# Patient Record
Sex: Male | Born: 1945 | Race: White | Hispanic: No | Marital: Married | Smoking: Former smoker
Health system: Southern US, Community
[De-identification: ages and names within clinical notes are randomized; demographics above are authoritative.]

## PROBLEM LIST (undated history)

## (undated) DIAGNOSIS — Z87898 Personal history of other specified conditions: Secondary | ICD-10-CM

## (undated) DIAGNOSIS — F419 Anxiety disorder, unspecified: Secondary | ICD-10-CM

## (undated) DIAGNOSIS — Z9889 Other specified postprocedural states: Secondary | ICD-10-CM

## (undated) DIAGNOSIS — K219 Gastro-esophageal reflux disease without esophagitis: Secondary | ICD-10-CM

## (undated) DIAGNOSIS — I480 Paroxysmal atrial fibrillation: Secondary | ICD-10-CM

## (undated) DIAGNOSIS — I10 Essential (primary) hypertension: Secondary | ICD-10-CM

## (undated) DIAGNOSIS — R296 Repeated falls: Secondary | ICD-10-CM

## (undated) DIAGNOSIS — E119 Type 2 diabetes mellitus without complications: Secondary | ICD-10-CM

## (undated) DIAGNOSIS — E786 Lipoprotein deficiency: Secondary | ICD-10-CM

## (undated) DIAGNOSIS — N529 Male erectile dysfunction, unspecified: Secondary | ICD-10-CM

## (undated) DIAGNOSIS — T7840XA Allergy, unspecified, initial encounter: Secondary | ICD-10-CM

## (undated) DIAGNOSIS — R519 Headache, unspecified: Secondary | ICD-10-CM

## (undated) DIAGNOSIS — Z9221 Personal history of antineoplastic chemotherapy: Secondary | ICD-10-CM

## (undated) DIAGNOSIS — R718 Other abnormality of red blood cells: Secondary | ICD-10-CM

## (undated) DIAGNOSIS — E559 Vitamin D deficiency, unspecified: Secondary | ICD-10-CM

## (undated) DIAGNOSIS — E2740 Unspecified adrenocortical insufficiency: Secondary | ICD-10-CM

## (undated) DIAGNOSIS — Z9989 Dependence on other enabling machines and devices: Secondary | ICD-10-CM

## (undated) DIAGNOSIS — E271 Primary adrenocortical insufficiency: Secondary | ICD-10-CM

## (undated) DIAGNOSIS — G4730 Sleep apnea, unspecified: Secondary | ICD-10-CM

## (undated) DIAGNOSIS — H25019 Cortical age-related cataract, unspecified eye: Secondary | ICD-10-CM

## (undated) DIAGNOSIS — E781 Pure hyperglyceridemia: Secondary | ICD-10-CM

## (undated) DIAGNOSIS — H9190 Unspecified hearing loss, unspecified ear: Secondary | ICD-10-CM

## (undated) DIAGNOSIS — C649 Malignant neoplasm of unspecified kidney, except renal pelvis: Secondary | ICD-10-CM

## (undated) DIAGNOSIS — I499 Cardiac arrhythmia, unspecified: Secondary | ICD-10-CM

## (undated) DIAGNOSIS — E291 Testicular hypofunction: Secondary | ICD-10-CM

## (undated) DIAGNOSIS — Z8639 Personal history of other endocrine, nutritional and metabolic disease: Secondary | ICD-10-CM

## (undated) HISTORY — DX: Cardiac arrhythmia, unspecified: I49.9

## (undated) HISTORY — DX: Anxiety disorder, unspecified: F41.9

## (undated) HISTORY — DX: Personal history of other endocrine, nutritional and metabolic disease: Z86.39

## (undated) HISTORY — PX: THYROIDECTOMY, PARTIAL: SHX18

## (undated) HISTORY — DX: Sleep apnea, unspecified: G47.30

## (undated) HISTORY — DX: Gastro-esophageal reflux disease without esophagitis: K21.9

## (undated) HISTORY — DX: Lipoprotein deficiency: E78.6

## (undated) HISTORY — DX: Repeated falls: R29.6

## (undated) HISTORY — PX: TONSILLECTOMY: SUR1361

## (undated) HISTORY — DX: Type 2 diabetes mellitus without complications: E11.9

## (undated) HISTORY — DX: Malignant neoplasm of unspecified kidney, except renal pelvis: C64.9

## (undated) HISTORY — DX: Allergy, unspecified, initial encounter: T78.40XA

## (undated) HISTORY — DX: Essential (primary) hypertension: I10

## (undated) HISTORY — DX: Dependence on other enabling machines and devices: Z99.89

## (undated) HISTORY — PX: ADRENALECTOMY: SHX876

## (undated) HISTORY — DX: Personal history of antineoplastic chemotherapy: Z92.21

## (undated) HISTORY — DX: Vitamin D deficiency, unspecified: E55.9

## (undated) HISTORY — PX: COLONOSCOPY W/ BIOPSIES: SHX1374

## (undated) HISTORY — DX: Primary adrenocortical insufficiency: E27.1

## (undated) HISTORY — DX: Other abnormality of red blood cells: R71.8

## (undated) HISTORY — DX: Paroxysmal atrial fibrillation: I48.0

## (undated) HISTORY — DX: Other specified postprocedural states: Z98.890

## (undated) HISTORY — PX: CYSTECTOMY: SUR359

## (undated) HISTORY — DX: Pure hyperglyceridemia: E78.1

## (undated) HISTORY — DX: Male erectile dysfunction, unspecified: N52.9

## (undated) HISTORY — DX: Unspecified adrenocortical insufficiency: E27.40

## (undated) HISTORY — DX: Testicular hypofunction: E29.1

## (undated) HISTORY — DX: Unspecified hearing loss, unspecified ear: H91.90

## (undated) HISTORY — DX: Cortical age-related cataract, unspecified eye: H25.019

## (undated) HISTORY — DX: Personal history of other specified conditions: Z87.898

## (undated) HISTORY — PX: POPLITEAL SYNOVIAL CYST EXCISION: SUR555

## (undated) HISTORY — PX: NEPHRECTOMY: SHX65

## (undated) HISTORY — PX: THYROIDECTOMY: SHX17

---

## 1987-10-02 HISTORY — PX: SPINE SURGERY: SHX786

## 2010-11-10 DIAGNOSIS — C7490 Malignant neoplasm of unspecified part of unspecified adrenal gland: Secondary | ICD-10-CM

## 2010-11-10 HISTORY — DX: Malignant neoplasm of unspecified part of unspecified adrenal gland: C74.90

## 2010-11-30 DIAGNOSIS — E039 Hypothyroidism, unspecified: Secondary | ICD-10-CM | POA: Insufficient documentation

## 2011-11-13 DIAGNOSIS — N183 Chronic kidney disease, stage 3 unspecified: Secondary | ICD-10-CM

## 2011-11-13 HISTORY — DX: Chronic kidney disease, stage 3 unspecified: N18.30

## 2013-09-04 DIAGNOSIS — E782 Mixed hyperlipidemia: Secondary | ICD-10-CM

## 2013-09-04 DIAGNOSIS — M109 Gout, unspecified: Secondary | ICD-10-CM

## 2013-09-04 HISTORY — DX: Gout, unspecified: M10.9

## 2013-09-04 HISTORY — DX: Mixed hyperlipidemia: E78.2

## 2013-10-15 DIAGNOSIS — R054 Cough syncope: Secondary | ICD-10-CM

## 2013-10-15 DIAGNOSIS — J069 Acute upper respiratory infection, unspecified: Secondary | ICD-10-CM | POA: Insufficient documentation

## 2013-10-15 DIAGNOSIS — Z8709 Personal history of other diseases of the respiratory system: Secondary | ICD-10-CM

## 2013-10-15 DIAGNOSIS — R55 Syncope and collapse: Secondary | ICD-10-CM

## 2013-10-15 HISTORY — DX: Syncope and collapse: R55

## 2013-10-15 HISTORY — DX: Personal history of other diseases of the respiratory system: Z87.09

## 2013-10-15 HISTORY — DX: Cough syncope: R05.4

## 2014-03-29 DIAGNOSIS — R531 Weakness: Secondary | ICD-10-CM

## 2014-03-29 DIAGNOSIS — R059 Cough, unspecified: Secondary | ICD-10-CM

## 2014-03-29 DIAGNOSIS — R509 Fever, unspecified: Secondary | ICD-10-CM

## 2014-03-29 HISTORY — DX: Cough, unspecified: R05.9

## 2014-03-29 HISTORY — DX: Weakness: R53.1

## 2014-03-29 HISTORY — DX: Fever, unspecified: R50.9

## 2014-04-14 DIAGNOSIS — E039 Hypothyroidism, unspecified: Secondary | ICD-10-CM

## 2014-04-14 HISTORY — DX: Hypothyroidism, unspecified: E03.9

## 2014-04-19 DIAGNOSIS — E272 Addisonian crisis: Secondary | ICD-10-CM | POA: Insufficient documentation

## 2014-05-17 DIAGNOSIS — Z8639 Personal history of other endocrine, nutritional and metabolic disease: Secondary | ICD-10-CM

## 2014-05-17 HISTORY — DX: Personal history of other endocrine, nutritional and metabolic disease: Z86.39

## 2014-06-16 DIAGNOSIS — E785 Hyperlipidemia, unspecified: Secondary | ICD-10-CM

## 2014-06-16 HISTORY — DX: Hyperlipidemia, unspecified: E78.5

## 2014-10-01 HISTORY — PX: CATARACT EXTRACTION: SUR2

## 2014-10-20 DIAGNOSIS — I4891 Unspecified atrial fibrillation: Secondary | ICD-10-CM | POA: Insufficient documentation

## 2014-10-20 DIAGNOSIS — I499 Cardiac arrhythmia, unspecified: Secondary | ICD-10-CM

## 2014-10-20 HISTORY — DX: Cardiac arrhythmia, unspecified: I49.9

## 2015-06-16 DIAGNOSIS — C649 Malignant neoplasm of unspecified kidney, except renal pelvis: Secondary | ICD-10-CM

## 2015-06-16 HISTORY — DX: Malignant neoplasm of unspecified kidney, except renal pelvis: C64.9

## 2015-10-04 DIAGNOSIS — C7802 Secondary malignant neoplasm of left lung: Secondary | ICD-10-CM | POA: Insufficient documentation

## 2015-10-04 DIAGNOSIS — Z859 Personal history of malignant neoplasm, unspecified: Secondary | ICD-10-CM

## 2015-10-04 HISTORY — DX: Personal history of malignant neoplasm, unspecified: Z85.9

## 2016-07-24 DIAGNOSIS — G4733 Obstructive sleep apnea (adult) (pediatric): Secondary | ICD-10-CM | POA: Insufficient documentation

## 2016-07-24 DIAGNOSIS — G2581 Restless legs syndrome: Secondary | ICD-10-CM

## 2016-07-24 HISTORY — DX: Obstructive sleep apnea (adult) (pediatric): G47.33

## 2016-07-24 HISTORY — DX: Restless legs syndrome: G25.81

## 2016-11-05 HISTORY — PX: CATARACT EXTRACTION EXTRACAPSULAR: SHX1305

## 2016-11-23 DIAGNOSIS — M7989 Other specified soft tissue disorders: Secondary | ICD-10-CM

## 2016-11-23 HISTORY — DX: Other specified soft tissue disorders: M79.89

## 2017-03-07 DIAGNOSIS — M1A00X0 Idiopathic chronic gout, unspecified site, without tophus (tophi): Secondary | ICD-10-CM | POA: Insufficient documentation

## 2017-03-07 DIAGNOSIS — R600 Localized edema: Secondary | ICD-10-CM

## 2017-03-07 DIAGNOSIS — Z8679 Personal history of other diseases of the circulatory system: Secondary | ICD-10-CM

## 2017-03-07 HISTORY — DX: Localized edema: R60.0

## 2017-03-07 HISTORY — DX: Idiopathic chronic gout, unspecified site, without tophus (tophi): M1A.00X0

## 2017-03-07 HISTORY — DX: Personal history of other diseases of the circulatory system: Z86.79

## 2017-05-06 HISTORY — PX: COLONOSCOPY: SHX174

## 2017-10-28 DIAGNOSIS — D369 Benign neoplasm, unspecified site: Secondary | ICD-10-CM

## 2017-10-28 HISTORY — DX: Benign neoplasm, unspecified site: D36.9

## 2018-01-06 DIAGNOSIS — Z872 Personal history of diseases of the skin and subcutaneous tissue: Secondary | ICD-10-CM | POA: Insufficient documentation

## 2018-01-06 HISTORY — DX: Personal history of diseases of the skin and subcutaneous tissue: Z87.2

## 2018-08-01 DIAGNOSIS — K8689 Other specified diseases of pancreas: Secondary | ICD-10-CM | POA: Insufficient documentation

## 2018-08-01 HISTORY — DX: Other specified diseases of pancreas: K86.89

## 2019-08-11 ENCOUNTER — Other Ambulatory Visit
Admission: RE | Admit: 2019-08-11 | Discharge: 2019-08-11 | Disposition: A | Discharge status: Home / Self Care | Payer: Medicare Other | Source: Ambulatory Visit | Attending: Neurology | Admitting: Neurology

## 2019-08-11 ENCOUNTER — Other Ambulatory Visit: Payer: Self-pay

## 2019-08-11 DIAGNOSIS — Z20828 Contact with and (suspected) exposure to other viral communicable diseases: Secondary | ICD-10-CM | POA: Insufficient documentation

## 2019-08-11 DIAGNOSIS — Z01812 Encounter for preprocedural laboratory examination: Secondary | ICD-10-CM | POA: Insufficient documentation

## 2019-08-11 LAB — SARS CORONAVIRUS 2 (TAT 6-24 HRS): SARS Coronavirus 2: NEGATIVE

## 2019-08-21 ENCOUNTER — Other Ambulatory Visit: Payer: Self-pay

## 2019-08-21 DIAGNOSIS — Z20822 Contact with and (suspected) exposure to covid-19: Secondary | ICD-10-CM

## 2019-08-23 LAB — NOVEL CORONAVIRUS, NAA: SARS-CoV-2, NAA: NOT DETECTED

## 2019-09-07 ENCOUNTER — Other Ambulatory Visit
Admission: RE | Admit: 2019-09-07 | Discharge: 2019-09-07 | Disposition: A | Discharge status: Home / Self Care | Payer: Medicare Other | Source: Ambulatory Visit | Attending: Neurology | Admitting: Neurology

## 2019-09-07 ENCOUNTER — Other Ambulatory Visit: Payer: Self-pay

## 2019-09-07 DIAGNOSIS — Z01812 Encounter for preprocedural laboratory examination: Secondary | ICD-10-CM | POA: Diagnosis present

## 2019-09-07 DIAGNOSIS — Z20828 Contact with and (suspected) exposure to other viral communicable diseases: Secondary | ICD-10-CM | POA: Insufficient documentation

## 2019-09-08 LAB — SARS CORONAVIRUS 2 (TAT 6-24 HRS): SARS Coronavirus 2: NEGATIVE

## 2019-09-10 ENCOUNTER — Ambulatory Visit: Attending: Neurology

## 2019-09-10 DIAGNOSIS — G4733 Obstructive sleep apnea (adult) (pediatric): Secondary | ICD-10-CM | POA: Insufficient documentation

## 2019-09-28 ENCOUNTER — Other Ambulatory Visit: Payer: Self-pay

## 2019-11-20 LAB — PSA: PSA: 1.26

## 2020-01-12 ENCOUNTER — Other Ambulatory Visit: Payer: Self-pay

## 2020-01-12 ENCOUNTER — Encounter: Payer: Self-pay | Admitting: Nurse Practitioner

## 2020-01-12 ENCOUNTER — Ambulatory Visit: Payer: Medicare Other | Admitting: Nurse Practitioner

## 2020-01-12 VITALS — BP 118/82 | HR 80 | Temp 97.6°F | Resp 16 | Ht 68.0 in | Wt 203.0 lb

## 2020-01-12 DIAGNOSIS — F419 Anxiety disorder, unspecified: Secondary | ICD-10-CM

## 2020-01-12 DIAGNOSIS — M109 Gout, unspecified: Secondary | ICD-10-CM

## 2020-01-12 DIAGNOSIS — H3530 Unspecified macular degeneration: Secondary | ICD-10-CM | POA: Diagnosis not present

## 2020-01-12 DIAGNOSIS — E1149 Type 2 diabetes mellitus with other diabetic neurological complication: Secondary | ICD-10-CM | POA: Diagnosis not present

## 2020-01-12 DIAGNOSIS — Z9989 Dependence on other enabling machines and devices: Secondary | ICD-10-CM

## 2020-01-12 DIAGNOSIS — K5904 Chronic idiopathic constipation: Secondary | ICD-10-CM

## 2020-01-12 DIAGNOSIS — K219 Gastro-esophageal reflux disease without esophagitis: Secondary | ICD-10-CM

## 2020-01-12 DIAGNOSIS — E2740 Unspecified adrenocortical insufficiency: Secondary | ICD-10-CM

## 2020-01-12 DIAGNOSIS — E559 Vitamin D deficiency, unspecified: Secondary | ICD-10-CM

## 2020-01-12 DIAGNOSIS — C642 Malignant neoplasm of left kidney, except renal pelvis: Secondary | ICD-10-CM

## 2020-01-12 DIAGNOSIS — G4733 Obstructive sleep apnea (adult) (pediatric): Secondary | ICD-10-CM

## 2020-01-12 DIAGNOSIS — E890 Postprocedural hypothyroidism: Secondary | ICD-10-CM

## 2020-01-12 DIAGNOSIS — E538 Deficiency of other specified B group vitamins: Secondary | ICD-10-CM

## 2020-01-12 NOTE — Progress Notes (Signed)
Careteam: Patient Care Team: [name], NP as PCP - General (Geriatric Medicine) [name], MD as Physician Assistant (Endocrinology) [name], MD as Referring Physician (Neurology)  Advanced Directive information Does Patient Have a Medical Advance Directive?: Yes, Type of Advance Directive: Out of facility DNR (pink MOST or yellow form);Living will;Healthcare Power of Attorney, Does patient want to make changes to medical advance directive?: No - Patient declined  Allergies  Allergen Reactions  . Other Rash    Blisters with bandaids Blisters with bandaids   . Soap Itching    Any Deodorant Soap.  . Tegaderm Ag Mesh [Silver]   . Tape Rash    Blisters, rash. Paper tape OK. Blisters, rash. Paper tape OK.     Chief Complaint  Patient presents with  . Establish Care    New Patient      HPI: Patient is a 74 y.o. male seen in today at the [facility] to establish care. He is working closely with hospice but would like to establish care at [facility] for convenience for any primary care needs.   Pt with hx of renal cancer found in 2011, also was in adrenal glands and 2012 then noted to have in lung (opted not to have surgery) and more recently found in pancrease (opted not to have surgery) - he had scans done but is not having anymore surgery Did chemo in 2011 and 2012 but did not tolerate it. When he decided to get on hospice he decided not to see oncologist so after the move to [facility] he no longer following with oncologist   Reports he now has a pain in his left arm and suspects this is due to mets to the bone. It is a  dull ache in arm (started after vaccine) takes tylenol when it bothers him- reports pain is all the time. Reports pain from shoulder down arm to elbow. Can press on the bone and it hurts. Taking tylenol 1000 mg TID.   Has been a part of hospice since prior to move in June 2020    VIt d Def- on Vit d 5000 units daily  Gout-  one flare this year, uses colchicine PRN only. 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 Also takes cortef alternate dosing.   Edema- taking lasix 20 mg PRN, uses rarely.  Neuropathy- using 900 mg qhs  OSA- uses CPAP followed by neurologist.   Hypothyroid, post surgery, on synthroid 75 mcg   Muscle cramps- taking magnesium which has been beneficial   DM- on metformin 500 mg daily. Blood sugars are controlled.  No hypoglycemia.   GERD- omeprazole- stopped this week.   Macular degeneration- advance in left eye. Uses preservision.   Constipation- controlled on psyllium   Hyperlipidemia- does not wish to monitor or treat at this time.    Review of Systems:  Review of Systems  Constitutional: Negative for chills, fever and malaise/fatigue.  HENT: Positive for hearing loss.   Eyes: Positive for blurred vision (macular degeneration).  Respiratory: Negative for cough, sputum production and shortness of breath.   Cardiovascular: Negative for chest pain, claudication and leg swelling.  Gastrointestinal: Negative for constipation, diarrhea and heartburn.  Genitourinary: Positive for urgency. Negative for dysuria and frequency.  Musculoskeletal: Positive for myalgias. Negative  for falls.  Skin: Negative.   Neurological: Positive for tingling, sensory change and weakness (uses cane). Negative for dizziness and headaches.  Psychiatric/Behavioral: Negative for depression. The patient is not nervous/anxious.     Past Medical History:  Diagnosis Date  . Abnormal heart rhythm 10/20/2014  . Addison's disease ([place])   . Adrenal insufficiency ([place])   . Allergy   . Ambulates with cane   . Anxiety   . Arrhythmia   . Cataract cortical, senile   . 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  . Chronic kidney  disease, stage 3 11/13/2011  . Cough 03/29/2014  . Edema leg 03/07/2017  . Elevated cholesterol with high triglycerides 09/04/2013  . Elevated red blood cell count   . Erectile dysfunction   . Falls frequently   . Fever 03/29/2014  . Generalized weakness 03/29/2014  . GERD (gastroesophageal reflux disease)   . Gout 09/04/2013  . H/O adrenal insufficiency 05/17/2014  . H/O atrial flutter 03/07/2017  . H/O atrial flutter   . H/O eye surgery   . H/O sebaceous cyst 01/06/2018  . H/O thyroid disease   . H/O upper respiratory infection 10/15/2013  . Hearing loss   . History of back surgery   . History of blepharoplasty   . History of cancer 10/04/2015  . History of chemotherapy   . History of esophagogastroduodenoscopy (EGD)   . History of prediabetes   . Hyperlipidemia 06/16/2014  . Hypertension   . Hypertriglyceridemia   . Hypothyroidism (acquired) 04/14/2014  . Left leg swelling 11/23/2016  . Low HDL (under 40)   . 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[place]) 11/10/2010  . Malignant neoplasm of unspecified kidney, except renal pelvis ([place])   . OSA (obstructive sleep apnea) 07/24/2016  . OSA (obstructive sleep apnea)   . PAF (paroxysmal atrial fibrillation) ([place])   . PAF (paroxysmal atrial fibrillation) ([place])   . Pancreatic mass 08/01/2018  . Renal carcinoma ([place]) 06/16/2015  . RLS (restless legs syndrome) 07/24/2016  . Sleep apnea   . Testicular hypofunction   . Tubular adenoma 10/28/2017  . Tussive syncopes 10/15/2013  . Type 2 diabetes mellitus ([place])   . Vitamin D deficiency disease    Past Surgical History:  Procedure Laterality Date  . ADRENALECTOMY    . CATARACT EXTRACTION  10/2014  . CATARACT EXTRACTION EXTRACAPSULAR  11/05/2016   with Insertion Intraocular Prostheis.   . COLONOSCOPY  05/06/2017   with removal lesions by snare.  . COLONOSCOPY W/ BIOPSIES     05/06/2017  . NEPHRECTOMY    . POPLITEAL SYNOVIAL CYST EXCISION    . [place]  .  THYROIDECTOMY    . THYROIDECTOMY, PARTIAL    . TONSILLECTOMY     Social History:   reports that he quit smoking about 43 years ago. His smoking use included cigarettes. He has a 10.00 pack-year smoking history. He does not have any smokeless tobacco history on file. He reports previous alcohol use. He reports that he does not use drugs.  Family History  Problem Relation Age of Onset  . Ovarian cancer Mother   . Cancer Mother   . Cancer Father   . Heart disease Father   . Coronary artery disease Father   . Hypertension Father   . Macular degeneration Father   . Heart disease Brother   . Diabetes Brother   . Coronary artery disease Brother   . Diabetes type II Brother   . Coronary artery disease Paternal Grandfather  Medications: Patient's Medications  New Prescriptions   No medications on file  Previous Medications   ACCU-CHEK SOFTCLIX LANCETS LANCETS    1 each by Other route daily. Use as instructed   BENZONATATE (TESSALON) 100 MG CAPSULE    Take 100 mg by mouth 3 (three) times daily as needed for cough.   BLOOD GLUCOSE METER KIT AND SUPPLIES    1 each by Other route as directed. Dispense based on patient and insurance preference. Use up to four times daily as directed. (FOR ICD-10 E10.9, E11.9).   CHOLECALCIFEROL 125 MCG (5000 UT) CAPSULE    Take 5,000 Units by mouth daily.   COLCHICINE 0.6 MG TABLET    Take 0.6 mg by mouth as needed. First sign of Gout Flare. Take 2 tablets ( 1.9m) by mouth at first sign of gout flare followed by 1 tablet ( 0.6759m after 1 hour. ( max 1.59m63mithin 1 hours)   CYANOCOBALAMIN ([employer] VITAMIN B12) 1000 MCG TABLET    Take 1,000 mcg by mouth every other day.   FLUDROCORTISONE ACETATE (FLORINEF PO)    Take 0.1 mg by mouth every other day. On alternate days take 1/2 tablet.   FUROSEMIDE (LASIX) 20 MG TABLET    Take 20 mg by mouth as needed. For Edema.   GABAPENTIN (NEURONTIN) 600 MG TABLET    Take 900 mg by mouth daily. Takes 1 and 1/2 tablet by mouth  nightly.   GLUCOSE BLOOD (ACCU-CHEK AVIVA PLUS) TEST STRIP    1 each by Other route daily. Use as instructed   HYDROCORTISONE (CORTEF) 10 MG TABLET    Take 10 mg by mouth daily. Takes 1 and 1/2 tablet every morning, 1/2 tablet every afternoon, and 1/2 tablet every evening. Take double dose as directed for stress, may take up to 85 tablets monthly.   LEVOTHYROXINE (SYNTHROID) 75 MCG TABLET    Take 75 mcg by mouth daily.   LORAZEPAM (ATIVAN) 0.5 MG TABLET    Take 0.5 mg by mouth every 8 (eight) hours.   MAGNESIUM 100 MG TABS    Take 100 mg by mouth at bedtime.   METFORMIN (GLUMETZA) 500 MG (MOD) 24 HR TABLET    Take 500 mg by mouth daily.   MULTIPLE VITAMINS-MINERALS (PRESERVISION AREDS 2 PO)    Take 1 capsule by mouth in the morning and at bedtime.   OMEPRAZOLE (PRILOSEC) 20 MG CAPSULE    Take 20 mg by mouth in the morning and at bedtime.   PSYLLIUM HUSK PO    Take 3.4 g by mouth every evening.  Modified Medications   No medications on file  Discontinued Medications   ALBUTEROL (VENTOLIN HFA) 108 (90 BASE) MCG/ACT INHALER    Inhale 2 puffs into the lungs every 6 (six) hours as needed for wheezing or shortness of breath.    Physical Exam:  Vitals:   01/12/20 0902  BP: 118/82  Pulse: 80  Resp: 16  Temp: 97.6 F (36.4 C)  SpO2: 96%  Weight: 203 lb (92.1 kg)  Height: '5\' 8"'  (1.727 m)   Body mass index is 30.87 kg/m. Wt Readings from Last 3 Encounters:  01/12/20 203 lb (92.1 kg)    Physical Exam Constitutional:      General: He is not in acute distress.    Appearance: He is well-developed. He is not diaphoretic.  HENT:     Head: Normocephalic and atraumatic.     Mouth/Throat:     Pharynx: No oropharyngeal exudate.  Eyes:  Conjunctiva/sclera: Conjunctivae normal.     Pupils: Pupils are equal, round, and reactive to light.  Cardiovascular:     Rate and Rhythm: Normal rate and regular rhythm.     Heart sounds: Normal heart sounds.  Pulmonary:     Effort: Pulmonary effort is  normal.     Breath sounds: Normal breath sounds.  Abdominal:     General: Bowel sounds are normal.     Palpations: Abdomen is soft.  Musculoskeletal:        General: No tenderness.     Cervical back: Normal range of motion and neck supple.  Skin:    General: Skin is warm and dry.  Neurological:     Mental Status: He is alert and oriented to person, place, and time.     Labs reviewed: Basic Metabolic Panel: No results for input(s): NA, K, CL, CO2, GLUCOSE, BUN, CREATININE, CALCIUM, MG, PHOS, TSH in the last 8760 hours. Liver Function Tests: No results for input(s): AST, ALT, ALKPHOS, BILITOT, PROT, ALBUMIN in the last 8760 hours. No results for input(s): LIPASE, AMYLASE in the last 8760 hours. No results for input(s): AMMONIA in the last 8760 hours. CBC: No results for input(s): WBC, NEUTROABS, HGB, HCT, MCV, PLT in the last 8760 hours. Lipid Panel: No results for input(s): CHOL, HDL, LDLCALC, TRIG, CHOLHDL, LDLDIRECT in the last 8760 hours. TSH: No results for input(s): TSH in the last 8760 hours. A1C: No results found for: HGBA1C   Assessment/Plan 1. Adrenal insufficiency (HCC) Due to bilateral adrenalectomy from cancer, following with endocrinologist for supplement  2. 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 No longer seeing an oncologist and followed by hospice at this time.   3. Type 2 diabetes mellitus with neurological complications ([place]) -followed by endocrinology, A1c well controled on on metformin. Continues on gabapenin due to neuropathy.  4. Macular degeneration of both eyes, unspecified type -continues with preservision. Advanced in left eye  5. Anxiety Had been prescribed lorazepam but only used for a short time. Now anxiety controlled and not needing any medication.   6. Postoperative hypothyroidism -TSH in normal range, followed by endocrinology, continues on levothyroxine.  7.  Gout, unspecified cause, unspecified chronicity, unspecified site Stable, diet controlled. Has PRN colchicine if needed.   8. OSA on CPAP Stable, Going to neurology for CPAP supplies.   9. Vitamin D deficiency Continues on vit d supplement.  10. Vitamin B12 deficiency Continues on b12 supplement.  11. Gastroesophageal reflux disease without esophagitis Has stopped omeprazole and without worsening of symptoms. Will monitor.   12. Chronic idiopathic constipation Controlled with OTC.   Next appt: 3 months, PRN Carlos American. [place], [facility] [phone]

## 2020-01-14 DIAGNOSIS — Z9181 History of falling: Secondary | ICD-10-CM | POA: Insufficient documentation

## 2020-01-14 DIAGNOSIS — G479 Sleep disorder, unspecified: Secondary | ICD-10-CM | POA: Insufficient documentation

## 2020-01-14 DIAGNOSIS — R262 Difficulty in walking, not elsewhere classified: Secondary | ICD-10-CM | POA: Insufficient documentation

## 2020-01-19 ENCOUNTER — Telehealth: Payer: Self-pay

## 2020-01-19 NOTE — Telephone Encounter (Signed)
Tony Huffman said thank you for being Tony Huffman' attendee and she will be sending you all of his notes.

## 2020-01-19 NOTE — Telephone Encounter (Signed)
Yes - thank you

## 2020-01-19 NOTE — Telephone Encounter (Signed)
Mr. Malvin Johns is in the [facility] and they Jan)  would like to know if you would  agree to be Mr. Borden attending NP .Marland Kitchen... Jan [phone]

## 2020-02-19 ENCOUNTER — Telehealth: Payer: Self-pay

## 2020-02-19 NOTE — Telephone Encounter (Signed)
Tony Huffman said she would have their physicians manage his pain meds.

## 2020-02-19 NOTE — Telephone Encounter (Signed)
I know hospice sometimes does pain management? Is this something they can manage on him? If not I will call in tramadol and have him follow up with me in clinic

## 2020-02-19 NOTE — Telephone Encounter (Signed)
Jan with [facility] [phone]) called about this patient. She stated that patient is having increased pain in his left arm that he feels is bony metastasis. She said he has maxed out on his daily Tylenol use and wants to know if you can prescribe tramadol or something else for pain.

## 2020-02-19 NOTE — Telephone Encounter (Signed)
Great - thanks

## 2020-04-26 ENCOUNTER — Encounter: Payer: Self-pay | Admitting: Nurse Practitioner

## 2020-04-26 ENCOUNTER — Ambulatory Visit: Payer: Medicare Other | Admitting: Nurse Practitioner

## 2020-04-26 ENCOUNTER — Other Ambulatory Visit: Payer: Self-pay

## 2020-04-26 VITALS — BP 100/70 | HR 76 | Temp 97.7°F | Ht 68.0 in | Wt 200.0 lb

## 2020-04-26 DIAGNOSIS — E2740 Unspecified adrenocortical insufficiency: Secondary | ICD-10-CM | POA: Diagnosis not present

## 2020-04-26 DIAGNOSIS — M109 Gout, unspecified: Secondary | ICD-10-CM

## 2020-04-26 DIAGNOSIS — Z9989 Dependence on other enabling machines and devices: Secondary | ICD-10-CM

## 2020-04-26 DIAGNOSIS — E1149 Type 2 diabetes mellitus with other diabetic neurological complication: Secondary | ICD-10-CM

## 2020-04-26 DIAGNOSIS — F419 Anxiety disorder, unspecified: Secondary | ICD-10-CM | POA: Diagnosis not present

## 2020-04-26 DIAGNOSIS — G4733 Obstructive sleep apnea (adult) (pediatric): Secondary | ICD-10-CM

## 2020-04-26 DIAGNOSIS — E890 Postprocedural hypothyroidism: Secondary | ICD-10-CM

## 2020-04-26 DIAGNOSIS — C642 Malignant neoplasm of left kidney, except renal pelvis: Secondary | ICD-10-CM

## 2020-04-26 NOTE — Progress Notes (Signed)
[facility]: Patient Care Team: [name], NP as PCP - General (Geriatric Medicine) [name], MD as Physician Assistant (Endocrinology) [name], MD as Referring Physician (Neurology)  Advanced Directive information    Allergies  Allergen Reactions  . Other Rash    Blisters with bandaids Blisters with bandaids   . Soap Itching    Any Deodorant Soap.  . Tegaderm Ag Mesh [Silver]   . Tape Rash    Blisters, rash. Paper tape OK. Blisters, rash. Paper tape OK.     Chief Complaint  Patient presents with  . Follow-up    3 month follow up. Patient states right arm went numb about a week ago.Patient did not go to ER or urgent care. He states it seemed to get better immediately.  Marland Kitchen Best Practice Recommendations    Hep C screening  . Quality Metric Gaps    Colonscopy, Urine Microalbumin     HPI: Patient is a 74 y.o. male seen in today at the [facility] [employer].  Followed by hospice, seeing nurse every Friday.  Was started on oxy IR 5 mg every 6 hours as needed for pain. Pain mostly in his left arm. Always there. Pain 2/10 now. Dull ache. Taking stool soften and metamucil which are working well.   Reports right arm went numb and he is able to move it again.  He also had tingling on right side of face but smile was equal when he looked in the mirror and could swallow okay. No slurred speech.  He did not want to go to the hospital because did not want a bunch of test done.   No recent gout flares.  Endocrine following adrenal insuffiencey, thyroid, and diabetes- pt reports recent labs and A1c at goal. He was denies test stripes due to being on hospice. Seeing endocrinologist every 4 months. Seeing neurologist for  cpap soon  OSA using CPAP.   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  Review of Systems:  Review of Systems  Constitutional: Negative for chills, fever, malaise/fatigue and weight loss.  HENT: Negative for  tinnitus.   Respiratory: Negative for cough, sputum production and shortness of breath.   Cardiovascular: Negative for chest pain, palpitations and leg swelling.  Gastrointestinal: Negative for abdominal pain, constipation, diarrhea and heartburn.  Genitourinary: Negative for dysuria, frequency and urgency.  Musculoskeletal: Positive for falls (uses rollator at night to avoid falls) and myalgias. Negative for back pain and joint pain.  Skin: Negative.   Neurological: Negative for dizziness, sensory change and headaches.  Psychiatric/Behavioral: Negative for depression and memory loss. The patient does not have insomnia.     Past Medical History:  Diagnosis Date  . Abnormal heart rhythm 10/20/2014  . Addison's disease ([place])   . Adrenal insufficiency ([place])   . Allergy   . Ambulates with cane   . Anxiety   . Arrhythmia   . Cataract cortical, senile   . 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  . Chronic kidney disease, stage 3 11/13/2011  . Cough 03/29/2014  . Edema leg 03/07/2017  . Elevated cholesterol with high triglycerides 09/04/2013  . Elevated red blood cell count   . Erectile dysfunction   . Falls frequently   . Fever 03/29/2014  . Generalized weakness 03/29/2014  . GERD (gastroesophageal reflux disease)   . Gout 09/04/2013  . H/O adrenal insufficiency 05/17/2014  . H/O atrial flutter 03/07/2017  . H/O atrial flutter   .  H/O eye surgery   . H/O sebaceous cyst 01/06/2018  . H/O thyroid disease   . H/O upper respiratory infection 10/15/2013  . Hearing loss   . History of back surgery   . History of blepharoplasty   . History of cancer 10/04/2015  . History of chemotherapy   . History of esophagogastroduodenoscopy (EGD)   . History of prediabetes   . Hyperlipidemia 06/16/2014  . Hypertension   . Hypertriglyceridemia   . Hypothyroidism (acquired) 04/14/2014  . Left leg swelling 11/23/2016  . Low HDL (under 40)   . 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[place]) 11/10/2010    . Malignant neoplasm of unspecified kidney, except renal pelvis ([place])   . OSA (obstructive sleep apnea) 07/24/2016  . OSA (obstructive sleep apnea)   . PAF (paroxysmal atrial fibrillation) ([place])   . PAF (paroxysmal atrial fibrillation) ([place])   . Pancreatic mass 08/01/2018  . Renal carcinoma ([place]) 06/16/2015  . RLS (restless legs syndrome) 07/24/2016  . Sleep apnea   . Testicular hypofunction   . Tubular adenoma 10/28/2017  . Tussive syncopes 10/15/2013  . Type 2 diabetes mellitus ([place])   . Vitamin D deficiency disease    Past Surgical History:  Procedure Laterality Date  . ADRENALECTOMY    . CATARACT EXTRACTION  10/2014  . CATARACT EXTRACTION EXTRACAPSULAR  11/05/2016   with Insertion Intraocular Prostheis.   . COLONOSCOPY  05/06/2017   with removal lesions by snare.  . COLONOSCOPY W/ BIOPSIES     05/06/2017  . NEPHRECTOMY    . POPLITEAL SYNOVIAL CYST EXCISION    . [place]  . THYROIDECTOMY    . THYROIDECTOMY, PARTIAL    . TONSILLECTOMY     Social History:   reports that he quit smoking about 43 years ago. His smoking use included cigarettes. He has a 10.00 pack-year smoking history. He has never used smokeless tobacco. He reports previous alcohol use. He reports that he does not use drugs.  Family History  Problem Relation Age of Onset  . Ovarian cancer Mother   . Cancer Mother   . Cancer Father   . Heart disease Father   . Coronary artery disease Father   . Hypertension Father   . Macular degeneration Father   . Heart disease Brother   . Diabetes Brother   . Coronary artery disease Brother   . Diabetes type II Brother   . Coronary artery disease Paternal Grandfather     Medications: Patient's Medications  New Prescriptions   No medications on file  Previous Medications   ACCU-CHEK SOFTCLIX LANCETS LANCETS    1 each by Other route daily. Use as instructed   BLOOD GLUCOSE METER KIT AND SUPPLIES    1 each by Other route as directed. Dispense based on  patient and insurance preference. Use up to four times daily as directed. (FOR ICD-10 E10.9, E11.9).   CHOLECALCIFEROL 125 MCG (5000 UT) CAPSULE    Take 5,000 Units by mouth daily.   COLCHICINE 0.6 MG TABLET    Take 0.6 mg by mouth as needed. First sign of Gout Flare. Take 2 tablets ( 1.34m) by mouth at first sign of gout flare followed by 1 tablet ( 0.650m after 1 hour. ( max 1.85m47mithin 1 hours)   CYANOCOBALAMIN ([employer]S VITAMIN B12) 1000 MCG TABLET    Take 1,000 mcg by mouth every other day.   FLUDROCORTISONE ACETATE (FLORINEF PO)    Take 0.1 mg by mouth every other day. On alternate  days take 1/2 tablet.   FUROSEMIDE (LASIX) 20 MG TABLET    Take 20 mg by mouth as needed. For Edema.   GABAPENTIN (NEURONTIN) 600 MG TABLET    Take 900 mg by mouth daily. Takes 1 and 1/2 tablet by mouth nightly.   GLUCOSE BLOOD (ACCU-CHEK AVIVA PLUS) TEST STRIP    1 each by Other route daily. Use as instructed   HYDROCORTISONE (CORTEF) 10 MG TABLET    Take 10 mg by mouth daily. Takes 1 and 1/2 tablet every morning, 1/2 tablet every afternoon, and 1/2 tablet every evening. Take double dose as directed for stress, may take up to 85 tablets monthly.   LEVOTHYROXINE (SYNTHROID) 75 MCG TABLET    Take 75 mcg by mouth daily.   MAGNESIUM 100 MG TABS    Take 100 mg by mouth at bedtime.   METFORMIN (GLUMETZA) 500 MG (MOD) 24 HR TABLET    Take 500 mg by mouth daily.   MULTIPLE VITAMINS-MINERALS (PRESERVISION AREDS 2 PO)    Take 1 capsule by mouth in the morning and at bedtime.   OXYCODONE (OXY IR/ROXICODONE) 5 MG IMMEDIATE RELEASE TABLET    Take 5 mg by mouth every 6 (six) hours as needed for severe pain. PRN pain   PSYLLIUM HUSK PO    Take 3.4 g by mouth every evening.  Modified Medications   No medications on file  Discontinued Medications   No medications on file    Physical Exam:  Vitals:   04/26/20 0955  BP: 100/70  Pulse: 76  Temp: 97.7 F (36.5 C)  SpO2: 96%  Weight: 200 lb (90.7 kg)  Height: 5' 8" (1.727 m)     Body mass index is 30.87 kg/m. Wt Readings from Last 3 Encounters:  01/12/20 203 lb (92.1 kg)    Physical Exam Constitutional:      General: He is not in acute distress.    Appearance: He is well-developed. He is not diaphoretic.  HENT:     Head: Normocephalic and atraumatic.     Mouth/Throat:     Pharynx: No oropharyngeal exudate.  Eyes:     Conjunctiva/sclera: Conjunctivae normal.     Pupils: Pupils are equal, round, and reactive to light.  Cardiovascular:     Rate and Rhythm: Normal rate and regular rhythm.     Heart sounds: Normal heart sounds.  Pulmonary:     Effort: Pulmonary effort is normal.     Breath sounds: Normal breath sounds.  Abdominal:     General: Bowel sounds are normal.     Palpations: Abdomen is soft.  Musculoskeletal:        General: No tenderness.     Cervical back: Normal range of motion and neck supple.  Skin:    General: Skin is warm and dry.  Neurological:     Mental Status: He is alert and oriented to person, place, and time.  Psychiatric:        Mood and Affect: Mood normal.        Behavior: Behavior normal.     Labs reviewed: Basic Metabolic Panel: No results for input(s): NA, K, CL, CO2, GLUCOSE, BUN, CREATININE, CALCIUM, MG, PHOS, TSH in the last 8760 hours. Liver Function Tests: No results for input(s): AST, ALT, ALKPHOS, BILITOT, PROT, ALBUMIN in the last 8760 hours. No results for input(s): LIPASE, AMYLASE in the last 8760 hours. No results for input(s): AMMONIA in the last 8760 hours. CBC: No results for input(s): WBC, NEUTROABS, HGB, HCT,  MCV, PLT in the last 8760 hours. Lipid Panel: No results for input(s): CHOL, HDL, LDLCALC, TRIG, CHOLHDL, LDLDIRECT in the last 8760 hours. TSH: No results for input(s): TSH in the last 8760 hours. A1C: No results found for: HGBA1C   Assessment/Plan 1. Adrenal insufficiency ([place]) Followed closely with endocrinology, continues on hydrocortisone and fludrocortisone.  2. Renal cell  carcinoma of left kidney metastatic to other site [facility]) With mets, lungs clear today without worsening cough, congestion or shortness of breath. Suspects he has mets to the bone due to pain in left arm. This has been stable since January. Oxy IR prescribed by hospice and doing well with pain control. Continues to be monitored weekly by hospice.   3. Type 2 diabetes mellitus with neurological complications (HCC) Stable continues on metformin.  4. Anxiety No increase in anxiety or depression at this time.  5. OSA on CPAP Stable, continues to follow up with neurology on cpap  6. Postoperative hypothyroidism -cont on synthroid, TSH being monitored by endocrinology.  7. Gout, unspecified cause, unspecified chronicity, unspecified site No recent flare.   Next appt: 1 year Carlos American. [place], [facility] [phone]

## 2020-05-31 ENCOUNTER — Ambulatory Visit: Payer: Medicare Other | Admitting: Nurse Practitioner

## 2020-05-31 ENCOUNTER — Encounter: Payer: Self-pay | Admitting: Nurse Practitioner

## 2020-05-31 ENCOUNTER — Ambulatory Visit: Payer: Medicare Other | Admitting: Family

## 2020-05-31 ENCOUNTER — Other Ambulatory Visit: Payer: Self-pay

## 2020-05-31 VITALS — BP 110/90 | HR 84 | Temp 97.7°F | Ht 68.0 in

## 2020-05-31 DIAGNOSIS — M79662 Pain in left lower leg: Secondary | ICD-10-CM | POA: Diagnosis not present

## 2020-05-31 DIAGNOSIS — L03116 Cellulitis of left lower limb: Secondary | ICD-10-CM | POA: Diagnosis not present

## 2020-05-31 DIAGNOSIS — M7989 Other specified soft tissue disorders: Secondary | ICD-10-CM

## 2020-05-31 MED ORDER — SACCHAROMYCES BOULARDII 250 MG PO CAPS: 250.0000 mg | ORAL_CAPSULE | Freq: Two times a day (BID) | ORAL | 0 refills | Status: DC

## 2020-05-31 MED ORDER — DOXYCYCLINE HYCLATE 100 MG PO TABS: 100.0000 mg | ORAL_TABLET | Freq: Two times a day (BID) | ORAL | 0 refills | Status: DC

## 2020-05-31 NOTE — Patient Instructions (Addendum)
To start doxycycline 100 mg by mouth twice daily for 7 days To use probiotic (florastor) twice daily  To use walker vs cane  Ultrasound order to evaluate for possible DVT

## 2020-05-31 NOTE — Progress Notes (Signed)
Careteam: Patient Care Team: [name], NP as PCP - General (Geriatric Medicine) [name], MD as Physician Assistant (Endocrinology) [name], MD as Referring Physician (Neurology)  Advanced Directive information    Allergies  Allergen Reactions  . Other Rash    Blisters with bandaids Blisters with bandaids   . Soap Itching    Any Deodorant Soap.  . Tegaderm Ag Mesh [Silver]   . Tape Rash    Blisters, rash. Paper tape OK. Blisters, rash. Paper tape OK.     Chief Complaint  Patient presents with  . Acute Visit    Patient having pain in left foot achilles, bottom of foot and toes. Redness and swelling in foot. Started Saturday morning in calf of leg. Warm feeling in back of ankle. Patient has taking Tylenol and Oxycodone for pain. They both seemed to help. Took Oxycodone this morning because of severity of pain.Walking pain level an 8 when area is touched pain level is ten.Pain level sitting and relaxing is about a three. Walking exacerbates and touching exacerbates the pain the most.     HPI: Patient is a 74 y.o. male seen in today at the [facility] for pain and swelling of left ankle. 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  4 days ago pt reports eports pain which started started in calf and moved down to ankle. Yesterday was very tender in calf and now tender to ankle. For 4 days leg, ankle and foot swollen. Hard to walk due to pain. Painful also on the top of his foot with redness and swelling. Every day getting worse. Was taking tylenol now taking oxycodone for pain. Does not take pain away but makes it more bearable. Having a hard time straighten up foot to walk.  Hx of gout but "does not feel like a gout flare" Having chills but no fever (also been taking tylenol) No shortness of breath or chest pains.   Reports last week got his COVID  booster, went into adrenal crisis so increased his hydrocortisone and this resolved. Had fever with covid booster.     Review of Systems:  Review of Systems  Constitutional: Positive for chills and malaise/fatigue. Negative for fever and weight loss.  Respiratory: Negative for cough, sputum production and shortness of breath.   Cardiovascular: Positive for leg swelling. Negative for chest pain and palpitations.  Gastrointestinal: Negative for abdominal pain, constipation, diarrhea and heartburn.  Genitourinary: Negative for dysuria, frequency and urgency.  Musculoskeletal: Positive for joint pain and myalgias. Negative for back pain and falls.  Skin:       Redness noted to left lower extremity   Neurological: Negative for dizziness and headaches.    Past Medical History:  Diagnosis Date  . Abnormal heart rhythm 10/20/2014  . Addison's disease ([place])   . Adrenal insufficiency ([place])   . Allergy   . Ambulates with cane   . Anxiety   . Arrhythmia   . Cataract cortical, senile   . 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  . Chronic kidney disease, stage 3 11/13/2011  . Cough 03/29/2014  . Edema leg 03/07/2017  . Elevated cholesterol with high triglycerides 09/04/2013  . Elevated red blood cell count   . Erectile dysfunction   . Falls frequently   . Fever 03/29/2014  . Generalized weakness 03/29/2014  . GERD (gastroesophageal reflux disease)   . Gout 09/04/2013  .  H/O adrenal insufficiency 05/17/2014  . H/O atrial flutter 03/07/2017  . H/O atrial flutter   . H/O eye surgery   . H/O sebaceous cyst 01/06/2018  . H/O thyroid disease   . H/O upper respiratory infection 10/15/2013  . Hearing loss   . History of back surgery   . History of blepharoplasty   . History of cancer 10/04/2015  . History of chemotherapy   . History of esophagogastroduodenoscopy (EGD)   . History of prediabetes   . Hyperlipidemia 06/16/2014  . Hypertension   . Hypertriglyceridemia   . Hypothyroidism  (acquired) 04/14/2014  . Left leg swelling 11/23/2016  . Low HDL (under 40)   . 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[place]) 11/10/2010  . Malignant neoplasm of unspecified kidney, except renal pelvis ([place])   . OSA (obstructive sleep apnea) 07/24/2016  . OSA (obstructive sleep apnea)   . PAF (paroxysmal atrial fibrillation) ([place])   . PAF (paroxysmal atrial fibrillation) ([place])   . Pancreatic mass 08/01/2018  . Renal carcinoma ([place]) 06/16/2015  . RLS (restless legs syndrome) 07/24/2016  . Sleep apnea   . Testicular hypofunction   . Tubular adenoma 10/28/2017  . Tussive syncopes 10/15/2013  . Type 2 diabetes mellitus ([place])   . Vitamin D deficiency disease    Past Surgical History:  Procedure Laterality Date  . ADRENALECTOMY    . CATARACT EXTRACTION  10/2014  . CATARACT EXTRACTION EXTRACAPSULAR  11/05/2016   with Insertion Intraocular Prostheis.   . COLONOSCOPY  05/06/2017   with removal lesions by snare.  . COLONOSCOPY W/ BIOPSIES     05/06/2017  . NEPHRECTOMY    . POPLITEAL SYNOVIAL CYST EXCISION    . [place]  . THYROIDECTOMY    . THYROIDECTOMY, PARTIAL    . TONSILLECTOMY     Social History:   reports that he quit smoking about 43 years ago. His smoking use included cigarettes. He has a 10.00 pack-year smoking history. He has never used smokeless tobacco. He reports previous alcohol use. He reports that he does not use drugs.  Family History  Problem Relation Age of Onset  . Ovarian cancer Mother   . Cancer Mother   . Cancer Father   . Heart disease Father   . Coronary artery disease Father   . Hypertension Father   . Macular degeneration Father   . Heart disease Brother   . Diabetes Brother   . Coronary artery disease Brother   . Diabetes type II Brother   . Coronary artery disease Paternal Grandfather     Medications: Patient's Medications  New Prescriptions   No medications on file  Previous Medications   ACCU-CHEK SOFTCLIX LANCETS LANCETS    1 each  by Other route daily. Use as instructed   BLOOD GLUCOSE METER KIT AND SUPPLIES    1 each by Other route as directed. Dispense based on patient and insurance preference. Use up to four times daily as directed. (FOR ICD-10 E10.9, E11.9).   CHOLECALCIFEROL 125 MCG (5000 UT) CAPSULE    Take 5,000 Units by mouth daily.   COLCHICINE 0.6 MG TABLET    Take 0.6 mg by mouth as needed. First sign of Gout Flare. Take 2 tablets ( 1.$RemoveBef'2mg'vRGgCZeMWV$ ) by mouth at first sign of gout flare followed by 1 tablet ( 0.$RemoveBe'6mg'vILpSiDFA$ ) after 1 hour. ( max 1.$Remov'8mg'cDWzBk$  within 1 hours)   CYANOCOBALAMIN ([employer] VITAMIN B12) 1000 MCG TABLET    Take 1,000 mcg by mouth every other day.  FLUDROCORTISONE ACETATE (FLORINEF PO)    Take 0.1 mg by mouth every other day. On alternate days take 1/2 tablet.   FUROSEMIDE (LASIX) 20 MG TABLET    Take 20 mg by mouth as needed. For Edema.   GABAPENTIN (NEURONTIN) 600 MG TABLET    Take 900 mg by mouth daily. Takes 1 and 1/2 tablet by mouth nightly.   GLUCOSE BLOOD (ACCU-CHEK AVIVA PLUS) TEST STRIP    1 each by Other route daily. Use as instructed   HYDROCORTISONE (CORTEF) 10 MG TABLET    Take 10 mg by mouth daily. Takes 1 and 1/2 tablet every morning, 1/2 tablet every afternoon, and 1/2 tablet every evening. Take double dose as directed for stress, may take up to 85 tablets monthly.   LEVOTHYROXINE (SYNTHROID) 75 MCG TABLET    Take 75 mcg by mouth daily.   MAGNESIUM 100 MG TABS    Take 100 mg by mouth at bedtime.   METFORMIN (GLUMETZA) 500 MG (MOD) 24 HR TABLET    Take 500 mg by mouth daily.   MULTIPLE VITAMINS-MINERALS (PRESERVISION AREDS 2 PO)    Take 1 capsule by mouth in the morning and at bedtime.   OXYCODONE (OXY IR/ROXICODONE) 5 MG IMMEDIATE RELEASE TABLET    Take 5 mg by mouth every 6 (six) hours as needed for severe pain. PRN pain   PSYLLIUM HUSK PO    Take 3.4 g by mouth every evening.  Modified Medications   No medications on file  Discontinued Medications   FLUDROCORTISONE (FLORINEF) 0.1 MG TABLET         Physical Exam:  Vitals:   05/31/20 1529  BP: 110/90  Pulse: 84  Temp: 97.7 F (36.5 C)  TempSrc: Temporal  SpO2: 96%  Height: $Remove'5\' 8"'mNirLRn$  (1.727 m)   Body mass index is 30.41 kg/m. Wt Readings from Last 3 Encounters:  04/26/20 200 lb (90.7 kg)  01/12/20 203 lb (92.1 kg)    Physical Exam Constitutional:      General: He is not in acute distress.    Appearance: He is well-developed. He is not diaphoretic.  HENT:     Head: Normocephalic and atraumatic.     Mouth/Throat:     Pharynx: No oropharyngeal exudate.  Eyes:     Conjunctiva/sclera: Conjunctivae normal.     Pupils: Pupils are equal, round, and reactive to light.  Cardiovascular:     Rate and Rhythm: Normal rate and regular rhythm.     Heart sounds: Normal heart sounds.  Pulmonary:     Effort: Pulmonary effort is normal.     Breath sounds: Normal breath sounds.  Abdominal:     General: Bowel sounds are normal.     Palpations: Abdomen is soft.  Musculoskeletal:        General: Tenderness present.     Cervical back: Normal range of motion and neck supple.     Right lower leg: No edema.     Left lower leg: Edema present.     Left ankle: Swelling present. Tenderness present.     Left Achilles Tendon: Tenderness present.       Feet:     Comments: Redness, swelling, warmth and pain noted to left posterior ankle. Tendon appears tight and increased pain with movement Also swelling in left calf with redness.   Redness and increased tenderness to dorsal aspect of left foot.  Skin:    General: Skin is warm and dry.  Neurological:     Mental Status: He  is alert and oriented to person, place, and time.     Gait: Gait abnormal (unsteady).     Labs reviewed: Basic Metabolic Panel: No results for input(s): NA, K, CL, CO2, GLUCOSE, BUN, CREATININE, CALCIUM, MG, PHOS, TSH in the last 8760 hours. Liver Function Tests: No results for input(s): AST, ALT, ALKPHOS, BILITOT, PROT, ALBUMIN in the last 8760 hours. No results  for input(s): LIPASE, AMYLASE in the last 8760 hours. No results for input(s): AMMONIA in the last 8760 hours. CBC: No results for input(s): WBC, NEUTROABS, HGB, HCT, MCV, PLT in the last 8760 hours. Lipid Panel: No results for input(s): CHOL, HDL, LDLCALC, TRIG, CHOLHDL, LDLDIRECT in the last 8760 hours. TSH: No results for input(s): TSH in the last 8760 hours. A1C: No results found for: HGBA1C   Assessment/Plan 1. Pain and swelling of lower leg, left Due to his cancer he is at right risk for DVT with the pain in swelling in calf will send for ultrasound to rule out  - doxycycline (VIBRA-TABS) 100 MG tablet; Take 1 tablet (100 mg total) by mouth 2 (two) times daily.  Dispense: 14 tablet; Refill: 0 - VAS [place] LOWER EXTREMITY VENOUS (DVT)- STAT Unsteady gait due to pain and swelling, encouraged to use walker vs cane He also expressed he could get wheelchair from hospice if needed.   2. Cellulitis of left lower extremity Highly suspicious for cellulitis, unable to get labs today to follow up CBC -will treat with doxycycline 100 mg BID for 7 days, take with food and encouraged to call office if symptoms worsen or fail to improve after starting antibiotic, also to follow up with hospice nurse and TL nursing if needed  - doxycycline (VIBRA-TABS) 100 MG tablet; Take 1 tablet (100 mg total) by mouth 2 (two) times daily.  Dispense: 14 tablet; Refill: 0 - saccharomyces boulardii (FLORASTOR) 250 MG capsule; Take 1 capsule (250 mg total) by mouth 2 (two) times daily.  Dispense: 20 capsule; Refill: 0    Strict return precautions discussed  Roseana Rhine K. [place], [facility] [phone]

## 2020-06-01 ENCOUNTER — Encounter (INDEPENDENT_AMBULATORY_CARE_PROVIDER_SITE_OTHER): Payer: Medicare Other

## 2020-06-01 ENCOUNTER — Other Ambulatory Visit: Payer: Self-pay

## 2020-06-01 ENCOUNTER — Ambulatory Visit (INDEPENDENT_AMBULATORY_CARE_PROVIDER_SITE_OTHER): Payer: Medicare Other

## 2020-06-01 DIAGNOSIS — M7989 Other specified soft tissue disorders: Secondary | ICD-10-CM

## 2020-06-01 DIAGNOSIS — M79662 Pain in left lower leg: Secondary | ICD-10-CM | POA: Diagnosis not present

## 2020-06-02 ENCOUNTER — Encounter: Payer: Self-pay | Admitting: Nurse Practitioner

## 2020-06-02 NOTE — Telephone Encounter (Signed)
Message routed to covering provider [name], NP to review and reply directly back to the patient

## 2020-06-30 ENCOUNTER — Other Ambulatory Visit: Payer: Self-pay | Admitting: *Deleted

## 2020-06-30 ENCOUNTER — Telehealth: Payer: Self-pay | Admitting: Nurse Practitioner

## 2020-06-30 MED ORDER — ACCU-CHEK AVIVA PLUS VI STRP: ORAL_STRIP | 3 refills | Status: DC

## 2020-06-30 MED ORDER — ACCU-CHEK SOFTCLIX LANCETS MISC: 3 refills | Status: DC

## 2020-06-30 NOTE — Telephone Encounter (Signed)
Refills sent to pharmacy and nurse notified and agreed.

## 2020-06-30 NOTE — Telephone Encounter (Signed)
Hospice nurse case manager Tony Huffman ) called about Tony Huffman 07-18-1946 is out of his blood strips for his glucose monitor and he needs a specific kind for his machine and she was wondering if you could put in the order for his strips so he can have the exact kind he has. He will pick them up at his pharmacy and she said call her or him back to let them know please  [phone].

## 2020-06-30 NOTE — Telephone Encounter (Signed)
Hospice Nurse Case Manage, Wyvonnia Dusky called requesting refills for patient.  Faxed to Pharmacy and Nurse notified and agreed.

## 2020-09-08 ENCOUNTER — Encounter: Payer: Self-pay | Admitting: Nurse Practitioner

## 2020-09-08 ENCOUNTER — Ambulatory Visit: Payer: Medicare Other | Admitting: Nurse Practitioner

## 2020-09-08 ENCOUNTER — Other Ambulatory Visit: Payer: Self-pay

## 2020-09-08 VITALS — BP 110/80 | HR 82 | Temp 97.5°F | Ht 68.0 in | Wt 200.0 lb

## 2020-09-08 DIAGNOSIS — G4700 Insomnia, unspecified: Secondary | ICD-10-CM | POA: Diagnosis not present

## 2020-09-08 DIAGNOSIS — M79672 Pain in left foot: Secondary | ICD-10-CM | POA: Diagnosis not present

## 2020-09-08 NOTE — Progress Notes (Signed)
Careteam: Patient Care Team: [name], NP as PCP - General (Geriatric Medicine) [name], MD as Physician Assistant (Endocrinology) [name], MD as Referring Physician (Neurology)  Advanced Directive information    Allergies  Allergen Reactions  . Other Rash    Blisters with bandaids Blisters with bandaids   . Soap Itching    Any Deodorant Soap.  . Tegaderm Ag Mesh [Silver]   . Tape Rash    Blisters, rash. Paper tape OK. Blisters, rash. Paper tape OK.     Chief Complaint  Patient presents with  . Acute Visit    Patient complains of cellulitis on left foot. Patient states feels like it did the last time he had it. Patient states it started hurting about 3 to 4 days ago with pain behind toes now in his heel. Patient states heel is red on the back. No hotness to area. Heel area is tender. Pain has not tried anything for the pain. Patient does not seem to notice any swelling.     HPI: Patient is a 74 y.o. male seen in today at the [facility] for pain to left heal, concern over cellulitis due to having in the past.  Since chemo he will "skip" and has an altered gait. Will drag left food behind him. Reports no redness or heat but tenderness to the back of his heel that has been more notable over the last few days. No swelling noted.   Started on nortriptyline which has been helping him sleep. Prescribed by Dr Melrose Nakayama who is managing his CPAP.   Review of Systems:  Review of Systems  Constitutional: Negative for chills and fever.  Musculoskeletal: Negative for joint pain.       Tenderness to back of left heel. No heat, redness or swelling noted  Skin: Negative for itching and rash.    Past Medical History:  Diagnosis Date  . Abnormal heart rhythm 10/20/2014  . Addison's disease ([place])   . Adrenal insufficiency ([place])   . Allergy   . Ambulates with cane   . Anxiety   . Arrhythmia   . Cataract cortical, senile   . Chronic gouty arthritis  03/07/2017  . Chronic kidney disease, stage 3 ([place]) 11/13/2011  . Cough 03/29/2014  . Edema leg 03/07/2017  . Elevated cholesterol with high triglycerides 09/04/2013  . Elevated red blood cell count   . Erectile dysfunction   . Falls frequently   . Fever 03/29/2014  . Generalized weakness 03/29/2014  . GERD (gastroesophageal reflux disease)   . Gout 09/04/2013  . H/O adrenal insufficiency 05/17/2014  . H/O atrial flutter 03/07/2017  . H/O atrial flutter   . H/O eye surgery   . H/O sebaceous cyst 01/06/2018  . H/O thyroid disease   . H/O upper respiratory infection 10/15/2013  . Hearing loss   . History of back surgery   . History of blepharoplasty   . History of cancer 10/04/2015  . History of chemotherapy   . History of esophagogastroduodenoscopy (EGD)   . History of prediabetes   . Hyperlipidemia 06/16/2014  . Hypertension   . Hypertriglyceridemia   . Hypothyroidism (acquired) 04/14/2014  . Left leg swelling 11/23/2016  . Low HDL (under 40)   . 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[place]) 11/10/2010  . Malignant neoplasm of unspecified kidney, except renal pelvis ([place])   . OSA (obstructive sleep apnea) 07/24/2016  . OSA (obstructive sleep apnea)   . PAF (paroxysmal atrial fibrillation) (  [place])   . PAF (paroxysmal atrial fibrillation) ([place])   . Pancreatic mass 08/01/2018  . Renal carcinoma ([place]) 06/16/2015  . RLS (restless legs syndrome) 07/24/2016  . Sleep apnea   . Testicular hypofunction   . Tubular adenoma 10/28/2017  . Tussive syncopes 10/15/2013  . Type 2 diabetes mellitus ([place])   . Vitamin D deficiency disease    Past Surgical History:  Procedure Laterality Date  . ADRENALECTOMY    . CATARACT EXTRACTION  10/2014  . CATARACT EXTRACTION EXTRACAPSULAR  11/05/2016   with Insertion Intraocular Prostheis.   . COLONOSCOPY  05/06/2017   with removal lesions by snare.  . COLONOSCOPY W/ BIOPSIES     05/06/2017  . NEPHRECTOMY    . POPLITEAL SYNOVIAL CYST EXCISION     . [place]  . THYROIDECTOMY    . THYROIDECTOMY, PARTIAL    . TONSILLECTOMY     Social History:   reports that he quit smoking about 43 years ago. His smoking use included cigarettes. He has a 10.00 pack-year smoking history. He has never used smokeless tobacco. He reports previous alcohol use. He reports that he does not use drugs.  Family History  Problem Relation Age of Onset  . Ovarian cancer Mother   . Cancer Mother   . Cancer Father   . Heart disease Father   . Coronary artery disease Father   . Hypertension Father   . Macular degeneration Father   . Heart disease Brother   . Diabetes Brother   . Coronary artery disease Brother   . Diabetes type II Brother   . Coronary artery disease Paternal Grandfather     Medications: Patient's Medications  New Prescriptions   No medications on file  Previous Medications   ACCU-CHEK SOFTCLIX LANCETS LANCETS    Use to check blood sugar once daily. Dx: E11.49   BLOOD GLUCOSE METER KIT AND SUPPLIES    1 each by Other route as directed. Dispense based on patient and insurance preference. Use up to four times daily as directed. (FOR ICD-10 E10.9, E11.9).   CHOLECALCIFEROL 125 MCG (5000 UT) CAPSULE    Take 5,000 Units by mouth daily.   COLCHICINE 0.6 MG TABLET    Take 0.6 mg by mouth as needed. First sign of Gout Flare. Take 2 tablets ( 1.55m) by mouth at first sign of gout flare followed by 1 tablet ( 0.6230m after 1 hour. ( max 1.30m54mithin 1 hours)   CYANOCOBALAMIN 1000 MCG TABLET    Take 1,000 mcg by mouth every other day.   FLUDROCORTISONE ACETATE (FLORINEF PO)    Take 0.1 mg by mouth every other day. On alternate days take 1/2 tablet.   FUROSEMIDE (LASIX) 20 MG TABLET    Take 20 mg by mouth as needed. For Edema.   GABAPENTIN (NEURONTIN) 600 MG TABLET    Take 900 mg by mouth daily. Takes 1 and 1/2 tablet by mouth nightly.   GLUCOSE BLOOD (ACCU-CHEK AVIVA PLUS) TEST STRIP    Use to test blood sugar once daily. Dx: E11.49    HYDROCORTISONE (CORTEF) 10 MG TABLET    Take 10 mg by mouth daily. Takes 1 and 1/2 tablet every morning, 1/2 tablet every afternoon, and 1/2 tablet every evening. Take double dose as directed for stress, may take up to 85 tablets monthly.   LEVOTHYROXINE (SYNTHROID) 75 MCG TABLET    Take 75 mcg by mouth daily.   METFORMIN (GLUMETZA) 500 MG (MOD) 24 HR TABLET  Take 500 mg by mouth daily.   MULTIPLE VITAMINS-MINERALS (PRESERVISION AREDS 2 PO)    Take 1 capsule by mouth in the morning and at bedtime.   NORTRIPTYLINE (PAMELOR) 10 MG CAPSULE    Start Nortriptyline (Pamelor) 10 mg nightly for one week, then increase to 20 mg nightly   PSYLLIUM HUSK PO    Take 3.4 g by mouth every evening.  Modified Medications   No medications on file  Discontinued Medications   DOXYCYCLINE (VIBRA-TABS) 100 MG TABLET    Take 1 tablet (100 mg total) by mouth 2 (two) times daily.   MAGNESIUM 100 MG TABS    Take 100 mg by mouth at bedtime.   OXYCODONE (OXY IR/ROXICODONE) 5 MG IMMEDIATE RELEASE TABLET    Take 5 mg by mouth every 6 (six) hours as needed for severe pain. PRN pain   SACCHAROMYCES BOULARDII (FLORASTOR) 250 MG CAPSULE    Take 1 capsule (250 mg total) by mouth 2 (two) times daily.    Physical Exam:  Vitals:   09/08/20 1310  BP: 110/80  Pulse: 82  Temp: (!) 97.5 F (36.4 C)  TempSrc: Temporal  SpO2: 100%  Weight: 200 lb (90.7 kg)  Height: _0  (1.727 m)   Body mass index is 30.41 kg/m. Wt Readings from Last 3 Encounters:  09/08/20 200 lb (90.7 kg)  04/26/20 200 lb (90.7 kg)  01/12/20 203 lb (92.1 kg)    Physical Exam Constitutional:      Appearance: Normal appearance.  Musculoskeletal:     Right lower leg: No edema.     Left lower leg: No edema.     Left foot: Foot drop present.       Feet:  Feet:     Left foot:     Skin integrity: Skin integrity normal.     Comments: Tenderness to Achilles tendon  Skin:    General: Skin is warm and dry.     Findings: No erythema.  Neurological:      Mental Status: He is alert.     Gait: Gait abnormal (uses a cane, drags left foot).  Psychiatric:        Mood and Affect: Mood normal.        Behavior: Behavior normal.     Labs reviewed: Basic Metabolic Panel: No results for input(s): NA, K, CL, CO2, GLUCOSE, BUN, CREATININE, CALCIUM, MG, PHOS, TSH in the last 8760 hours. Liver Function Tests: No results for input(s): AST, ALT, ALKPHOS, BILITOT, PROT, ALBUMIN in the last 8760 hours. No results for input(s): LIPASE, AMYLASE in the last 8760 hours. No results for input(s): AMMONIA in the last 8760 hours. CBC: No results for input(s): WBC, NEUTROABS, HGB, HCT, MCV, PLT in the last 8760 hours. Lipid Panel: No results for input(s): CHOL, HDL, LDLCALC, TRIG, CHOLHDL, LDLDIRECT in the last 8760 hours. TSH: No results for input(s): TSH in the last 8760 hours. A1C: No results found for: HGBA1C   Assessment/Plan 1. Pain of left heel Tenderness noted around Achilles tendon, possibly with some tendinitis due to abnormal gait. He is under hospice at this time. Discussed if he does not qualify at the beginning of the year podiatry referral. Also PT may benefit gait.  Ice area 2-3 times daily  Can use muscle rub after or between using ice Can use tylenol as needed for pain Avoid foot wear that rubs area Consider podiatry for insert   2. Insomnia Doing well on nortriptyline   To notify if redness or  warmth occurs or pain worsens   Janeann Paisley K. [place], [facility] [phone]

## 2020-09-08 NOTE — Patient Instructions (Addendum)
Ice foot 2-3 times daily  Can use muscle rub after or between using ice Can use tylenol as needed for pain Avoid foot wear that rubs area Consider podiatry for insert   To notify if redness or warmth occurs or pain worsens

## 2020-11-21 DIAGNOSIS — G479 Sleep disorder, unspecified: Secondary | ICD-10-CM | POA: Insufficient documentation

## 2020-11-21 DIAGNOSIS — R200 Anesthesia of skin: Secondary | ICD-10-CM | POA: Insufficient documentation

## 2020-11-21 DIAGNOSIS — R202 Paresthesia of skin: Secondary | ICD-10-CM | POA: Insufficient documentation

## 2020-11-21 LAB — BASIC METABOLIC PANEL
BUN: 16 (ref 4–21)
CO2: 30 — AB (ref 13–22)
Chloride: 102 (ref 99–108)
Creatinine: 1.1 (ref 0.6–1.3)
Glucose: 138
Potassium: 4.5 (ref 3.4–5.3)
Sodium: 140 (ref 137–147)

## 2020-11-21 LAB — COMPREHENSIVE METABOLIC PANEL: Calcium: 9.1 (ref 8.7–10.7)

## 2020-11-21 LAB — TSH: TSH: 3.4 (ref 0.41–5.90)

## 2020-11-21 LAB — HEMOGLOBIN A1C: Hemoglobin A1C: 6.5

## 2020-12-15 ENCOUNTER — Ambulatory Visit: Payer: Medicare Other | Admitting: Nurse Practitioner

## 2020-12-15 ENCOUNTER — Encounter: Payer: Self-pay | Admitting: Nurse Practitioner

## 2020-12-15 ENCOUNTER — Other Ambulatory Visit: Payer: Self-pay

## 2020-12-15 VITALS — BP 120/80 | HR 88 | Temp 98.6°F | Ht 68.0 in

## 2020-12-15 DIAGNOSIS — R2681 Unsteadiness on feet: Secondary | ICD-10-CM

## 2020-12-15 DIAGNOSIS — Z9229 Personal history of other drug therapy: Secondary | ICD-10-CM | POA: Diagnosis not present

## 2020-12-15 DIAGNOSIS — S99921A Unspecified injury of right foot, initial encounter: Secondary | ICD-10-CM | POA: Diagnosis not present

## 2020-12-15 NOTE — Progress Notes (Signed)
Careteam: Patient Care Team: [name], NP as PCP - General (Geriatric Medicine) [name], MD as Physician Assistant (Endocrinology) [name], MD as Referring Physician (Neurology)  Advanced Directive information    Allergies  Allergen Reactions  . Other Rash    Blisters with bandaids Blisters with bandaids   . Soap Itching    Any Deodorant Soap.  . Tegaderm Ag Mesh [Silver]   . Tape Rash    Blisters, rash. Paper tape OK. Blisters, rash. Paper tape OK.     Chief Complaint  Patient presents with  . Acute Visit    Patient would like to discuss getting booster shot. Patient will need an additional dose of hydrocotisone when he takes the booster.      HPI: Patient is a 75 y.o. male seen in today at the [facility] for to discuss the [place].  As things are opening up he is worried about getting exposed to [place]. Wife is doing traveling.  He had fever and chills and adrenal crisis after the booster. He took an extra dose of hydrocortisone for a few days which helped symptoms.   Saw neurologist due to difficulty walking and Has been having falls. He is under hospice, has been recommended to go to PT- rehab and hospice has come to agreement for 4 session  He had a fall and toe got stuck under the door. Now toenail is blue.     Review of Systems:  Review of Systems  Constitutional: Negative for chills and fever.  Respiratory: Negative for shortness of breath.   Cardiovascular: Negative for chest pain.  Musculoskeletal: Positive for falls and myalgias.  Skin: Negative for itching and rash.  Neurological: Positive for weakness.       Unsteady   Past Medical History:  Diagnosis Date  . Abnormal heart rhythm 10/20/2014  . Addison's disease ([place])   . Adrenal insufficiency ([place])   . Allergy   . Ambulates with cane   . Anxiety   . Arrhythmia   . Cataract cortical, senile   . 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  . Chronic kidney  disease, stage 3 ([place]) 11/13/2011  . Cough 03/29/2014  . Edema leg 03/07/2017  . Elevated cholesterol with high triglycerides 09/04/2013  . Elevated red blood cell count   . Erectile dysfunction   . Falls frequently   . Fever 03/29/2014  . Generalized weakness 03/29/2014  . GERD (gastroesophageal reflux disease)   . Gout 09/04/2013  . H/O adrenal insufficiency 05/17/2014  . H/O atrial flutter 03/07/2017  . H/O atrial flutter   . H/O eye surgery   . H/O sebaceous cyst 01/06/2018  . H/O thyroid disease   . H/O upper respiratory infection 10/15/2013  . Hearing loss   . History of back surgery   . History of blepharoplasty   . History of cancer 10/04/2015  . History of chemotherapy   . History of esophagogastroduodenoscopy (EGD)   . History of prediabetes   . Hyperlipidemia 06/16/2014  . Hypertension   . Hypertriglyceridemia   . Hypothyroidism (acquired) 04/14/2014  . Left leg swelling 11/23/2016  . Low HDL (under 40)   . 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[place]) 11/10/2010  . Malignant neoplasm of unspecified kidney, except renal pelvis ([place])   . OSA (obstructive sleep apnea) 07/24/2016  . OSA (obstructive sleep apnea)   . PAF (paroxysmal atrial fibrillation) ([place])   . PAF (paroxysmal atrial fibrillation) ([place])   . Pancreatic mass  08/01/2018  . Renal carcinoma ([place]) 06/16/2015  . RLS (restless legs syndrome) 07/24/2016  . Sleep apnea   . Testicular hypofunction   . Tubular adenoma 10/28/2017  . Tussive syncopes 10/15/2013  . Type 2 diabetes mellitus ([place])   . Vitamin D deficiency disease    Past Surgical History:  Procedure Laterality Date  . ADRENALECTOMY    . CATARACT EXTRACTION  10/2014  . CATARACT EXTRACTION EXTRACAPSULAR  11/05/2016   with Insertion Intraocular Prostheis.   . COLONOSCOPY  05/06/2017   with removal lesions by snare.  . COLONOSCOPY W/ BIOPSIES     05/06/2017  . NEPHRECTOMY    . POPLITEAL SYNOVIAL CYST EXCISION    . [place]  .  THYROIDECTOMY    . THYROIDECTOMY, PARTIAL    . TONSILLECTOMY     Social History:   reports that he quit smoking about 44 years ago. His smoking use included cigarettes. He has a 10.00 pack-year smoking history. He has never used smokeless tobacco. He reports previous alcohol use. He reports that he does not use drugs.  Family History  Problem Relation Age of Onset  . Ovarian cancer Mother   . Cancer Mother   . Cancer Father   . Heart disease Father   . Coronary artery disease Father   . Hypertension Father   . Macular degeneration Father   . Heart disease Brother   . Diabetes Brother   . Coronary artery disease Brother   . Diabetes type II Brother   . Coronary artery disease Paternal Grandfather     Medications: Patient's Medications  New Prescriptions   No medications on file  Previous Medications   ACCU-CHEK SOFTCLIX LANCETS LANCETS    Use to check blood sugar once daily. Dx: E11.49   BLOOD GLUCOSE METER KIT AND SUPPLIES    1 each by Other route as directed. Dispense based on patient and insurance preference. Use up to four times daily as directed. (FOR ICD-10 E10.9, E11.9).   CHOLECALCIFEROL 125 MCG (5000 UT) CAPSULE    Take 5,000 Units by mouth daily.   COLCHICINE 0.6 MG TABLET    Take 0.6 mg by mouth as needed. First sign of Gout Flare. Take 2 tablets ( 1.$RemoveBef'2mg'DhgBqDcKtg$ ) by mouth at first sign of gout flare followed by 1 tablet ( 0.$RemoveBe'6mg'fLyAZZQAP$ ) after 1 hour. ( max 1.$Remov'8mg'ODDrEV$  within 1 hours)   CYANOCOBALAMIN 1000 MCG TABLET    Take 1,000 mcg by mouth every other day.   FLUDROCORTISONE ACETATE (FLORINEF PO)    Take 0.1 mg by mouth every other day. On alternate days take 1/2 tablet.   FUROSEMIDE (LASIX) 20 MG TABLET    Take 20 mg by mouth as needed. For Edema.   GABAPENTIN (NEURONTIN) 600 MG TABLET    Take 900 mg by mouth daily. Takes 1 and 1/2 tablet by mouth nightly.   GLUCOSE BLOOD (ACCU-CHEK AVIVA PLUS) TEST STRIP    Use to test blood sugar once daily. Dx: E11.49   HYDROCORTISONE (CORTEF) 10 MG  TABLET    Take 10 mg by mouth daily. Takes 1 and 1/2 tablet every morning, 1/2 tablet every afternoon, and 1/2 tablet every evening. Take double dose as directed for stress, may take up to 85 tablets monthly.   LEVOTHYROXINE (SYNTHROID) 75 MCG TABLET    Take 75 mcg by mouth daily.   METFORMIN (GLUMETZA) 500 MG (MOD) 24 HR TABLET    Take 500 mg by mouth daily.   MULTIPLE VITAMINS-MINERALS (PRESERVISION AREDS  2 PO)    Take 1 capsule by mouth in the morning and at bedtime.   NORTRIPTYLINE (PAMELOR) 10 MG CAPSULE    Start Nortriptyline (Pamelor) 10 mg nightly for one week, then increase to 20 mg nightly   PSYLLIUM HUSK PO    Take 3.4 g by mouth every evening.  Modified Medications   No medications on file  Discontinued Medications   No medications on file    Physical Exam:  Vitals:   12/15/20 0926  BP: 120/80  Pulse: 88  Temp: 98.6 F (37 C)  SpO2: 97%  Height: $Remove'5\' 8"'VhNYCpe$  (1.727 m)   Body mass index is 30.41 kg/m. Wt Readings from Last 3 Encounters:  09/08/20 200 lb (90.7 kg)  04/26/20 200 lb (90.7 kg)  01/12/20 203 lb (92.1 kg)    Physical Exam Constitutional:      General: He is not in acute distress.    Appearance: He is well-developed. He is not diaphoretic.  HENT:     Head: Normocephalic and atraumatic.     Mouth/Throat:     Pharynx: No oropharyngeal exudate.  Eyes:     Conjunctiva/sclera: Conjunctivae normal.     Pupils: Pupils are equal, round, and reactive to light.  Musculoskeletal:        General: No tenderness.     Cervical back: Normal range of motion and neck supple.  Feet:     Comments: Right great toenail black at nailbed base Skin:    General: Skin is warm and dry.  Neurological:     Mental Status: He is alert and oriented to person, place, and time.     Labs reviewed: Basic Metabolic Panel: No results for input(s): NA, K, CL, CO2, GLUCOSE, BUN, CREATININE, CALCIUM, MG, PHOS, TSH in the last 8760 hours. Liver Function Tests: No results for input(s):  AST, ALT, ALKPHOS, BILITOT, PROT, ALBUMIN in the last 8760 hours. No results for input(s): LIPASE, AMYLASE in the last 8760 hours. No results for input(s): AMMONIA in the last 8760 hours. CBC: No results for input(s): WBC, NEUTROABS, HGB, HCT, MCV, PLT in the last 8760 hours. Lipid Panel: No results for input(s): CHOL, HDL, LDLCALC, TRIG, CHOLHDL, LDLDIRECT in the last 8760 hours. TSH: No results for input(s): TSH in the last 8760 hours. A1C: No results found for: HGBA1C   Assessment/Plan 1. COVID-19 vaccine series completed -at this time he has completed the COVID-19 vaccine series however some of his neighbors have gotten their 4th vaccine. There is no current guidelines for 4th vaccine booster at this time but educated to keep up to date on issue.   2. Injury of toenail of right foot, initial encounter Likely will lose toenail due to injury to nail bed during fall. Encouraged just to monitor at this time and keep nail trimmed.   3. Gait instability Encouraged to use walker and complete PT and continue to work on strength training exercises.    Carlos American. [place], [facility] [phone]

## 2020-12-23 ENCOUNTER — Telehealth: Payer: Self-pay

## 2020-12-23 DIAGNOSIS — C642 Malignant neoplasm of left kidney, except renal pelvis: Secondary | ICD-10-CM

## 2020-12-23 NOTE — Telephone Encounter (Signed)
Will consult with Dani Gobble need for Palliative Care

## 2020-12-23 NOTE — Telephone Encounter (Signed)
Tony Huffman with [facility] called and left message on clinical intake phone about referral to palliative care. Please advise  Message routed to [name], NP

## 2020-12-26 NOTE — Telephone Encounter (Signed)
Spoke with Juanda Crumble at [facility] he stated that he will just be managing symptoms while patient is doing PT and OT. He is just requesting referral for that for palliative care

## 2020-12-26 NOTE — Telephone Encounter (Signed)
Do they want to move him from hospice to palliative care? Okay for this referral if needed

## 2020-12-28 ENCOUNTER — Telehealth: Payer: Self-pay | Admitting: Adult Health Nurse Practitioner

## 2020-12-28 NOTE — Telephone Encounter (Signed)
Called patient to offer to schedule a Palliative Consult, no answer - left message with reason for call along with my name and call back number requesting a return call.

## 2020-12-28 NOTE — Telephone Encounter (Signed)
Returned call to patient and discussed Palliative referral/services with him and he was in agreement with scheduling visit.  I have scheduled an In-home Consult for 01/02/21 @ 2 PM.

## 2021-01-02 ENCOUNTER — Other Ambulatory Visit: Payer: Self-pay

## 2021-01-02 ENCOUNTER — Other Ambulatory Visit: Payer: Medicare Other | Admitting: Adult Health Nurse Practitioner

## 2021-01-02 DIAGNOSIS — R296 Repeated falls: Secondary | ICD-10-CM

## 2021-01-02 DIAGNOSIS — G2581 Restless legs syndrome: Secondary | ICD-10-CM

## 2021-01-02 DIAGNOSIS — C649 Malignant neoplasm of unspecified kidney, except renal pelvis: Secondary | ICD-10-CM

## 2021-01-02 DIAGNOSIS — Z515 Encounter for palliative care: Secondary | ICD-10-CM

## 2021-01-02 DIAGNOSIS — G629 Polyneuropathy, unspecified: Secondary | ICD-10-CM

## 2021-01-02 NOTE — Progress Notes (Signed)
[facility] Consult Note Telephone: [phone]  Fax: [phone]  PATIENT NAME: Tony Huffman DOB: Feb 08, 1946 MRN: [id number]  PRIMARY CARE PROVIDER:   [name], NP  REFERRING PROVIDER:  [name], NP [place],  [place] [address]  RESPONSIBLE PARTY:   Self [phone] Cameo Shewell, wife [phone]  Chief complaint: initial palliative visit for complex medical decision making  Wife present during visit today    RECOMMENDATIONS and PLAN:  1.  Advanced care planning.  Patient is DNR.  Patient has DNR and MOST in the home.  MOST indicates DNR, comfort measures, antibiotics for infection, no IV fluids, no feeding tube.  Scanned and uploaded DNR and MOST to [employer]. Patient does not want to make any changes to these forms today.  2.  Metastatic renal cancer.  Has mass on pancreas and nodules to lungs. Patient does not want to pursue any treatment.    3.  Frequent falls.  Works with PT.  Continue PT as ordered  4.  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  He gets good relief with gabapentin, nortriptyline, and capsaicin cream PRN.  Continue these as ordered.    Palliative will continue to monitor symptom management/decline and make recommendations as needed.  Follow up visit in 3 months.  Encouraged to call with any questions or concerns.    I spent 75 minutes providing this consultation, including time spent with patient/family, provider coordination, chart review including most recent labs and imaging, documentation. More than 50% of the time in this consultation was spent coordinating communication.   HISTORY OF PRESENT ILLNESS:  Tony Huffman is a 75 y.o. year old male with multiple medical problems including Addison's disease, acquired adrenal insufficiency, hypothyroidism s/p partial thyroidectomy, anxiety, chronic gouty arthritis, DMT2, HLD, THN, OSA, a-fib, RLS, vitamin D deficiency, h/o of renal carcinoma  with nephrectomy and removal of adrenal glands, frequent falls. Palliative Care was asked to help address goals of care. Patient lives at home with his wife.  He was under hospice services for 2 years and recently discharged due to wanting to pursue PT.     Patient states having needle like pain in feet related to neuropathy.  He also has RLS.  He gets good relief with gabapentin 900 mg QHS and nortriptyline 20 mg QHS.  He also uses capsaicin cream as needed with good relief.  His appetite is good and weight stable around 200 pounds.  Patient does state that he has been having increased SOB with activity and is unable to walk as much or help with cooking with his wife like he used to over the past 2 months.  He has frequent falls/near falls with last one with injury 2 months ago. He is independent of ADLs. He uses cane to ambulate and uses rollator at night when he gets up.  He is working with PT and feels like he is improving.  States that occasionally he has problems falling asleep but once asleep he stays asleep and gets up once in the night to urinate and has no problems falling back asleep after this.  Patient uses CPAP at night for OSA.   Rest of 10 ROS asked and negative.  CODE STATUS: DNR  PPS: 50% HOSPICE ELIGIBILITY/DIAGNOSIS: TBD  PHYSICAL EXAM:  BP 118/62  HR 73 O2 98% on RA General: patient in NAD Eyes: sclera anicteric and noninjected with no discharge noted ENMT; moist mucous membranes Cardiovascular: regular rate and rhythm  Pulmonary: diminished air flow in LLL though no adventitious breath sounds heard; normal respiratory effort Abdomen: soft, nontender, + bowel sounds Extremities: trace edema to ankles, no joint deformities Skin: no rashes on exposed skin Neurological: Weakness but otherwise nonfocal   Family History  Problem Relation Age of Onset  . Ovarian cancer Mother   . Cancer Mother   . Cancer Father   . Heart disease Father   . Coronary artery disease Father    . Hypertension Father   . Macular degeneration Father   . Heart disease Brother   . Diabetes Brother   . Coronary artery disease Brother   . Diabetes type II Brother   . Coronary artery disease Paternal Grandfather       PAST MEDICAL HISTORY:  Past Medical History:  Diagnosis Date  . Abnormal heart rhythm 10/20/2014  . Addison's disease ([place])   . Adrenal insufficiency ([place])   . Allergy   . Ambulates with cane   . Anxiety   . Arrhythmia   . Cataract cortical, senile   . 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  . Chronic kidney disease, stage 3 ([place]) 11/13/2011  . Cough 03/29/2014  . Edema leg 03/07/2017  . Elevated cholesterol with high triglycerides 09/04/2013  . Elevated red blood cell count   . Erectile dysfunction   . Falls frequently   . Fever 03/29/2014  . Generalized weakness 03/29/2014  . GERD (gastroesophageal reflux disease)   . Gout 09/04/2013  . H/O adrenal insufficiency 05/17/2014  . H/O atrial flutter 03/07/2017  . H/O atrial flutter   . H/O eye surgery   . H/O sebaceous cyst 01/06/2018  . H/O thyroid disease   . H/O upper respiratory infection 10/15/2013  . Hearing loss   . History of back surgery   . History of blepharoplasty   . History of cancer 10/04/2015  . History of chemotherapy   . History of esophagogastroduodenoscopy (EGD)   . History of prediabetes   . Hyperlipidemia 06/16/2014  . Hypertension   . Hypertriglyceridemia   . Hypothyroidism (acquired) 04/14/2014  . Left leg swelling 11/23/2016  . Low HDL (under 40)   . 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[place]) 11/10/2010  . Malignant neoplasm of unspecified kidney, except renal pelvis ([place])   . OSA (obstructive sleep apnea) 07/24/2016  . OSA (obstructive sleep apnea)   . PAF (paroxysmal atrial fibrillation) ([place])   . PAF (paroxysmal atrial fibrillation) ([place])   . Pancreatic mass 08/01/2018  . Renal carcinoma ([place]) 06/16/2015  . RLS (restless legs syndrome) 07/24/2016  . Sleep  apnea   . Testicular hypofunction   . Tubular adenoma 10/28/2017  . Tussive syncopes 10/15/2013  . Type 2 diabetes mellitus ([place])   . Vitamin D deficiency disease     SOCIAL HX:  Social History   Tobacco Use  . Smoking status: Former Smoker    Packs/day: 1.00    Years: 10.00    Pack years: 10.00    Types: Cigarettes    Quit date: 1978    Years since quitting: 44.2  . Smokeless tobacco: Never Used  Substance Use Topics  . Alcohol use: Not Currently    ALLERGIES:  Allergies  Allergen Reactions  . Other Rash    Blisters with bandaids Blisters with bandaids   . Soap Itching    Any Deodorant Soap.  . Tegaderm Ag Mesh [Silver]   . Tape Rash    Blisters, rash. Paper tape OK. Blisters, rash. Paper tape OK.  PERTINENT MEDICATIONS:  Outpatient Encounter Medications as of 01/02/2021  Medication Sig  . Accu-Chek Softclix Lancets lancets Use to check blood sugar once daily. Dx: E11.49  . blood glucose meter kit and supplies 1 each by Other route as directed. Dispense based on patient and insurance preference. Use up to four times daily as directed. (FOR ICD-10 E10.9, E11.9).  Marland Kitchen Cholecalciferol 125 MCG (5000 UT) capsule Take 5,000 Units by mouth daily.  . colchicine 0.6 MG tablet Take 0.6 mg by mouth as needed. First sign of Gout Flare. Take 2 tablets ( 1.67m) by mouth at first sign of gout flare followed by 1 tablet ( 0.657m after 1 hour. ( max 1.16m7mithin 1 hours)  . cyanocobalamin 1000 MCG tablet Take 1,000 mcg by mouth every other day.  . Fludrocortisone Acetate (FLORINEF PO) Take 0.1 mg by mouth every other day. On alternate days take 1/2 tablet.  . furosemide (LASIX) 20 MG tablet Take 20 mg by mouth as needed. For Edema.  . gabapentin (NEURONTIN) 600 MG tablet Take 900 mg by mouth daily. Takes 1 and 1/2 tablet by mouth nightly.  . gMarland Kitchenucose blood (ACCU-CHEK AVIVA PLUS) test strip Use to test blood sugar once daily. Dx: E11.49  . hydrocortisone (CORTEF) 10 MG tablet Take 10  mg by mouth daily. Takes 1 and 1/2 tablet every morning, 1/2 tablet every afternoon, and 1/2 tablet every evening. Take double dose as directed for stress, may take up to 85 tablets monthly.  . levothyroxine (SYNTHROID) 75 MCG tablet Take 75 mcg by mouth daily.  . metFORMIN (GLUMETZA) 500 MG (MOD) 24 hr tablet Take 500 mg by mouth daily.  . Multiple Vitamins-Minerals (PRESERVISION AREDS 2 PO) Take 1 capsule by mouth in the morning and at bedtime.  . nortriptyline (PAMELOR) 10 MG capsule Start Nortriptyline (Pamelor) 10 mg nightly for one week, then increase to 20 mg nightly  . PSYLLIUM HUSK PO Take 3.4 g by mouth every evening.   No facility-administered encounter medications on file as of 01/02/2021.     Aziz Slape K DJenetta DownerP

## 2021-01-20 ENCOUNTER — Other Ambulatory Visit: Payer: Self-pay

## 2021-01-20 ENCOUNTER — Ambulatory Visit
Admission: RE | Admit: 2021-01-20 | Discharge: 2021-01-20 | Disposition: A | Discharge status: Home / Self Care | Payer: Medicare Other | Source: Ambulatory Visit | Attending: Family | Admitting: Family

## 2021-01-20 ENCOUNTER — Encounter: Payer: Self-pay | Admitting: Family

## 2021-01-20 ENCOUNTER — Ambulatory Visit (INDEPENDENT_AMBULATORY_CARE_PROVIDER_SITE_OTHER): Payer: Medicare Other | Admitting: Family

## 2021-01-20 VITALS — BP 110/70 | HR 88 | Temp 97.8°F | Resp 18 | Ht 68.0 in | Wt 207.2 lb

## 2021-01-20 DIAGNOSIS — R2681 Unsteadiness on feet: Secondary | ICD-10-CM

## 2021-01-20 DIAGNOSIS — R42 Dizziness and giddiness: Secondary | ICD-10-CM

## 2021-01-20 DIAGNOSIS — R29898 Other symptoms and signs involving the musculoskeletal system: Secondary | ICD-10-CM

## 2021-01-20 NOTE — Patient Instructions (Signed)
- CT scan of the head order imaging place at [facility] will call you for appointment.  - Please go to the ED if symptoms worsen  - Labs done today will call you with results   Dizziness Dizziness is a common problem. It makes you feel unsteady or light-headed. You may feel like you are about to pass out (faint). Dizziness can lead to getting hurt if you stumble or fall. Dizziness can be caused by many things, including:  Medicines.  Not having enough water in your body (dehydration).  Illness. Follow these instructions at home: Eating and drinking  Drink enough fluid to keep your pee (urine) clear or pale yellow. This helps to keep you from getting dehydrated. Try to drink more clear fluids, such as water.  Do not drink alcohol.  Limit how much caffeine you drink or eat, if your doctor tells you to do that.  Limit how much salt (sodium) you drink or eat, if your doctor tells you to do that.   Activity  Avoid making quick movements. ? When you stand up from sitting in a chair, steady yourself until you feel okay. ? In the morning, first sit up on the side of the bed. When you feel okay, stand slowly while you hold onto something. Do this until you know that your balance is fine.  If you need to stand in one place for a long time, move your legs often. Tighten and relax the muscles in your legs while you are standing.  Do not drive or use heavy machinery if you feel dizzy.  Avoid bending down if you feel dizzy. Place items in your home so you can reach them easily without leaning over.   Lifestyle  Do not use any products that contain nicotine or tobacco, such as cigarettes and e-cigarettes. If you need help quitting, ask your doctor.  Try to lower your stress level. You can do this by using methods such as yoga or meditation. Talk with your doctor if you need help. General instructions  Watch your dizziness for any changes.  Take over-the-counter and  prescription medicines only as told by your doctor. Talk with your doctor if you think that you are dizzy because of a medicine that you are taking.  Tell a friend or a family member that you are feeling dizzy. If he or she notices any changes in your behavior, have this person call your doctor.  Keep all follow-up visits as told by your doctor. This is important. Contact a doctor if:  Your dizziness does not go away.  Your dizziness or light-headedness gets worse.  You feel sick to your stomach (nauseous).  You have trouble hearing.  You have new symptoms.  You are unsteady on your feet.  You feel like the room is spinning. Get help right away if:  You throw up (vomit) or have watery poop (diarrhea), and you cannot eat or drink anything.  You have trouble: ? Talking. ? Walking. ? Swallowing. ? Using your arms, hands, or legs.  You feel generally weak.  You are not thinking clearly, or you have trouble forming sentences. A friend or family member may notice this.  You have: ? Chest pain. ? Pain in your belly (abdomen). ? Shortness of breath. ? Sweating.  Your vision changes.  You are bleeding.  You have a very bad headache.  You have neck pain or a stiff neck.  You have a fever. These symptoms may be an emergency.  Do not wait to see if the symptoms will go away. Get medical help right away. Call your local emergency services (911 in the [place] Do not drive yourself to the hospital. Summary  Dizziness makes you feel unsteady or light-headed. You may feel like you are about to pass out (faint).  Drink enough fluid to keep your pee (urine) clear or pale yellow. Do not drink alcohol.  Avoid making quick movements if you feel dizzy.  Watch your dizziness for any changes. This information is not intended to replace advice given to you by your health care provider. Make sure you discuss any questions you have with your health care provider. Document Revised:  06/08/2020 Document Reviewed: 10/04/2016 Elsevier Patient Education  [place].

## 2021-01-20 NOTE — Progress Notes (Addendum)
Provider: Willliam Pettet FNP-C  [name], NP  Patient Care Team: [name], NP as PCP - General (Geriatric Medicine) [name], MD as Physician Assistant (Endocrinology) [name], MD as Referring Physician (Neurology)  Extended Emergency Contact Information Primary Emergency Contact: Keelon, Z[place] Mobile Phone: [phone] Relation: Spouse  Code Status:  DNR Goals of care: Advanced Directive information Advanced Directives 01/20/2021  Does Patient Have a Medical Advance Directive? Yes  Type of Advance Directive Out of facility DNR (pink MOST or yellow form)  Does patient want to make changes to medical advance directive? No - Patient declined  Copy of [place] in Chart? -     Chief Complaint  Patient presents with  . Acute Visit    Complains of dizziness since 3am.     HPI:  Pt is a 75 y.o. male seen today for an acute visit for evaluation of dizziness since 3 am today.He describes as room spinning.when he looks at things or floor seems to be moving to the side.Dizzines was constant but now seems to be intermittent.He went back to sleep in the morning but when he woke up he still had symptoms.  Dizziness does not occur when he changes position.Dizziness relieved by lying down.when he had symptoms in the morning he also had some numbness on right side of the head though wife not sure whether he might have slept on his CPAP machine strap.also had weakness of his right leg was unable to move leg while in the bathroom which was usual for him.  He has not started on any new medication  .Denies any hearing loss,tinnititis,Visual changes,chest discomfort,palpaitation,nausea,vomiting or speech difficulties.He denies any Numbness/tingling or weakness of extremities now.Has required uses of wheelchair. His CBG runs in the 120's.was 159 this morning.  He drinks large cup of herbal tea,one glass of water mixed with metamucil for a total of  48 Oz of fluid per day.  Wife states was evaluated by EMS and vital signs were normal.He was advised to follow up with PCP.     Past Medical History:  Diagnosis Date  . Abnormal heart rhythm 10/20/2014  . Addison's disease ([place])   . Adrenal insufficiency ([place])   . Allergy   . Ambulates with cane   . Anxiety   . Arrhythmia   . Cataract cortical, senile   . 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  . Chronic kidney disease, stage 3 ([place]) 11/13/2011  . Cough 03/29/2014  . Edema leg 03/07/2017  . Elevated cholesterol with high triglycerides 09/04/2013  . Elevated red blood cell count   . Erectile dysfunction   . Falls frequently   . Fever 03/29/2014  . Generalized weakness 03/29/2014  . GERD (gastroesophageal reflux disease)   . Gout 09/04/2013  . H/O adrenal insufficiency 05/17/2014  . H/O atrial flutter 03/07/2017  . H/O atrial flutter   . H/O eye surgery   . H/O sebaceous cyst 01/06/2018  . H/O thyroid disease   . H/O upper respiratory infection 10/15/2013  . Hearing loss   . History of back surgery   . History of blepharoplasty   . History of cancer 10/04/2015  . History of chemotherapy   . History of esophagogastroduodenoscopy (EGD)   . History of prediabetes   . Hyperlipidemia 06/16/2014  . Hypertension   . Hypertriglyceridemia   . Hypothyroidism (acquired) 04/14/2014  . Left leg swelling 11/23/2016  . Low HDL (under 40)   . 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[place]) 11/10/2010  .  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 OSA (obstructive sleep apnea) 07/24/2016  . OSA (obstructive sleep apnea)   . PAF (paroxysmal atrial fibrillation) ([place])   . PAF (paroxysmal atrial fibrillation) ([place])   . Pancreatic mass 08/01/2018  . Renal carcinoma ([place]) 06/16/2015  . RLS (restless legs syndrome) 07/24/2016  . Sleep apnea   . Testicular hypofunction   . Tubular adenoma 10/28/2017  . Tussive syncopes 10/15/2013  . Type 2 diabetes mellitus ([place])    . Vitamin D deficiency disease    Past Surgical History:  Procedure Laterality Date  . ADRENALECTOMY    . CATARACT EXTRACTION  10/2014  . CATARACT EXTRACTION EXTRACAPSULAR  11/05/2016   with Insertion Intraocular Prostheis.   . COLONOSCOPY  05/06/2017   with removal lesions by snare.  . COLONOSCOPY W/ BIOPSIES     05/06/2017  . NEPHRECTOMY    . POPLITEAL SYNOVIAL CYST EXCISION    . [place]  . THYROIDECTOMY    . THYROIDECTOMY, PARTIAL    . TONSILLECTOMY      Allergies  Allergen Reactions  . Other Rash    Blisters with bandaids Blisters with bandaids   . Soap Itching    Any Deodorant Soap.  . Tegaderm Ag Mesh [Silver]   . Tape Rash    Blisters, rash. Paper tape OK. Blisters, rash. Paper tape OK.     Outpatient Encounter Medications as of 01/20/2021  Medication Sig  . Accu-Chek Softclix Lancets lancets Use to check blood sugar once daily. Dx: E11.49  . blood glucose meter kit and supplies 1 each by Other route as directed. Dispense based on patient and insurance preference. Use up to four times daily as directed. (FOR ICD-10 E10.9, E11.9).  Marland Kitchen Cholecalciferol 125 MCG (5000 UT) capsule Take 5,000 Units by mouth daily.  . colchicine 0.6 MG tablet Take 0.6 mg by mouth as needed. First sign of Gout Flare. Take 2 tablets ( 1.97m) by mouth at first sign of gout flare followed by 1 tablet ( 0.697m after 1 hour. ( max 1.80m42mithin 1 hours)  . cyanocobalamin 1000 MCG tablet Take 1,000 mcg by mouth every other day.  . Fludrocortisone Acetate (FLORINEF PO) Take 0.1 mg by mouth every other day. On alternate days take 1/2 tablet.  . furosemide (LASIX) 20 MG tablet Take 20 mg by mouth as needed. For Edema.  . gabapentin (NEURONTIN) 600 MG tablet Take 900 mg by mouth daily. Takes 1 and 1/2 tablet by mouth nightly.  . gMarland Kitchenucose blood (ACCU-CHEK AVIVA PLUS) test strip Use to test blood sugar once daily. Dx: E11.49  . hydrocortisone (CORTEF) 10 MG tablet Take 10 mg by mouth daily.  Takes 1 and 1/2 tablet every morning, 1/2 tablet every afternoon, and 1/2 tablet every evening. Take double dose as directed for stress, may take up to 85 tablets monthly.  . levothyroxine (SYNTHROID) 75 MCG tablet Take 75 mcg by mouth daily.  . metFORMIN (GLUMETZA) 500 MG (MOD) 24 hr tablet Take 500 mg by mouth daily.  . Multiple Vitamins-Minerals (PRESERVISION AREDS 2 PO) Take 1 capsule by mouth in the morning and at bedtime.  . nortriptyline (PAMELOR) 10 MG capsule Start Nortriptyline (Pamelor) 10 mg nightly for one week, then increase to 20 mg nightly  . PSYLLIUM HUSK PO Take 3.4 g by mouth every evening.   No facility-administered encounter medications on file as of 01/20/2021.    Review of Systems  Constitutional: Negative for appetite change,  chills, fatigue, fever and unexpected weight change.  HENT: Negative for congestion, dental problem, ear discharge, ear pain, facial swelling, hearing loss, nosebleeds, postnasal drip, rhinorrhea, sinus pressure, sinus pain, sneezing, sore throat, tinnitus and trouble swallowing.   Eyes: Negative for pain, discharge, redness, itching and visual disturbance.  Respiratory: Negative for cough, chest tightness, shortness of breath and wheezing.   Cardiovascular: Negative for chest pain, palpitations and leg swelling.  Gastrointestinal: Negative for abdominal distention, abdominal pain, blood in stool, constipation, diarrhea, nausea and vomiting.  Endocrine: Negative for cold intolerance, heat intolerance, polydipsia, polyphagia and polyuria.  Genitourinary: Negative for difficulty urinating, dysuria, flank pain, frequency and urgency.  Musculoskeletal: Positive for gait problem. Negative for arthralgias, back pain, joint swelling, myalgias, neck pain and neck stiffness.  Skin: Negative for color change, pallor, rash and wound.  Neurological: Positive for dizziness. Negative for syncope, speech difficulty, light-headedness and headaches.       Numbness  and weakness of right leg per HPI   Hematological: Does not bruise/bleed easily.  Psychiatric/Behavioral: Negative for agitation, behavioral problems, confusion, hallucinations, self-injury, sleep disturbance and suicidal ideas. The patient is not nervous/anxious.     Immunization History  Administered Date(s) Administered  . Hepatitis B, adult 06/18/2016, 07/19/2016, 06/18/2017  . Influenza, High Dose Seasonal PF 06/18/2017, 06/18/2018  . Influenza,inj,Quad PF,6+ Mos 06/09/2014, 06/15/2015, 06/18/2016  . Influenza-Unspecified 06/09/2014, 06/15/2015, 06/18/2016, 06/18/2017, 06/16/2019  . Moderna Sars-Covid-2 Vaccination 10/13/2019, 11/10/2019, 05/17/2020  . Pneumococcal Conjugate-13 05/13/2014  . Pneumococcal Polysaccharide-23 05/04/2011  . Tdap 06/03/2013  . Zoster 11/30/2007   Pertinent  Health Maintenance Due  Topic Date Due  . COLONOSCOPY (Pts 45-108yr Insurance coverage will need to be confirmed)  Never done  . INFLUENZA VACCINE  05/01/2021  . PNA vac Low Risk Adult  Completed  . URINE MICROALBUMIN  Discontinued   Fall Risk  01/20/2021 01/12/2020  Falls in the past year? 1 1  Number falls in past yr: 1 1  Injury with Fall? 0 0   Functional Status Survey:    Vitals:   01/20/21 1135  BP: 110/70  Pulse: 88  Resp: 18  Temp: 97.8 F (36.6 C)  SpO2: 98%  Weight: 207 lb 3.2 oz (94 kg)  Height: '5\' 8"'  (1.727 m)   Body mass index is 31.5 kg/m. Physical Exam Vitals reviewed.  Constitutional:      General: He is not in acute distress.    Appearance: Normal appearance. He is obese. He is not ill-appearing or diaphoretic.  HENT:     Head: Normocephalic.     Right Ear: Tympanic membrane, ear canal and external ear normal. There is no impacted cerumen.     Left Ear: Tympanic membrane, ear canal and external ear normal. There is no impacted cerumen.     Nose: Nose normal. No congestion or rhinorrhea.     Mouth/Throat:     Mouth: Mucous membranes are moist.     Pharynx:  Oropharynx is clear. No oropharyngeal exudate or posterior oropharyngeal erythema.  Eyes:     General: No scleral icterus.       Right eye: No discharge.        Left eye: No discharge.     Extraocular Movements: Extraocular movements intact.     Conjunctiva/sclera: Conjunctivae normal.     Pupils: Pupils are equal, round, and reactive to light.  Neck:     Vascular: No carotid bruit.  Cardiovascular:     Rate and Rhythm: Normal rate and regular rhythm.  Pulses: Normal pulses.     Heart sounds: Normal heart sounds. No murmur heard. No friction rub. No gallop.   Pulmonary:     Effort: Pulmonary effort is normal. No respiratory distress.     Breath sounds: Normal breath sounds. No wheezing, rhonchi or rales.  Chest:     Chest wall: No tenderness.  Abdominal:     General: Bowel sounds are normal. There is no distension.     Palpations: Abdomen is soft. There is no mass.     Tenderness: There is no abdominal tenderness. There is no right CVA tenderness, left CVA tenderness, guarding or rebound.  Musculoskeletal:        General: No swelling or tenderness.     Cervical back: Normal range of motion. No rigidity or tenderness.     Right lower leg: No edema.     Left lower leg: No edema.     Comments: Unsteady gait   Lymphadenopathy:     Cervical: No cervical adenopathy.  Skin:    General: Skin is warm and dry.     Coloration: Skin is not pale.     Findings: No bruising, erythema, lesion or rash.  Neurological:     Mental Status: He is alert and oriented to person, place, and time.     Cranial Nerves: No cranial nerve deficit.     Sensory: No sensory deficit.     Motor: No weakness.     Coordination: Coordination normal.     Gait: Gait abnormal.  Psychiatric:        Mood and Affect: Mood normal.        Speech: Speech normal.        Behavior: Behavior normal.        Thought Content: Thought content normal.        Judgment: Judgment normal.     Labs reviewed: No results for  input(s): NA, K, CL, CO2, GLUCOSE, BUN, CREATININE, CALCIUM, MG, PHOS in the last 8760 hours. No results for input(s): AST, ALT, ALKPHOS, BILITOT, PROT, ALBUMIN in the last 8760 hours. No results for input(s): WBC, NEUTROABS, HGB, HCT, MCV, PLT in the last 8760 hours. No results found for: TSH No results found for: HGBA1C No results found for: CHOL, HDL, LDLCALC, LDLDIRECT, TRIG, CHOLHDL  Significant Diagnostic Results in last 30 days:  No results found.  Assessment/Plan  1. Dizziness Worsening symptoms room spinning and floor seems to be moving towards on side.Neuro exam intact.Given reports of right side head numbness and right leg weakness when he had symptoms this morning at 3 Am will obtain CT scan of the head to rule out acute abnormalities.  Advised CT scan of the head order imaging place at [facility] will call you for appointment. - additional Education  information provided on AVS  - advised to go to ED if symptoms worse - CBC with Differential/Platelet - BMP with eGFR([facility]) - CT HEAD WO CONTRAST; Future  2. Right leg weakness Symptoms have improved since 3 AM  CT scan as above  - CT HEAD WO CONTRAST; Future  3. Gait instability Worsening stability.Holds onto the wife when walking.Has walker at home.  - CT HEAD WO CONTRAST; Future  Family/ staff Communication: Reviewed plan of care with patient and wife verbalized understanding  Labs/tests ordered: - CT HEAD WO CONTRAST; Future  Next Appointment: As needed if symptoms worsen or fail to improve    Sandrea Hughs, NP

## 2021-01-21 LAB — BASIC METABOLIC PANEL WITH GFR
BUN: 20 mg/dL (ref 7–25)
CO2: 26 mmol/L (ref 20–32)
Calcium: 9.3 mg/dL (ref 8.6–10.3)
Chloride: 101 mmol/L (ref 98–110)
Creat: 1.07 mg/dL (ref 0.70–1.18)
GFR, Est African American: 78 mL/min/{1.73_m2} (ref >=60)
GFR, Est Non African American: 68 mL/min/{1.73_m2} (ref >=60)
Glucose, Bld: 154 mg/dL — ABNORMAL HIGH (ref 65–139)
Potassium: 4.7 mmol/L (ref 3.5–5.3)
Sodium: 137 mmol/L (ref 135–146)

## 2021-01-21 LAB — CBC WITH DIFFERENTIAL/PLATELET
Absolute Monocytes: 593 cells/uL (ref 200–950)
Basophils Absolute: 53 cells/uL (ref 0–200)
Basophils Relative: 0.7 %
Eosinophils Absolute: 173 cells/uL (ref 15–500)
Eosinophils Relative: 2.3 %
HCT: 44.2 % (ref 38.5–50.0)
Hemoglobin: 14.6 g/dL (ref 13.2–17.1)
Lymphs Abs: 1523 cells/uL (ref 850–3900)
MCH: 28.7 pg (ref 27.0–33.0)
MCHC: 33 g/dL (ref 32.0–36.0)
MCV: 87 fL (ref 80.0–100.0)
MPV: 9.2 fL (ref 7.5–12.5)
Monocytes Relative: 7.9 %
Neutro Abs: 5160 cells/uL (ref 1500–7800)
Neutrophils Relative %: 68.8 %
Platelets: 304 10*3/uL (ref 140–400)
RBC: 5.08 10*6/uL (ref 4.20–5.80)
RDW: 14 % (ref 11.0–15.0)
Total Lymphocyte: 20.3 %
WBC: 7.5 10*3/uL (ref 3.8–10.8)

## 2021-01-23 ENCOUNTER — Other Ambulatory Visit: Payer: Self-pay | Admitting: Family

## 2021-01-23 DIAGNOSIS — R29898 Other symptoms and signs involving the musculoskeletal system: Secondary | ICD-10-CM

## 2021-01-23 DIAGNOSIS — R2681 Unsteadiness on feet: Secondary | ICD-10-CM

## 2021-01-23 DIAGNOSIS — R42 Dizziness and giddiness: Secondary | ICD-10-CM

## 2021-01-27 ENCOUNTER — Telehealth: Payer: Self-pay | Admitting: Nurse Practitioner

## 2021-01-27 NOTE — Telephone Encounter (Signed)
I called patient and left a vm to call us and schedule his awv.

## 2021-02-09 ENCOUNTER — Encounter: Payer: Self-pay | Admitting: Nurse Practitioner

## 2021-02-09 ENCOUNTER — Ambulatory Visit (INDEPENDENT_AMBULATORY_CARE_PROVIDER_SITE_OTHER): Payer: Medicare Other | Admitting: Nurse Practitioner

## 2021-02-09 ENCOUNTER — Other Ambulatory Visit: Payer: Self-pay

## 2021-02-09 ENCOUNTER — Telehealth: Payer: Self-pay

## 2021-02-09 DIAGNOSIS — Z0000 Encounter for general adult medical examination without abnormal findings: Secondary | ICD-10-CM

## 2021-02-09 NOTE — Patient Instructions (Signed)
Mr. Tony Huffman , Thank you for taking time to come for your Medicare Wellness Visit. I appreciate your ongoing commitment to your health goals. Please review the following plan we discussed and let me know if I can assist you in the future.   Screening recommendations/referrals: Colonoscopy aged out Recommended yearly ophthalmology/optometry visit for glaucoma screening and checkup Recommended yearly dental visit for hygiene and checkup  Vaccinations: Influenza vaccine up to date Pneumococcal vaccine up to date Tdap vaccine up to date Shingles vaccine RECOMMENDED to     Advanced directives: on file.   Conditions/risks identified: fall, advanced age.  Next appointment: 1 year   Preventive Care 65 Years and Older, Male Preventive care refers to lifestyle choices and visits with your health care provider that can promote health and wellness. What does preventive care include?  A yearly physical exam. This is also called an annual well check.  Dental exams once or twice a year.  Routine eye exams. Ask your health care provider how often you should have your eyes checked.  Personal lifestyle choices, including:  Daily care of your teeth and gums.  Regular physical activity.  Eating a healthy diet.  Avoiding tobacco and drug use.  Limiting alcohol use.  Practicing safe sex.  Taking low doses of aspirin every day.  Taking vitamin and mineral supplements as recommended by your health care provider. What happens during an annual well check? The services and screenings done by your health care provider during your annual well check will depend on your age, overall health, lifestyle risk factors, and family history of disease. Counseling  Your health care provider may ask you questions about your:  Alcohol use.  Tobacco use.  Drug use.  Emotional well-being.  Home and relationship well-being.  Sexual activity.  Eating habits.  History of falls.  Memory and ability  to understand (cognition).  Work and work Statistician. Screening  You may have the following tests or measurements:  Height, weight, and BMI.  Blood pressure.  Lipid and cholesterol levels. These may be checked every 5 years, or more frequently if you are over 86 years old.  Skin check.  Lung cancer screening. You may have this screening every year starting at age 87 if you have a 30-pack-year history of smoking and currently smoke or have quit within the past 15 years.  Fecal occult blood test (FOBT) of the stool. You may have this test every year starting at age 70.  Flexible sigmoidoscopy or colonoscopy. You may have a sigmoidoscopy every 5 years or a colonoscopy every 10 years starting at age 38.  Prostate cancer screening. Recommendations will vary depending on your family history and other risks.  Hepatitis C blood test.  Hepatitis B blood test.  Sexually transmitted disease (STD) testing.  Diabetes screening. This is done by checking your blood sugar (glucose) after you have not eaten for a while (fasting). You may have this done every 1-3 years.  Abdominal aortic aneurysm (AAA) screening. You may need this if you are a current or former smoker.  Osteoporosis. You may be screened starting at age 60 if you are at high risk. Talk with your health care provider about your test results, treatment options, and if necessary, the need for more tests. Vaccines  Your health care provider may recommend certain vaccines, such as:  Influenza vaccine. This is recommended every year.  Tetanus, diphtheria, and acellular pertussis (Tdap, Td) vaccine. You may need a Td booster every 10 years.  Zoster vaccine.  You may need this after age 12.  Pneumococcal 13-valent conjugate (PCV13) vaccine. One dose is recommended after age 44.  Pneumococcal polysaccharide (PPSV23) vaccine. One dose is recommended after age 79. Talk to your health care provider about which screenings and vaccines  you need and how often you need them. This information is not intended to replace advice given to you by your health care provider. Make sure you discuss any questions you have with your health care provider. Document Released: 10/14/2015 Document Revised: 06/06/2016 Document Reviewed: 07/19/2015 Elsevier Interactive Patient Education  2017 [place] Prevention in the Home Falls can cause injuries. They can happen to people of all ages. There are many things you can do to make your home safe and to help prevent falls. What can I do on the outside of my home?  Regularly fix the edges of walkways and driveways and fix any cracks.  Remove anything that might make you trip as you walk through a door, such as a raised step or threshold.  Trim any bushes or trees on the path to your home.  Use bright outdoor lighting.  Clear any walking paths of anything that might make someone trip, such as rocks or tools.  Regularly check to see if handrails are loose or broken. Make sure that both sides of any steps have handrails.  Any raised decks and porches should have guardrails on the edges.  Have any leaves, snow, or ice cleared regularly.  Use sand or salt on walking paths during winter.  Clean up any spills in your garage right away. This includes oil or grease spills. What can I do in the bathroom?  Use night lights.  Install grab bars by the toilet and in the tub and shower. Do not use towel bars as grab bars.  Use non-skid mats or decals in the tub or shower.  If you need to sit down in the shower, use a plastic, non-slip stool.  Keep the floor dry. Clean up any water that spills on the floor as soon as it happens.  Remove soap buildup in the tub or shower regularly.  Attach bath mats securely with double-sided non-slip rug tape.  Do not have throw rugs and other things on the floor that can make you trip. What can I do in the bedroom?  Use night lights.  Make sure  that you have a light by your bed that is easy to reach.  Do not use any sheets or blankets that are too big for your bed. They should not hang down onto the floor.  Have a firm chair that has side arms. You can use this for support while you get dressed.  Do not have throw rugs and other things on the floor that can make you trip. What can I do in the kitchen?  Clean up any spills right away.  Avoid walking on wet floors.  Keep items that you use a lot in easy-to-reach places.  If you need to reach something above you, use a strong step stool that has a grab bar.  Keep electrical cords out of the way.  Do not use floor polish or wax that makes floors slippery. If you must use wax, use non-skid floor wax.  Do not have throw rugs and other things on the floor that can make you trip. What can I do with my stairs?  Do not leave any items on the stairs.  Make sure that there are handrails on  both sides of the stairs and use them. Fix handrails that are broken or loose. Make sure that handrails are as long as the stairways.  Check any carpeting to make sure that it is firmly attached to the stairs. Fix any carpet that is loose or worn.  Avoid having throw rugs at the top or bottom of the stairs. If you do have throw rugs, attach them to the floor with carpet tape.  Make sure that you have a light switch at the top of the stairs and the bottom of the stairs. If you do not have them, ask someone to add them for you. What else can I do to help prevent falls?  Wear shoes that:  Do not have high heels.  Have rubber bottoms.  Are comfortable and fit you well.  Are closed at the toe. Do not wear sandals.  If you use a stepladder:  Make sure that it is fully opened. Do not climb a closed stepladder.  Make sure that both sides of the stepladder are locked into place.  Ask someone to hold it for you, if possible.  Clearly mark and make sure that you can see:  Any grab bars or  handrails.  First and last steps.  Where the edge of each step is.  Use tools that help you move around (mobility aids) if they are needed. These include:  Canes.  Walkers.  Scooters.  Crutches.  Turn on the lights when you go into a dark area. Replace any light bulbs as soon as they burn out.  Set up your furniture so you have a clear path. Avoid moving your furniture around.  If any of your floors are uneven, fix them.  If there are any pets around you, be aware of where they are.  Review your medicines with your doctor. Some medicines can make you feel dizzy. This can increase your chance of falling. Ask your doctor what other things that you can do to help prevent falls. This information is not intended to replace advice given to you by your health care provider. Make sure you discuss any questions you have with your health care provider. Document Released: 07/14/2009 Document Revised: 02/23/2016 Document Reviewed: 10/22/2014 Elsevier Interactive Patient Education  2017 [employer].

## 2021-02-09 NOTE — Progress Notes (Signed)
Subjective:   Tony Huffman is a 75 y.o. male who presents for Medicare Annual/Subsequent preventive examination.  Review of Systems     Cardiac Risk Factors include: sedentary lifestyle;family history of premature cardiovascular disease;diabetes mellitus;advanced age (>73mn, >>69women);male gender;hypertension     Objective:    There were no vitals filed for this visit. There is no height or weight on file to calculate BMI.  Advanced Directives 02/09/2021 01/20/2021 01/12/2020  Does Patient Have a Medical Advance Directive? Yes Yes Yes  Type of Advance Directive Out of facility DNR (pink MOST or yellow form) Out of facility DNR (pink MOST or yellow form) Out of facility DNR (pink MOST or yellow form);Living will;Healthcare Power of Attorney  Does patient want to make changes to medical advance directive? No - Patient declined No - Patient declined No - Patient declined  [place] of [place]? - - No - copy requested  Pre-existing out of facility DNR order (yellow form or pink MOST form) Pink MOST form placed in chart (order not valid for inpatient use);Yellow form placed in chart (order not valid for inpatient use) - -    Current Medications (verified) Outpatient Encounter Medications as of 02/09/2021  Medication Sig  . Accu-Chek Softclix Lancets lancets Use to check blood sugar once daily. Dx: E11.49  . blood glucose meter kit and supplies 1 each by Other route as directed. Dispense based on patient and insurance preference. Use up to four times daily as directed. (FOR ICD-10 E10.9, E11.9).  .Marland KitchenCholecalciferol 125 MCG (5000 UT) capsule Take 5,000 Units by mouth daily.  . colchicine 0.6 MG tablet Take 0.6 mg by mouth as needed. First sign of Gout Flare. Take 2 tablets ( 1.2333m by mouth at first sign of gout flare followed by 1 tablet ( 0.33m24mafter 1 hour. ( max 1.8mg91mthin 1 hours)  . cyanocobalamin 1000 MCG tablet Take 1,000 mcg by mouth every other day.  .  Fludrocortisone Acetate (FLORINEF PO) Take 0.1 mg by mouth every other day. On alternate days take 1/2 tablet.  . furosemide (LASIX) 20 MG tablet Take 20 mg by mouth as needed. For Edema.  . gabapentin (NEURONTIN) 600 MG tablet Take 900 mg by mouth daily. Takes 1 and 1/2 tablet by mouth nightly.  . glMarland Kitchencose blood (ACCU-CHEK AVIVA PLUS) test strip Use to test blood sugar once daily. Dx: E11.49  . hydrocortisone (CORTEF) 10 MG tablet Take 10 mg by mouth daily. Takes 1 and 1/2 tablet every morning, 1/2 tablet every afternoon, and 1/2 tablet every evening. Take double dose as directed for stress, may take up to 85 tablets monthly.  . levothyroxine (SYNTHROID) 75 MCG tablet Take 75 mcg by mouth daily.  . metFORMIN (GLUMETZA) 500 MG (MOD) 24 hr tablet Take 500 mg by mouth daily.  . Multiple Vitamins-Minerals (PRESERVISION AREDS 2 PO) Take 1 capsule by mouth in the morning and at bedtime.  . nortriptyline (PAMELOR) 10 MG capsule 4 nightly  . PSYLLIUM HUSK PO Take 3.4 g by mouth every evening.   No facility-administered encounter medications on file as of 02/09/2021.    Allergies (verified) Other, Soap, Tegaderm ag mesh [silver], and Tape   History: Past Medical History:  Diagnosis Date  . Abnormal heart rhythm 10/20/2014  . Addison's disease (HCC)[place]. Adrenal insufficiency (HCC)[place]. Allergy   . Ambulates with cane   . Anxiety   . Arrhythmia   . Cataract cortical, senile   .  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  . Chronic kidney disease, stage 3 ([place]) 11/13/2011  . Cough 03/29/2014  . Edema leg 03/07/2017  . Elevated cholesterol with high triglycerides 09/04/2013  . Elevated red blood cell count   . Erectile dysfunction   . Falls frequently   . Fever 03/29/2014  . Generalized weakness 03/29/2014  . GERD (gastroesophageal reflux disease)   . Gout 09/04/2013  . H/O adrenal insufficiency 05/17/2014  . H/O atrial flutter 03/07/2017  . H/O atrial flutter   . H/O eye surgery   . H/O  sebaceous cyst 01/06/2018  . H/O thyroid disease   . H/O upper respiratory infection 10/15/2013  . Hearing loss   . History of back surgery   . History of blepharoplasty   . History of cancer 10/04/2015  . History of chemotherapy   . History of esophagogastroduodenoscopy (EGD)   . History of prediabetes   . Hyperlipidemia 06/16/2014  . Hypertension   . Hypertriglyceridemia   . Hypothyroidism (acquired) 04/14/2014  . Left leg swelling 11/23/2016  . Low HDL (under 40)   . 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[place]) 11/10/2010  . Malignant neoplasm of unspecified kidney, except renal pelvis ([place])   . OSA (obstructive sleep apnea) 07/24/2016  . OSA (obstructive sleep apnea)   . PAF (paroxysmal atrial fibrillation) ([place])   . PAF (paroxysmal atrial fibrillation) ([place])   . Pancreatic mass 08/01/2018  . Renal carcinoma ([place]) 06/16/2015  . RLS (restless legs syndrome) 07/24/2016  . Sleep apnea   . Testicular hypofunction   . Tubular adenoma 10/28/2017  . Tussive syncopes 10/15/2013  . Type 2 diabetes mellitus ([place])   . Vitamin D deficiency disease    Past Surgical History:  Procedure Laterality Date  . ADRENALECTOMY    . CATARACT EXTRACTION  10/2014  . CATARACT EXTRACTION EXTRACAPSULAR  11/05/2016   with Insertion Intraocular Prostheis.   . COLONOSCOPY  05/06/2017   with removal lesions by snare.  . COLONOSCOPY W/ BIOPSIES     05/06/2017  . NEPHRECTOMY    . POPLITEAL SYNOVIAL CYST EXCISION    . [place]  . THYROIDECTOMY    . THYROIDECTOMY, PARTIAL    . TONSILLECTOMY     Family History  Problem Relation Age of Onset  . Ovarian cancer Mother   . Cancer Mother   . Cancer Father   . Heart disease Father   . Coronary artery disease Father   . Hypertension Father   . Macular degeneration Father   . Heart disease Brother   . Diabetes Brother   . Coronary artery disease Brother   . Diabetes type II Brother   . Coronary artery disease Paternal Grandfather     Social History   Socioeconomic History  . Marital status: Unknown    Spouse name: Not on file  . Number of children: Not on file  . Years of education: Not on file  . Highest education level: Not on file  Occupational History  . Not on file  Tobacco Use  . Smoking status: Former Smoker    Packs/day: 1.00    Years: 10.00    Pack years: 10.00    Types: Cigarettes    Quit date: 1978    Years since quitting: 44.3  . Smokeless tobacco: Never Used  Vaping Use  . Vaping Use: Never used  Substance and Sexual Activity  . Alcohol use: Not Currently  . Drug use: Never  . Sexual activity: Not on file  Other  Topics Concern  . Not on file  Social History Narrative   Tobacco use, amount per day now: None.   Past tobacco use, amount per day: 1 pack a day.   How many years did you use tobacco: 10    Alcohol use (drinks per week): None   Diet: Vegetarian.    Do you drink/eat things with caffeine: No   Marital status: Married                                 What year were you married? 2002   Do you live in a house, apartment, assisted living, condo, trailer, etc.? House   Is it one or more stories? One   How many persons live in your home? 2   Do you have pets in your home?( please list)    Highest Level of education completed: Bachelors in Music.   Current or past profession: Scientist, research (physical sciences).   Do you exercise? Yes                                     Type and how often? Balance    Do you have a living will? Yes   Do you have a DNR form?  Yes                                 If not, do you want to discuss one?   Do you have signed POA/HPOA forms? Yes                       If so, please bring to you appointment    Do you have any difficulty bathing or dressing yourself? No   Do you have difficulty preparing food or eating? No   Do you have difficulty managing your medications? No    Do you have any difficulty managing your finances? No   Do you have any difficulty affording your  medications? No      Social Determinants of Radio broadcast assistant Strain: Not on file  Food Insecurity: Not on file  Transportation Needs: Not on file  Physical Activity: Not on file  Stress: Not on file  Social Connections: Not on file    Tobacco Counseling Counseling given: Not Answered   Clinical Intake:  Pre-visit preparation completed: Yes  Pain : No/denies pain     BMI - recorded: 31 Nutritional Risks: None Diabetes: Yes  How often do you need to have someone help you when you read instructions, pamphlets, or other written materials from your doctor or pharmacy?: 1 - Never  Diabetic?yes         Activities of Daily Living In your present state of health, do you have any difficulty performing the following activities: 02/09/2021 02/09/2021  Hearing? Tempie Donning  Vision? Y Y  Difficulty concentrating or making decisions? N -  Walking or climbing stairs? Y -  Dressing or bathing? N -  Doing errands, shopping? N -  Preparing Food and eating ? N -  Using the Toilet? N -  In the past six months, have you accidently leaked urine? Y -  Do you have problems with loss of bowel control? N -  Managing your Medications? N -  Managing your Finances? N -  Housekeeping or managing your Housekeeping? N -  Some recent data might be hidden    Patient Care Team: [name], NP as PCP - General (Geriatric Medicine) [name], MD as Physician Assistant (Endocrinology) [name], MD as Referring Physician (Neurology)  Indicate any recent Medical Services you may have received from other than [facility] providers in the past year (date may be approximate).     Assessment:   This is a routine wellness examination for [place].  Hearing/Vision screen  Hearing Screening   _0  _1  _2  _3  _4  _5  _6  _7  _8   Right ear:           Left ear:           Comments: Patient wears hearing aids.  Vision Screening Comments: Patient wears glasses.  Patient has macular degeneration in left eye. Patient's last eye exam was 2 weeks ago. [facility]  Dietary issues and exercise activities discussed: Current Exercise Habits: Home exercise routine, Type of exercise: walking, Time (Minutes): 20, Frequency (Times/Week): 1, Weekly Exercise (Minutes/Week): 20  Goals Addressed            This Visit's Progress   . Patient Stated       Better control of neuropathy and decrease falls      Depression Screen PHQ 2/9 Scores 02/09/2021  PHQ - 2 Score 0    Fall Risk Fall Risk  02/09/2021 01/20/2021 01/12/2020  Falls in the past year? _9 Number falls in past yr: _10 Injury with Fall? 0 0 0    FALL RISK PREVENTION PERTAINING TO THE HOME:  Any stairs in or around the home? No  If so, are there any without handrails? No  Home free of loose throw rugs in walkways, pet beds, electrical cords, etc? Yes  Adequate lighting in your home to reduce risk of falls? Yes   ASSISTIVE DEVICES UTILIZED TO PREVENT FALLS:  Life alert? Yes  Use of a cane, walker or w/c? Yes  Grab bars in the bathroom? Yes  Shower chair or bench in shower? Yes  Elevated toilet seat or a handicapped toilet? Yes   TIMED UP AND GO:  Was the test performed? No .    Cognitive Function:     6CIT Screen 02/09/2021  What Year? 0 points  What month? 0 points  What time? 0 points  Count back from 20 0 points  Months in reverse 2 points  Repeat phrase 0 points  Total Score 2    Immunizations Immunization History  Administered Date(s) Administered  . Hepatitis B, adult 06/18/2016, 07/19/2016, 06/18/2017  . Influenza, High Dose Seasonal PF 06/18/2017, 06/18/2018  . Influenza,inj,Quad PF,6+ Mos 06/09/2014, 06/15/2015, 06/18/2016  . Influenza-Unspecified 06/09/2014, 06/15/2015, 06/18/2016, 06/18/2017, 06/16/2019  . Moderna Sars-Covid-2 Vaccination 10/13/2019, 11/10/2019, 05/17/2020  . Pneumococcal Conjugate-13 05/13/2014  . Pneumococcal  Polysaccharide-23 05/04/2011  . Tdap 06/03/2013  . Zoster 11/30/2007    TDAP status: Up to date  Flu Vaccine status: Up to date  Pneumococcal vaccine status: Up to date  Covid-19 vaccine status: Completed vaccines  Qualifies for Shingles Vaccine? Yes   Zostavax completed Yes   Shingrix Completed?: No.    Education has been provided regarding the importance of this vaccine. Patient has been advised to call insurance company to determine out of pocket expense if they have not yet received this vaccine. Advised may also receive vaccine at local pharmacy or Health Dept. Verbalized acceptance and understanding.  Screening Tests Health Maintenance  Topic Date Due  . COLONOSCOPY (Pts 45-88yr Insurance coverage will need to be confirmed)  Never done  . COVID-19 Vaccine (4 - Booster for Moderna series) 08/17/2020  . Hepatitis C Screening  04/26/2021 (Originally 08/17/1964)  . INFLUENZA VACCINE  05/01/2021  . TETANUS/TDAP  06/04/2023  . PNA vac Low Risk Adult  Completed  . HPV VACCINES  Aged Out  . URINE MICROALBUMIN  Discontinued    Health Maintenance  Health Maintenance Due  Topic Date Due  . COLONOSCOPY (Pts 45-456yrInsurance coverage will need to be confirmed)  Never done  . COVID-19 Vaccine (4 - Booster for Moderna series) 08/17/2020    Colorectal cancer screening: No longer required.   Lung Cancer Screening: (Low Dose CT Chest recommended if Age 76-80ears, 30 pack-year currently smoking OR have quit w/in 15years.) does not qualify.   Lung Cancer Screening Referral: na  Additional Screening:  Hepatitis C Screening: does qualify; Complete with next blood work  Vision Screening: Recommended annual ophthalmology exams for early detection of glaucoma and other disorders of the eye. Is the patient up to date with their annual eye exam?  Yes  Who is the provider or what is the name of the office in which the patient attends annual eye exams? [facility] If pt is not  established with a provider, would they like to be referred to a provider to establish care? No .   Dental Screening: Recommended annual dental exams for proper oral hygiene  Community Resource Referral / Chronic Care Management: CRR required this visit?  No   CCM required this visit?  No      Plan:     I have personally reviewed and noted the following in the patient's chart:   . Medical and social history . Use of alcohol, tobacco or illicit drugs  . Current medications and supplements including opioid prescriptions. Patient is not currently taking opioid prescriptions. . Functional ability and status . Nutritional status . Physical activity . Advanced directives . List of other physicians . Hospitalizations, surgeries, and ER visits in previous 12 months . Vitals . Screenings to include cognitive, depression, and falls . Referrals and appointments  In addition, I have reviewed and discussed with patient certain preventive protocols, quality metrics, and best practice recommendations. A written personalized care plan for preventive services as well as general preventive health recommendations were provided to patient.     JeLauree ChandlerNP   02/09/2021    Virtual Visit via Telephone Note  I connected with@ on 02/09/21 at  2:15 PM EDT by telephone and verified that I am speaking with the correct person using two identifiers.  Location: Patient: home Provider: [facility]   I discussed the limitations, risks, security and privacy concerns of performing an evaluation and management service by telephone and the availability of in person appointments. I also discussed with the patient that there may be a patient responsible charge related to this service. The patient expressed understanding and agreed to proceed.   I discussed the assessment and treatment plan with the patient. The patient was provided an opportunity to ask questions and all were answered. The patient  agreed with the plan and demonstrated an understanding of the instructions.   The patient was advised to call back or seek an in-person evaluation if the symptoms worsen or if the condition fails to improve as anticipated.  I provided 15 minutes of non-face-to-face time during this encounter.  JeJanett Billow  Beaulah Corin, AGNP Avs printed and mailed

## 2021-02-09 NOTE — Progress Notes (Signed)
This service is provided via telemedicine  No vital signs collected/recorded due to the encounter was a telemedicine visit.   Location of patient (ex: home, work):  Home  Patient consents to a telephone visit:  Yes, see encounter dated 02/09/2021  Location of the provider (ex: office, home):  [place]  Name of any referring provider: N/A  Names of all persons participating in the telemedicine service and their role in the encounter:  Sherrie Mustache, Nurse Practitioner, [name], CMA, and patient.   Time spent on call:  10 minutes with medical assistant

## 2021-02-09 NOTE — Telephone Encounter (Signed)
[name], pickrel are scheduled for a virtual visit with your provider today.    Just as we do with appointments in the office, we must obtain your consent to participate.  Your consent will be active for this visit and any virtual visit you may have with one of our providers in the next 365 days.    If you have a MyChart account, I can also send a copy of this consent to you electronically.  All virtual visits are billed to your insurance company just like a traditional visit in the office.  As this is a virtual visit, video technology does not allow for your provider to perform a traditional examination.  This may limit your provider's ability to fully assess your condition.  If your provider identifies any concerns that need to be evaluated in person or the need to arrange testing such as labs, EKG, etc, we will make arrangements to do so.    Although advances in technology are sophisticated, we cannot ensure that it will always work on either your end or our end.  If the connection with a video visit is poor, we may have to switch to a telephone visit.  With either a video or telephone visit, we are not always able to ensure that we have a secure connection.   I need to obtain your verbal consent now.   Are you willing to proceed with your visit today?   Tony Huffman has provided verbal consent on 02/09/2021 for a virtual visit (video or telephone).   [name], CMA 02/09/2021  1:53 PM

## 2021-03-08 ENCOUNTER — Other Ambulatory Visit: Payer: Self-pay | Admitting: Neurology

## 2021-03-08 DIAGNOSIS — R2689 Other abnormalities of gait and mobility: Secondary | ICD-10-CM

## 2021-03-12 ENCOUNTER — Ambulatory Visit: Payer: Medicare Other

## 2021-03-14 ENCOUNTER — Other Ambulatory Visit: Payer: Self-pay

## 2021-03-14 ENCOUNTER — Ambulatory Visit: Payer: Medicare Other | Admitting: Nurse Practitioner

## 2021-03-14 ENCOUNTER — Encounter: Payer: Self-pay | Admitting: Nurse Practitioner

## 2021-03-14 VITALS — BP 118/80 | HR 83 | Temp 98.3°F | Ht 68.0 in

## 2021-03-14 DIAGNOSIS — G608 Other hereditary and idiopathic neuropathies: Secondary | ICD-10-CM

## 2021-03-14 DIAGNOSIS — M79672 Pain in left foot: Secondary | ICD-10-CM | POA: Diagnosis not present

## 2021-03-14 NOTE — Patient Instructions (Signed)
Tylenol 500 mg 1-2 tablets every 8 hours as needed for pain Ice ~30 mins 3 times daily

## 2021-03-14 NOTE — Progress Notes (Signed)
Careteam: Patient Care Team: [name], NP as PCP - General (Geriatric Medicine) [name], MD as Physician Assistant (Endocrinology) [name], MD as Referring Physician (Neurology)  Advanced Directive information    Allergies  Allergen Reactions   Other Rash    Blisters with bandaids Blisters with bandaids    Soap Itching    Any Deodorant Soap.   Tegaderm Ag Mesh [Silver]    Tape Rash    Blisters, rash. Paper tape OK. Blisters, rash. Paper tape OK.     Chief Complaint  Patient presents with   Acute Visit    Patient having pain in achiiles.Paient has used ice and tylenol. Left side.       HPI: Patient is a 75 y.o. male seen in today at the [facility] for achiles pain. In the past he has used tylenol and ice which has improved pain.  He is dragging left leg and followed by neurology. He has been diagnosised with advanced sensory polyneuropathy. His leg does not function well. He was in respite last week due to wife being out of town and they encouraged him to use walker and take bigger steps. He feels like this may have aggravated heel.  No recent falls.  No fevers.    Review of Systems:  Review of Systems  Constitutional:  Negative for fever.  Musculoskeletal:  Positive for falls and myalgias.  Skin:  Negative for itching and rash.  Neurological:  Positive for weakness.   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   Chronic kidney disease, stage 3 ([place]) 11/13/2011   Cough 03/29/2014   Edema leg 03/07/2017   Elevated cholesterol with high triglycerides 09/04/2013   Elevated red blood cell count    Erectile dysfunction    Falls frequently    Fever 03/29/2014   Generalized weakness 03/29/2014   GERD  (gastroesophageal reflux disease)    Gout 09/04/2013   H/O adrenal insufficiency 05/17/2014   H/O atrial flutter 03/07/2017   H/O atrial flutter    H/O eye surgery    H/O sebaceous cyst 01/06/2018   H/O thyroid disease    H/O upper respiratory infection 10/15/2013   Hearing loss    History of back surgery    History of blepharoplasty    History of cancer 10/04/2015   History of chemotherapy    History of esophagogastroduodenoscopy (EGD)    History of prediabetes    Hyperlipidemia 06/16/2014   Hypertension    Hypertriglyceridemia    Hypothyroidism (acquired) 04/14/2014   Left leg swelling 11/23/2016   Low HDL (under 40)    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[place]) 11/10/2010   Malignant neoplasm of unspecified kidney, except renal pelvis (HCC)    OSA (obstructive sleep apnea) 07/24/2016   OSA (obstructive sleep apnea)    PAF (paroxysmal atrial fibrillation) (HCC)    PAF (paroxysmal atrial fibrillation) ([place])    Pancreatic mass 08/01/2018   Renal carcinoma ([place]) 06/16/2015   RLS (restless legs syndrome) 07/24/2016   Sleep apnea    Testicular hypofunction    Tubular adenoma 10/28/2017   Tussive syncopes 10/15/2013   Type 2 diabetes mellitus ([place])    Vitamin D deficiency disease  Past Surgical History:  Procedure Laterality Date   ADRENALECTOMY     CATARACT EXTRACTION  10/2014   CATARACT EXTRACTION EXTRACAPSULAR  11/05/2016   with Insertion Intraocular Prostheis.    COLONOSCOPY  05/06/2017   with removal lesions by snare.   COLONOSCOPY W/ BIOPSIES     05/06/2017   NEPHRECTOMY     POPLITEAL SYNOVIAL CYST EXCISION     SPINE SURGERY  1989   THYROIDECTOMY     THYROIDECTOMY, PARTIAL     TONSILLECTOMY     Social History:   reports that he quit smoking about 44 years ago. His smoking use included cigarettes. He has a 10.00 pack-year smoking history. He has never used smokeless tobacco. He reports previous alcohol use. He reports that he does not use drugs.  Family  History  Problem Relation Age of Onset   Ovarian cancer Mother    Cancer Mother    Cancer Father    Heart disease Father    Coronary artery disease Father    Hypertension Father    Macular degeneration Father    Heart disease Brother    Diabetes Brother    Coronary artery disease Brother    Diabetes type II Brother    Coronary artery disease Paternal Grandfather     Medications: Patient's Medications  New Prescriptions   No medications on file  Previous Medications   ACCU-CHEK SOFTCLIX LANCETS LANCETS    Use to check blood sugar once daily. Dx: E11.49   BLOOD GLUCOSE METER KIT AND SUPPLIES    1 each by Other route as directed. Dispense based on patient and insurance preference. Use up to four times daily as directed. (FOR ICD-10 E10.9, E11.9).   CHOLECALCIFEROL 125 MCG (5000 UT) CAPSULE    Take 5,000 Units by mouth daily.   COLCHICINE 0.6 MG TABLET    Take 0.6 mg by mouth as needed. First sign of Gout Flare. Take 2 tablets ( 1.$RemoveBef'2mg'cDzbmNFzeI$ ) by mouth at first sign of gout flare followed by 1 tablet ( 0.$RemoveBe'6mg'gOKBfdwnd$ ) after 1 hour. ( max 1.$Remov'8mg'sJFUBe$  within 1 hours)   CYANOCOBALAMIN 1000 MCG TABLET    Take 1,000 mcg by mouth every other day.   FLUDROCORTISONE ACETATE (FLORINEF PO)    Take 0.1 mg by mouth every other day. On alternate days take 1/2 tablet.   FUROSEMIDE (LASIX) 20 MG TABLET    Take 20 mg by mouth as needed. For Edema.   GABAPENTIN (NEURONTIN) 600 MG TABLET    Take 900 mg by mouth daily. Takes 1 and 1/2 tablet by mouth nightly.   GLUCOSE BLOOD (ACCU-CHEK AVIVA PLUS) TEST STRIP    Use to test blood sugar once daily. Dx: E11.49   HYDROCORTISONE (CORTEF) 10 MG TABLET    Take 10 mg by mouth daily. Takes 1 and 1/2 tablet every morning, 1/2 tablet every afternoon, and 1/2 tablet every evening. Take double dose as directed for stress, may take up to 85 tablets monthly.   LEVOTHYROXINE (SYNTHROID) 75 MCG TABLET    Take 75 mcg by mouth daily.   METFORMIN (GLUMETZA) 500 MG (MOD) 24 HR TABLET    Take 500 mg  by mouth daily.   MULTIPLE VITAMINS-MINERALS (PRESERVISION AREDS 2 PO)    Take 1 capsule by mouth in the morning and at bedtime.   NORTRIPTYLINE (PAMELOR) 10 MG CAPSULE    4 nightly   PSYLLIUM HUSK PO    Take 3.4 g by mouth every evening.  Modified Medications   No medications on  file  Discontinued Medications   No medications on file    Physical Exam:  Vitals:   03/14/21 1410  BP: 118/80  Height: $Remove'5\' 8"'yGDkbGG$  (1.727 m)   Body mass index is 31.5 kg/m. Wt Readings from Last 3 Encounters:  01/20/21 207 lb 3.2 oz (94 kg)  09/08/20 200 lb (90.7 kg)  04/26/20 200 lb (90.7 kg)    Physical Exam Constitutional:      General: He is not in acute distress.    Appearance: He is well-developed. He is not diaphoretic.  HENT:     Head: Normocephalic and atraumatic.     Mouth/Throat:     Pharynx: No oropharyngeal exudate.  Eyes:     Conjunctiva/sclera: Conjunctivae normal.     Pupils: Pupils are equal, round, and reactive to light.  Cardiovascular:     Heart sounds: Normal heart sounds.  Pulmonary:     Effort: Pulmonary effort is normal.     Breath sounds: Normal breath sounds.  Abdominal:     General: Bowel sounds are normal.     Palpations: Abdomen is soft.  Musculoskeletal:        General: No tenderness.       Feet:  Feet:     Left foot:     Skin integrity: Skin integrity normal.     Comments: Point tenderness noted to Achilles insertion point, no tenderness over achilles tendon. No tenderness or pain with ROM of foot. Mild swelling noted. No drainage or redness.   Skin:    General: Skin is warm and dry.  Neurological:     Mental Status: He is alert and oriented to person, place, and time.     Gait: Gait abnormal.    Labs reviewed: Basic Metabolic Panel: Recent Labs    01/20/21 1226  NA 137  K 4.7  CL 101  CO2 26  GLUCOSE 154*  BUN 20  CREATININE 1.07  CALCIUM 9.3   Liver Function Tests: No results for input(s): AST, ALT, ALKPHOS, BILITOT, PROT, ALBUMIN in the  last 8760 hours. No results for input(s): LIPASE, AMYLASE in the last 8760 hours. No results for input(s): AMMONIA in the last 8760 hours. CBC: Recent Labs    01/20/21 1226  WBC 7.5  NEUTROABS 5,160  HGB 14.6  HCT 44.2  MCV 87.0  PLT 304   Lipid Panel: No results for input(s): CHOL, HDL, LDLCALC, TRIG, CHOLHDL, LDLDIRECT in the last 8760 hours. TSH: No results for input(s): TSH in the last 8760 hours. A1C: No results found for: HGBA1C   Assessment/Plan 1. Sensory polyneuropathy -could be effecting gait which is exacerbating pain. Will have podiatrist evaluate and treat if needed - Ambulatory referral to Podiatry  2. Pain of left heel possible Achilles tendinopathy- recurring. Educated to use tylenol PRN and ice. Rest beneficial. He is using cane vs walker at this time. PT has signed off. May benefit from orthotic    - Ambulatory referral to [place] [place] [phone]

## 2021-03-16 ENCOUNTER — Encounter: Payer: Self-pay | Admitting: Podiatry

## 2021-03-16 ENCOUNTER — Other Ambulatory Visit: Payer: Self-pay

## 2021-03-16 ENCOUNTER — Ambulatory Visit (INDEPENDENT_AMBULATORY_CARE_PROVIDER_SITE_OTHER): Payer: Medicare Other | Admitting: Podiatry

## 2021-03-16 ENCOUNTER — Ambulatory Visit (INDEPENDENT_AMBULATORY_CARE_PROVIDER_SITE_OTHER): Payer: Medicare Other

## 2021-03-16 DIAGNOSIS — E786 Lipoprotein deficiency: Secondary | ICD-10-CM | POA: Insufficient documentation

## 2021-03-16 DIAGNOSIS — M7662 Achilles tendinitis, left leg: Secondary | ICD-10-CM

## 2021-03-16 DIAGNOSIS — E271 Primary adrenocortical insufficiency: Secondary | ICD-10-CM | POA: Insufficient documentation

## 2021-03-18 ENCOUNTER — Ambulatory Visit
Admission: RE | Admit: 2021-03-18 | Discharge: 2021-03-18 | Disposition: A | Discharge status: Home / Self Care | Payer: Medicare Other | Source: Ambulatory Visit | Attending: Neurology | Admitting: Neurology

## 2021-03-18 ENCOUNTER — Other Ambulatory Visit: Payer: Self-pay

## 2021-03-18 DIAGNOSIS — R2689 Other abnormalities of gait and mobility: Secondary | ICD-10-CM | POA: Insufficient documentation

## 2021-03-21 ENCOUNTER — Encounter: Payer: Self-pay | Admitting: Podiatry

## 2021-03-21 NOTE — Progress Notes (Signed)
Subjective:  Patient ID: Tony Huffman, male    DOB: 1946-05-12,  MRN: [id number]  Chief Complaint  Patient presents with   Foot Pain    Patient presents today for left heel/achilles pain and swelling x 4 days    75 y.o. male presents with the above complaint.  Patient presents with new complaint of left posterior heel pain that has been going on for past 4 days.  There is pain and swelling associated with it.  Patient states is painful to touch.  He would like to discuss treatment options.  He has tried Tylenol and icing.  Pain scale 7 out of 10 and is very tender to touch.  Hurts that keeps him up at night.  He would like to discuss treatment options he denies any other acute complaints.   Review of Systems: Negative except as noted in the HPI. Denies N/V/F/Ch.  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   Chronic kidney disease, stage 3 ([place]) 11/13/2011   Cough 03/29/2014   Edema leg 03/07/2017   Elevated cholesterol with high triglycerides 09/04/2013   Elevated red blood cell count    Erectile dysfunction    Falls frequently    Fever 03/29/2014   Generalized weakness 03/29/2014   GERD (gastroesophageal reflux disease)    Gout 09/04/2013   H/O adrenal insufficiency 05/17/2014   H/O atrial flutter 03/07/2017   H/O atrial flutter    H/O eye surgery    H/O sebaceous cyst 01/06/2018   H/O thyroid disease    H/O upper respiratory infection 10/15/2013   Hearing loss    History of back surgery    History of blepharoplasty    History of cancer 10/04/2015   History of chemotherapy    History of esophagogastroduodenoscopy (EGD)    History of prediabetes    Hyperlipidemia 06/16/2014   Hypertension    Hypertriglyceridemia    Hypothyroidism (acquired) 04/14/2014    Left leg swelling 11/23/2016   Low HDL (under 40)    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[place]) 11/10/2010   Malignant neoplasm of unspecified kidney, except renal pelvis (HCC)    OSA (obstructive sleep apnea) 07/24/2016   OSA (obstructive sleep apnea)    PAF (paroxysmal atrial fibrillation) (HCC)    PAF (paroxysmal atrial fibrillation) ([place])    Pancreatic mass 08/01/2018   Renal carcinoma ([place]) 06/16/2015   RLS (restless legs syndrome) 07/24/2016   Sleep apnea    Testicular hypofunction    Tubular adenoma 10/28/2017   Tussive syncopes 10/15/2013   Type 2 diabetes mellitus ([place])    Vitamin D deficiency disease     Current Outpatient Medications:    Accu-Chek Softclix Lancets lancets, Use to check blood sugar once daily. Dx: E11.49, Disp: 100 each, Rfl: 3   blood glucose meter kit and supplies, 1 each by Other route as directed. Dispense based on patient and insurance preference. Use up to four times daily as directed. (FOR ICD-10 E10.9, E11.9)., Disp: , Rfl:    Cholecalciferol 125 MCG (5000 UT) capsule, Take 5,000 Units by mouth daily., Disp: , Rfl:    colchicine 0.6 MG tablet, Take 0.6 mg by mouth as needed. First sign of Gout Flare. Take 2 tablets ( 1.$RemoveBef'2mg'vNsdhXzlxZ$ ) by mouth  at first sign of gout flare followed by 1 tablet ( 0.$RemoveBe'6mg'kWrOIcasj$ ) after 1 hour. ( max 1.$Remov'8mg'lokIqc$  within 1 hours), Disp: , Rfl:    cyanocobalamin 1000 MCG tablet, Take 1,000 mcg by mouth every other day., Disp: , Rfl:    Fludrocortisone Acetate (FLORINEF PO), Take 0.1 mg by mouth every other day. On alternate days take 1/2 tablet., Disp: , Rfl:    furosemide (LASIX) 20 MG tablet, Take 20 mg by mouth as needed. For Edema., Disp: , Rfl:    gabapentin (NEURONTIN) 600 MG tablet, Take 900 mg by mouth daily. Takes 1 and 1/2 tablet by mouth nightly., Disp: , Rfl:    glucose blood (ACCU-CHEK AVIVA PLUS) test strip, Use to test blood sugar once daily. Dx: E11.49, Disp: 100 each, Rfl: 3   hydrocortisone (CORTEF) 10 MG tablet, Take 10 mg by mouth  daily. Takes 1 and 1/2 tablet every morning, 1/2 tablet every afternoon, and 1/2 tablet every evening. Take double dose as directed for stress, may take up to 85 tablets monthly., Disp: , Rfl:    levothyroxine (SYNTHROID) 75 MCG tablet, Take 75 mcg by mouth daily., Disp: , Rfl:    metFORMIN (GLUMETZA) 500 MG (MOD) 24 hr tablet, Take 500 mg by mouth daily., Disp: , Rfl:    Multiple Vitamins-Minerals (PRESERVISION AREDS 2 PO), Take 1 capsule by mouth in the morning and at bedtime., Disp: , Rfl:    nortriptyline (PAMELOR) 10 MG capsule, 4 nightly, Disp: , Rfl:    PSYLLIUM HUSK PO, Take 3.4 g by mouth every evening., Disp: , Rfl:   Social History   Tobacco Use  Smoking Status Former   Packs/day: 1.00   Years: 10.00   Pack years: 10.00   Types: Cigarettes   Quit date: 1978   Years since quitting: 44.4  Smokeless Tobacco Never    Allergies  Allergen Reactions   Other Rash    Blisters with bandaids Blisters with bandaids    Soap Itching    Any Deodorant Soap.   Tegaderm Ag Mesh [Silver]    Tape Rash    Blisters, rash. Paper tape OK. Blisters, rash. Paper tape OK.    Objective:  There were no vitals filed for this visit. There is no height or weight on file to calculate BMI. Constitutional Well developed. Well nourished.  Vascular Dorsalis pedis pulses palpable bilaterally. Posterior tibial pulses palpable bilaterally. Capillary refill normal to all digits.  No cyanosis or clubbing noted. Pedal hair growth normal.  Neurologic Normal speech. Oriented to person, place, and time. Epicritic sensation to light touch grossly present bilaterally.  Dermatologic Nails well groomed and normal in appearance. No open wounds. No skin lesions.  Orthopedic: Pain on palpation left posterior Achilles tendon insertion.  Normal no pain along the course of the Achilles tendon pain with dorsiflexion of the ankle joint no pain with plantarflexion of the ankle joint.  No pain at the posterior  tibial tendon, peroneal tendon, ATFL ligament.  No deep ankle intra-articular pain noted.  Positive Silfverskiold test with gastrocnemius equinus   Radiographs: 3 views of skeletally mature adult left foot: Posterior heel spurring noted plantar spurring noted no other bony abnormalities identified. Assessment:   1. Achilles tendinitis of left lower extremity    Plan:  Patient was evaluated and treated and all questions answered.  Left Achilles tendinitis with posterior spurring -I explained to the patient the etiology of Achilles tendinitis and various treatment options were discussed.  Given the amount of pain that  he is having I believe patient will benefit from steroid injection to help decrease acute inflammatory component associated pain.  If there is no resolve patient will need an MRI and possible surgical intervention. -We will also place himself in the cam boot.  He already has cam boot at home and often asked him to place himself in it.  He states understanding.  No follow-ups on file.

## 2021-03-22 ENCOUNTER — Telehealth: Payer: Self-pay | Admitting: Adult Health Nurse Practitioner

## 2021-03-22 NOTE — Telephone Encounter (Signed)
Called to remind of visit on 03/27/21 @ 2p.  Left VM with reason for call and call back info Dell Hurtubise K. Olena Heckle NP

## 2021-03-27 ENCOUNTER — Other Ambulatory Visit: Payer: Self-pay

## 2021-03-27 ENCOUNTER — Encounter: Payer: Self-pay | Admitting: Adult Health Nurse Practitioner

## 2021-03-27 ENCOUNTER — Other Ambulatory Visit: Payer: Medicare Other | Admitting: Adult Health Nurse Practitioner

## 2021-03-27 VITALS — BP 142/78 | HR 86

## 2021-03-27 DIAGNOSIS — R42 Dizziness and giddiness: Secondary | ICD-10-CM

## 2021-03-27 DIAGNOSIS — C649 Malignant neoplasm of unspecified kidney, except renal pelvis: Secondary | ICD-10-CM

## 2021-03-27 DIAGNOSIS — R296 Repeated falls: Secondary | ICD-10-CM

## 2021-03-27 DIAGNOSIS — Z515 Encounter for palliative care: Secondary | ICD-10-CM

## 2021-03-27 NOTE — Progress Notes (Signed)
Therapist, nutritional Palliative Care Consult Note Telephone: [phone]  Fax: [phone]    Date of encounter: 03/27/21 PATIENT NAME: Tony Huffman [address] [place] [address]   [phone] (home)  DOB: 1945/11/13 MRN: [id number] PRIMARY CARE PROVIDER:    Sharon Seller, NP,  [address] [place]. [place] [address] [phone]  REFERRING PROVIDER:   Sharon Seller, NP [address] [place],  [place] [address] [phone]  RESPONSIBLE PARTY:    Contact Information     Name Relation Home Work Mobile   [name], Lariccia Spouse   [phone]        I met face to face with patient  in home. Palliative Care was asked to follow this patient by consultation request of  Sharon Seller, NP to address advance care planning and complex medical decision making. This is a follow up visit.                                   ASSESSMENT AND PLAN / RECOMMENDATIONS:   Advance Care Planning/Goals of Care: Goals include to maximize quality of life and symptom management.  CODE STATUS:  DNR  Symptom Management/Plan:  Metastatic renal cancer: Continued wishes for no further treatment  Dizziness: Has been seen by neurology for this and MRI did not show any acute findings.  Possible TIA.  Frequent falls: Continues follow-up with neurology.  Is working with PT.  Continue PT as ordered   Follow up Palliative Care Visit: Palliative care will continue to follow for complex medical decision making, advance care planning, and clarification of goals. Return 8 weeks or prn.  Encouraged to call with any questions or concerns  I spent 45 minutes providing this consultation. More than 50% of the time in this consultation was spent in counseling and care coordination.  PPS: 50%  HOSPICE ELIGIBILITY/DIAGNOSIS: TBD  Chief Complaint: follow up palliative visit  HISTORY OF PRESENT ILLNESS:  Tony Huffman is a 75 y.o. year old male  with Addison's disease,  acquired adrenal insufficiency, hypothyroidism s/p partial thyroidectomy, anxiety, chronic gouty arthritis, DMT2, HLD, THN, OSA, a-fib, RLS, vitamin D deficiency, h/o of renal carcinoma with nephrectomy and removal of adrenal glands, frequent falls .  Patient was having persistent dizziness earlier this month on 03/19/2021 MRI of brain showed no acute abnormality but did show atrophy and white matter disease.  He states that he was told that this could have been a possible TIA.  Continues to get relief with gabapentin and nortriptyline for his peripheral neuropathy.  He continues to use cane due to his balance issues.  His wife has been away and [place] for her job and he has home health aides that have been coming in and helping him with his care.  He is able to prepare his meals and adequately take care of his medications.  He is able to perform ADLs independently but does really require some standby assist but mostly just someone there in case he does fall.  Rest of 10 point ROS asked and negative except what is stated in HPI.  History obtained from review of EMR and interview with Tony Huffman.   PHYSICAL EXAM:   General: patient in NAD Eyes: sclera anicteric and noninjected with no discharge noted ENMT; moist mucous membranes Cardiovascular: regular rate and rhythm Pulmonary: diminished air flow in LLL though no adventitious breath sounds heard; normal respiratory effort Abdomen:  soft, nontender, + bowel sounds Extremities: trace edema to ankles, no joint deformities Skin: no rashes on exposed skin Neurological: A&O x3   Thank you for the opportunity to participate in the care of Tony Huffman.  The palliative care team will continue to follow. Please call our office at [phone] if we can be of additional assistance.   Percy Winterrowd Jenetta Downer, NP , DNP  This chart was dictated using voice recognition software.  Despite best efforts to proofread,  errors can occur which can change the documentation  meaning.   COVID-19 PATIENT SCREENING TOOL Asked and negative response unless otherwise noted:   Have you had symptoms of covid, tested positive or been in contact with someone with symptoms/positive test in the past 5-10 days?

## 2021-04-07 LAB — TSH: TSH: 2.51 (ref 0.41–5.90)

## 2021-04-07 LAB — HEMOGLOBIN A1C: Hemoglobin A1C: 6.5

## 2021-04-07 LAB — VITAMIN B12: Vitamin B-12: 897

## 2021-04-13 ENCOUNTER — Ambulatory Visit (INDEPENDENT_AMBULATORY_CARE_PROVIDER_SITE_OTHER): Payer: Medicare Other | Admitting: Podiatry

## 2021-04-13 ENCOUNTER — Encounter: Payer: Self-pay | Admitting: Podiatry

## 2021-04-13 ENCOUNTER — Other Ambulatory Visit: Payer: Self-pay

## 2021-04-13 DIAGNOSIS — M7662 Achilles tendinitis, left leg: Secondary | ICD-10-CM | POA: Diagnosis not present

## 2021-04-13 DIAGNOSIS — L539 Erythematous condition, unspecified: Secondary | ICD-10-CM

## 2021-04-13 MED ORDER — DOXYCYCLINE HYCLATE 100 MG PO TABS: 100.0000 mg | ORAL_TABLET | Freq: Two times a day (BID) | ORAL | 0 refills | Status: AC

## 2021-04-13 NOTE — Progress Notes (Signed)
Subjective:  Patient ID: Tony Huffman, male    DOB: Nov 20, 1945,  MRN: [id number]  Chief Complaint  Patient presents with   Foot Pain    PT stated that he is doing well but is concerned about a red place on his right foot     75 y.o. male presents with the above complaint.  Patient presents with a follow-up on left Achilles tendinitis.  Patient states he is doing a lot better he no longer has soreness.  It has completely resolved.  He denies any other acute complaints cam boot immobilization help.  He has secondary complaint of right lateral dorsal redness.  Patient is a came out of nowhere he denies any trauma.  He thinks it may be a little bit of erythema.  He would like to know if he can get antibiotics.  He has had it in the past and had antibiotics to resolve it.   Review of Systems: Negative except as noted in the HPI. Denies N/V/F/Ch.  Past Medical History:  Diagnosis Date   Abnormal heart rhythm 10/20/2014   Addison's disease ([place])    Adrenal insufficiency (HCC)    Allergy    Ambulates with cane    Anxiety    Arrhythmia    Cataract cortical, senile    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   Chronic kidney disease, stage 3 ([place]) 11/13/2011   Cough 03/29/2014   Edema leg 03/07/2017   Elevated cholesterol with high triglycerides 09/04/2013   Elevated red blood cell count    Erectile dysfunction    Falls frequently    Fever 03/29/2014   Generalized weakness 03/29/2014   GERD (gastroesophageal reflux disease)    Gout 09/04/2013   H/O adrenal insufficiency 05/17/2014   H/O atrial flutter 03/07/2017   H/O atrial flutter    H/O eye surgery    H/O sebaceous cyst 01/06/2018   H/O thyroid disease    H/O upper respiratory infection 10/15/2013   Hearing loss    History of back surgery    History of blepharoplasty    History of cancer 10/04/2015   History of chemotherapy    History of esophagogastroduodenoscopy (EGD)    History of prediabetes    Hyperlipidemia 06/16/2014    Hypertension    Hypertriglyceridemia    Hypothyroidism (acquired) 04/14/2014   Left leg swelling 11/23/2016   Low HDL (under 40)    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[place]) 11/10/2010   Malignant neoplasm of unspecified kidney, except renal pelvis (HCC)    OSA (obstructive sleep apnea) 07/24/2016   OSA (obstructive sleep apnea)    PAF (paroxysmal atrial fibrillation) (HCC)    PAF (paroxysmal atrial fibrillation) ([place])    Pancreatic mass 08/01/2018   Renal carcinoma ([place]) 06/16/2015   RLS (restless legs syndrome) 07/24/2016   Sleep apnea    Testicular hypofunction    Tubular adenoma 10/28/2017   Tussive syncopes 10/15/2013   Type 2 diabetes mellitus (HCC)    Vitamin D deficiency disease     Current Outpatient Medications:    doxycycline (VIBRA-TABS) 100 MG tablet, Take 1 tablet (100 mg total) by mouth 2 (two) times daily for 10 days., Disp: 20 tablet, Rfl: 0   Accu-Chek Softclix Lancets lancets, Use to check blood sugar once daily. Dx: E11.49, Disp: 100 each, Rfl: 3   blood glucose meter kit and supplies, 1 each by Other route as directed. Dispense based on patient and insurance preference. Use up to four times daily as directed. (FOR ICD-10 E10.9, E11.9)., Disp: ,  Rfl:    Cholecalciferol 125 MCG (5000 UT) capsule, Take 5,000 Units by mouth daily., Disp: , Rfl:    colchicine 0.6 MG tablet, Take 0.6 mg by mouth as needed. First sign of Gout Flare. Take 2 tablets ( 1.$RemoveBef'2mg'nSkJjotegA$ ) by mouth at first sign of gout flare followed by 1 tablet ( 0.$RemoveBe'6mg'TzaLXdptr$ ) after 1 hour. ( max 1.$Remov'8mg'CjTGpP$  within 1 hours), Disp: , Rfl:    cyanocobalamin 1000 MCG tablet, Take 1,000 mcg by mouth every other day., Disp: , Rfl:    Fludrocortisone Acetate (FLORINEF PO), Take 0.1 mg by mouth every other day. On alternate days take 1/2 tablet., Disp: , Rfl:    furosemide (LASIX) 20 MG tablet, Take 20 mg by mouth as needed. For Edema., Disp: , Rfl:    gabapentin (NEURONTIN) 600 MG tablet, Take 900 mg by mouth daily. Takes 1 and 1/2 tablet  by mouth nightly., Disp: , Rfl:    glucose blood (ACCU-CHEK AVIVA PLUS) test strip, Use to test blood sugar once daily. Dx: E11.49, Disp: 100 each, Rfl: 3   hydrocortisone (CORTEF) 10 MG tablet, Take 10 mg by mouth daily. Takes 1 and 1/2 tablet every morning, 1/2 tablet every afternoon, and 1/2 tablet every evening. Take double dose as directed for stress, may take up to 85 tablets monthly., Disp: , Rfl:    levothyroxine (SYNTHROID) 75 MCG tablet, Take 75 mcg by mouth daily., Disp: , Rfl:    metFORMIN (GLUMETZA) 500 MG (MOD) 24 hr tablet, Take 500 mg by mouth daily., Disp: , Rfl:    Multiple Vitamins-Minerals (PRESERVISION AREDS 2 PO), Take 1 capsule by mouth in the morning and at bedtime., Disp: , Rfl:    nortriptyline (PAMELOR) 10 MG capsule, 4 nightly, Disp: , Rfl:    PSYLLIUM HUSK PO, Take 3.4 g by mouth every evening., Disp: , Rfl:   Social History   Tobacco Use  Smoking Status Former   Packs/day: 1.00   Years: 10.00   Pack years: 10.00   Types: Cigarettes   Quit date: 1978   Years since quitting: 44.5  Smokeless Tobacco Never    Allergies  Allergen Reactions   Other Rash    Blisters with bandaids Blisters with bandaids    Soap Itching    Any Deodorant Soap.   Tegaderm Ag Mesh [Silver]    Tape Rash    Blisters, rash. Paper tape OK. Blisters, rash. Paper tape OK.    Objective:  There were no vitals filed for this visit. There is no height or weight on file to calculate BMI. Constitutional Well developed. Well nourished.  Vascular Dorsalis pedis pulses palpable bilaterally. Posterior tibial pulses palpable bilaterally. Capillary refill normal to all digits.  No cyanosis or clubbing noted. Pedal hair growth normal.  Neurologic Normal speech. Oriented to person, place, and time. Epicritic sensation to light touch grossly present bilaterally.  Dermatologic Nails well groomed and normal in appearance. No open wounds. No skin lesions.  Orthopedic: Pain on palpation  left posterior Achilles tendon insertion.  Normal no pain along the course of the Achilles tendon pain with dorsiflexion of the ankle joint no pain with plantarflexion of the ankle joint.  No pain at the posterior tibial tendon, peroneal tendon, ATFL ligament.  No deep ankle intra-articular pain noted.  Positive Silfverskiold test with gastrocnemius equinus   Radiographs: 3 views of skeletally mature adult left foot: Posterior heel spurring noted plantar spurring noted no other bony abnormalities identified. Assessment:   1. Achilles tendinitis of left lower  extremity   2. Erythema     Plan:  Patient was evaluated and treated and all questions answered.  Left Achilles tendinitis with posterior spurring -Clinically resolved with cam boot immobilization.  If any foot and ankle issues arise future I will asked her to come back and see me.  He states understanding.  Right lateral foot erythema without history of trauma -I explained patient etiology of erythema and various treatment options were possibly discussed.  Patient does not recall any soft tissue contusion.  However given that he has a history of similar situation where antibiotics have resolved I believe patient will benefit from doxycycline course for 10 days. -If there is no resolve meant will discuss other options including gout flare.  Patient states understanding  No follow-ups on file.

## 2021-05-16 ENCOUNTER — Ambulatory Visit: Payer: Medicare Other | Admitting: Nurse Practitioner

## 2021-05-16 ENCOUNTER — Other Ambulatory Visit: Payer: Self-pay

## 2021-05-16 VITALS — BP 120/77 | HR 86 | Temp 98.4°F | Ht 68.0 in | Wt 205.0 lb

## 2021-05-16 DIAGNOSIS — C642 Malignant neoplasm of left kidney, except renal pelvis: Secondary | ICD-10-CM

## 2021-05-16 DIAGNOSIS — R29898 Other symptoms and signs involving the musculoskeletal system: Secondary | ICD-10-CM

## 2021-05-16 DIAGNOSIS — R2681 Unsteadiness on feet: Secondary | ICD-10-CM | POA: Diagnosis not present

## 2021-05-16 DIAGNOSIS — G608 Other hereditary and idiopathic neuropathies: Secondary | ICD-10-CM | POA: Diagnosis not present

## 2021-05-16 DIAGNOSIS — M109 Gout, unspecified: Secondary | ICD-10-CM

## 2021-05-16 MED ORDER — COLCHICINE 0.6 MG PO TABS: 0.6000 mg | ORAL_TABLET | ORAL | 1 refills | Status: DC | PRN

## 2021-05-16 NOTE — Progress Notes (Signed)
Careteam: Patient Care Team: [name], NP as PCP - General (Geriatric Medicine) [name], MD as Physician Assistant (Endocrinology) [name], MD as Referring Physician (Neurology)  PLACE OF SERVICE:  [facility]  Advanced Directive information    Allergies  Allergen Reactions   Other Rash    Blisters with bandaids Blisters with bandaids    Soap Itching    Any Deodorant Soap.   Tegaderm Ag Mesh [Silver]    Tape Rash    Blisters, rash. Paper tape OK. Blisters, rash. Paper tape OK.     Chief Complaint  Patient presents with   Acute Visit    Patient would like to discuss getting a transport chair. Patient having more trouble getting around. Patient fell this morning. Patient fell and hit left knee.     HPI: Patient is a 75 y.o. male for follow up at [facility].  He is getting ready to take a trip to [place] in September.  Wants something to push him.  Wants a light weight wheelchair.  Found one online that she wants. Very compact.  He already is in PT at this time. Plans to have him help them with transfers from wheelchair.   Had a fall this morning. No pain or injury.   Review of Systems:  Review of Systems  Constitutional:  Negative for chills, fever and weight loss.  Respiratory:  Negative for cough, sputum production and shortness of breath.   Cardiovascular:  Negative for chest pain, palpitations and leg swelling.  Gastrointestinal:  Negative for abdominal pain, constipation, diarrhea and heartburn.  Genitourinary:  Negative for dysuria, frequency and urgency.  Musculoskeletal:  Positive for falls. Negative for back pain, joint pain and myalgias.  Skin: Negative.   Neurological:  Positive for tingling, focal weakness and weakness. Negative for dizziness and headaches.   Past Medical History:  Diagnosis Date   Abnormal heart rhythm 10/20/2014   Addison's disease ([place])    Adrenal insufficiency (HCC)    Allergy    Ambulates with  cane    Anxiety    Arrhythmia    Cataract cortical, senile    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   Chronic kidney disease, stage 3 ([place]) 11/13/2011   Cough 03/29/2014   Edema leg 03/07/2017   Elevated cholesterol with high triglycerides 09/04/2013   Elevated red blood cell count    Erectile dysfunction    Falls frequently    Fever 03/29/2014   Generalized weakness 03/29/2014   GERD (gastroesophageal reflux disease)    Gout 09/04/2013   H/O adrenal insufficiency 05/17/2014   H/O atrial flutter 03/07/2017   H/O atrial flutter    H/O eye surgery    H/O sebaceous cyst 01/06/2018   H/O thyroid disease    H/O upper respiratory infection 10/15/2013   Hearing loss    History of back surgery    History of blepharoplasty    History of cancer 10/04/2015   History of chemotherapy    History of esophagogastroduodenoscopy (EGD)    History of prediabetes    Hyperlipidemia 06/16/2014   Hypertension    Hypertriglyceridemia    Hypothyroidism (acquired) 04/14/2014   Left leg swelling 11/23/2016   Low HDL (under 40)    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[place]) 11/10/2010   Malignant neoplasm of unspecified kidney, except renal pelvis (HCC)    OSA (obstructive sleep apnea) 07/24/2016   OSA (obstructive sleep apnea)    PAF (paroxysmal atrial fibrillation) ([place])  PAF (paroxysmal atrial fibrillation) ([place])    Pancreatic mass 08/01/2018   Renal carcinoma ([place]) 06/16/2015   RLS (restless legs syndrome) 07/24/2016   Sleep apnea    Testicular hypofunction    Tubular adenoma 10/28/2017   Tussive syncopes 10/15/2013   Type 2 diabetes mellitus ([place])    Vitamin D deficiency disease    Past Surgical History:  Procedure Laterality Date   ADRENALECTOMY     CATARACT EXTRACTION  10/2014   CATARACT EXTRACTION EXTRACAPSULAR  11/05/2016   with Insertion Intraocular Prostheis.    COLONOSCOPY  05/06/2017   with removal lesions by snare.   COLONOSCOPY W/ BIOPSIES     05/06/2017   NEPHRECTOMY      POPLITEAL SYNOVIAL CYST EXCISION     SPINE SURGERY  1989   THYROIDECTOMY     THYROIDECTOMY, PARTIAL     TONSILLECTOMY     Social History:   reports that he quit smoking about 44 years ago. His smoking use included cigarettes. He has a 10.00 pack-year smoking history. He has never used smokeless tobacco. He reports that he does not currently use alcohol. He reports that he does not use drugs.  Family History  Problem Relation Age of Onset   Ovarian cancer Mother    Cancer Mother    Cancer Father    Heart disease Father    Coronary artery disease Father    Hypertension Father    Macular degeneration Father    Heart disease Brother    Diabetes Brother    Coronary artery disease Brother    Diabetes type II Brother    Coronary artery disease Paternal Grandfather     Medications: Patient's Medications  New Prescriptions   No medications on file  Previous Medications   ACCU-CHEK SOFTCLIX LANCETS LANCETS    Use to check blood sugar once daily. Dx: E11.49   BLOOD GLUCOSE METER KIT AND SUPPLIES    1 each by Other route as directed. Dispense based on patient and insurance preference. Use up to four times daily as directed. (FOR ICD-10 E10.9, E11.9).   CHOLECALCIFEROL 125 MCG (5000 UT) CAPSULE    Take 5,000 Units by mouth daily.   COLCHICINE 0.6 MG TABLET    Take 0.6 mg by mouth as needed. First sign of Gout Flare. Take 2 tablets ( 1.85m) by mouth at first sign of gout flare followed by 1 tablet ( 0.651m after 1 hour. ( max 1.25m7mithin 1 hours)   CYANOCOBALAMIN 1000 MCG TABLET    Take 1,000 mcg by mouth every other day.   FLUDROCORTISONE ACETATE (FLORINEF PO)    Take 0.1 mg by mouth every other day. On alternate days take 1/2 tablet.   FUROSEMIDE (LASIX) 20 MG TABLET    Take 20 mg by mouth as needed. For Edema.   GABAPENTIN (NEURONTIN) 600 MG TABLET    Take 900 mg by mouth daily. Takes 1 and 1/2 tablet by mouth nightly.   GLUCOSE BLOOD (ACCU-CHEK AVIVA PLUS) TEST STRIP    Use to test blood  sugar once daily. Dx: E11.49   HYDROCORTISONE (CORTEF) 10 MG TABLET    Take 10 mg by mouth daily. Takes 1 and 1/2 tablet every morning, 1/2 tablet every afternoon, and 1/2 tablet every evening. Take double dose as directed for stress, may take up to 85 tablets monthly.   LEVOTHYROXINE (SYNTHROID) 75 MCG TABLET    Take 75 mcg by mouth daily.   METFORMIN (GLUMETZA) 500 MG (MOD) 24 HR TABLET  Take 500 mg by mouth daily.   MULTIPLE VITAMINS-MINERALS (PRESERVISION AREDS 2 PO)    Take 1 capsule by mouth in the morning and at bedtime.   NORTRIPTYLINE (PAMELOR) 10 MG CAPSULE    4 nightly   PSYLLIUM HUSK PO    Take 3.4 g by mouth every evening.  Modified Medications   No medications on file  Discontinued Medications   No medications on file    Physical Exam:  Vitals:   05/16/21 1006  BP: 120/77  Pulse: 86  Temp: 98.4 F (36.9 C)  TempSrc: Oral  SpO2: 95%  Weight: 205 lb (93 kg)  Height: _0  (1.727 m)   Body mass index is 31.17 kg/m. Wt Readings from Last 3 Encounters:  05/16/21 205 lb (93 kg)  01/20/21 207 lb 3.2 oz (94 kg)  09/08/20 200 lb (90.7 kg)    Physical Exam Constitutional:      General: He is not in acute distress.    Appearance: He is well-developed. He is not diaphoretic.  HENT:     Head: Normocephalic and atraumatic.     Right Ear: External ear normal.     Left Ear: External ear normal.     Mouth/Throat:     Pharynx: No oropharyngeal exudate.  Eyes:     Conjunctiva/sclera: Conjunctivae normal.     Pupils: Pupils are equal, round, and reactive to light.  Cardiovascular:     Rate and Rhythm: Normal rate and regular rhythm.     Heart sounds: Normal heart sounds.  Pulmonary:     Effort: Pulmonary effort is normal.     Breath sounds: Normal breath sounds.  Abdominal:     General: Bowel sounds are normal.     Palpations: Abdomen is soft.  Musculoskeletal:        General: No tenderness.     Cervical back: Normal range of motion and neck supple.     Right  lower leg: No edema.     Left lower leg: No edema.  Skin:    General: Skin is warm and dry.  Neurological:     Mental Status: He is alert and oriented to person, place, and time.     Gait: Gait abnormal.   Labs reviewed: Basic Metabolic Panel: Recent Labs    01/20/21 1226  NA 137  K 4.7  CL 101  CO2 26  GLUCOSE 154*  BUN 20  CREATININE 1.07  CALCIUM 9.3   Liver Function Tests: No results for input(s): AST, ALT, ALKPHOS, BILITOT, PROT, ALBUMIN in the last 8760 hours. No results for input(s): LIPASE, AMYLASE in the last 8760 hours. No results for input(s): AMMONIA in the last 8760 hours. CBC: Recent Labs    01/20/21 1226  WBC 7.5  NEUTROABS 5,160  HGB 14.6  HCT 44.2  MCV 87.0  PLT 304   Lipid Panel: No results for input(s): CHOL, HDL, LDLCALC, TRIG, CHOLHDL, LDLDIRECT in the last 8760 hours. TSH: No results for input(s): TSH in the last 8760 hours. 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  - DME Wheelchair manual  2. Right leg weakness -with multiple falls. Would benefit from wheelchair. Continues with PT - DME Wheelchair manual  3. Gait instability -ongoing with falls. Continues with PT - DME Wheelchair manual  4. 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  - DME Wheelchair manual  5. Gout, unspecified cause, unspecified chronicity,  unspecified site -continue dietary modifications. - colchicine 0.6 MG tablet; Take 1 tablet (0.6 mg total) by mouth as needed. First sign of Gout Flare. Take 2 tablets ( 1.56m) by mouth at first sign of gout flare followed by 1 tablet ( 0.642m after 1 hour. ( max 1.68m80mithin 1 hours)  Dispense: 180 tablet; Refill: 1   Dniya Neuhaus K. EubPaincourtvilleGNP[facility] [phone]

## 2021-05-22 ENCOUNTER — Other Ambulatory Visit: Payer: Self-pay

## 2021-05-22 ENCOUNTER — Other Ambulatory Visit: Payer: Medicare Other | Admitting: Student

## 2021-05-22 DIAGNOSIS — Z515 Encounter for palliative care: Secondary | ICD-10-CM

## 2021-05-22 DIAGNOSIS — C649 Malignant neoplasm of unspecified kidney, except renal pelvis: Secondary | ICD-10-CM

## 2021-05-22 DIAGNOSIS — R2689 Other abnormalities of gait and mobility: Secondary | ICD-10-CM

## 2021-05-22 NOTE — Progress Notes (Signed)
[facility] Palliative [facility] Consult Note Telephone: [phone]  Fax: [phone]    Date of encounter: 05/22/21 1:09 PM PATIENT NAME: Tony Huffman [address] [id number]   [phone] (home)  DOB: 1945-10-02 MRN: [id number] PRIMARY CARE PROVIDER:    [name], NP,  [place] [address] [phone]  REFERRING PROVIDER:   [name], NP [place],   [address] [phone]  RESPONSIBLE PARTY:    Contact Information     Name Relation Home Work [place], [place]   [phone]        I met face to face with patient and family in the home. Palliative Care was asked to follow this patient by consultation request of  [name], NP to address advance care planning and complex medical decision making. This is a follow up visit.                                   ASSESSMENT AND PLAN / RECOMMENDATIONS:   Advance Care Planning/Goals of Care: Goals include to maximize quality of life and symptom management.  CODE STATUS: DNR  Symptom Management/Plan:  Metastatic renal carcinoma-wishes to continue on comfort path; patient previously on hospice but discharged due to stability. Education on palliative vs. Hospice.  Impaired gait, frequent falls-patient currently working with PT. Continue PT as directed. Use cane or rollator walker for ambulation. Encourage urinal at night to help prevent patient from getting up too fast, resulting in falls.   Follow up Palliative Care Visit: Palliative care will continue to follow for complex medical decision making, advance care planning, and clarification of goals. Return in 8 weeks or prn.  I spent 40 minutes providing this consultation. More than 50% of the time in this consultation was spent in counseling and care coordination.   PPS: 50%  HOSPICE ELIGIBILITY/DIAGNOSIS: TBD  Chief Complaint: Palliative Medicine  follow up visit.   HISTORY OF PRESENT ILLNESS:  Tony Huffman is a 75 y.o. year old male  with metastatic renal cell carcinoma, Addison's disease, polyneuropathy, T2DM, hyperlipidemia, hypertension, atrial fibrillation, vitamin D deficiency, OSA, RLS, chronic gouty arthritis.    Patient reports doing well. He is currently receiving PT through [facility] home health. He reports falling last night, stating he had gotten up too fast to go to bathroom. Denies dizziness at the time; dizziness episodes have improved. Reports occasional "minor pains." takes acetaminophen with relief. Reports good appetite. Sleeping well. Weight has been stable. He has ordered a transport w/c as he is anticipating a trip to [place] in September. Treated for cellulitis to RLE a month ago with doxycycline. Had been on hospice for 2 years; discharged due to stability. A 10-point review of systems is negative, except for the pertinent positives and negatives detailed in the HPI.    History obtained from review of EMR, discussion with primary team, and interview with family, facility staff/caregiver and/or Mr. Pfluger.  I reviewed available labs, medications, imaging, studies and related documents from the EMR.  Records reviewed and summarized above.    Physical Exam: Weight: 200 pounds Pulse 80, resp 16, 120/78, sats 97% on room air Constitutional: NAD General: frail appearing  EYES: anicteric sclera, lids intact, no discharge  ENMT: intact hearing, oral mucous membranes moist CV: S1S2, RRR, no LE edema Pulmonary: LCTA, no increased work of breathing, no cough  Abdomen: normo-active BS + 4 quadrants, soft and non tender GU: deferred MSK: moves all extremities, ambulatory with cane, abnormal gait Skin: warm and dry, no rashes or wounds on visible skin Neuro: generalized weakness, A & O x 3 Psych: non-anxious affect, pleasant Hem/lymph/immuno: no widespread bruising   Thank you for the opportunity to participate in the  care of Mr. Tanimoto.  The palliative care team will continue to follow. Please call our office at [phone] if we can be of additional assistance.   Ezekiel Slocumb, NP   COVID-19 PATIENT SCREENING TOOL Asked and negative response unless otherwise noted:   Have you had symptoms of covid, tested positive or been in contact with someone with symptoms/positive test in the past 5-10 days? No

## 2021-07-18 ENCOUNTER — Other Ambulatory Visit: Payer: Self-pay

## 2021-07-18 ENCOUNTER — Other Ambulatory Visit: Payer: Medicare Other | Admitting: Student

## 2021-07-18 DIAGNOSIS — C649 Malignant neoplasm of unspecified kidney, except renal pelvis: Secondary | ICD-10-CM

## 2021-07-18 DIAGNOSIS — R262 Difficulty in walking, not elsewhere classified: Secondary | ICD-10-CM

## 2021-07-18 DIAGNOSIS — Z515 Encounter for palliative care: Secondary | ICD-10-CM

## 2021-07-18 NOTE — Progress Notes (Signed)
[facility] [facility] Palliative Care Consult Note Telephone: [phone]  Fax: [phone]    Date of encounter: 07/18/21 9:38 AM PATIENT NAME: Tony Huffman [address] [id number]   [id number] (home)  DOB: 01-17-1946 MRN: [id number] PRIMARY CARE PROVIDER:    [name], NP,  [place] [address] [phone]  REFERRING PROVIDER:   [name], NP [place],   [id number] [phone]  RESPONSIBLE PARTY:    Contact Information     Name Relation Home Work [place], [place]   [phone]        I met face to face with patient and family in the home. Palliative Care was asked to follow this patient by consultation request of  [name], NP to address advance care planning and complex medical decision making. This is a follow up visit.                                   ASSESSMENT AND PLAN / RECOMMENDATIONS:   Advance Care Planning/Goals of Care: Goals include to maximize quality of life and symptom management.  directive document .  CODE STATUS: DNR  Symptom Management/Plan:  Movement disorder, abnormal gait, imbalance-patient seen by Neurology last week. He has new referrals for outpatient PT and referral to [facility] clinic. Recommend continue using rollator walker; encourage w/c when feeling weaker. Patient with recurrent falls. He feels if he transitions to w/c he will be losing independence. Discussed maintaining safety. Recommend using [facility] transportation to and from appointments.  Metastatic renal carcinoma-wishes to continue on comfort path; patient previously on hospice but discharged due to stability. Education on palliative vs. Hospice. Will refer back to hospice should patient decline.    Follow up Palliative Care Visit: Palliative care will continue to follow for complex medical decision making, advance care planning, and clarification of goals.  Return in 8 weeks or prn.  I spent 40 minutes providing this consultation. More than 50% of the time in this consultation was spent in counseling and care coordination.    PPS: 50%  HOSPICE ELIGIBILITY/DIAGNOSIS: TBD  Chief Complaint: Palliative Medicine follow visit.   HISTORY OF PRESENT ILLNESS:  Tony Huffman is a 75 y.o. year old male  with metastatic renal cell carcinoma, Addison's disease, polyneuropathy, T2DM, hyperlipidemia, hypertension, atrial fibrillation, vitamin D deficiency, OSA, RLS, chronic gouty arthritis.    Reports falling more, increased weakness to left side, dragging foot. Recent stay at [facility] while wife was out of town. Patient reports falling in past week while at [facility], states he lost his balance turning. Wife recently had a stroke. Patient denies pain, shortness of breath, constipation or nausea. Endorses good appetite, sleeping well. No recent ED visits or hospitalizations. Patient to follow up with outpatient PT at [facility] and referral to [facility] clinic secondary to movement disorder,abnormal gait, imbalance. Patient still drives some only on campus. He does have a daughter that lives in [place]. Patient states she has two children, works and is currently in school. A 10-point review of systems is negative, except for the pertinent positives and negatives detailed in the HPI.  History obtained from review of EMR, discussion with primary team, and interview with family, facility staff/caregiver and/or Mr. Navarrete.  I reviewed available labs, medications, imaging, studies and related documents from the EMR.  Records reviewed and summarized above.  Physical Exam:  Pulse 76, resp 16, b/p 122/76, sats 94% on room air Constitutional: NAD General: frail appearing,  EYES: anicteric sclera, lids intact, no discharge  ENMT: intact hearing, oral mucous membranes moist, dentition intact CV: S1S2, RRR, no LE edema Pulmonary: LCTA, no increased work of breathing, no  cough Abdomen: normo-active BS + 4 quadrants, soft and non tender GU: deferred MSK: moves all extremities, ambulatory with walker, dragging left foot Skin: warm and dry, no rashes or wounds on visible skin Neuro: generalized weakness, A & O x 3 Psych: non-anxious affect, pleasant Hem/lymph/immuno: no widespread bruising   Thank you for the opportunity to participate in the care of Tony Huffman.  The palliative care team will continue to follow. Please call our office at [phone] if we can be of additional assistance.   Ezekiel Slocumb, NP   COVID-19 PATIENT SCREENING TOOL Asked and negative response unless otherwise noted:   Have you had symptoms of covid, tested positive or been in contact with someone with symptoms/positive test in the past 5-10 days?  No

## 2021-07-21 ENCOUNTER — Other Ambulatory Visit: Payer: Self-pay

## 2021-07-21 ENCOUNTER — Ambulatory Visit: Payer: Medicare Other | Attending: Neurology

## 2021-07-21 VITALS — BP 117/78 | HR 83 | Resp 17 | Ht 68.0 in | Wt 201.0 lb

## 2021-07-21 DIAGNOSIS — R2681 Unsteadiness on feet: Secondary | ICD-10-CM | POA: Diagnosis present

## 2021-07-21 DIAGNOSIS — R278 Other lack of coordination: Secondary | ICD-10-CM | POA: Diagnosis present

## 2021-07-21 DIAGNOSIS — R262 Difficulty in walking, not elsewhere classified: Secondary | ICD-10-CM | POA: Diagnosis present

## 2021-07-21 DIAGNOSIS — M6281 Muscle weakness (generalized): Secondary | ICD-10-CM | POA: Insufficient documentation

## 2021-07-21 DIAGNOSIS — R269 Unspecified abnormalities of gait and mobility: Secondary | ICD-10-CM | POA: Insufficient documentation

## 2021-07-21 DIAGNOSIS — R296 Repeated falls: Secondary | ICD-10-CM | POA: Insufficient documentation

## 2021-07-21 NOTE — Therapy (Signed)
[place] MAIN [facility] SERVICES [address] [place], [place], [address] Phone: [phone]   Fax:  [phone]  Physical Therapy Evaluation  Patient Details  Name: Tony Huffman MRN: [id number] Date of Birth: 1946/03/21 Referring Provider (PT): Dr. Joselyn Arrow   Encounter Date: 07/21/2021   PT End of Session - 07/21/21 1210     Visit Number 1    Number of Visits 25    Date for PT Re-Evaluation 10/13/21    Authorization Type Medicare Part B    Authorization Time Period 07/21/2021- 10/13/2021    Progress Note Due on Visit 10    PT Start Time 1015    PT Stop Time 1120    PT Time Calculation (min) 65 min    Equipment Utilized During Treatment Gait belt;Other (comment)   Left AFO   Activity Tolerance Patient tolerated treatment well    Behavior During Therapy [facility] for tasks assessed/performed             Past Medical History:  Diagnosis Date   Abnormal heart rhythm 10/20/2014   Addison's disease ([place])    Adrenal insufficiency ([place])    Allergy    Ambulates with cane    Anxiety    Arrhythmia    Cataract cortical, senile    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   Chronic kidney disease, stage 3 ([place]) 11/13/2011   Cough 03/29/2014   Edema leg 03/07/2017   Elevated cholesterol with high triglycerides 09/04/2013   Elevated red blood cell count    Erectile dysfunction    Falls frequently    Fever 03/29/2014   Generalized weakness 03/29/2014   GERD (gastroesophageal reflux disease)    Gout 09/04/2013   H/O adrenal insufficiency 05/17/2014   H/O atrial flutter 03/07/2017   H/O atrial flutter    H/O eye surgery    H/O sebaceous cyst 01/06/2018   H/O thyroid disease    H/O upper respiratory infection 10/15/2013   Hearing loss    History of back surgery    History of blepharoplasty    History of cancer 10/04/2015   History of chemotherapy    History of esophagogastroduodenoscopy (EGD)    History of prediabetes    Hyperlipidemia  06/16/2014   Hypertension    Hypertriglyceridemia    Hypothyroidism (acquired) 04/14/2014   Left leg swelling 11/23/2016   Low HDL (under 40)    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[place]) 11/10/2010   Malignant neoplasm of unspecified kidney, except renal pelvis (HCC)    OSA (obstructive sleep apnea) 07/24/2016   OSA (obstructive sleep apnea)    PAF (paroxysmal atrial fibrillation) (HCC)    PAF (paroxysmal atrial fibrillation) ([place])    Pancreatic mass 08/01/2018   Renal carcinoma ([place]) 06/16/2015   RLS (restless legs syndrome) 07/24/2016   Sleep apnea    Testicular hypofunction    Tubular adenoma 10/28/2017   Tussive syncopes 10/15/2013   Type 2 diabetes mellitus ([place])    Vitamin D deficiency disease     Past Surgical History:  Procedure Laterality Date   ADRENALECTOMY     CATARACT EXTRACTION  10/2014   CATARACT EXTRACTION EXTRACAPSULAR  11/05/2016   with Insertion Intraocular Prostheis.    COLONOSCOPY  05/06/2017   with removal lesions by snare.   COLONOSCOPY W/ BIOPSIES     05/06/2017   NEPHRECTOMY     POPLITEAL SYNOVIAL CYST EXCISION     SPINE SURGERY  1989   THYROIDECTOMY     THYROIDECTOMY, PARTIAL  TONSILLECTOMY      Vitals:   07/21/21 1029  BP: 117/78  Pulse: 83  Resp: 17  SpO2: 97%  Weight: 201 lb (91.2 kg)  Height: 5\' 8"  (1.727 m)      Subjective Assessment - 07/21/21 1156     Subjective Patient reports he is here to work on walking and balance to improve his mobility at home and community and decrease risk of falling.    Patient is accompained by: Family member   [name]   Pertinent History Patient is a 75 year old male with referral from Neurology- Dr. Joselyn Arrow for unspecified abnormality of gait. He presents today with his wife and reports > 6 month history of progressive issues with poor balance and multiple falls. Patient has renal Cancer and currently has Pallative care- Was stabilized out of Hospice. Patient presents with the following past  medical history: A-Fib and flutter, Lung Cancer, Obstructive sleep apnea, Upper respiratory infection, Addison's disease, Hypothyroidism, Renal Cancer, DM, gout, arthritis, Chronic kidney disease and Restless leg syndrome. Patient lives with wife in independent living at [facility]. Prior level of function was independent with transfers and mobility using walker yet some assist with dressing.    Limitations Lifting;Standing;Walking;House hold activities    How long can you sit comfortably? No issues    How long can you stand comfortably? <5 min    How long can you walk comfortably? <5 min    Diagnostic tests EXAM:  MRI HEAD WITHOUT CONTRAST     TECHNIQUE:  Multiplanar, multiecho pulse sequences of the brain and surrounding  structures were obtained without intravenous contrast.     COMPARISON:  CT head without contrast 01/20/2021     FINDINGS:  Brain: Moderate generalized atrophy is present. 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 Ventricles are enlarged  bilaterally. There is mild ventricular asymmetry, the right is  larger.     No acute infarct, hemorrhage, or mass lesion is present. No other  significant extra-axial collection is present.     The internal auditory canals are within normal limits. The brainstem  and cerebellum are within normal limits.     Vascular: Flow is present in the major intracranial arteries.     Skull and upper cervical spine: The craniocervical junction is  normal. Upper cervical spine is within normal limits. Marrow signal  is unremarkable.     Sinuses/Orbits: The paranasal sinuses and mastoid air cells are  clear. Bilateral lens replacements are noted. Globes and orbits are  otherwise unremarkable.     IMPRESSION:  1. No acute intracranial abnormality.  2. Moderate generalized atrophy and white matter disease likely  reflects the sequela of chronic microvascular ischemia.  3. Arachnoid cyst over the left convexity.    Patient Stated Goals I would like to walk  better and not fall    Currently in Pain? Yes    Pain Score 2     Pain Location Knee    Pain Orientation Right;Anterior    Pain Descriptors / Indicators Aching    Pain Type Chronic pain    Pain Onset More than a month ago    Pain Frequency Intermittent    Aggravating Factors  transfer; prolonged standing or walking    Pain Relieving Factors rest    Effect of Pain on Daily Activities Difficulty walking/standing ADL's    Multiple Pain Sites Yes    Pain Score 2    Pain Location Foot  Pain Orientation Left    Pain Descriptors / Indicators Aching    Pain Type Chronic pain    Pain Onset More than a month ago    Pain Frequency Intermittent    Aggravating Factors  standing up - worse in am    Pain Relieving Factors rest, mobility throughout the day    Effect of Pain on Daily Activities Patient reports still able to perform ADL's but limited by pain                [facility] PT Assessment - 07/21/21 1035       Assessment   Medical Diagnosis Unspecified abnormality of gait    Referring Provider (PT) Dr. Joselyn Arrow    Onset Date/Surgical Date 10/01/20   Not specific- patient reports has been progressively worse for several months.   Hand Dominance Right    Next MD Visit 01/21/2022    Prior Therapy Outpatient PT - many years ago      Precautions   Precautions Fall    Required Braces or Orthoses Other Brace/Splint   Left front loading AFO     Restrictions   Weight Bearing Restrictions No    Other Position/Activity Restrictions no      Balance Screen   Has the patient fallen in the past 6 months Yes    How many times? 10    Has the patient had a decrease in activity level because of a fear of falling?  Yes    Is the patient reluctant to leave their home because of a fear of falling?  Yes      [place] Private residence    Living Arrangements Spouse/significant other    Available Help at Discharge Family      Prior Function   Level of Independence  Independent with basic ADLs;Independent with household mobility with device;Needs assistance with ADLs      Cognition   Overall Cognitive Status Within Functional Limits for tasks assessed    Attention Focused    Focused Attention Appears intact    Memory Appears intact    Awareness Appears intact    Problem Solving Appears intact    Executive Function Reasoning;Sequencing;Organizing;Decision Making;Initiating;Self Monitoring;Self Correcting    Reasoning Appears intact    Sequencing Appears intact    Organizing Appears intact    Decision Making Appears intact    Initiating Appears intact    Self Monitoring Appears intact    Self Correcting Appears intact      Observation/Other Assessments   Skin Integrity No skin integrity issues      Sensation   Light Touch Impaired by gross assessment   Decreased light touch with B lower leg     Balance   Balance Assessed Yes      Standardized Balance Assessment   Standardized Balance Assessment Berg Balance Test      Berg Balance Test   Sit to Stand Able to stand without using hands and stabilize independently    Standing Unsupported Able to stand 2 minutes with supervision    Sitting with Back Unsupported but Feet Supported on Floor or Stool Able to sit safely and securely 2 minutes    Stand to Sit Controls descent by using hands    Transfers Able to transfer safely, definite need of hands    Standing Unsupported with Eyes Closed Able to stand 10 seconds with supervision    Standing Unsupported with Feet Together Able to place feet together independently  and stand 1 minute safely    From Standing, Reach Forward with Outstretched Arm Can reach forward >12 cm safely (5")    From Standing Position, Pick up Object from [place] to pick up shoe, needs supervision    From Standing Position, Turn to Look Behind Over each Shoulder Turn sideways only but maintains balance    Turn 360 Degrees Able to turn 360 degrees safely but slowly     Standing Unsupported, Alternately Place Feet on Step/Stool Able to complete 4 steps without aid or supervision    Standing Unsupported, One Foot in [place] to take small step independently and hold 30 seconds    Standing on One Leg Tries to lift leg/unable to hold 3 seconds but remains standing independently    Total Score 39                 OBJECTIVE  MUSCULOSKELETAL: Tremor: Absent Bulk: Normal Tone: Normal, no clonus  Posture Forward head and rounded shoulders.  Gait Left foot drop/drag with ambulation today using 4WW. Patient ambulates with step to gait sequencing. Limited to 150 feet in clinic today.   Strength R/L 5/4 Hip flexion 5/4 Hip external rotation 5/4 Hip internal rotation 5/4 Hip extension  5/4 Hip abduction 5/4 Hip adduction 5/4 Knee extension 5/4 Knee flexion 5/4 Ankle Plantarflexion 5/4 Ankle Dorsiflexion   NEUROLOGICAL:  Mental Status Patient is oriented to person, place and time.  Recent memory is intact.  Remote memory is intact.  Attention span and concentration are intact.  Expressive speech is intact.  Patient's fund of knowledge is within normal limits for educational level.  Cranial Nerves Visual acuity and visual fields are intact except mild decreased peripheral on left Facial sensation is intact bilaterally  Hearing is impaired as tested by gross conversation     Coordination/Cerebellar Finger to Nose: WNL   FUNCTIONAL OUTCOME MEASURES   Results Comments  BERG 39/56 Fall risk, in need of intervention          TUG  23 seconds 4WW- fall risk  5TSTS  21 seconds Without UE      10 Meter Gait Speed Self-selected: 23.37s =  0.16m/s Below normative values for full community ambulation             ASSESSMENT Clinical Impression: Pt is a pleasant 75 year-old male referred for abnormality of gait. PT examination reveals deficits . Pt presents with deficits in Left LE strength and functional foot drop, Impaired  balance and  gait as seen by functional outcome measures. Pt will benefit from skilled PT services to address deficits in mobility, strength, and balance to  decrease risk for future falls and improve quality of life.                         Objective measurements completed on examination: See above findings.                PT Education - 07/21/21 1209     Education Details PT Plan of care    Person(s) Educated Patient    Methods Explanation;Verbal cues    Comprehension Verbalized understanding;Need further instruction              PT Short Term Goals - 07/22/21 0949       PT SHORT TERM GOAL #1   Title Pt will be independent with HEP in order to improve strength and balance in order to decrease fall risk and  improve function at home and work.    Baseline 07/21/2021- Paitent presents with no formal HEP in place    Time 6    Period Weeks    Status New    Target Date 09/01/21               PT Long Term Goals - 07/22/21 0950       PT LONG TERM GOAL #1   Title Pt will improve FOTO to target score of 53  to display perceived improvements in ability to complete ADL's.    Baseline 07/21/2021= 45    Time 12    Period Weeks    Status New    Target Date 10/13/21      PT LONG TERM GOAL #2   Title Pt will improve BERG by at least 5 points in order to demonstrate clinically significant improvement in balance.    Baseline 07/21/2021= 39/56    Time 12    Period Weeks    Status New    Target Date 10/13/21      PT LONG TERM GOAL #3   Title Pt will decrease 5TSTS by at least 5 seconds in order to demonstrate clinically significant improvement in LE strength.    Baseline 07/21/2021= 21 sec without UE Support    Time 12    Period Weeks    Status New    Target Date 10/13/21      PT LONG TERM GOAL #4   Title Pt will decrease TUG to below  18 seconds/decrease in order to demonstrate decreased fall risk.    Baseline 07/21/2021= 23 sec with 4WW     Time 12    Period Weeks    Status New    Target Date 10/13/21      PT LONG TERM GOAL #5   Title Pt will increase 10MWT by at least 0.23 m/s in order to demonstrate clinically significant improvement in community ambulation.    Baseline 07/21/2021= 0.47m/s    Time 12    Period Weeks    Status New    Target Date 10/13/21                    Plan - 07/21/21 1211     Clinical Impression Statement Pt is a pleasant 75 year-old male referred for abnormality of gait. PT examination reveals deficits . Pt presents with deficits in Left LE strength and functional foot drop, Impaired balance and  gait as seen by functional outcome measures. Pt will benefit from skilled PT services to address deficits in mobility, strength, and balance to  decrease risk for future falls and improve quality of life.    Personal Factors and Comorbidities Comorbidity 3+    Comorbidities A- Fib, Renal cancer, Addison's disease, gout, arthritis, DM    Examination-Activity Limitations Caring for Others;Carry;Dressing;Lift;Reach Overhead;Squat;Stairs;Transfers;Stand    Examination-Participation Restrictions Investment banker, operational;Yard Work;Shop    Stability/Clinical Decision Making Evolving/Moderate complexity    Clinical Decision Making Moderate    Rehab Potential Good    PT Frequency 2x / week    PT Duration 12 weeks    PT Treatment/Interventions ADLs/Self Care Home Management;Canalith Repostioning;Cryotherapy;Electrical Stimulation;Moist Heat;Traction;Ultrasound;DME Instruction;Gait training;Stair training;Functional mobility training;Therapeutic activities;Therapeutic exercise;Balance training;Neuromuscular re-education;Patient/family education;Orthotic Fit/Training;Wheelchair mobility training;Manual techniques;Passive range of motion;Dry needling;Energy conservation;Taping;Vestibular    PT Next Visit Plan Assess gait with use of AFO; initiate gait/transfer/balance training    PT Home Exercise  Plan To be initiated next 1-2 visits    Recommended Other Services  None at this time    Consulted and Agree with Plan of Care Patient;Family member/caregiver    Family Member Consulted WIfe- [name]             Patient will benefit from skilled therapeutic intervention in order to improve the following deficits and impairments:  Abnormal gait, Decreased activity tolerance, Decreased balance, Decreased coordination, Decreased endurance, Decreased knowledge of use of DME, Decreased mobility, Decreased strength, Difficulty walking, Hypomobility, Impaired sensation, Impaired vision/preception, Postural dysfunction, Pain  Visit Diagnosis: Abnormality of gait and mobility  Difficulty in walking, not elsewhere classified  Muscle weakness (generalized)  Unsteadiness on feet  Other lack of coordination  Repeated falls     Problem List Patient Active Problem List   Diagnosis Date Noted   Addisons disease ([place]) 03/16/2021   Low HDL (under 40) 03/16/2021   Difficulty sleeping 11/21/2020   Numbness and tingling 11/21/2020   Difficulty walking 01/14/2020   History of recent fall 01/14/2020   Sleep disorder 01/14/2020   Pancreatic mass 08/01/2018   H/O sebaceous cyst 01/06/2018   Tubular adenoma 10/28/2017   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   Edema leg 03/07/2017   Hx of atrial flutter 03/07/2017   OSA (obstructive sleep apnea) 07/24/2016   RLS (restless legs syndrome) 07/24/2016   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   Atrial fibrillation and flutter ([place]) 10/20/2014   Hyperlipidemia 06/16/2014   Adrenal crisis ([place]) 04/19/2014   Cough 03/29/2014   Weakness 03/29/2014   Cough syncope 10/15/2013   URI (upper respiratory infection) 10/15/2013   Dyslipidemia 09/04/2013   Gout 09/04/2013   Chronic kidney disease, stage III (moderate) ([place]) 11/13/2011   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   Hypothyroidism (acquired) 11/30/2010   Malignant neoplasm of  adrenal gland ([place]) 11/10/2010    Lewis Moccasin, PT 07/22/2021, 10:02 AM  [place] MAIN [facility] SERVICES [address] [place], [place], [address] Phone: [phone]   Fax:  [phone]  Name: Kanen Mottola MRN: [id number] Date of Birth: 06/03/1946

## 2021-07-24 ENCOUNTER — Ambulatory Visit: Payer: Medicare Other | Admitting: Physical Therapy

## 2021-07-24 ENCOUNTER — Other Ambulatory Visit: Payer: Self-pay

## 2021-07-24 DIAGNOSIS — R262 Difficulty in walking, not elsewhere classified: Secondary | ICD-10-CM

## 2021-07-24 DIAGNOSIS — R2681 Unsteadiness on feet: Secondary | ICD-10-CM

## 2021-07-24 DIAGNOSIS — R269 Unspecified abnormalities of gait and mobility: Secondary | ICD-10-CM

## 2021-07-24 DIAGNOSIS — R296 Repeated falls: Secondary | ICD-10-CM

## 2021-07-24 DIAGNOSIS — R278 Other lack of coordination: Secondary | ICD-10-CM

## 2021-07-24 DIAGNOSIS — M6281 Muscle weakness (generalized): Secondary | ICD-10-CM

## 2021-07-24 NOTE — Therapy (Signed)
[place] MAIN [facility] SERVICES [address] [place], [place], [address] Phone: [phone]   Fax:  [phone]  Physical Therapy Treatment  Patient Details  Name: Tony Huffman MRN: [id number] Date of Birth: [birth year] Referring Provider (PT): Dr. Joselyn Arrow   Encounter Date: 07/24/2021   PT End of Session - 07/24/21 0817     Visit Number 2    Number of Visits 25    Date for PT Re-Evaluation 10/13/21    Authorization Type Medicare Part B    Authorization Time Period 07/21/2021- 10/13/2021    Progress Note Due on Visit 10    PT Start Time 0807    PT Stop Time 0845    PT Time Calculation (min) 38 min    Equipment Utilized During Treatment Gait belt;Other (comment)   Left AFO   Activity Tolerance Patient tolerated treatment well    Behavior During Therapy [facility] for tasks assessed/performed             Past Medical History:  Diagnosis Date   Abnormal heart rhythm 10/20/2014   Addison's disease ([place])    Adrenal insufficiency ([place])    Allergy    Ambulates with cane    Anxiety    Arrhythmia    Cataract cortical, senile    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   Chronic kidney disease, stage 3 ([place]) 11/13/2011   Cough 03/29/2014   Edema leg 03/07/2017   Elevated cholesterol with high triglycerides 09/04/2013   Elevated red blood cell count    Erectile dysfunction    Falls frequently    Fever 03/29/2014   Generalized weakness 03/29/2014   GERD (gastroesophageal reflux disease)    Gout 09/04/2013   H/O adrenal insufficiency 05/17/2014   H/O atrial flutter 03/07/2017   H/O atrial flutter    H/O eye surgery    H/O sebaceous cyst 01/06/2018   H/O thyroid disease    H/O upper respiratory infection 10/15/2013   Hearing loss    History of back surgery    History of blepharoplasty    History of cancer 10/04/2015   History of chemotherapy    History of esophagogastroduodenoscopy (EGD)    History of prediabetes    Hyperlipidemia  06/16/2014   Hypertension    Hypertriglyceridemia    Hypothyroidism (acquired) 04/14/2014   Left leg swelling 11/23/2016   Low HDL (under 40)    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[place]) 11/10/2010   Malignant neoplasm of unspecified kidney, except renal pelvis (HCC)    OSA (obstructive sleep apnea) 07/24/2016   OSA (obstructive sleep apnea)    PAF (paroxysmal atrial fibrillation) (HCC)    PAF (paroxysmal atrial fibrillation) ([place])    Pancreatic mass 08/01/2018   Renal carcinoma ([place]) 06/16/2015   RLS (restless legs syndrome) 07/24/2016   Sleep apnea    Testicular hypofunction    Tubular adenoma 10/28/2017   Tussive syncopes 10/15/2013   Type 2 diabetes mellitus ([place])    Vitamin D deficiency disease     Past Surgical History:  Procedure Laterality Date   ADRENALECTOMY     CATARACT EXTRACTION  10/2014   CATARACT EXTRACTION EXTRACAPSULAR  11/05/2016   with Insertion Intraocular Prostheis.    COLONOSCOPY  05/06/2017   with removal lesions by snare.   COLONOSCOPY W/ BIOPSIES     05/06/2017   NEPHRECTOMY     POPLITEAL SYNOVIAL CYST EXCISION     SPINE SURGERY  1989   THYROIDECTOMY     THYROIDECTOMY, PARTIAL  TONSILLECTOMY      There were no vitals filed for this visit.   Subjective Assessment - 07/24/21 0810     Subjective Pt reports he is having some gout pain (2-3/10) in he left foot, specifically his big toe.  Pt notes that this has been an ongoing issue over a while.    Patient is accompained by: Family member   wife - Tony Huffman   Pertinent History Patient is a 75 year old male with referral from Neurology- Dr. Joselyn Arrow for unspecified abnormality of gait. He presents today with his wife and reports > 6 month history of progressive issues with poor balance and multiple falls. Patient has renal Cancer and currently has Pallative care- Was stabilized out of Hospice. Patient presents with the following past medical history: A-Fib and flutter, Lung Cancer, Obstructive sleep  apnea, Upper respiratory infection, Addison's disease, Hypothyroidism, Renal Cancer, DM, gout, arthritis, Chronic kidney disease and Restless leg syndrome. Patient lives with wife in independent living at [facility]. Prior level of function was independent with transfers and mobility using walker yet some assist with dressing.    Limitations Lifting;Standing;Walking;House hold activities    How long can you sit comfortably? No issues    How long can you stand comfortably? <5 min    How long can you walk comfortably? <5 min    Diagnostic tests EXAM:  MRI HEAD WITHOUT CONTRAST     TECHNIQUE:  Multiplanar, multiecho pulse sequences of the brain and surrounding  structures were obtained without intravenous contrast.     COMPARISON:  CT head without contrast 01/20/2021     FINDINGS:  Brain: Moderate generalized atrophy is present. 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 Ventricles are enlarged  bilaterally. There is mild ventricular asymmetry, the right is  larger.     No acute infarct, hemorrhage, or mass lesion is present. No other  significant extra-axial collection is present.     The internal auditory canals are within normal limits. The brainstem  and cerebellum are within normal limits.     Vascular: Flow is present in the major intracranial arteries.     Skull and upper cervical spine: The craniocervical junction is  normal. Upper cervical spine is within normal limits. Marrow signal  is unremarkable.     Sinuses/Orbits: The paranasal sinuses and mastoid air cells are  clear. Bilateral lens replacements are noted. Globes and orbits are  otherwise unremarkable.     IMPRESSION:  1. No acute intracranial abnormality.  2. Moderate generalized atrophy and white matter disease likely  reflects the sequela of chronic microvascular ischemia.  3. Arachnoid cyst over the left convexity.    Patient Stated Goals I would like to walk better and not fall    Currently in Pain? Yes    Pain Score 3      Pain Location Foot    Pain Orientation Left    Pain Descriptors / Indicators Aching    Pain Type Acute pain    Pain Onset More than a month ago    Pain Onset More than a month ago                     PT Education - 07/24/21 1230     Education Details HEP given as part of education for performance of exercises at home.    Person(s) Educated Patient;Spouse    Methods Explanation;Verbal cues;Demonstration    Comprehension Verbalized understanding;Need further instruction  PT Short Term Goals - 07/22/21 0949       PT SHORT TERM GOAL #1   Title Pt will be independent with HEP in order to improve strength and balance in order to decrease fall risk and improve function at home and work.    Baseline 07/21/2021- Paitent presents with no formal HEP in place    Time 6    Period Weeks    Status New    Target Date 09/01/21               PT Long Term Goals - 07/22/21 0950       PT LONG TERM GOAL #1   Title Pt will improve FOTO to target score of 53  to display perceived improvements in ability to complete ADL's.    Baseline 07/21/2021= 45    Time 12    Period Weeks    Status New    Target Date 10/13/21      PT LONG TERM GOAL #2   Title Pt will improve BERG by at least 5 points in order to demonstrate clinically significant improvement in balance.    Baseline 07/21/2021= 39/56    Time 12    Period Weeks    Status New    Target Date 10/13/21      PT LONG TERM GOAL #3   Title Pt will decrease 5TSTS by at least 5 seconds in order to demonstrate clinically significant improvement in LE strength.    Baseline 07/21/2021= 21 sec without UE Support    Time 12    Period Weeks    Status New    Target Date 10/13/21      PT LONG TERM GOAL #4   Title Pt will decrease TUG to below  18 seconds/decrease in order to demonstrate decreased fall risk.    Baseline 07/21/2021= 23 sec with 4WW    Time 12    Period Weeks    Status New    Target Date 10/13/21       PT LONG TERM GOAL #5   Title Pt will increase 10MWT by at least 0.23 m/s in order to demonstrate clinically significant improvement in community ambulation.    Baseline 07/21/2021= 0.37m/s    Time 12    Period Weeks    Status New    Target Date 10/13/21                  Treatment:    Seated:   RTB hamstring curl 2x15 each LE RTB hip abduction with therapist applied resistance, 2x15 each LE LAQs with 2.5# ankle weights, 1x10 each LE, 1x10  each LE with PT assist for slow eccentric phase STS x10, with some difficulty towards the end, pt noting some residual knee issues  Ambulate with 4WW, cueing for upright posture; 2x86 ft:  Total due to challenge , multimodal cueing; fatigue, and positional requirement.  Pt has significant weakness after performing bouts of walking.    Therapist generated HEP for pt to perform in between bouts of therapy sessions.  Pt and wife were emailed copy of exercises.  Seated rest breaks utilized throughout to prevent pt from fatiguing too quickly and being unable to perform transfer to car.    Pt educated throughout session about proper posture and technique with exercises. Improved exercise technique, movement at target joints, use of target muscles after min to mod verbal, visual, tactile cues      Plan - 07/24/21 1152     Clinical  Impression Statement Pt tolerated treatment well, specifically noting fatigue with ambulation.  Pt encouraged and given verbal cuing in order to increase hip flexion for L foot clearance.  Pt struggled with this initially, but was able to perform consistently until he became fatigued.  Pt also notes he has not received an HEP, so an emailed copy was sent to him and his wife for exercises to perform at home.  Pt will continue to benefit from skilled therapy to address remaining concerns into    Personal Factors and Comorbidities Comorbidity 3+    Comorbidities A- Fib, Renal cancer, Addison's disease, gout,  arthritis, DM    Examination-Activity Limitations Caring for Others;Carry;Dressing;Lift;Reach Overhead;Squat;Stairs;Transfers;Stand    Examination-Participation Restrictions Community Activity;Cleaning;Driving;Yard Work;Shop    Stability/Clinical Decision Making Evolving/Moderate complexity    Rehab Potential Good    PT Frequency 2x / week    PT Duration 12 weeks    PT Treatment/Interventions ADLs/Self Care Home Management;Canalith Repostioning;Cryotherapy;Electrical Stimulation;Moist Heat;Traction;Ultrasound;DME Instruction;Gait training;Stair training;Functional mobility training;Therapeutic activities;Therapeutic exercise;Balance training;Neuromuscular re-education;Patient/family education;Orthotic Fit/Training;Wheelchair mobility training;Manual techniques;Passive range of motion;Dry needling;Energy conservation;Taping;Vestibular    PT Next Visit Plan Assess gait with use of AFO; initiate gait/transfer/balance training    PT Home Exercise Plan To be initiated next 1-2 visits    Consulted and Agree with Plan of Care Patient;Family member/caregiver    Family Member Consulted WIfe- Tony Huffman             Patient will benefit from skilled therapeutic intervention in order to improve the following deficits and impairments:  Abnormal gait, Decreased activity tolerance, Decreased balance, Decreased coordination, Decreased endurance, Decreased knowledge of use of DME, Decreased mobility, Decreased strength, Difficulty walking, Hypomobility, Impaired sensation, Impaired vision/preception, Postural dysfunction, Pain  Visit Diagnosis: Abnormality of gait and mobility  Difficulty in walking, not elsewhere classified  Muscle weakness (generalized)  Unsteadiness on feet  Other lack of coordination  Repeated falls     Problem List Patient Active Problem List   Diagnosis Date Noted   Addisons disease ([place]) 03/16/2021   Low HDL (under 40) 03/16/2021   Difficulty sleeping 11/21/2020    Numbness and tingling 11/21/2020   Difficulty walking 01/14/2020   History of recent fall 01/14/2020   Sleep disorder 01/14/2020   Pancreatic mass 08/01/2018   H/O sebaceous cyst 01/06/2018   Tubular adenoma 10/28/2017   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   Edema leg 03/07/2017   Hx of atrial flutter 03/07/2017   OSA (obstructive sleep apnea) 07/24/2016   RLS (restless legs syndrome) 07/24/2016   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   Atrial fibrillation and flutter ([place]) 10/20/2014   Hyperlipidemia 06/16/2014   Adrenal crisis ([place]) 04/19/2014   Cough 03/29/2014   Weakness 03/29/2014   Cough syncope 10/15/2013   URI (upper respiratory infection) 10/15/2013   Dyslipidemia 09/04/2013   Gout 09/04/2013   Chronic kidney disease, stage III (moderate) ([place]) 11/13/2011   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   Hypothyroidism (acquired) 11/30/2010   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[place]) 11/10/2010     Gwenlyn Saran, PT, DPT 07/24/21, 12:33 PM   Christie Nottingham, PT 07/24/2021, 12:33 PM  [place] MAIN [facility] SERVICES [address] [place], [place], [address] Phone: [phone]   Fax:  [phone]  Name: Tony Huffman MRN: [id number] Date of Birth: 01-Aug-1946

## 2021-07-27 ENCOUNTER — Other Ambulatory Visit: Payer: Self-pay

## 2021-07-27 ENCOUNTER — Encounter: Payer: Self-pay | Admitting: Nurse Practitioner

## 2021-07-27 ENCOUNTER — Ambulatory Visit: Payer: Medicare Other | Admitting: Nurse Practitioner

## 2021-07-27 VITALS — BP 118/72 | HR 84 | Temp 98.4°F | Ht 68.0 in

## 2021-07-27 DIAGNOSIS — M109 Gout, unspecified: Secondary | ICD-10-CM | POA: Diagnosis not present

## 2021-07-27 DIAGNOSIS — L918 Other hypertrophic disorders of the skin: Secondary | ICD-10-CM | POA: Diagnosis not present

## 2021-07-27 MED ORDER — PREDNISONE 10 MG (21) PO TBPK: ORAL_TABLET | ORAL | 0 refills | Status: DC

## 2021-07-27 NOTE — Progress Notes (Signed)
Careteam: Patient Care Team: [name], NP as PCP - General (Geriatric Medicine) [name], MD as Physician Assistant (Endocrinology) [name], MD as Referring Physician (Neurology)  Advanced Directive information    Allergies  Allergen Reactions   Other Rash    Blisters with bandaids Blisters with bandaids    Soap Itching    Any Deodorant Soap.   Tegaderm Ag Mesh [Silver]    Tape Rash    Blisters, rash. Paper tape OK. Blisters, rash. Paper tape OK.     Chief Complaint  Patient presents with   Acute Visit    Patient has mole on left side of neck he would like looked at. Patient states it is irritated and doesn't look good. Mole has been irritated for about a week. Scraped it on his rolling walker and hasn't seem to get better. Had used neosporin on it. He has not noticed any bleeding from area.     HPI: Patient is a 75 y.o. male seen in today at the [facility] for irritated mole on chest. Pt reports ~1 week ago he feel and walker hit his chest were he had a mole. Since then has been irritated, red. No pain or drainage.  No warmth.    Also with gout flare- has done 2 rounds of colchine and still having pain in great toe. Not has bad has it has been in the past but still significant   Review of Systems:  Review of Systems  Constitutional:  Negative for chills and fever.  Musculoskeletal:  Positive for joint pain and myalgias.  Skin:  Negative for itching and rash.       Irritated mole to top left chest area   Past Medical History:  Diagnosis Date   Abnormal heart rhythm 10/20/2014   Addison's disease ([place])    Adrenal insufficiency (H[facility])    Allergy    Ambulates with cane    Anxiety    Arrhythmia    Cataract cortical, senile    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   Chronic kidney disease, stage 3 ([place]) 11/13/2011   Cough 03/29/2014   Edema leg 03/07/2017   Elevated cholesterol with high triglycerides 09/04/2013   Elevated red  blood cell count    Erectile dysfunction    Falls frequently    Fever 03/29/2014   Generalized weakness 03/29/2014   GERD (gastroesophageal reflux disease)    Gout 09/04/2013   H/O adrenal insufficiency 05/17/2014   H/O atrial flutter 03/07/2017   H/O atrial flutter    H/O eye surgery    H/O sebaceous cyst 01/06/2018   H/O thyroid disease    H/O upper respiratory infection 10/15/2013   Hearing loss    History of back surgery    History of blepharoplasty    History of cancer 10/04/2015   History of chemotherapy    History of esophagogastroduodenoscopy (EGD)    History of prediabetes    Hyperlipidemia 06/16/2014   Hypertension    Hypertriglyceridemia    Hypothyroidism (acquired) 04/14/2014   Left leg swelling 11/23/2016   Low HDL (under 40)    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[place]) 11/10/2010   Malignant neoplasm of unspecified kidney, except renal pelvis (HCC)    OSA (obstructive sleep apnea) 07/24/2016   OSA (obstructive sleep apnea)    PAF (paroxysmal atrial fibrillation) (H[facility])    PAF (paroxysmal atrial fibrillation) ([place])    Pancreatic mass 08/01/2018   Renal carcinoma ([place]) 06/16/2015   RLS (restless  legs syndrome) 07/24/2016   Sleep apnea    Testicular hypofunction    Tubular adenoma 10/28/2017   Tussive syncopes 10/15/2013   Type 2 diabetes mellitus ([place])    Vitamin D deficiency disease    Past Surgical History:  Procedure Laterality Date   ADRENALECTOMY     CATARACT EXTRACTION  10/2014   CATARACT EXTRACTION EXTRACAPSULAR  11/05/2016   with Insertion Intraocular Prostheis.    COLONOSCOPY  05/06/2017   with removal lesions by snare.   COLONOSCOPY W/ BIOPSIES     05/06/2017   NEPHRECTOMY     POPLITEAL SYNOVIAL CYST EXCISION     SPINE SURGERY  1989   THYROIDECTOMY     THYROIDECTOMY, PARTIAL     TONSILLECTOMY     Social History:   reports that he quit smoking about 44 years ago. His smoking use included cigarettes. He has a 10.00 pack-year smoking  history. He has never used smokeless tobacco. He reports that he does not currently use alcohol. He reports that he does not use drugs.  Family History  Problem Relation Age of Onset   Ovarian cancer Mother    Cancer Mother    Cancer Father    Heart disease Father    Coronary artery disease Father    Hypertension Father    Macular degeneration Father    Heart disease Brother    Diabetes Brother    Coronary artery disease Brother    Diabetes type II Brother    Coronary artery disease Paternal Grandfather     Medications: Patient's Medications  New Prescriptions   No medications on file  Previous Medications   ACCU-CHEK SOFTCLIX LANCETS LANCETS    Use to check blood sugar once daily. Dx: E11.49   BLOOD GLUCOSE METER KIT AND SUPPLIES    1 each by Other route as directed. Dispense based on patient and insurance preference. Use up to four times daily as directed. (FOR ICD-10 E10.9, E11.9).   CHOLECALCIFEROL 125 MCG (5000 UT) CAPSULE    Take 5,000 Units by mouth daily.   COLCHICINE 0.6 MG TABLET    Take 1 tablet (0.6 mg total) by mouth as needed. First sign of Gout Flare. Take 2 tablets ( 1.$RemoveBef'2mg'DyegeaWSCu$ ) by mouth at first sign of gout flare followed by 1 tablet ( 0.$RemoveBe'6mg'DHGfJiIhY$ ) after 1 hour. ( max 1.$Remov'8mg'WWWxJB$  within 1 hours)   CYANOCOBALAMIN 1000 MCG TABLET    Take 1,000 mcg by mouth every other day.   FLUDROCORTISONE ACETATE (FLORINEF PO)    Take 0.1 mg by mouth every other day. On alternate days take 1/2 tablet.   FUROSEMIDE (LASIX) 20 MG TABLET    Take 20 mg by mouth as needed. For Edema.   GABAPENTIN (NEURONTIN) 600 MG TABLET    Take 900 mg by mouth daily. Takes 1 and 1/2 tablet by mouth nightly.   GLUCOSE BLOOD (ACCU-CHEK AVIVA PLUS) TEST STRIP    Use to test blood sugar once daily. Dx: E11.49   HYDROCORTISONE (CORTEF) 10 MG TABLET    Take 10 mg by mouth daily. Takes 1 and 1/2 tablet every morning, 1/2 tablet every afternoon, and 1/2 tablet every evening. Take double dose as directed for stress, may take up  to 85 tablets monthly.   LEVOTHYROXINE (SYNTHROID) 75 MCG TABLET    Take 75 mcg by mouth daily.   METFORMIN (GLUMETZA) 500 MG (MOD) 24 HR TABLET    Take 500 mg by mouth daily.   MULTIPLE VITAMINS-MINERALS (PRESERVISION AREDS 2 PO)  Take 1 capsule by mouth in the morning and at bedtime.   NORTRIPTYLINE (PAMELOR) 10 MG CAPSULE    2 nightly   PSYLLIUM HUSK PO    Take 3.4 g by mouth every evening.  Modified Medications   No medications on file  Discontinued Medications   No medications on file    Physical Exam:  Vitals:   07/27/21 1405  BP: 118/72  Pulse: 84  Temp: 98.4 F (36.9 C)  SpO2: 97%  Height: $Remove'5\' 8"'YsqlnPU$  (1.727 m)   Body mass index is 30.56 kg/m. Wt Readings from Last 3 Encounters:  07/21/21 201 lb (91.2 kg)  05/16/21 205 lb (93 kg)  01/20/21 207 lb 3.2 oz (94 kg)    Physical Exam Constitutional:      Appearance: Normal appearance.  Musculoskeletal:     Comments: Left Great toe without significant redness or tenderness on exam. Mild swelling noted.   Skin:    General: Skin is warm and dry.     Comments: Skin tag noted to left chest that has partial come away from skin.    Neurological:     Mental Status: He is alert and oriented to person, place, and time.    Labs reviewed: Basic Metabolic Panel: Recent Labs    01/20/21 1226  NA 137  K 4.7  CL 101  CO2 26  GLUCOSE 154*  BUN 20  CREATININE 1.07  CALCIUM 9.3   Liver Function Tests: No results for input(s): AST, ALT, ALKPHOS, BILITOT, PROT, ALBUMIN in the last 8760 hours. No results for input(s): LIPASE, AMYLASE in the last 8760 hours. No results for input(s): AMMONIA in the last 8760 hours. CBC: Recent Labs    01/20/21 1226  WBC 7.5  NEUTROABS 5,160  HGB 14.6  HCT 44.2  MCV 87.0  PLT 304   Lipid Panel: No results for input(s): CHOL, HDL, LDLCALC, TRIG, CHOLHDL, LDLDIRECT in the last 8760 hours. TSH: No results for input(s): TSH in the last 8760 hours. A1C: No results found for:  HGBA1C   Assessment/Plan 1. Skin tag -area prepped and cleaned with alcohol and betadine and skin take clipped. Pt tolerated well and bandage applied. Pt without significant bleeding. To notify if area continues to stay irritated, drainage or pain occurs  2. Acute gout involving toe of left foot, unspecified cause -has not had recent flare but not responsive to colchicine, ?OA flare. Will treat with prednisone at this time. To notify if symptoms worsen or fail to improve.  - predniSONE (STERAPRED UNI-PAK 21 TAB) 10 MG (21) TBPK tablet; Use as directed  Dispense: 21 tablet; Refill: 0   Next appt: 2 months  Aliciana Ricciardi K. [place], [facility] [phone]

## 2021-07-28 ENCOUNTER — Other Ambulatory Visit: Payer: Self-pay

## 2021-07-28 ENCOUNTER — Ambulatory Visit: Payer: Medicare Other

## 2021-07-28 DIAGNOSIS — R262 Difficulty in walking, not elsewhere classified: Secondary | ICD-10-CM

## 2021-07-28 DIAGNOSIS — M6281 Muscle weakness (generalized): Secondary | ICD-10-CM

## 2021-07-28 DIAGNOSIS — R269 Unspecified abnormalities of gait and mobility: Secondary | ICD-10-CM | POA: Diagnosis not present

## 2021-07-28 DIAGNOSIS — R296 Repeated falls: Secondary | ICD-10-CM

## 2021-07-28 DIAGNOSIS — R278 Other lack of coordination: Secondary | ICD-10-CM

## 2021-07-28 DIAGNOSIS — R2681 Unsteadiness on feet: Secondary | ICD-10-CM

## 2021-07-28 NOTE — Therapy (Addendum)
[place] MAIN [facility] SERVICES [address] [place], [place], [address] Phone: [phone]   Fax:  [phone]  Physical Therapy Treatment  Patient Details  Name: Tony Huffman MRN: [id number] Date of Birth: Feb 24, 1946 Referring Provider (PT): Dr. Joselyn Arrow   Encounter Date: 07/28/2021   PT End of Session - 07/28/21 1206     Visit Number 3    Number of Visits 25    Date for PT Re-Evaluation 10/13/21    Authorization Type Medicare Part B    Authorization Time Period 07/21/2021- 10/13/2021    Progress Note Due on Visit 10    PT Start Time 0847    PT Stop Time 0929    PT Time Calculation (min) 42 min    Equipment Utilized During Treatment Gait belt;Other (comment)   Left AFO   Activity Tolerance Patient tolerated treatment well    Behavior During Therapy [facility] for tasks assessed/performed             Past Medical History:  Diagnosis Date   Abnormal heart rhythm 10/20/2014   Addison's disease ([place])    Adrenal insufficiency ([place])    Allergy    Ambulates with cane    Anxiety    Arrhythmia    Cataract cortical, senile    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   Chronic kidney disease, stage 3 ([place]) 11/13/2011   Cough 03/29/2014   Edema leg 03/07/2017   Elevated cholesterol with high triglycerides 09/04/2013   Elevated red blood cell count    Erectile dysfunction    Falls frequently    Fever 03/29/2014   Generalized weakness 03/29/2014   GERD (gastroesophageal reflux disease)    Gout 09/04/2013   H/O adrenal insufficiency 05/17/2014   H/O atrial flutter 03/07/2017   H/O atrial flutter    H/O eye surgery    H/O sebaceous cyst 01/06/2018   H/O thyroid disease    H/O upper respiratory infection 10/15/2013   Hearing loss    History of back surgery    History of blepharoplasty    History of cancer 10/04/2015   History of chemotherapy    History of esophagogastroduodenoscopy (EGD)    History of prediabetes    Hyperlipidemia  06/16/2014   Hypertension    Hypertriglyceridemia    Hypothyroidism (acquired) 04/14/2014   Left leg swelling 11/23/2016   Low HDL (under 40)    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[place]) 11/10/2010   Malignant neoplasm of unspecified kidney, except renal pelvis (HCC)    OSA (obstructive sleep apnea) 07/24/2016   OSA (obstructive sleep apnea)    PAF (paroxysmal atrial fibrillation) (HCC)    PAF (paroxysmal atrial fibrillation) ([place])    Pancreatic mass 08/01/2018   Renal carcinoma ([place]) 06/16/2015   RLS (restless legs syndrome) 07/24/2016   Sleep apnea    Testicular hypofunction    Tubular adenoma 10/28/2017   Tussive syncopes 10/15/2013   Type 2 diabetes mellitus ([place])    Vitamin D deficiency disease     Past Surgical History:  Procedure Laterality Date   ADRENALECTOMY     CATARACT EXTRACTION  10/2014   CATARACT EXTRACTION EXTRACAPSULAR  11/05/2016   with Insertion Intraocular Prostheis.    COLONOSCOPY  05/06/2017   with removal lesions by snare.   COLONOSCOPY W/ BIOPSIES     05/06/2017   NEPHRECTOMY     POPLITEAL SYNOVIAL CYST EXCISION     SPINE SURGERY  1989   THYROIDECTOMY     THYROIDECTOMY, PARTIAL  TONSILLECTOMY      There were no vitals filed for this visit.   Subjective Assessment - 07/28/21 0853     Subjective Pt and wife report that the pt has been performing his HEP at home.  Pt notes he has some slight difficulty with the LE abduction and standing on the L LE due to weakness.  Pt notes it is a challenge, but he is able to do it.    Patient is accompained by: Family member   wife - Tony Huffman   Pertinent History Patient is a 75 year old male with referral from Neurology- Dr. Joselyn Arrow for unspecified abnormality of gait. He presents today with his wife and reports > 6 month history of progressive issues with poor balance and multiple falls. Patient has renal Cancer and currently has Pallative care- Was stabilized out of Hospice. Patient presents with the following  past medical history: A-Fib and flutter, Lung Cancer, Obstructive sleep apnea, Upper respiratory infection, Addison's disease, Hypothyroidism, Renal Cancer, DM, gout, arthritis, Chronic kidney disease and Restless leg syndrome. Patient lives with wife in independent living at [facility]. Prior level of function was independent with transfers and mobility using walker yet some assist with dressing.    Limitations Lifting;Standing;Walking;House hold activities    How long can you sit comfortably? No issues    How long can you stand comfortably? <5 min    How long can you walk comfortably? <5 min    Diagnostic tests EXAM:  MRI HEAD WITHOUT CONTRAST     TECHNIQUE:  Multiplanar, multiecho pulse sequences of the brain and surrounding  structures were obtained without intravenous contrast.     COMPARISON:  CT head without contrast 01/20/2021     FINDINGS:  Brain: Moderate generalized atrophy is present. 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 Ventricles are enlarged  bilaterally. There is mild ventricular asymmetry, the right is  larger.     No acute infarct, hemorrhage, or mass lesion is present. No other  significant extra-axial collection is present.     The internal auditory canals are within normal limits. The brainstem  and cerebellum are within normal limits.     Vascular: Flow is present in the major intracranial arteries.     Skull and upper cervical spine: The craniocervical junction is  normal. Upper cervical spine is within normal limits. Marrow signal  is unremarkable.     Sinuses/Orbits: The paranasal sinuses and mastoid air cells are  clear. Bilateral lens replacements are noted. Globes and orbits are  otherwise unremarkable.     IMPRESSION:  1. No acute intracranial abnormality.  2. Moderate generalized atrophy and white matter disease likely  reflects the sequela of chronic microvascular ischemia.  3. Arachnoid cyst over the left convexity.    Patient Stated Goals I would like to walk  better and not fall    Currently in Pain? Yes    Pain Score 2     Pain Location Foot    Pain Orientation Left    Pain Descriptors / Indicators Aching    Pain Type Acute pain    Pain Onset More than a month ago    Pain Onset More than a month ago                  TherEx:  Seated:    Hip abduction into rainbow physioball therapist applied resistance, 2x15 Hip adduction into rainbow physioball, 2x15 RTB hamstring curl 2x15 each LE RTB hip abduction  with therapist applied resistance, 2x10 each LE   Ambulate with 4WW, cueing for upright posture; 2x40 ft:  Total due to challenge , multimodal cueing; fatigue, and positional requirement.  Pt has significant weakness after performing bouts of walking.     Therapist generated HEP for pt to perform in between bouts of therapy sessions.  Pt and wife were emailed copy of exercises.    Neuro:  Static stance basketball toss with CGA, no support/AD x8 min  Balloon Volleyball with CGA and wife participation in batting balloon, 6 min   Seated rest breaks utilized throughout to prevent pt from fatiguing too quickly.   Pt educated throughout session about proper posture and technique with exercises. Improved exercise technique, movement at target joints, use of target muscles after min to mod verbal, visual, tactile cues           PT Short Term Goals - 07/22/21 0949       PT SHORT TERM GOAL #1   Title Pt will be independent with HEP in order to improve strength and balance in order to decrease fall risk and improve function at home and work.    Baseline 07/21/2021- Paitent presents with no formal HEP in place    Time 6    Period Weeks    Status New    Target Date 09/01/21               PT Long Term Goals - 07/22/21 0950       PT LONG TERM GOAL #1   Title Pt will improve FOTO to target score of 53  to display perceived improvements in ability to complete ADL's.    Baseline 07/21/2021= 45    Time 12    Period  Weeks    Status New    Target Date 10/13/21      PT LONG TERM GOAL #2   Title Pt will improve BERG by at least 5 points in order to demonstrate clinically significant improvement in balance.    Baseline 07/21/2021= 39/56    Time 12    Period Weeks    Status New    Target Date 10/13/21      PT LONG TERM GOAL #3   Title Pt will decrease 5TSTS by at least 5 seconds in order to demonstrate clinically significant improvement in LE strength.    Baseline 07/21/2021= 21 sec without UE Support    Time 12    Period Weeks    Status New    Target Date 10/13/21      PT LONG TERM GOAL #4   Title Pt will decrease TUG to below  18 seconds/decrease in order to demonstrate decreased fall risk.    Baseline 07/21/2021= 23 sec with 4WW    Time 12    Period Weeks    Status New    Target Date 10/13/21      PT LONG TERM GOAL #5   Title Pt will increase 10MWT by at least 0.23 m/s in order to demonstrate clinically significant improvement in community ambulation.    Baseline 07/21/2021= 0.68m/s    Time 12    Period Weeks    Status New    Target Date 10/13/21                   Plan - 07/28/21 1207     Clinical Impression Statement Pt tolerated treatment well and gave forth good effort throughout session.  Pt reports he enjoyed the therapy session  and making the challenging tasks fun with the balloon volleyball and basketball toss.  Pt still has difficulty with scuffing of feet during ambulation on the L LE, even with verbal cuing.  Pt performed well with the dynamic balance activities and will continue to benefit from skilled therapy to address remaining balance deficit and LE weakness.    Personal Factors and Comorbidities Comorbidity 3+    Comorbidities A- Fib, Renal cancer, Addison's disease, gout, arthritis, DM    Examination-Activity Limitations Caring for Others;Carry;Dressing;Lift;Reach Overhead;Squat;Stairs;Transfers;Stand    Examination-Participation Restrictions Acupuncturist;Yard Work;Shop    Stability/Clinical Decision Making Evolving/Moderate complexity    Rehab Potential Good    PT Frequency 2x / week    PT Duration 12 weeks    PT Treatment/Interventions ADLs/Self Care Home Management;Canalith Repostioning;Cryotherapy;Electrical Stimulation;Moist Heat;Traction;Ultrasound;DME Instruction;Gait training;Stair training;Functional mobility training;Therapeutic activities;Therapeutic exercise;Balance training;Neuromuscular re-education;Patient/family education;Orthotic Fit/Training;Wheelchair mobility training;Manual techniques;Passive range of motion;Dry needling;Energy conservation;Taping;Vestibular    PT Next Visit Plan Assess gait with use of AFO; initiate gait/transfer/balance training    PT Home Exercise Plan To be initiated next 1-2 visits    Consulted and Agree with Plan of Care Patient;Family member/caregiver    Family Member Consulted WIfe- Tony Huffman             Patient will benefit from skilled therapeutic intervention in order to improve the following deficits and impairments:  Abnormal gait, Decreased activity tolerance, Decreased balance, Decreased coordination, Decreased endurance, Decreased knowledge of use of DME, Decreased mobility, Decreased strength, Difficulty walking, Hypomobility, Impaired sensation, Impaired vision/preception, Postural dysfunction, Pain  Visit Diagnosis: Muscle weakness (generalized)  Abnormality of gait and mobility  Difficulty in walking, not elsewhere classified  Unsteadiness on feet  Other lack of coordination  Repeated falls     Problem List Patient Active Problem List   Diagnosis Date Noted   Addisons disease ([place]) 03/16/2021   Low HDL (under 40) 03/16/2021   Difficulty sleeping 11/21/2020   Numbness and tingling 11/21/2020   Difficulty walking 01/14/2020   History of recent fall 01/14/2020   Sleep disorder 01/14/2020   Pancreatic mass 08/01/2018   H/O sebaceous cyst  01/06/2018   Tubular adenoma 10/28/2017   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   Edema leg 03/07/2017   Hx of atrial flutter 03/07/2017   OSA (obstructive sleep apnea) 07/24/2016   RLS (restless legs syndrome) 07/24/2016   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   Atrial fibrillation and flutter ([place]) 10/20/2014   Hyperlipidemia 06/16/2014   Adrenal crisis ([place]) 04/19/2014   Cough 03/29/2014   Weakness 03/29/2014   Cough syncope 10/15/2013   URI (upper respiratory infection) 10/15/2013   Dyslipidemia 09/04/2013   Gout 09/04/2013   Chronic kidney disease, stage III (moderate) ([place]) 11/13/2011   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   Hypothyroidism (acquired) 11/30/2010   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[place]) 11/10/2010    Gwenlyn Saran, PT, DPT 07/28/21, 12:27 PM   Christie Nottingham, PT 07/28/2021, 12:27 PM  [place] MAIN [facility] SERVICES [address] [place], [place], [address] Phone: [phone]   Fax:  [phone]  Name: Gill Delrossi MRN: [id number] Date of Birth: April 17, 1946

## 2021-07-31 ENCOUNTER — Ambulatory Visit: Payer: Medicare Other

## 2021-07-31 ENCOUNTER — Other Ambulatory Visit: Payer: Self-pay

## 2021-07-31 DIAGNOSIS — R269 Unspecified abnormalities of gait and mobility: Secondary | ICD-10-CM

## 2021-07-31 DIAGNOSIS — R262 Difficulty in walking, not elsewhere classified: Secondary | ICD-10-CM

## 2021-07-31 DIAGNOSIS — R2681 Unsteadiness on feet: Secondary | ICD-10-CM

## 2021-07-31 DIAGNOSIS — R278 Other lack of coordination: Secondary | ICD-10-CM

## 2021-07-31 DIAGNOSIS — R296 Repeated falls: Secondary | ICD-10-CM

## 2021-07-31 DIAGNOSIS — M6281 Muscle weakness (generalized): Secondary | ICD-10-CM

## 2021-07-31 NOTE — Therapy (Signed)
[place] MAIN [facility] SERVICES [address] [place], [place], [address] Phone: [phone]   Fax:  [phone]  Physical Therapy Treatment  Patient Details  Name: Tony Huffman MRN: [id number] Date of Birth: Jul 29, 1946 Referring Provider (PT): Dr. Joselyn Arrow   Encounter Date: 07/31/2021   PT End of Session - 07/31/21 0853     Visit Number 4    Number of Visits 25    Date for PT Re-Evaluation 10/13/21    Authorization Type Medicare Part B    Authorization Time Period 07/21/2021- 10/13/2021    Progress Note Due on Visit 10    PT Start Time 0800    PT Stop Time 0846    PT Time Calculation (min) 46 min    Equipment Utilized During Treatment Gait belt;Other (comment)   Left AFO   Activity Tolerance Patient tolerated treatment well    Behavior During Therapy [facility] for tasks assessed/performed             Past Medical History:  Diagnosis Date   Abnormal heart rhythm 10/20/2014   Addison's disease ([place])    Adrenal insufficiency ([place])    Allergy    Ambulates with cane    Anxiety    Arrhythmia    Cataract cortical, senile    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   Chronic kidney disease, stage 3 ([place]) 11/13/2011   Cough 03/29/2014   Edema leg 03/07/2017   Elevated cholesterol with high triglycerides 09/04/2013   Elevated red blood cell count    Erectile dysfunction    Falls frequently    Fever 03/29/2014   Generalized weakness 03/29/2014   GERD (gastroesophageal reflux disease)    Gout 09/04/2013   H/O adrenal insufficiency 05/17/2014   H/O atrial flutter 03/07/2017   H/O atrial flutter    H/O eye surgery    H/O sebaceous cyst 01/06/2018   H/O thyroid disease    H/O upper respiratory infection 10/15/2013   Hearing loss    History of back surgery    History of blepharoplasty    History of cancer 10/04/2015   History of chemotherapy    History of esophagogastroduodenoscopy (EGD)    History of prediabetes    Hyperlipidemia  06/16/2014   Hypertension    Hypertriglyceridemia    Hypothyroidism (acquired) 04/14/2014   Left leg swelling 11/23/2016   Low HDL (under 40)    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[place]) 11/10/2010   Malignant neoplasm of unspecified kidney, except renal pelvis (HCC)    OSA (obstructive sleep apnea) 07/24/2016   OSA (obstructive sleep apnea)    PAF (paroxysmal atrial fibrillation) (HCC)    PAF (paroxysmal atrial fibrillation) ([place])    Pancreatic mass 08/01/2018   Renal carcinoma ([place]) 06/16/2015   RLS (restless legs syndrome) 07/24/2016   Sleep apnea    Testicular hypofunction    Tubular adenoma 10/28/2017   Tussive syncopes 10/15/2013   Type 2 diabetes mellitus ([place])    Vitamin D deficiency disease     Past Surgical History:  Procedure Laterality Date   ADRENALECTOMY     CATARACT EXTRACTION  10/2014   CATARACT EXTRACTION EXTRACAPSULAR  11/05/2016   with Insertion Intraocular Prostheis.    COLONOSCOPY  05/06/2017   with removal lesions by snare.   COLONOSCOPY W/ BIOPSIES     05/06/2017   NEPHRECTOMY     POPLITEAL SYNOVIAL CYST EXCISION     SPINE SURGERY  1989   THYROIDECTOMY     THYROIDECTOMY, PARTIAL  TONSILLECTOMY      There were no vitals filed for this visit.   Subjective Assessment - 07/31/21 0800     Subjective Pt reports gout in L big toe. No falls or new complaints. On prednisone for gout.    Patient is accompained by: Family member   [name]   Pertinent History Patient is a 75 year old male with referral from Neurology- Dr. Joselyn Arrow for unspecified abnormality of gait. He presents today with his wife and reports > 6 month history of progressive issues with poor balance and multiple falls. Patient has renal Cancer and currently has Pallative care- Was stabilized out of Hospice. Patient presents with the following past medical history: A-Fib and flutter, Lung Cancer, Obstructive sleep apnea, Upper respiratory infection, Addison's disease, Hypothyroidism,  Renal Cancer, DM, gout, arthritis, Chronic kidney disease and Restless leg syndrome. Patient lives with wife in independent living at [facility]. Prior level of function was independent with transfers and mobility using walker yet some assist with dressing.    Limitations Lifting;Standing;Walking;House hold activities    How long can you sit comfortably? No issues    How long can you stand comfortably? <5 min    How long can you walk comfortably? <5 min    Diagnostic tests EXAM:  MRI HEAD WITHOUT CONTRAST     TECHNIQUE:  Multiplanar, multiecho pulse sequences of the brain and surrounding  structures were obtained without intravenous contrast.     COMPARISON:  CT head without contrast 01/20/2021     FINDINGS:  Brain: Moderate generalized atrophy is present. 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 Ventricles are enlarged  bilaterally. There is mild ventricular asymmetry, the right is  larger.     No acute infarct, hemorrhage, or mass lesion is present. No other  significant extra-axial collection is present.     The internal auditory canals are within normal limits. The brainstem  and cerebellum are within normal limits.     Vascular: Flow is present in the major intracranial arteries.     Skull and upper cervical spine: The craniocervical junction is  normal. Upper cervical spine is within normal limits. Marrow signal  is unremarkable.     Sinuses/Orbits: The paranasal sinuses and mastoid air cells are  clear. Bilateral lens replacements are noted. Globes and orbits are  otherwise unremarkable.     IMPRESSION:  1. No acute intracranial abnormality.  2. Moderate generalized atrophy and white matter disease likely  reflects the sequela of chronic microvascular ischemia.  3. Arachnoid cyst over the left convexity.    Patient Stated Goals I would like to walk better and not fall    Currently in Pain? No/denies    Pain Onset More than a month ago    Pain Onset More than a month ago              Gait Training:   2x140' with 4WW. Initial poor L foot clearance during swing phase despite donned AFO, and 4WW outside of BOS. Vc'ing provided and PT demo on L hip flexion to improve swing phase on L foot as compensation for no DF on L foot and keep 4WW closer to BOS to reduce risk of falls. Fair carryover on second lap after education, demo, and cuing. Pt still significantly fatigue quickly with upright mobility making difficult for pt to maintain proper mechanics. Post session, pt able to amb 40' with improve L foot clearance in swing phase with hip flexion  and keeping 4WW within BOS. CGA provided throughout for safety.   There.ex:   Seated hip adduction: 1x10. 2x15, 3 sec hold   Standing hip flexion with SUE support: 2x12/LE. 2x12/LE with 3# AW's, SBA  Standing hip abduction with RTB: 3x12 with BUE support needed  Standing hip extension with RTB: 3x12 with BUE support needed.    Multimodal cuing for upright posture and TC's on shoulder/hips to reduce R lat lean compensation for L hip abd.   Neuro Re-Ed:   Side steps no UE support in // bars: 2x6 laps, CGA with cuing for large steps to improve glut med activation  Static weight shifts lateral no UE support. Progressed to LLE on Airex pad. X12/LE per progression, CGA provided.      PT Education - 07/31/21 0802     Education Details form/technique with therex    Person(s) Educated Patient    Methods Explanation;Demonstration;Tactile cues;Verbal cues    Comprehension Verbalized understanding;Returned demonstration              PT Short Term Goals - 07/22/21 0949       PT SHORT TERM GOAL #1   Title Pt will be independent with HEP in order to improve strength and balance in order to decrease fall risk and improve function at home and work.    Baseline 07/21/2021- Paitent presents with no formal HEP in place    Time 6    Period Weeks    Status New    Target Date 09/01/21               PT Long Term Goals - 07/22/21  0950       PT LONG TERM GOAL #1   Title Pt will improve FOTO to target score of 53  to display perceived improvements in ability to complete ADL's.    Baseline 07/21/2021= 45    Time 12    Period Weeks    Status New    Target Date 10/13/21      PT LONG TERM GOAL #2   Title Pt will improve BERG by at least 5 points in order to demonstrate clinically significant improvement in balance.    Baseline 07/21/2021= 39/56    Time 12    Period Weeks    Status New    Target Date 10/13/21      PT LONG TERM GOAL #3   Title Pt will decrease 5TSTS by at least 5 seconds in order to demonstrate clinically significant improvement in LE strength.    Baseline 07/21/2021= 21 sec without UE Support    Time 12    Period Weeks    Status New    Target Date 10/13/21      PT LONG TERM GOAL #4   Title Pt will decrease TUG to below  18 seconds/decrease in order to demonstrate decreased fall risk.    Baseline 07/21/2021= 23 sec with 4WW    Time 12    Period Weeks    Status New    Target Date 10/13/21      PT LONG TERM GOAL #5   Title Pt will increase 10MWT by at least 0.23 m/s in order to demonstrate clinically significant improvement in community ambulation.    Baseline 07/21/2021= 0.47m/s    Time 12    Period Weeks    Status New    Target Date 10/13/21                   Plan -  07/31/21 0853     Clinical Impression Statement Continuing with PT POC with focus on balance, gait, and LE strengthening. Pt very motivated to improve his function throughout session. Pt able to improve his gait mechanics with education and cuing but remains limited in consistent carryover due to pt quick to fatigue with upright mobility. Pt requires progression in upright therex in order to improve tolerance for upright, weightbearing to improve function/LE strength and endurance. Pt able to tolerate static standing with no UE support but with ambulation, pt does require at least SUE support. Will continue to  benefit from skilled PT treatment to progress balance, strength, endurance and ambulation to reduce risk of falls and optimize independence with ADL's.    Personal Factors and Comorbidities Comorbidity 3+    Comorbidities A- Fib, Renal cancer, Addison's disease, gout, arthritis, DM    Examination-Activity Limitations Caring for Others;Carry;Dressing;Lift;Reach Overhead;Squat;Stairs;Transfers;Stand    Examination-Participation Restrictions Investment banker, operational;Yard Work;Shop    Stability/Clinical Decision Making Evolving/Moderate complexity    Rehab Potential Good    PT Frequency 2x / week    PT Duration 12 weeks    PT Treatment/Interventions ADLs/Self Care Home Management;Canalith Repostioning;Cryotherapy;Electrical Stimulation;Moist Heat;Traction;Ultrasound;DME Instruction;Gait training;Stair training;Functional mobility training;Therapeutic activities;Therapeutic exercise;Balance training;Neuromuscular re-education;Patient/family education;Orthotic Fit/Training;Wheelchair mobility training;Manual techniques;Passive range of motion;Dry needling;Energy conservation;Taping;Vestibular    PT Next Visit Plan Upright therex to improve tolerance for standing/walking ADL's    PT Home Exercise Plan To be initiated next 1-2 visits    Consulted and Agree with Plan of Care Patient;Family member/caregiver    Family Member Consulted WIfe- [name]             Patient will benefit from skilled therapeutic intervention in order to improve the following deficits and impairments:  Abnormal gait, Decreased activity tolerance, Decreased balance, Decreased coordination, Decreased endurance, Decreased knowledge of use of DME, Decreased mobility, Decreased strength, Difficulty walking, Hypomobility, Impaired sensation, Impaired vision/preception, Postural dysfunction, Pain  Visit Diagnosis: Muscle weakness (generalized)  Abnormality of gait and mobility  Difficulty in walking, not elsewhere  classified  Unsteadiness on feet  Repeated falls  Other lack of coordination     Problem List Patient Active Problem List   Diagnosis Date Noted   Addisons disease ([place]) 03/16/2021   Low HDL (under 40) 03/16/2021   Difficulty sleeping 11/21/2020   Numbness and tingling 11/21/2020   Difficulty walking 01/14/2020   History of recent fall 01/14/2020   Sleep disorder 01/14/2020   Pancreatic mass 08/01/2018   H/O sebaceous cyst 01/06/2018   Tubular adenoma 10/28/2017   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   Edema leg 03/07/2017   Hx of atrial flutter 03/07/2017   OSA (obstructive sleep apnea) 07/24/2016   RLS (restless legs syndrome) 07/24/2016   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 Fairly IV, PT, DPT Physical Therapist- [facility]  07/31/2021, 9:07 AM  [place] MAIN [facility] SERVICES [address] [place], [place], [address] Phone: [phone]   Fax:  [phone]  Name: Tony Huffman  MRN: [id number] Date of Birth: May 01, 1946

## 2021-08-04 ENCOUNTER — Encounter: Payer: Self-pay | Admitting: Physical Therapy

## 2021-08-04 ENCOUNTER — Ambulatory Visit: Payer: Medicare Other | Attending: Neurology | Admitting: Physical Therapy

## 2021-08-04 ENCOUNTER — Other Ambulatory Visit: Payer: Self-pay

## 2021-08-04 DIAGNOSIS — R269 Unspecified abnormalities of gait and mobility: Secondary | ICD-10-CM | POA: Insufficient documentation

## 2021-08-04 DIAGNOSIS — R278 Other lack of coordination: Secondary | ICD-10-CM | POA: Insufficient documentation

## 2021-08-04 DIAGNOSIS — R2689 Other abnormalities of gait and mobility: Secondary | ICD-10-CM | POA: Insufficient documentation

## 2021-08-04 DIAGNOSIS — R262 Difficulty in walking, not elsewhere classified: Secondary | ICD-10-CM | POA: Insufficient documentation

## 2021-08-04 DIAGNOSIS — R2681 Unsteadiness on feet: Secondary | ICD-10-CM | POA: Insufficient documentation

## 2021-08-04 DIAGNOSIS — M6281 Muscle weakness (generalized): Secondary | ICD-10-CM | POA: Insufficient documentation

## 2021-08-04 NOTE — Therapy (Signed)
[place] MAIN [facility] SERVICES [address] [place], [place], [address] Phone: [phone]   Fax:  [phone]  Physical Therapy Treatment  Patient Details  Name: Tony Huffman MRN: [id number] Date of Birth: [birth year] Referring Provider (PT): Dr. Joselyn Arrow   Encounter Date: 08/04/2021   PT End of Session - 08/04/21 1143     Visit Number 5    Number of Visits 25    Date for PT Re-Evaluation 10/13/21    Authorization Type Medicare Part B    Authorization Time Period 07/21/2021- 10/13/2021    Progress Note Due on Visit 10    PT Start Time 0849    PT Stop Time 0930    PT Time Calculation (min) 41 min    Equipment Utilized During Treatment Gait belt;Other (comment)   Left AFO   Activity Tolerance Patient tolerated treatment well    Behavior During Therapy [facility] for tasks assessed/performed             Past Medical History:  Diagnosis Date   Abnormal heart rhythm 10/20/2014   Addison's disease ([place])    Adrenal insufficiency ([place])    Allergy    Ambulates with cane    Anxiety    Arrhythmia    Cataract cortical, senile    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   Chronic kidney disease, stage 3 ([place]) 11/13/2011   Cough 03/29/2014   Edema leg 03/07/2017   Elevated cholesterol with high triglycerides 09/04/2013   Elevated red blood cell count    Erectile dysfunction    Falls frequently    Fever 03/29/2014   Generalized weakness 03/29/2014   GERD (gastroesophageal reflux disease)    Gout 09/04/2013   H/O adrenal insufficiency 05/17/2014   H/O atrial flutter 03/07/2017   H/O atrial flutter    H/O eye surgery    H/O sebaceous cyst 01/06/2018   H/O thyroid disease    H/O upper respiratory infection 10/15/2013   Hearing loss    History of back surgery    History of blepharoplasty    History of cancer 10/04/2015   History of chemotherapy    History of esophagogastroduodenoscopy (EGD)    History of prediabetes    Hyperlipidemia  06/16/2014   Hypertension    Hypertriglyceridemia    Hypothyroidism (acquired) 04/14/2014   Left leg swelling 11/23/2016   Low HDL (under 40)    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[place]) 11/10/2010   Malignant neoplasm of unspecified kidney, except renal pelvis (HCC)    OSA (obstructive sleep apnea) 07/24/2016   OSA (obstructive sleep apnea)    PAF (paroxysmal atrial fibrillation) (HCC)    PAF (paroxysmal atrial fibrillation) ([place])    Pancreatic mass 08/01/2018   Renal carcinoma ([place]) 06/16/2015   RLS (restless legs syndrome) 07/24/2016   Sleep apnea    Testicular hypofunction    Tubular adenoma 10/28/2017   Tussive syncopes 10/15/2013   Type 2 diabetes mellitus ([place])    Vitamin D deficiency disease     Past Surgical History:  Procedure Laterality Date   ADRENALECTOMY     CATARACT EXTRACTION  10/2014   CATARACT EXTRACTION EXTRACAPSULAR  11/05/2016   with Insertion Intraocular Prostheis.    COLONOSCOPY  05/06/2017   with removal lesions by snare.   COLONOSCOPY W/ BIOPSIES     05/06/2017   NEPHRECTOMY     POPLITEAL SYNOVIAL CYST EXCISION     SPINE SURGERY  1989   THYROIDECTOMY     THYROIDECTOMY, PARTIAL  TONSILLECTOMY      There were no vitals filed for this visit.   Subjective Assessment - 08/04/21 0852     Subjective Pt reports gout has cleared up. No falls or LOB. No questions on HEP, pt is compliant.    Patient is accompained by: Family member   [name]   Pertinent History Patient is a 75 year old male with referral from Neurology- Dr. Joselyn Arrow for unspecified abnormality of gait. He presents today with his wife and reports > 6 month history of progressive issues with poor balance and multiple falls. Patient has renal Cancer and currently has Pallative care- Was stabilized out of Hospice. Patient presents with the following past medical history: A-Fib and flutter, Lung Cancer, Obstructive sleep apnea, Upper respiratory infection, Addison's disease,  Hypothyroidism, Renal Cancer, DM, gout, arthritis, Chronic kidney disease and Restless leg syndrome. Patient lives with wife in independent living at [facility]. Prior level of function was independent with transfers and mobility using walker yet some assist with dressing.    Limitations Lifting;Standing;Walking;House hold activities    How long can you sit comfortably? No issues    How long can you stand comfortably? <5 min    How long can you walk comfortably? <5 min    Diagnostic tests EXAM:  MRI HEAD WITHOUT CONTRAST     TECHNIQUE:  Multiplanar, multiecho pulse sequences of the brain and surrounding  structures were obtained without intravenous contrast.     COMPARISON:  CT head without contrast 01/20/2021     FINDINGS:  Brain: Moderate generalized atrophy is present. 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 Ventricles are enlarged  bilaterally. There is mild ventricular asymmetry, the right is  larger.     No acute infarct, hemorrhage, or mass lesion is present. No other  significant extra-axial collection is present.     The internal auditory canals are within normal limits. The brainstem  and cerebellum are within normal limits.     Vascular: Flow is present in the major intracranial arteries.     Skull and upper cervical spine: The craniocervical junction is  normal. Upper cervical spine is within normal limits. Marrow signal  is unremarkable.     Sinuses/Orbits: The paranasal sinuses and mastoid air cells are  clear. Bilateral lens replacements are noted. Globes and orbits are  otherwise unremarkable.     IMPRESSION:  1. No acute intracranial abnormality.  2. Moderate generalized atrophy and white matter disease likely  reflects the sequela of chronic microvascular ischemia.  3. Arachnoid cyst over the left convexity.    Patient Stated Goals I would like to walk better and not fall    Currently in Pain? No/denies    Pain Onset More than a month ago    Pain Onset More than a month ago                INTERVENTIONS  Gait Training:  2x140' with 4WW, pt wearing AFO. Initially good L foot clearance during swing phase. PT also provided VC to remain close to 4WW. Distance from 4WW increased with fatigue due to lag in cadence of L step and decreased step length of R (2/2 decreased WB through L). Good carryover of cueing during initial 75ft of 2nd lap - overall technique diminished with fatigue. CGA provided throughout for safety.  Knee drive with posterior swing through to extension (toe off phase), 3x12 each side; cueing to knee PT hand (target) with power.  There.ex:  Seated hip abduction with 3 second hold using RTB, one side at a time to focus on activation/stabilization of L hip; 2x12 each side.  Standing at support bar, BUE support: Lateral stepping over 6" hurdle, 2x10 BLE. VC on hip flexion rather than knee flexion to clear hurdle on LLE. 2x12 each side. Hip extension 2x12 with 3 second hold;  Multimodal cuing for upright posture and TC's on shoulder/hips to reduce R lat lean compensation for L hip abd.   Multiple STS throughout session for seated rest breaks between standing exercises.    Neuro Re-Ed:  Alternating toe taps to 4" step, encouraged to remove UE support (progressed down to 2 fingers of 1 hand). 2x20 taps each side.       Clinical Impression: Pt demonstrates excellent motivation through PT session. He performed 2 laps of gait training with improved technique on the second lap; technique including L foot clearance and proximity to Rollator declined with distance due to muscular fatigue. PT focuses session on hip flexor strength and knee drive. Pt did ambulate with improved L foot clearance after therex as he walked out of the gym, indicating carryover of gait mechanics training. PT would like to continue this training and increase muscular endurance for ability to ambulate with improved mechanics for greater distances (>63ft). Pt will continue to  benefit from skilled PT to progress balance, strength, ambulatory capacity and gait mechanics to optimize independence with ADLs.           PT Short Term Goals - 07/22/21 0949       PT SHORT TERM GOAL #1   Title Pt will be independent with HEP in order to improve strength and balance in order to decrease fall risk and improve function at home and work.    Baseline 07/21/2021- Paitent presents with no formal HEP in place    Time 6    Period Weeks    Status New    Target Date 09/01/21               PT Long Term Goals - 07/22/21 0950       PT LONG TERM GOAL #1   Title Pt will improve FOTO to target score of 53  to display perceived improvements in ability to complete ADL's.    Baseline 07/21/2021= 45    Time 12    Period Weeks    Status New    Target Date 10/13/21      PT LONG TERM GOAL #2   Title Pt will improve BERG by at least 5 points in order to demonstrate clinically significant improvement in balance.    Baseline 07/21/2021= 39/56    Time 12    Period Weeks    Status New    Target Date 10/13/21      PT LONG TERM GOAL #3   Title Pt will decrease 5TSTS by at least 5 seconds in order to demonstrate clinically significant improvement in LE strength.    Baseline 07/21/2021= 21 sec without UE Support    Time 12    Period Weeks    Status New    Target Date 10/13/21      PT LONG TERM GOAL #4   Title Pt will decrease TUG to below  18 seconds/decrease in order to demonstrate decreased fall risk.    Baseline 07/21/2021= 23 sec with 4WW    Time 12    Period Weeks    Status New    Target Date 10/13/21  PT LONG TERM GOAL #5   Title Pt will increase 10MWT by at least 0.23 m/s in order to demonstrate clinically significant improvement in community ambulation.    Baseline 07/21/2021= 0.67m/s    Time 12    Period Weeks    Status New    Target Date 10/13/21                   Plan - 08/04/21 1143     Clinical Impression Statement Pt demonstrates  excellent motivation through PT session. He performed 2 laps of gait training with improved technique on the second lap; technique including L foot clearance and proximity to Rollator declined with distance due to muscular fatigue. PT focuses session on hip flexor strength and knee drive. Pt did ambulate with improved L foot clearance after therex as he walked out of the gym, indicating carryover of gait mechanics training. PT would like to continue this training and increase muscular endurance for ability to ambulate with improved mechanics for greater distances (>69ft). Pt will continue to benefit from skilled PT to progress balance, strength, ambulatory capacity and gait mechanics to optimize independence with ADLs.    Personal Factors and Comorbidities Comorbidity 3+    Comorbidities A- Fib, Renal cancer, Addison's disease, gout, arthritis, DM    Examination-Activity Limitations Caring for Others;Carry;Dressing;Lift;Reach Overhead;Squat;Stairs;Transfers;Stand    Examination-Participation Restrictions Investment banker, operational;Yard Work;Shop    Stability/Clinical Decision Making Evolving/Moderate complexity    Rehab Potential Good    PT Frequency 2x / week    PT Duration 12 weeks    PT Treatment/Interventions ADLs/Self Care Home Management;Canalith Repostioning;Cryotherapy;Electrical Stimulation;Moist Heat;Traction;Ultrasound;DME Instruction;Gait training;Stair training;Functional mobility training;Therapeutic activities;Therapeutic exercise;Balance training;Neuromuscular re-education;Patient/family education;Orthotic Fit/Training;Wheelchair mobility training;Manual techniques;Passive range of motion;Dry needling;Energy conservation;Taping;Vestibular    PT Next Visit Plan Upright therex to improve tolerance for standing/walking ADL's    PT Home Exercise Plan To be initiated next 1-2 visits    Consulted and Agree with Plan of Care Patient;Family member/caregiver    Family Member Consulted  WIfe- [name]             Patient will benefit from skilled therapeutic intervention in order to improve the following deficits and impairments:  Abnormal gait, Decreased activity tolerance, Decreased balance, Decreased coordination, Decreased endurance, Decreased knowledge of use of DME, Decreased mobility, Decreased strength, Difficulty walking, Hypomobility, Impaired sensation, Impaired vision/preception, Postural dysfunction, Pain  Visit Diagnosis: Abnormality of gait and mobility  Other abnormalities of gait and mobility  Difficulty in walking, not elsewhere classified  Other lack of coordination  Muscle weakness (generalized)  Unsteadiness on feet     Problem List Patient Active Problem List   Diagnosis Date Noted   Addisons disease ([place]) 03/16/2021   Low HDL (under 40) 03/16/2021   Difficulty sleeping 11/21/2020   Numbness and tingling 11/21/2020   Difficulty walking 01/14/2020   History of recent fall 01/14/2020   Sleep disorder 01/14/2020   Pancreatic mass 08/01/2018   H/O sebaceous cyst 01/06/2018   Tubular adenoma 10/28/2017   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   Edema leg 03/07/2017   Hx of atrial flutter 03/07/2017   OSA (obstructive sleep apnea) 07/24/2016   RLS (restless legs syndrome) 07/24/2016   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   Atrial fibrillation and flutter ([place]) 10/20/2014   Hyperlipidemia 06/16/2014   Adrenal crisis ([place]) 04/19/2014   Cough 03/29/2014   Weakness 03/29/2014   Cough syncope 10/15/2013   URI (upper respiratory infection) 10/15/2013   Dyslipidemia 09/04/2013   Gout  09/04/2013   Chronic kidney disease, stage III (moderate) (HCC) 11/13/2011   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   Hypothyroidism (acquired) 11/30/2010   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[place]) 11/10/2010    Patrina Levering PT, DPT  [place] MAIN [facility] SERVICES [address] [place], [place],  [address] Phone: [phone]   Fax:  [phone]  Name: Haylen Shelnutt MRN: [id number] Date of Birth: 1946-09-17

## 2021-08-06 IMAGING — MR MR HEAD W/O CM
13 series · 48 of 48 positions shown · non-contrast
Comparison: CT head without contrast 01/20/2021

CLINICAL DATA: Multiple falls abnormal balance.

EXAM:
MRI HEAD WITHOUT CONTRAST
TECHNIQUE: Multiplanar, multiecho pulse sequences of the brain and surrounding
structures were obtained without intravenous contrast.

[Series 5: ax dwi_tracew · axial · 3.0mm · 0.65mm/px · z∈[-98,+56]mm · 5 of 96 slices shown]
[im 1/96]
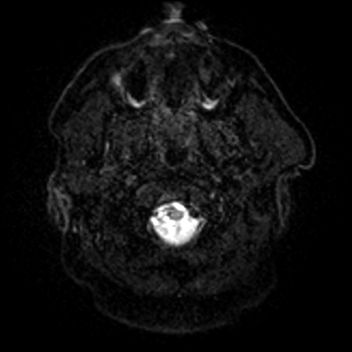
[im 24/96]
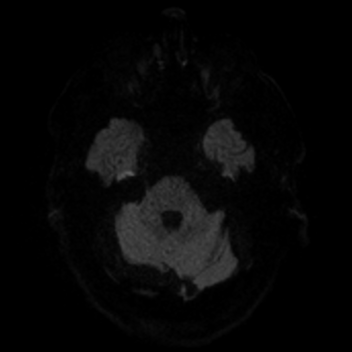
[im 48/96]
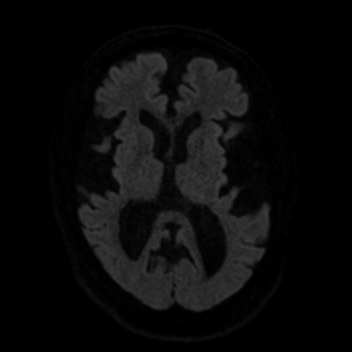
[im 72/96]
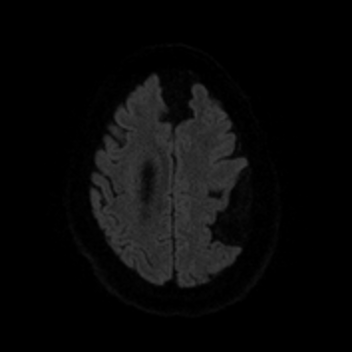
[im 96/96]
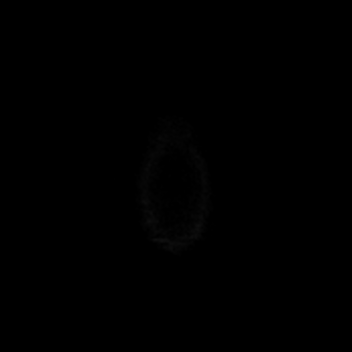

[Series 6: ax dwi_adc · axial · 3.0mm · 0.65mm/px · z∈[-98,+56]mm · 3 of 48 slices shown]
[im 1/48]
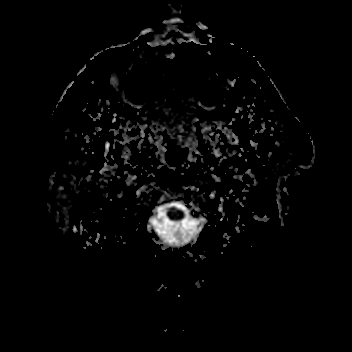
[im 24/48]
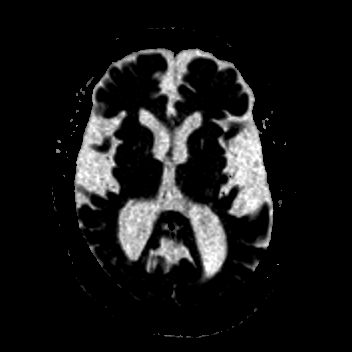
[im 48/48]
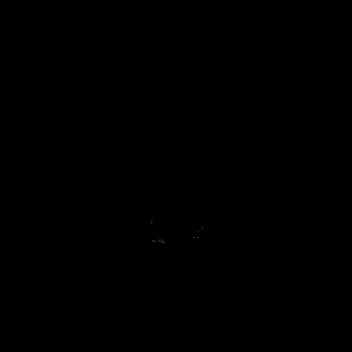

[Series 7: cor dwi_tracew · coronal · 5.0mm · 0.60mm/px · 5 of 76 slices shown]
[im 1/76]
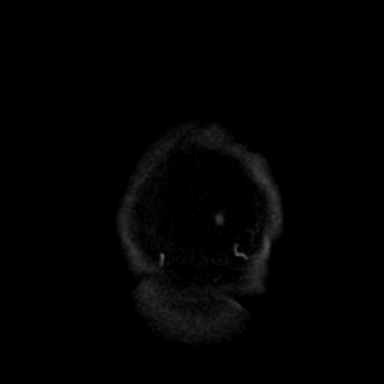
[im 19/76]
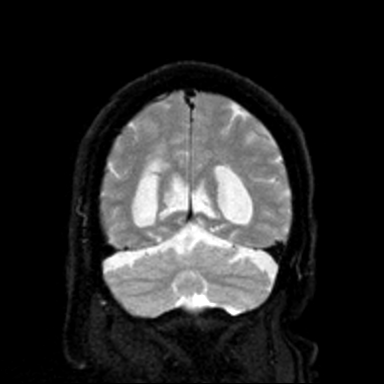
[im 38/76]
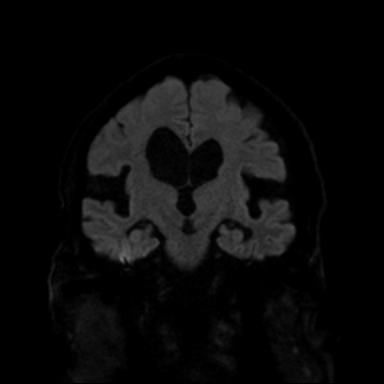
[im 57/76]
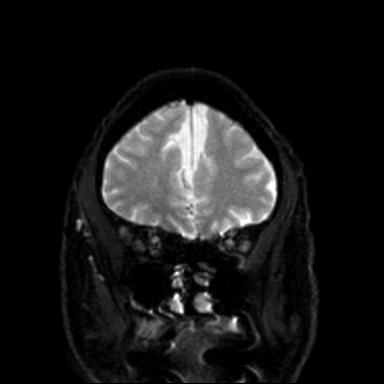
[im 76/76]
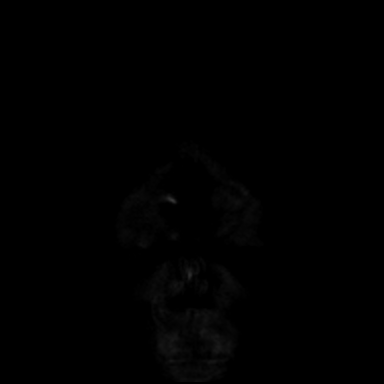

[Series 8: cor dwi_adc · coronal · 5.0mm · 0.60mm/px · 2 of 38 slices shown]
[im 1/38]
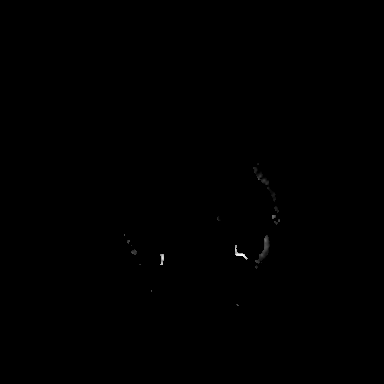
[im 38/38]
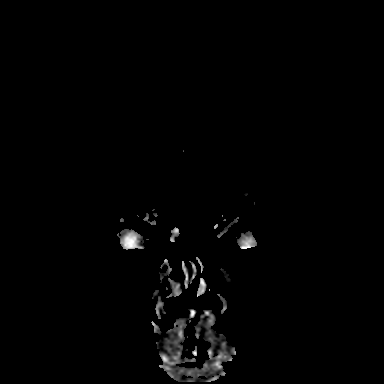

[Series 9: T1 · sagittal · 5.0mm · 0.62mm/px · 1 of 25 slices shown (1 of 2)]
[im 1/25]
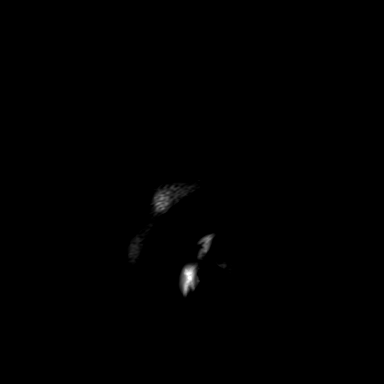

[Series 10: T2 · axial · 5.0mm · 0.53mm/px · 1 of 25 slices shown (1 of 2)]
[im 1/25]
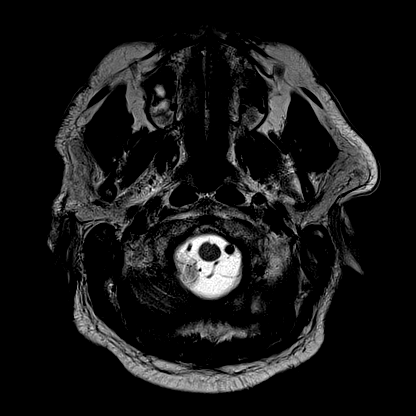

[Series 11: mag_images · axial · 3.0mm · 0.90mm/px · z∈[-107,+67]mm · 4 of 60 slices shown]
[im 1/60]
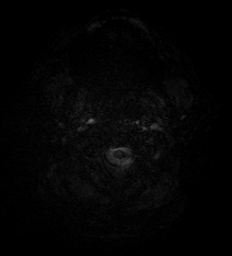
[im 20/60]
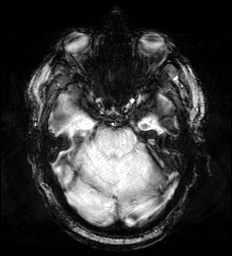
[im 40/60]
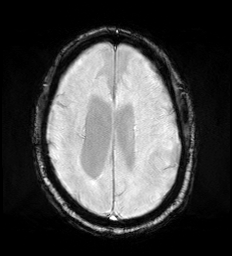
[im 60/60]
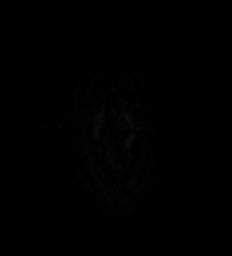

[Series 12: pha_images · axial · 3.0mm · 0.90mm/px · z∈[-107,+67]mm · 4 of 60 slices shown]
[im 1/60]
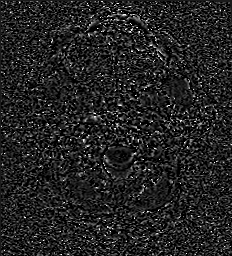
[im 20/60]
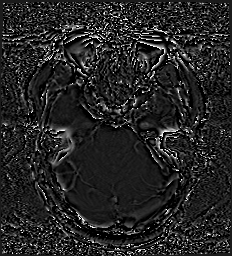
[im 40/60]
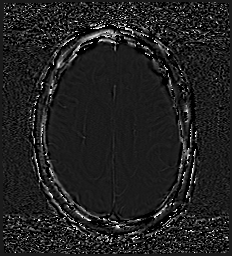
[im 60/60]
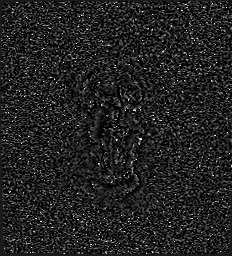

[Series 13: swi_images · axial · 3.0mm · 0.90mm/px · z∈[-107,+67]mm · 4 of 60 slices shown]
[im 1/60]
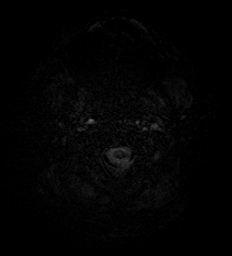
[im 20/60]
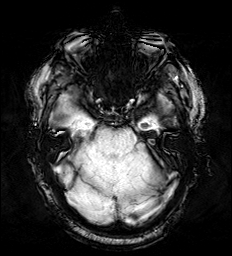
[im 40/60]
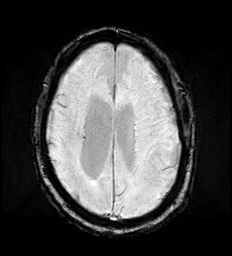
[im 60/60]
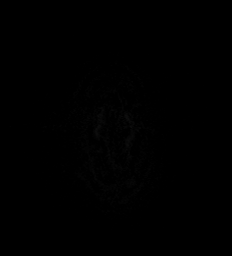

[Series 14: mip_images(sw) · axial · 24.0mm · 0.90mm/px · z∈[-97,+57]mm · 3 of 53 slices shown]
[im 1/53]
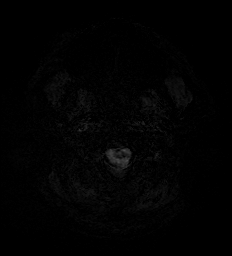
[im 27/53]
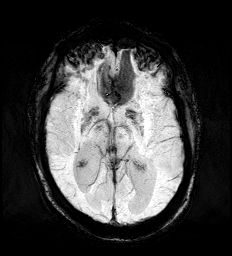
[im 53/53]
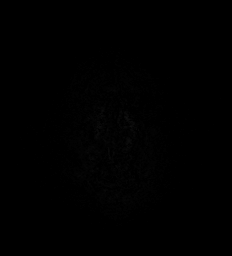

[Series 15: FLAIR · axial · 3.0mm · 0.53mm/px · z∈[-101,+59]mm · 3 of 55 slices shown]
[im 1/55]
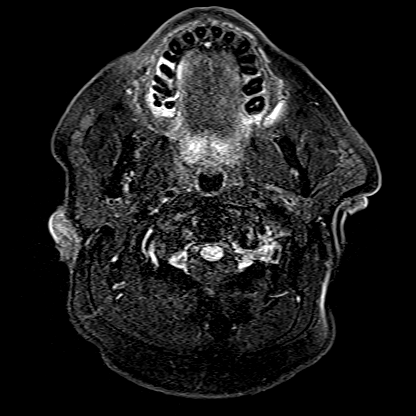
[im 28/55]
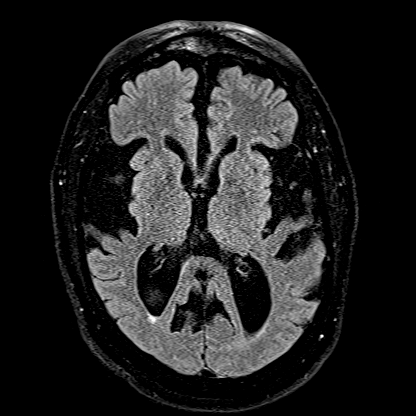
[im 55/55]
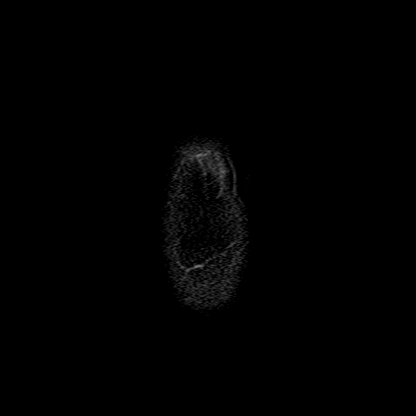

[Series 16: T1 · axial · 1.0mm · 0.98mm/px · z∈[-106,+67]mm · 11 of 176 slices shown (2 of 2)]
[im 1/176]
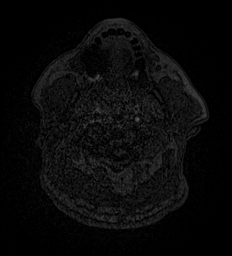
[im 18/176]
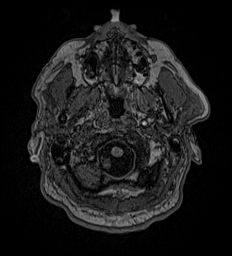
[im 36/176]
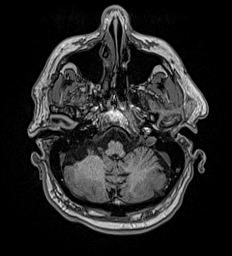
[im 53/176]
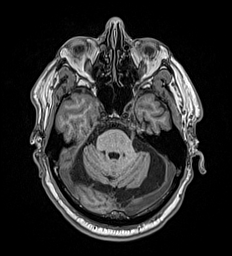
[im 71/176]
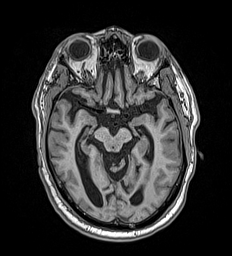
[im 88/176]
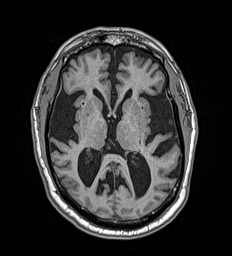
[im 106/176]
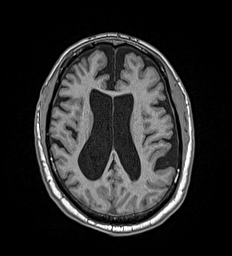
[im 123/176]
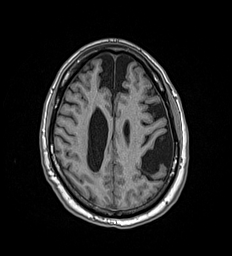
[im 141/176]
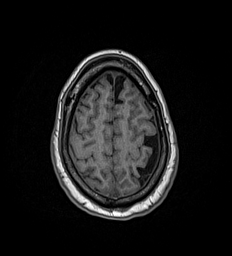
[im 158/176]
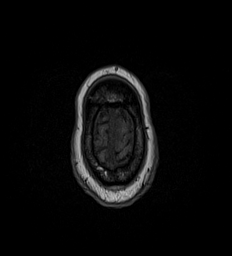
[im 176/176]
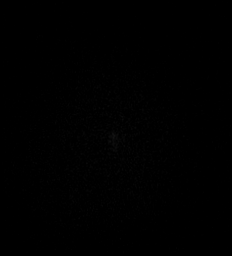

[Series 17: T2 · coronal · 5.0mm · 0.57mm/px · 2 of 29 slices shown (2 of 2)]
[im 1/29]
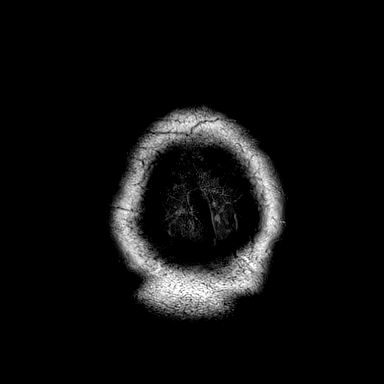
[im 29/29]
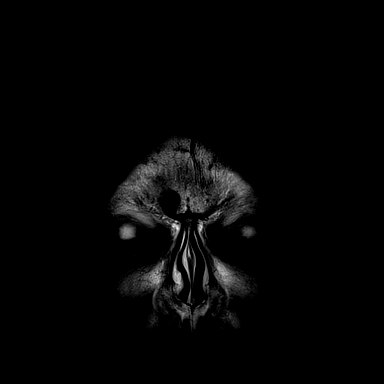

[48 of 48 positions shown; findings below may reference images not displayed]

FINDINGS: Brain: Moderate generalized atrophy is present. An arachnoid cyst is
present over the left convexity. Ventricles are enlarged
bilaterally. There is mild ventricular asymmetry, the right is
larger.

No acute infarct, hemorrhage, or mass lesion is present. No other
significant extra-axial collection is present.

The internal auditory canals are within normal limits. The brainstem
and cerebellum are within normal limits.

Vascular: Flow is present in the major intracranial arteries.

Skull and upper cervical spine: The craniocervical junction is
normal. Upper cervical spine is within normal limits. Marrow signal
is unremarkable.

Sinuses/Orbits: The paranasal sinuses and mastoid air cells are
clear. Bilateral lens replacements are noted. Globes and orbits are
otherwise unremarkable.
IMPRESSION: 1. No acute intracranial abnormality.
2. Moderate generalized atrophy and white matter disease likely
reflects the sequela of chronic microvascular ischemia.
3. Arachnoid cyst over the left convexity.

## 2021-08-07 ENCOUNTER — Encounter: Payer: Self-pay | Admitting: Physical Therapy

## 2021-08-07 ENCOUNTER — Other Ambulatory Visit: Payer: Self-pay

## 2021-08-07 ENCOUNTER — Ambulatory Visit: Payer: Medicare Other | Admitting: Physical Therapy

## 2021-08-07 DIAGNOSIS — R269 Unspecified abnormalities of gait and mobility: Secondary | ICD-10-CM

## 2021-08-07 DIAGNOSIS — R278 Other lack of coordination: Secondary | ICD-10-CM

## 2021-08-07 DIAGNOSIS — R2681 Unsteadiness on feet: Secondary | ICD-10-CM

## 2021-08-07 DIAGNOSIS — R2689 Other abnormalities of gait and mobility: Secondary | ICD-10-CM

## 2021-08-07 DIAGNOSIS — M6281 Muscle weakness (generalized): Secondary | ICD-10-CM

## 2021-08-07 DIAGNOSIS — R262 Difficulty in walking, not elsewhere classified: Secondary | ICD-10-CM

## 2021-08-07 NOTE — Therapy (Signed)
[place] MAIN [facility] SERVICES [address] [place], [place], [address] Phone: [phone]   Fax:  [phone]  Physical Therapy Treatment  Patient Details  Name: Tony Huffman MRN: [id number] Date of Birth: 1945-11-18 Referring Provider (PT): Dr. Joselyn Arrow   Encounter Date: 08/07/2021   PT End of Session - 08/07/21 1504     Visit Number 6    Number of Visits 25    Date for PT Re-Evaluation 10/13/21    Authorization Type Medicare Part B    Authorization Time Period 07/21/2021- 10/13/2021    Progress Note Due on Visit 10    PT Start Time 0815    PT Stop Time 0858    PT Time Calculation (min) 43 min    Equipment Utilized During Treatment Gait belt;Other (comment)   Left AFO   Activity Tolerance Patient tolerated treatment well    Behavior During Therapy [facility] for tasks assessed/performed             Past Medical History:  Diagnosis Date   Abnormal heart rhythm 10/20/2014   Addison's disease ([place])    Adrenal insufficiency ([place])    Allergy    Ambulates with cane    Anxiety    Arrhythmia    Cataract cortical, senile    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   Chronic kidney disease, stage 3 ([place]) 11/13/2011   Cough 03/29/2014   Edema leg 03/07/2017   Elevated cholesterol with high triglycerides 09/04/2013   Elevated red blood cell count    Erectile dysfunction    Falls frequently    Fever 03/29/2014   Generalized weakness 03/29/2014   GERD (gastroesophageal reflux disease)    Gout 09/04/2013   H/O adrenal insufficiency 05/17/2014   H/O atrial flutter 03/07/2017   H/O atrial flutter    H/O eye surgery    H/O sebaceous cyst 01/06/2018   H/O thyroid disease    H/O upper respiratory infection 10/15/2013   Hearing loss    History of back surgery    History of blepharoplasty    History of cancer 10/04/2015   History of chemotherapy    History of esophagogastroduodenoscopy (EGD)    History of prediabetes    Hyperlipidemia  06/16/2014   Hypertension    Hypertriglyceridemia    Hypothyroidism (acquired) 04/14/2014   Left leg swelling 11/23/2016   Low HDL (under 40)    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[place]) 11/10/2010   Malignant neoplasm of unspecified kidney, except renal pelvis (HCC)    OSA (obstructive sleep apnea) 07/24/2016   OSA (obstructive sleep apnea)    PAF (paroxysmal atrial fibrillation) (HCC)    PAF (paroxysmal atrial fibrillation) ([place])    Pancreatic mass 08/01/2018   Renal carcinoma ([place]) 06/16/2015   RLS (restless legs syndrome) 07/24/2016   Sleep apnea    Testicular hypofunction    Tubular adenoma 10/28/2017   Tussive syncopes 10/15/2013   Type 2 diabetes mellitus ([place])    Vitamin D deficiency disease     Past Surgical History:  Procedure Laterality Date   ADRENALECTOMY     CATARACT EXTRACTION  10/2014   CATARACT EXTRACTION EXTRACAPSULAR  11/05/2016   with Insertion Intraocular Prostheis.    COLONOSCOPY  05/06/2017   with removal lesions by snare.   COLONOSCOPY W/ BIOPSIES     05/06/2017   NEPHRECTOMY     POPLITEAL SYNOVIAL CYST EXCISION     SPINE SURGERY  1989   THYROIDECTOMY     THYROIDECTOMY, PARTIAL  TONSILLECTOMY      There were no vitals filed for this visit.   Subjective Assessment - 08/07/21 0820     Subjective Pt states he is doing well today. He reports mild pain in L calf due to a cramp he had in the night on Saturday. Denies falls. Is compliant with HEP. He noticed his balance was not good this morning.    Patient is accompained by: Family member   [name]   Pertinent History Patient is a 75 year old male with referral from Neurology- Dr. Joselyn Arrow for unspecified abnormality of gait. He presents today with his wife and reports > 6 month history of progressive issues with poor balance and multiple falls. Patient has renal Cancer and currently has Pallative care- Was stabilized out of Hospice. Patient presents with the following past medical history: A-Fib  and flutter, Lung Cancer, Obstructive sleep apnea, Upper respiratory infection, Addison's disease, Hypothyroidism, Renal Cancer, DM, gout, arthritis, Chronic kidney disease and Restless leg syndrome. Patient lives with wife in independent living at [facility]. Prior level of function was independent with transfers and mobility using walker yet some assist with dressing.    Limitations Lifting;Standing;Walking;House hold activities    How long can you sit comfortably? No issues    How long can you stand comfortably? <5 min    How long can you walk comfortably? <5 min    Diagnostic tests EXAM:  MRI HEAD WITHOUT CONTRAST     TECHNIQUE:  Multiplanar, multiecho pulse sequences of the brain and surrounding  structures were obtained without intravenous contrast.     COMPARISON:  CT head without contrast 01/20/2021     FINDINGS:  Brain: Moderate generalized atrophy is present. 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 Ventricles are enlarged  bilaterally. There is mild ventricular asymmetry, the right is  larger.     No acute infarct, hemorrhage, or mass lesion is present. No other  significant extra-axial collection is present.     The internal auditory canals are within normal limits. The brainstem  and cerebellum are within normal limits.     Vascular: Flow is present in the major intracranial arteries.     Skull and upper cervical spine: The craniocervical junction is  normal. Upper cervical spine is within normal limits. Marrow signal  is unremarkable.     Sinuses/Orbits: The paranasal sinuses and mastoid air cells are  clear. Bilateral lens replacements are noted. Globes and orbits are  otherwise unremarkable.     IMPRESSION:  1. No acute intracranial abnormality.  2. Moderate generalized atrophy and white matter disease likely  reflects the sequela of chronic microvascular ischemia.  3. Arachnoid cyst over the left convexity.    Patient Stated Goals I would like to walk better and not fall     Currently in Pain? Yes    Pain Score 2     Pain Location Calf    Pain Onset More than a month ago    Pain Onset More than a month ago               INTERVENTIONS   Gait Training:  Knee drive with posterior swing through to extension (toe off phase), 3x12 each side; VC for consistent extension and power.    68x160' with 4WW, pt wearing AFO - following knee drive exercise for carry over in gait sequencing. Good L foot clearance during swing phase during initial 40ft. Overall technique and knee drive diminished with  fatigue leading to LLE dragging behind and entering significant toe out. CGA provided throughout for safety. -Decreased LLE clearance also noted with turning and obstacles.     There.ex:    Standing at support bar, BUE support: Lateral stepping over 6" hurdle, 2x10 BLE. VC on hip flexion rather than knee flexion to clear hurdle on LLE.   Neuro Re-Ed:  Standing on blue airex pad at support bar: Alternating toe taps to 4" step, encouraged to remove UE support (progressed down to 2 fingers of 1 hand). 2x20 taps each side.  Romberg stance, x45 seconds  Standing one foot on airex and one foot on 6" step, SUE support. 2x45 seconds each. Multimodal cuing for upright posture.    Multiple STS throughout session for seated rest breaks between standing exercises.          Clinical Impression: Pt demonstrates excellent motivation through PT session. Pt demo increased left external rotation and decreased foot clearance as he walked into the gym. Therex and neuro re-ed interventions were continued from last session. Pt demo good carry over from knee drive activity during over-ground gait training with improved hip flexion and foot clearance through the initial 51ft; clearance decreased as fatigue set in. He does respond well to PT VC to "pick up left foot". Partial correction of external rotation with focus on knee drive and foot clearance. PT did notice increased dragging of LLE  during turns while ambulating around corners. Pt will continue to benefit from skilled PT to progress balance, strength, ambulatory capacity and gait mechanics to optimize independence with ADLs.            PT Short Term Goals - 07/22/21 0949       PT SHORT TERM GOAL #1   Title Pt will be independent with HEP in order to improve strength and balance in order to decrease fall risk and improve function at home and work.    Baseline 07/21/2021- Paitent presents with no formal HEP in place    Time 6    Period Weeks    Status New    Target Date 09/01/21               PT Long Term Goals - 07/22/21 0950       PT LONG TERM GOAL #1   Title Pt will improve FOTO to target score of 53  to display perceived improvements in ability to complete ADL's.    Baseline 07/21/2021= 45    Time 12    Period Weeks    Status New    Target Date 10/13/21      PT LONG TERM GOAL #2   Title Pt will improve BERG by at least 5 points in order to demonstrate clinically significant improvement in balance.    Baseline 07/21/2021= 39/56    Time 12    Period Weeks    Status New    Target Date 10/13/21      PT LONG TERM GOAL #3   Title Pt will decrease 5TSTS by at least 5 seconds in order to demonstrate clinically significant improvement in LE strength.    Baseline 07/21/2021= 21 sec without UE Support    Time 12    Period Weeks    Status New    Target Date 10/13/21      PT LONG TERM GOAL #4   Title Pt will decrease TUG to below  18 seconds/decrease in order to demonstrate decreased fall risk.    Baseline 07/21/2021= 23  sec with 4WW    Time 12    Period Weeks    Status New    Target Date 10/13/21      PT LONG TERM GOAL #5   Title Pt will increase 10MWT by at least 0.23 m/s in order to demonstrate clinically significant improvement in community ambulation.    Baseline 07/21/2021= 0.42m/s    Time 12    Period Weeks    Status New    Target Date 10/13/21                   Plan -  08/07/21 1504     Clinical Impression Statement Pt demonstrates excellent motivation through PT session. Pt demo increased left external rotation and decreased foot clearance as he walked into the gym. Therex and neuro re-ed interventions were continued from last session. Pt demo good carry over from knee drive activity during over-ground gait training with improved hip flexion and foot clearance through the initial 20ft; clearance decreased as fatigue set in. He does respond well to PT VC to "pick up left foot". Partial correction of external rotation with focus on knee drive and foot clearance. PT did notice increased dragging of LLE during turns while ambulating around corners. Pt will continue to benefit from skilled PT to progress balance, strength, ambulatory capacity and gait mechanics to optimize independence with ADLs.    Personal Factors and Comorbidities Comorbidity 3+    Comorbidities A- Fib, Renal cancer, Addison's disease, gout, arthritis, DM    Examination-Activity Limitations Caring for Others;Carry;Dressing;Lift;Reach Overhead;Squat;Stairs;Transfers;Stand    Examination-Participation Restrictions Investment banker, operational;Yard Work;Shop    Stability/Clinical Decision Making Evolving/Moderate complexity    Rehab Potential Good    PT Frequency 2x / week    PT Duration 12 weeks    PT Treatment/Interventions ADLs/Self Care Home Management;Canalith Repostioning;Cryotherapy;Electrical Stimulation;Moist Heat;Traction;Ultrasound;DME Instruction;Gait training;Stair training;Functional mobility training;Therapeutic activities;Therapeutic exercise;Balance training;Neuromuscular re-education;Patient/family education;Orthotic Fit/Training;Wheelchair mobility training;Manual techniques;Passive range of motion;Dry needling;Energy conservation;Taping;Vestibular    PT Next Visit Plan Upright therex to improve tolerance for standing/walking ADL's    PT Home Exercise Plan To be initiated next  1-2 visits    Consulted and Agree with Plan of Care Patient;Family member/caregiver    Family Member Consulted WIfe- [name]             Patient will benefit from skilled therapeutic intervention in order to improve the following deficits and impairments:  Abnormal gait, Decreased activity tolerance, Decreased balance, Decreased coordination, Decreased endurance, Decreased knowledge of use of DME, Decreased mobility, Decreased strength, Difficulty walking, Hypomobility, Impaired sensation, Impaired vision/preception, Postural dysfunction, Pain  Visit Diagnosis: Abnormality of gait and mobility  Difficulty in walking, not elsewhere classified  Muscle weakness (generalized)  Other abnormalities of gait and mobility  Other lack of coordination  Unsteadiness on feet     Problem List Patient Active Problem List   Diagnosis Date Noted   Addisons disease ([place]) 03/16/2021   Low HDL (under 40) 03/16/2021   Difficulty sleeping 11/21/2020   Numbness and tingling 11/21/2020   Difficulty walking 01/14/2020   History of recent fall 01/14/2020   Sleep disorder 01/14/2020   Pancreatic mass 08/01/2018   H/O sebaceous cyst 01/06/2018   Tubular adenoma 10/28/2017   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   Edema leg 03/07/2017   Hx of atrial flutter 03/07/2017   OSA (obstructive sleep apnea) 07/24/2016   RLS (restless legs syndrome) 07/24/2016   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 [place], [place], [address] Phone: [phone]   Fax:  [phone]  Name: Tony Huffman MRN: [id number] Date of Birth: 06-18-1946

## 2021-08-10 ENCOUNTER — Encounter: Payer: Self-pay | Admitting: Podiatry

## 2021-08-10 ENCOUNTER — Ambulatory Visit: Payer: Medicare Other

## 2021-08-10 ENCOUNTER — Other Ambulatory Visit: Payer: Self-pay

## 2021-08-10 ENCOUNTER — Ambulatory Visit (INDEPENDENT_AMBULATORY_CARE_PROVIDER_SITE_OTHER): Payer: Medicare Other | Admitting: Podiatry

## 2021-08-10 DIAGNOSIS — R269 Unspecified abnormalities of gait and mobility: Secondary | ICD-10-CM

## 2021-08-10 DIAGNOSIS — R2681 Unsteadiness on feet: Secondary | ICD-10-CM

## 2021-08-10 DIAGNOSIS — M7662 Achilles tendinitis, left leg: Secondary | ICD-10-CM

## 2021-08-10 DIAGNOSIS — M6281 Muscle weakness (generalized): Secondary | ICD-10-CM

## 2021-08-10 DIAGNOSIS — R262 Difficulty in walking, not elsewhere classified: Secondary | ICD-10-CM

## 2021-08-10 MED ORDER — LIDOCAINE 5 % EX OINT: 1.0000 "application " | TOPICAL_OINTMENT | CUTANEOUS | 0 refills | Status: DC | PRN

## 2021-08-10 NOTE — Therapy (Signed)
[place] [facility] SERVICES [address] [place], [place], [address] Phone: [phone]   Fax:  [phone]  Physical Therapy Treatment  Patient Details  Name: Tony Huffman MRN: [id number] Date of Birth: 01-04-1946 Referring Provider (PT): Dr. Joselyn Arrow   Encounter Date: 08/10/2021   PT End of Session - 08/10/21 0801     Visit Number 7    Number of Visits 25    Date for PT Re-Evaluation 10/13/21    Authorization Type Medicare Part B    Authorization Time Period 07/21/2021- 10/13/2021    Progress Note Due on Visit 10    PT Start Time 0800    PT Stop Time 0844    PT Time Calculation (min) 44 min    Equipment Utilized During Treatment Gait belt;Other (comment)   Left AFO   Activity Tolerance Patient tolerated treatment well    Behavior During Therapy [facility] for tasks assessed/performed             Past Medical History:  Diagnosis Date   Abnormal heart rhythm 10/20/2014   Addison's disease ([place])    Adrenal insufficiency ([place])    Allergy    Ambulates with cane    Anxiety    Arrhythmia    Cataract cortical, senile    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   Chronic kidney disease, stage 3 ([place]) 11/13/2011   Cough 03/29/2014   Edema leg 03/07/2017   Elevated cholesterol with high triglycerides 09/04/2013   Elevated red blood cell count    Erectile dysfunction    Falls frequently    Fever 03/29/2014   Generalized weakness 03/29/2014   GERD (gastroesophageal reflux disease)    Gout 09/04/2013   H/O adrenal insufficiency 05/17/2014   H/O atrial flutter 03/07/2017   H/O atrial flutter    H/O eye surgery    H/O sebaceous cyst 01/06/2018   H/O thyroid disease    H/O upper respiratory infection 10/15/2013   Hearing loss    History of back surgery    History of blepharoplasty    History of cancer 10/04/2015   History of chemotherapy    History of esophagogastroduodenoscopy (EGD)    History of prediabetes    Hyperlipidemia  06/16/2014   Hypertension    Hypertriglyceridemia    Hypothyroidism (acquired) 04/14/2014   Left leg swelling 11/23/2016   Low HDL (under 40)    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[place]) 11/10/2010   Malignant neoplasm of unspecified kidney, except renal pelvis (HCC)    OSA (obstructive sleep apnea) 07/24/2016   OSA (obstructive sleep apnea)    PAF (paroxysmal atrial fibrillation) (HCC)    PAF (paroxysmal atrial fibrillation) ([place])    Pancreatic mass 08/01/2018   Renal carcinoma ([place]) 06/16/2015   RLS (restless legs syndrome) 07/24/2016   Sleep apnea    Testicular hypofunction    Tubular adenoma 10/28/2017   Tussive syncopes 10/15/2013   Type 2 diabetes mellitus ([place])    Vitamin D deficiency disease     Past Surgical History:  Procedure Laterality Date   ADRENALECTOMY     CATARACT EXTRACTION  10/2014   CATARACT EXTRACTION EXTRACAPSULAR  11/05/2016   with Insertion Intraocular Prostheis.    COLONOSCOPY  05/06/2017   with removal lesions by snare.   COLONOSCOPY W/ BIOPSIES     05/06/2017   NEPHRECTOMY     POPLITEAL SYNOVIAL CYST EXCISION     SPINE SURGERY  1989   THYROIDECTOMY     THYROIDECTOMY, PARTIAL  TONSILLECTOMY      There were no vitals filed for this visit.   Subjective Assessment - 08/10/21 0759     Subjective Patient reports incresaed left posterior heel pain after last session and states he has  history of achilles tendonitis. He reports his heel is too sore to wear the AFO.    Patient is accompained by: Family member   [name]   Pertinent History Patient is a 75 year old male with referral from Neurology- Dr. Joselyn Arrow for unspecified abnormality of gait. He presents today with his wife and reports > 6 month history of progressive issues with poor balance and multiple falls. Patient has renal Cancer and currently has Pallative care- Was stabilized out of Hospice. Patient presents with the following past medical history: A-Fib and flutter, Lung Cancer,  Obstructive sleep apnea, Upper respiratory infection, Addison's disease, Hypothyroidism, Renal Cancer, DM, gout, arthritis, Chronic kidney disease and Restless leg syndrome. Patient lives with wife in independent living at [facility]. Prior level of function was independent with transfers and mobility using walker yet some assist with dressing.    Limitations Lifting;Standing;Walking;House hold activities    How long can you sit comfortably? No issues    How long can you stand comfortably? <5 min    How long can you walk comfortably? <5 min    Diagnostic tests EXAM:  MRI HEAD WITHOUT CONTRAST     TECHNIQUE:  Multiplanar, multiecho pulse sequences of the brain and surrounding  structures were obtained without intravenous contrast.     COMPARISON:  CT head without contrast 01/20/2021     FINDINGS:  Brain: Moderate generalized atrophy is present. 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 Ventricles are enlarged  bilaterally. There is mild ventricular asymmetry, the right is  larger.     No acute infarct, hemorrhage, or mass lesion is present. No other  significant extra-axial collection is present.     The internal auditory canals are within normal limits. The brainstem  and cerebellum are within normal limits.     Vascular: Flow is present in the major intracranial arteries.     Skull and upper cervical spine: The craniocervical junction is  normal. Upper cervical spine is within normal limits. Marrow signal  is unremarkable.     Sinuses/Orbits: The paranasal sinuses and mastoid air cells are  clear. Bilateral lens replacements are noted. Globes and orbits are  otherwise unremarkable.     IMPRESSION:  1. No acute intracranial abnormality.  2. Moderate generalized atrophy and white matter disease likely  reflects the sequela of chronic microvascular ischemia.  3. Arachnoid cyst over the left convexity.    Patient Stated Goals I would like to walk better and not fall    Currently in Pain? Yes     Pain Score 8     Pain Location Heel    Pain Orientation Left;Posterior;Distal    Pain Descriptors / Indicators Constant;Nagging;Sore    Pain Type Acute pain    Pain Onset In the past 7 days    Pain Frequency Constant    Aggravating Factors  weightbearing; contact with shoes    Pain Relieving Factors rest; not wearing Brace    Effect of Pain on Daily Activities Difficulty walking    Multiple Pain Sites No    Pain Onset --             Interventions:  --Limited to seated non-weight bearing exercises due to increased Left posterior heel  pain.   Observed left heel - redness along distal - posterior and medial border along calcaneal tuberosity- tender to touch along calcaneal tuberosity. Able to achieve active DF/PF- with increased pain.   Recommended patient to call Podiatrist and follow up for further evaluation. Requested that he bring his brace to allow MD to see if rubbing may be contributing to redness.   Therex:   Seated hip march with 4lb 2 sets of 15 reps Seated knee ext ( 4lb on right LE and RTB for left LE) 2 sets of 15 reps Seated hip flex/abd (up and over hedgehog) (4lb on right LE and RTB for left LE) 2 sets of 15 reps  Seated hip add squeeze with red soft ball x 5 sec hold x 15 reps Seated Resistive hip abd with RTB 2 sets of 15 reps.  Patient provided brief rest break between each exercise.  Education provided throughout session via VC/TC and demonstration to facilitate movement at target joints and correct muscle activation for all testing and exercises performed.   Clinical Impression: Treatment limited today secondary to patient complaining of increased pain posterior left heel. All walking and weight bearing activities deferred and patient was recommended to call his podiatrist that he has seen in recent past for achilles tendonitis to try to obtain appoint for further evaluation. Patient did respond well to seated LE strengthening without increased pain today. Pt  will continue to benefit from skilled PT to progress balance, strength, ambulatory capacity and gait mechanics to optimize independence with ADLs.                           PT Education - 08/10/21 0801     Education Details Exercise technique    Person(s) Educated Patient    Methods Explanation;Demonstration;Verbal cues;Tactile cues    Comprehension Verbalized understanding;Returned demonstration;Verbal cues required;Need further instruction;Tactile cues required              PT Short Term Goals - 07/22/21 0949       PT SHORT TERM GOAL #1   Title Pt will be independent with HEP in order to improve strength and balance in order to decrease fall risk and improve function at home and work.    Baseline 07/21/2021- Paitent presents with no formal HEP in place    Time 6    Period Weeks    Status New    Target Date 09/01/21               PT Long Term Goals - 07/22/21 0950       PT LONG TERM GOAL #1   Title Pt will improve FOTO to target score of 53  to display perceived improvements in ability to complete ADL's.    Baseline 07/21/2021= 45    Time 12    Period Weeks    Status New    Target Date 10/13/21      PT LONG TERM GOAL #2   Title Pt will improve BERG by at least 5 points in order to demonstrate clinically significant improvement in balance.    Baseline 07/21/2021= 39/56    Time 12    Period Weeks    Status New    Target Date 10/13/21      PT LONG TERM GOAL #3   Title Pt will decrease 5TSTS by at least 5 seconds in order to demonstrate clinically significant improvement in LE strength.    Baseline 07/21/2021= 21 sec  without UE Support    Time 12    Period Weeks    Status New    Target Date 10/13/21      PT LONG TERM GOAL #4   Title Pt will decrease TUG to below  18 seconds/decrease in order to demonstrate decreased fall risk.    Baseline 07/21/2021= 23 sec with 4WW    Time 12    Period Weeks    Status New    Target Date 10/13/21       PT LONG TERM GOAL #5   Title Pt will increase 10MWT by at least 0.23 m/s in order to demonstrate clinically significant improvement in community ambulation.    Baseline 07/21/2021= 0.4m/s    Time 12    Period Weeks    Status New    Target Date 10/13/21                   Plan - 08/10/21 1636     Clinical Impression Statement Treatment limited today secondary to patient complaining of increased pain posterior left heel. All walking and weight bearing activities deferred and patient was recommended to call his podiatrist that he has seen in recent past for achilles tendonitis to try to obtain appoint for further evaluation. Patient did respond well to seated LE strengthening without increased pain today. Pt will continue to benefit from skilled PT to progress balance, strength, ambulatory capacity and gait mechanics to optimize independence with ADLs.    Personal Factors and Comorbidities Comorbidity 3+    Comorbidities A- Fib, Renal cancer, Addison's disease, gout, arthritis, DM    Examination-Activity Limitations Caring for Others;Carry;Dressing;Lift;Reach Overhead;Squat;Stairs;Transfers;Stand    Examination-Participation Restrictions Investment banker, operational;Yard Work;Shop    Stability/Clinical Decision Making Evolving/Moderate complexity    Rehab Potential Good    PT Frequency 2x / week    PT Duration 12 weeks    PT Treatment/Interventions ADLs/Self Care Home Management;Canalith Repostioning;Cryotherapy;Electrical Stimulation;Moist Heat;Traction;Ultrasound;DME Instruction;Gait training;Stair training;Functional mobility training;Therapeutic activities;Therapeutic exercise;Balance training;Neuromuscular re-education;Patient/family education;Orthotic Fit/Training;Wheelchair mobility training;Manual techniques;Passive range of motion;Dry needling;Energy conservation;Taping;Vestibular    PT Next Visit Plan Upright therex to improve tolerance for standing/walking ADL's     PT Home Exercise Plan No updates today    Consulted and Agree with Plan of Care Patient;Family member/caregiver    Family Member Consulted WIfe- [name]             Patient will benefit from skilled therapeutic intervention in order to improve the following deficits and impairments:  Abnormal gait, Decreased activity tolerance, Decreased balance, Decreased coordination, Decreased endurance, Decreased knowledge of use of DME, Decreased mobility, Decreased strength, Difficulty walking, Hypomobility, Impaired sensation, Impaired vision/preception, Postural dysfunction, Pain  Visit Diagnosis: Abnormality of gait and mobility  Difficulty in walking, not elsewhere classified  Muscle weakness (generalized)  Unsteadiness on feet     Problem List Patient Active Problem List   Diagnosis Date Noted   Addisons disease ([place]) 03/16/2021   Low HDL (under 40) 03/16/2021   Difficulty sleeping 11/21/2020   Numbness and tingling 11/21/2020   Difficulty walking 01/14/2020   History of recent fall 01/14/2020   Sleep disorder 01/14/2020   Pancreatic mass 08/01/2018   H/O sebaceous cyst 01/06/2018   Tubular adenoma 10/28/2017   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   Edema leg 03/07/2017   Hx of atrial flutter 03/07/2017   OSA (obstructive sleep apnea) 07/24/2016   RLS (restless legs syndrome) 07/24/2016   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   Atrial fibrillation and flutter (  [place]) 10/20/2014   Hyperlipidemia 06/16/2014   Adrenal crisis ([place]) 04/19/2014   Cough 03/29/2014   Weakness 03/29/2014   Cough syncope 10/15/2013   URI (upper respiratory infection) 10/15/2013   Dyslipidemia 09/04/2013   Gout 09/04/2013   Chronic kidney disease, stage III (moderate) ([place]) 11/13/2011   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   Hypothyroidism (acquired) 11/30/2010   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[place]) 11/10/2010    Lewis Moccasin, PT 08/10/2021, 4:52 PM  [place] MAIN [facility] SERVICES [address] [place], [place], [address] Phone: [phone]   Fax:  [phone]  Name: Tony Huffman MRN: [id number] Date of Birth: 16-Sep-1946

## 2021-08-10 NOTE — Progress Notes (Signed)
Subjective:  Patient ID: Tony Huffman, male    DOB: [birth year],  MRN: [id number]  Chief Complaint  Patient presents with   Foot Pain    Left foot pain     75 y.o. male presents with the above complaint.  Patient presents with a follow-up to left posterior heel pain.  Patient states this started getting aggravated again.  He has been in a lot of physical therapy that could have led to rubbing and pain.  He would like to discuss treatment options.  He just had a steroid injection done about 6 months ago.  He has been wearing his boot with bracing.  He denies any other acute complaints.  He has kidney disease as well as adrenal gland problem and is on chronic steroid medication.   Review of Systems: Negative except as noted in the HPI. Denies N/V/F/Ch.  Past Medical History:  Diagnosis Date   Abnormal heart rhythm 10/20/2014   Addison's disease ([place])    Adrenal insufficiency (HCC)    Allergy    Ambulates with cane    Anxiety    Arrhythmia    Cataract cortical, senile    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   Chronic kidney disease, stage 3 ([place]) 11/13/2011   Cough 03/29/2014   Edema leg 03/07/2017   Elevated cholesterol with high triglycerides 09/04/2013   Elevated red blood cell count    Erectile dysfunction    Falls frequently    Fever 03/29/2014   Generalized weakness 03/29/2014   GERD (gastroesophageal reflux disease)    Gout 09/04/2013   H/O adrenal insufficiency 05/17/2014   H/O atrial flutter 03/07/2017   H/O atrial flutter    H/O eye surgery    H/O sebaceous cyst 01/06/2018   H/O thyroid disease    H/O upper respiratory infection 10/15/2013   Hearing loss    History of back surgery    History of blepharoplasty    History of cancer 10/04/2015   History of chemotherapy    History of esophagogastroduodenoscopy (EGD)    History of prediabetes    Hyperlipidemia 06/16/2014   Hypertension    Hypertriglyceridemia    Hypothyroidism (acquired) 04/14/2014   Left leg  swelling 11/23/2016   Low HDL (under 40)    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[place]) 11/10/2010   Malignant neoplasm of unspecified kidney, except renal pelvis (HCC)    OSA (obstructive sleep apnea) 07/24/2016   OSA (obstructive sleep apnea)    PAF (paroxysmal atrial fibrillation) (HCC)    PAF (paroxysmal atrial fibrillation) ([place])    Pancreatic mass 08/01/2018   Renal carcinoma ([place]) 06/16/2015   RLS (restless legs syndrome) 07/24/2016   Sleep apnea    Testicular hypofunction    Tubular adenoma 10/28/2017   Tussive syncopes 10/15/2013   Type 2 diabetes mellitus ([place])    Vitamin D deficiency disease     Current Outpatient Medications:    lidocaine (XYLOCAINE) 5 % ointment, Apply 1 application topically as needed., Disp: 35.44 g, Rfl: 0   Accu-Chek Softclix Lancets lancets, Use to check blood sugar once daily. Dx: E11.49, Disp: 100 each, Rfl: 3   blood glucose meter kit and supplies, 1 each by Other route as directed. Dispense based on patient and insurance preference. Use up to four times daily as directed. (FOR ICD-10 E10.9, E11.9)., Disp: , Rfl:    Cholecalciferol 125 MCG (5000 UT) capsule, Take 5,000 Units by mouth daily., Disp: , Rfl:    colchicine 0.6 MG tablet, Take 1 tablet (0.6 mg  total) by mouth as needed. First sign of Gout Flare. Take 2 tablets ( 1.53m) by mouth at first sign of gout flare followed by 1 tablet ( 0.655m after 1 hour. ( max 1.82m56mithin 1 hours), Disp: 180 tablet, Rfl: 1   cyanocobalamin 1000 MCG tablet, Take 1,000 mcg by mouth every other day., Disp: , Rfl:    Fludrocortisone Acetate (FLORINEF PO), Take 0.1 mg by mouth every other day. On alternate days take 1/2 tablet., Disp: , Rfl:    furosemide (LASIX) 20 MG tablet, Take 20 mg by mouth as needed. For Edema., Disp: , Rfl:    gabapentin (NEURONTIN) 600 MG tablet, Take 900 mg by mouth daily. Takes 1 and 1/2 tablet by mouth nightly., Disp: , Rfl:    glucose blood (ACCU-CHEK AVIVA PLUS) test strip, Use to test  blood sugar once daily. Dx: E11.49, Disp: 100 each, Rfl: 3   hydrocortisone (CORTEF) 10 MG tablet, Take 10 mg by mouth daily. Takes 1 and 1/2 tablet every morning, 1/2 tablet every afternoon, and 1/2 tablet every evening. Take double dose as directed for stress, may take up to 85 tablets monthly., Disp: , Rfl:    levothyroxine (SYNTHROID) 75 MCG tablet, Take 75 mcg by mouth daily., Disp: , Rfl:    metFORMIN (GLUMETZA) 500 MG (MOD) 24 hr tablet, Take 500 mg by mouth daily., Disp: , Rfl:    Multiple Vitamins-Minerals (PRESERVISION AREDS 2 PO), Take 1 capsule by mouth in the morning and at bedtime., Disp: , Rfl:    nortriptyline (PAMELOR) 10 MG capsule, 2 nightly, Disp: , Rfl:    predniSONE (STERAPRED UNI-PAK 21 TAB) 10 MG (21) TBPK tablet, Use as directed, Disp: 21 tablet, Rfl: 0   PSYLLIUM HUSK PO, Take 3.4 g by mouth every evening., Disp: , Rfl:   Social History   Tobacco Use  Smoking Status Former   Packs/day: 1.00   Years: 10.00   Pack years: 10.00   Types: Cigarettes   Quit date: 1978   Years since quitting: 44.8  Smokeless Tobacco Never    Allergies  Allergen Reactions   Other Rash    Blisters with bandaids Blisters with bandaids    Soap Itching    Any Deodorant Soap.   Tegaderm Ag Mesh [Silver]    Tape Rash    Blisters, rash. Paper tape OK. Blisters, rash. Paper tape OK.    Objective:  There were no vitals filed for this visit. There is no height or weight on file to calculate BMI. Constitutional Well developed. Well nourished.  Vascular Dorsalis pedis pulses palpable bilaterally. Posterior tibial pulses palpable bilaterally. Capillary refill normal to all digits.  No cyanosis or clubbing noted. Pedal hair growth normal.  Neurologic Normal speech. Oriented to person, place, and time. Epicritic sensation to light touch grossly present bilaterally.  Dermatologic Nails well groomed and normal in appearance. No open wounds. No skin lesions.  Orthopedic: Pain on  palpation left posterior Achilles tendon insertion.  Normal no pain along the course of the Achilles tendon pain with dorsiflexion of the ankle joint no pain with plantarflexion of the ankle joint.  No pain at the posterior tibial tendon, peroneal tendon, ATFL ligament.  No deep ankle intra-articular pain noted.  Positive Silfverskiold test with gastrocnemius equinus   Radiographs: 3 views of skeletally mature adult left foot: Posterior heel spurring noted plantar spurring noted no other bony abnormalities identified. Assessment:   1. Achilles tendinitis of left lower extremity     Plan:  Patient was evaluated and treated and all questions answered.  Left Achilles tendinitis with posterior spurring -I explained to the patient the etiology of Achilles tendinitis and various treatment options were discussed.  -I will hold off on placing him or giving him a steroid injection.  At this time I discussed with the patient that I would prefer to do steroid injection in the Achilles once a year if possible.  There is a risk of rupture associated with it. -Another cam boot was dispensed as he lost the previous one.  He will place himself in the boot for next 4 weeks. -Lidocaine jelly was sent to the pharmacy to give local pain relief.  No follow-ups on file.

## 2021-08-14 ENCOUNTER — Other Ambulatory Visit: Payer: Self-pay

## 2021-08-14 ENCOUNTER — Ambulatory Visit: Payer: Medicare Other

## 2021-08-14 DIAGNOSIS — M6281 Muscle weakness (generalized): Secondary | ICD-10-CM

## 2021-08-14 DIAGNOSIS — R2689 Other abnormalities of gait and mobility: Secondary | ICD-10-CM

## 2021-08-14 DIAGNOSIS — R2681 Unsteadiness on feet: Secondary | ICD-10-CM

## 2021-08-14 DIAGNOSIS — R278 Other lack of coordination: Secondary | ICD-10-CM

## 2021-08-14 DIAGNOSIS — R269 Unspecified abnormalities of gait and mobility: Secondary | ICD-10-CM | POA: Diagnosis not present

## 2021-08-14 LAB — BASIC METABOLIC PANEL
BUN: 18 (ref 4–21)
CO2: 29 — AB (ref 13–22)
Chloride: 103 (ref 99–108)
Creatinine: 1.1 (ref 0.6–1.3)
Glucose: 134
Potassium: 4.8 (ref 3.4–5.3)
Sodium: 139 (ref 137–147)

## 2021-08-14 LAB — TSH: TSH: 3.34 (ref 0.41–5.90)

## 2021-08-14 LAB — COMPREHENSIVE METABOLIC PANEL: Calcium: 9.1 (ref 8.7–10.7)

## 2021-08-14 LAB — HEMOGLOBIN A1C: Hemoglobin A1C: 7.2

## 2021-08-14 NOTE — Therapy (Signed)
[place] MAIN [facility] SERVICES [address] [place], [place], [address] Phone: [phone]   Fax:  [phone]  Physical Therapy Treatment  Patient Details  Name: Tony Huffman MRN: [id number] Date of Birth: June 16, 1946 Referring Provider (PT): Dr. Joselyn Arrow   Encounter Date: 08/14/2021   PT End of Session - 08/14/21 0852     Visit Number 8    Number of Visits 25    Date for PT Re-Evaluation 10/13/21    Authorization Type Medicare Part B    Authorization Time Period 07/21/2021- 10/13/2021    Progress Note Due on Visit 10    PT Start Time 0808    PT Stop Time 0846    PT Time Calculation (min) 38 min    Equipment Utilized During Treatment Gait belt;Other (comment)   Left AFO   Activity Tolerance Patient tolerated treatment well    Behavior During Therapy [facility] for tasks assessed/performed             Past Medical History:  Diagnosis Date   Abnormal heart rhythm 10/20/2014   Addison's disease ([place])    Adrenal insufficiency ([place])    Allergy    Ambulates with cane    Anxiety    Arrhythmia    Cataract cortical, senile    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   Chronic kidney disease, stage 3 ([place]) 11/13/2011   Cough 03/29/2014   Edema leg 03/07/2017   Elevated cholesterol with high triglycerides 09/04/2013   Elevated red blood cell count    Erectile dysfunction    Falls frequently    Fever 03/29/2014   Generalized weakness 03/29/2014   GERD (gastroesophageal reflux disease)    Gout 09/04/2013   H/O adrenal insufficiency 05/17/2014   H/O atrial flutter 03/07/2017   H/O atrial flutter    H/O eye surgery    H/O sebaceous cyst 01/06/2018   H/O thyroid disease    H/O upper respiratory infection 10/15/2013   Hearing loss    History of back surgery    History of blepharoplasty    History of cancer 10/04/2015   History of chemotherapy    History of esophagogastroduodenoscopy (EGD)    History of prediabetes    Hyperlipidemia  06/16/2014   Hypertension    Hypertriglyceridemia    Hypothyroidism (acquired) 04/14/2014   Left leg swelling 11/23/2016   Low HDL (under 40)    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[place]) 11/10/2010   Malignant neoplasm of unspecified kidney, except renal pelvis (HCC)    OSA (obstructive sleep apnea) 07/24/2016   OSA (obstructive sleep apnea)    PAF (paroxysmal atrial fibrillation) (HCC)    PAF (paroxysmal atrial fibrillation) ([place])    Pancreatic mass 08/01/2018   Renal carcinoma ([place]) 06/16/2015   RLS (restless legs syndrome) 07/24/2016   Sleep apnea    Testicular hypofunction    Tubular adenoma 10/28/2017   Tussive syncopes 10/15/2013   Type 2 diabetes mellitus ([place])    Vitamin D deficiency disease     Past Surgical History:  Procedure Laterality Date   ADRENALECTOMY     CATARACT EXTRACTION  10/2014   CATARACT EXTRACTION EXTRACAPSULAR  11/05/2016   with Insertion Intraocular Prostheis.    COLONOSCOPY  05/06/2017   with removal lesions by snare.   COLONOSCOPY W/ BIOPSIES     05/06/2017   NEPHRECTOMY     POPLITEAL SYNOVIAL CYST EXCISION     SPINE SURGERY  1989   THYROIDECTOMY     THYROIDECTOMY, PARTIAL  TONSILLECTOMY      There were no vitals filed for this visit.   Subjective Assessment - 08/14/21 0806     Subjective Pt reports no pain currently, and that L heel feels better. Pt is wearing boot on L foot after attending foot/ankle clinic. Pt reports bone spur as cause of pain last appointment.    Patient is accompained by: Family member   wife - Tony Huffman   Pertinent History Patient is a 75 year old male with referral from Neurology- Dr. Joselyn Arrow for unspecified abnormality of gait. He presents today with his wife and reports > 6 month history of progressive issues with poor balance and multiple falls. Patient has renal Cancer and currently has Pallative care- Was stabilized out of Hospice. Patient presents with the following past medical history: A-Fib and flutter, Lung  Cancer, Obstructive sleep apnea, Upper respiratory infection, Addison's disease, Hypothyroidism, Renal Cancer, DM, gout, arthritis, Chronic kidney disease and Restless leg syndrome. Patient lives with wife in independent living at [facility]. Prior level of function was independent with transfers and mobility using walker yet some assist with dressing.    Limitations Lifting;Standing;Walking;House hold activities    How long can you sit comfortably? No issues    How long can you stand comfortably? <5 min    How long can you walk comfortably? <5 min    Diagnostic tests EXAM:  MRI HEAD WITHOUT CONTRAST     TECHNIQUE:  Multiplanar, multiecho pulse sequences of the brain and surrounding  structures were obtained without intravenous contrast.     COMPARISON:  CT head without contrast 01/20/2021     FINDINGS:  Brain: Moderate generalized atrophy is present. 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 Ventricles are enlarged  bilaterally. There is mild ventricular asymmetry, the right is  larger.     No acute infarct, hemorrhage, or mass lesion is present. No other  significant extra-axial collection is present.     The internal auditory canals are within normal limits. The brainstem  and cerebellum are within normal limits.     Vascular: Flow is present in the major intracranial arteries.     Skull and upper cervical spine: The craniocervical junction is  normal. Upper cervical spine is within normal limits. Marrow signal  is unremarkable.     Sinuses/Orbits: The paranasal sinuses and mastoid air cells are  clear. Bilateral lens replacements are noted. Globes and orbits are  otherwise unremarkable.     IMPRESSION:  1. No acute intracranial abnormality.  2. Moderate generalized atrophy and white matter disease likely  reflects the sequela of chronic microvascular ischemia.  3. Arachnoid cyst over the left convexity.    Patient Stated Goals I would like to walk better and not fall    Currently in Pain?  No/denies    Pain Onset In the past 7 days    Pain Onset More than a month ago           INTERVENTIONS -   Neuro:   Standing at support bar, with CGA, on airex: x several minutes of each of the following WBOS; does not require use of UE support NBOS: increased sway, up to min a for improved postural stability Semi-Tandem each LE; rates moderate challenge --progressed to with dual cognitive task (naming animals, bugs); very challenging for pt, reports difficulty with dual task, must use intermittent UE support and up to min a Standing with vertical and horizontal head turns - more challenging with vertical  head turns, frequent use of UE support Comments: requires frequent cuing for upright posture throughout and forward scanning, pt reports difficulty with maintaining upright posture due to decreased postural stability  Self Care/Home management:  Management of donning and doffing L boot, strategies to increase ease, as pt reports difficulty seeing straps due to macular degeneration, and difficulty using straps due to UE fine motor control/coordination. OT colleague assists PT with this portion of treatment. Encourages pt to place bright sticker or have spouse assist in coloring loops for straps to create bright contrast between loop and rest of boot. PT also instructs pt to place L foot on short stool to increase ease and safety with donning/doffing/reaching L boot. Further instruction provided to loosen but leave straps partially attached to Velcro on boot when taking boot off to promote ease with placing boot on later. Pt and spouse verbalized understanding for all and pt demo'd good technique following cuing.   Pt educated throughout session about proper posture and technique with exercises. Improved exercise technique, movement at target joints, use of target muscles after min to mod verbal, visual, tactile cues.    PT Education - 08/14/21 0851     Education Details exercise  technique, techniqe with donning/doffing boot    Person(s) Educated Patient;Spouse    Methods Explanation;Demonstration;Tactile cues;Verbal cues    Comprehension Verbalized understanding;Returned demonstration              PT Short Term Goals - 07/22/21 0949       PT SHORT TERM GOAL #1   Title Pt will be independent with HEP in order to improve strength and balance in order to decrease fall risk and improve function at home and work.    Baseline 07/21/2021- Paitent presents with no formal HEP in place    Time 6    Period Weeks    Status New    Target Date 09/01/21               PT Long Term Goals - 07/22/21 0950       PT LONG TERM GOAL #1   Title Pt will improve FOTO to target score of 53  to display perceived improvements in ability to complete ADL's.    Baseline 07/21/2021= 45    Time 12    Period Weeks    Status New    Target Date 10/13/21      PT LONG TERM GOAL #2   Title Pt will improve BERG by at least 5 points in order to demonstrate clinically significant improvement in balance.    Baseline 07/21/2021= 39/56    Time 12    Period Weeks    Status New    Target Date 10/13/21      PT LONG TERM GOAL #3   Title Pt will decrease 5TSTS by at least 5 seconds in order to demonstrate clinically significant improvement in LE strength.    Baseline 07/21/2021= 21 sec without UE Support    Time 12    Period Weeks    Status New    Target Date 10/13/21      PT LONG TERM GOAL #4   Title Pt will decrease TUG to below  18 seconds/decrease in order to demonstrate decreased fall risk.    Baseline 07/21/2021= 23 sec with 4WW    Time 12    Period Weeks    Status New    Target Date 10/13/21      PT LONG TERM GOAL #5  Title Pt will increase 10MWT by at least 0.23 m/s in order to demonstrate clinically significant improvement in community ambulation.    Baseline 07/21/2021= 0.61m/s    Time 12    Period Weeks    Status New    Target Date 10/13/21                    Plan - 08/14/21 0853     Clinical Impression Statement Treatment slightly limited due to pt fasting for blood work appointment following PT session. Pt instructed this session in technique with donning/doffing L boot as pt currenty having trouble doing this at home due to visual impairment and difficulty with fine motor control with UEs. Pt was able to demo good technique with donning/doffing boot following cues and instruction. The pt will benefit from further skilled PT to continue to progress balance, strength, and gait to optimize independence with ADLs.    Personal Factors and Comorbidities Comorbidity 3+    Comorbidities A- Fib, Renal cancer, Addison's disease, gout, arthritis, DM    Examination-Activity Limitations Caring for Others;Carry;Dressing;Lift;Reach Overhead;Squat;Stairs;Transfers;Stand    Examination-Participation Restrictions Investment banker, operational;Yard Work;Shop    Stability/Clinical Decision Making Evolving/Moderate complexity    Rehab Potential Good    PT Frequency 2x / week    PT Duration 12 weeks    PT Treatment/Interventions ADLs/Self Care Home Management;Canalith Repostioning;Cryotherapy;Electrical Stimulation;Moist Heat;Traction;Ultrasound;DME Instruction;Gait training;Stair training;Functional mobility training;Therapeutic activities;Therapeutic exercise;Balance training;Neuromuscular re-education;Patient/family education;Orthotic Fit/Training;Wheelchair mobility training;Manual techniques;Passive range of motion;Dry needling;Energy conservation;Taping;Vestibular    PT Next Visit Plan Upright therex to improve tolerance for standing/walking ADL's    PT Home Exercise Plan No updates today    Consulted and Agree with Plan of Care Patient;Family member/caregiver    Family Member Consulted WIfe- Tony Huffman             Patient will benefit from skilled therapeutic intervention in order to improve the following deficits and impairments:  Abnormal  gait, Decreased activity tolerance, Decreased balance, Decreased coordination, Decreased endurance, Decreased knowledge of use of DME, Decreased mobility, Decreased strength, Difficulty walking, Hypomobility, Impaired sensation, Impaired vision/preception, Postural dysfunction, Pain  Visit Diagnosis: Unsteadiness on feet  Other abnormalities of gait and mobility  Muscle weakness (generalized)  Other lack of coordination     Problem List Patient Active Problem List   Diagnosis Date Noted   Addisons disease ([place]) 03/16/2021   Low HDL (under 40) 03/16/2021   Difficulty sleeping 11/21/2020   Numbness and tingling 11/21/2020   Difficulty walking 01/14/2020   History of recent fall 01/14/2020   Sleep disorder 01/14/2020   Pancreatic mass 08/01/2018   H/O sebaceous cyst 01/06/2018   Tubular adenoma 10/28/2017   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   Edema leg 03/07/2017   Hx of atrial flutter 03/07/2017   OSA (obstructive sleep apnea) 07/24/2016   RLS (restless legs syndrome) 07/24/2016   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 [place], [place], [address]  Phone: [phone]   Fax:  [phone]  Name: Tony Huffman MRN: [id number] Date of Birth: April 26, 1946

## 2021-08-17 ENCOUNTER — Other Ambulatory Visit: Payer: Self-pay

## 2021-08-17 ENCOUNTER — Ambulatory Visit: Payer: Medicare Other

## 2021-08-17 DIAGNOSIS — R278 Other lack of coordination: Secondary | ICD-10-CM

## 2021-08-17 DIAGNOSIS — R269 Unspecified abnormalities of gait and mobility: Secondary | ICD-10-CM

## 2021-08-17 DIAGNOSIS — R262 Difficulty in walking, not elsewhere classified: Secondary | ICD-10-CM

## 2021-08-17 DIAGNOSIS — M6281 Muscle weakness (generalized): Secondary | ICD-10-CM

## 2021-08-17 DIAGNOSIS — R2681 Unsteadiness on feet: Secondary | ICD-10-CM

## 2021-08-17 NOTE — Therapy (Signed)
[place] MAIN [facility] SERVICES [address] [place], [place], [address] Phone: [phone]   Fax:  [phone]  Physical Therapy Treatment  Patient Details  Name: Tony Huffman MRN: [id number] Date of Birth: 1946/10/01 Referring Provider (PT): Dr. Joselyn Arrow   Encounter Date: 08/17/2021   PT End of Session - 08/17/21 1631     Visit Number 9    Number of Visits 25    Date for PT Re-Evaluation 10/13/21    Authorization Type Medicare Part B    Authorization Time Period 07/21/2021- 10/13/2021    Progress Note Due on Visit 10    PT Start Time 0803    PT Stop Time 0844    PT Time Calculation (min) 41 min    Equipment Utilized During Treatment Gait belt;Other (comment)   Left AFO   Activity Tolerance Patient tolerated treatment well    Behavior During Therapy [facility] for tasks assessed/performed             Past Medical History:  Diagnosis Date   Abnormal heart rhythm 10/20/2014   Addison's disease ([place])    Adrenal insufficiency ([place])    Allergy    Ambulates with cane    Anxiety    Arrhythmia    Cataract cortical, senile    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   Chronic kidney disease, stage 3 ([place]) 11/13/2011   Cough 03/29/2014   Edema leg 03/07/2017   Elevated cholesterol with high triglycerides 09/04/2013   Elevated red blood cell count    Erectile dysfunction    Falls frequently    Fever 03/29/2014   Generalized weakness 03/29/2014   GERD (gastroesophageal reflux disease)    Gout 09/04/2013   H/O adrenal insufficiency 05/17/2014   H/O atrial flutter 03/07/2017   H/O atrial flutter    H/O eye surgery    H/O sebaceous cyst 01/06/2018   H/O thyroid disease    H/O upper respiratory infection 10/15/2013   Hearing loss    History of back surgery    History of blepharoplasty    History of cancer 10/04/2015   History of chemotherapy    History of esophagogastroduodenoscopy (EGD)    History of prediabetes    Hyperlipidemia  06/16/2014   Hypertension    Hypertriglyceridemia    Hypothyroidism (acquired) 04/14/2014   Left leg swelling 11/23/2016   Low HDL (under 40)    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[place]) 11/10/2010   Malignant neoplasm of unspecified kidney, except renal pelvis (HCC)    OSA (obstructive sleep apnea) 07/24/2016   OSA (obstructive sleep apnea)    PAF (paroxysmal atrial fibrillation) (HCC)    PAF (paroxysmal atrial fibrillation) ([place])    Pancreatic mass 08/01/2018   Renal carcinoma ([place]) 06/16/2015   RLS (restless legs syndrome) 07/24/2016   Sleep apnea    Testicular hypofunction    Tubular adenoma 10/28/2017   Tussive syncopes 10/15/2013   Type 2 diabetes mellitus ([place])    Vitamin D deficiency disease     Past Surgical History:  Procedure Laterality Date   ADRENALECTOMY     CATARACT EXTRACTION  10/2014   CATARACT EXTRACTION EXTRACAPSULAR  11/05/2016   with Insertion Intraocular Prostheis.    COLONOSCOPY  05/06/2017   with removal lesions by snare.   COLONOSCOPY W/ BIOPSIES     05/06/2017   NEPHRECTOMY     POPLITEAL SYNOVIAL CYST EXCISION     SPINE SURGERY  1989   THYROIDECTOMY     THYROIDECTOMY, PARTIAL  TONSILLECTOMY      There were no vitals filed for this visit.   Subjective Assessment - 08/17/21 0808     Subjective Pt reports no pain currently and reports continues to wear his boot.    Patient is accompained by: Family member   [name]   Pertinent History Patient is a 75 year old male with referral from Neurology- Dr. Joselyn Arrow for unspecified abnormality of gait. He presents today with his wife and reports > 6 month history of progressive issues with poor balance and multiple falls. Patient has renal Cancer and currently has Pallative care- Was stabilized out of Hospice. Patient presents with the following past medical history: A-Fib and flutter, Lung Cancer, Obstructive sleep apnea, Upper respiratory infection, Addison's disease, Hypothyroidism, Renal Cancer,  DM, gout, arthritis, Chronic kidney disease and Restless leg syndrome. Patient lives with wife in independent living at [facility]. Prior level of function was independent with transfers and mobility using walker yet some assist with dressing.    Limitations Lifting;Standing;Walking;House hold activities    How long can you sit comfortably? No issues    How long can you stand comfortably? <5 min    How long can you walk comfortably? <5 min    Diagnostic tests EXAM:  MRI HEAD WITHOUT CONTRAST     TECHNIQUE:  Multiplanar, multiecho pulse sequences of the brain and surrounding  structures were obtained without intravenous contrast.     COMPARISON:  CT head without contrast 01/20/2021     FINDINGS:  Brain: Moderate generalized atrophy is present. 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 Ventricles are enlarged  bilaterally. There is mild ventricular asymmetry, the right is  larger.     No acute infarct, hemorrhage, or mass lesion is present. No other  significant extra-axial collection is present.     The internal auditory canals are within normal limits. The brainstem  and cerebellum are within normal limits.     Vascular: Flow is present in the major intracranial arteries.     Skull and upper cervical spine: The craniocervical junction is  normal. Upper cervical spine is within normal limits. Marrow signal  is unremarkable.     Sinuses/Orbits: The paranasal sinuses and mastoid air cells are  clear. Bilateral lens replacements are noted. Globes and orbits are  otherwise unremarkable.     IMPRESSION:  1. No acute intracranial abnormality.  2. Moderate generalized atrophy and white matter disease likely  reflects the sequela of chronic microvascular ischemia.  3. Arachnoid cyst over the left convexity.    Patient Stated Goals I would like to walk better and not fall    Currently in Pain? No/denies    Pain Onset In the past 7 days    Pain Onset More than a month ago                                         PT Education - 08/17/21 1632     Education Details Gait safety and exercise technique.    Person(s) Educated Patient    Methods Explanation;Demonstration;Tactile cues;Verbal cues;Handout    Comprehension Verbalized understanding;Returned demonstration;Verbal cues required;Need further instruction;Tactile cues required              PT Short Term Goals - 07/22/21 0949       PT SHORT TERM GOAL #1   Title Pt will be  independent with HEP in order to improve strength and balance in order to decrease fall risk and improve function at home and work.    Baseline 07/21/2021- Paitent presents with no formal HEP in place    Time 6    Period Weeks    Status New    Target Date 09/01/21               PT Long Term Goals - 07/22/21 0950       PT LONG TERM GOAL #1   Title Pt will improve FOTO to target score of 53  to display perceived improvements in ability to complete ADL's.    Baseline 07/21/2021= 45    Time 12    Period Weeks    Status New    Target Date 10/13/21      PT LONG TERM GOAL #2   Title Pt will improve BERG by at least 5 points in order to demonstrate clinically significant improvement in balance.    Baseline 07/21/2021= 39/56    Time 12    Period Weeks    Status New    Target Date 10/13/21      PT LONG TERM GOAL #3   Title Pt will decrease 5TSTS by at least 5 seconds in order to demonstrate clinically significant improvement in LE strength.    Baseline 07/21/2021= 21 sec without UE Support    Time 12    Period Weeks    Status New    Target Date 10/13/21      PT LONG TERM GOAL #4   Title Pt will decrease TUG to below  18 seconds/decrease in order to demonstrate decreased fall risk.    Baseline 07/21/2021= 23 sec with 4WW    Time 12    Period Weeks    Status New    Target Date 10/13/21      PT LONG TERM GOAL #5   Title Pt will increase 10MWT by at least 0.23 m/s in order to demonstrate clinically  significant improvement in community ambulation.    Baseline 07/21/2021= 0.41m/s    Time 12    Period Weeks    Status New    Target Date 10/13/21            INTERVENTIONS:   Therapeutic exercises:   Patient instructed in standing LE strengthening for home exercise program-  Stand hip march Stand hip ext Stand hip abd Stand knee flex Stand squat 12 reps each BLE with rest break between each exercises. VC/TC for correct technique.   Heel to toe gait sequencing activity- Placed 1/2 foam in front of patient for heel strike and patient started with one LE in toe off position while opp LE in stance position (UE holding onto support bar) and instructed to swing leg forward from toe off to heel strike with heel making contact with floor and toe box making contact with foam to ensure she lifts  her leg.   Patient then ambulated 160 feet with 4WW with instruction to concentrate  on heel strike and longer step length- He was able to improve with VC yet if patient began conversation - he would lose focus and revert back to dragging left foot- VC to stay on task- CGA for mobility using Gait belt.  Education provided throughout session via VC/TC and demonstration to facilitate movement at target joints and correct muscle activation for all testing and exercises performed.   Clinical Impression: Patient presented with excellent motivation today and responded well  to instruction in LE strengthening without complaint of pain- only fatigue today. He also was able to demo progression with improved gait quality including increased heel strike and step length yet requiring concentration and intermittent VC's today. The pt will benefit from further skilled PT to continue to progress balance, strength, and gait to optimize independence with ADLs.     Access Code: [email or web address] URL: [email or web address] Date: 08/17/2021 Prepared by: Sande Brothers, PT  Exercises Standing March with  Counter Support - 1 x daily - 3 x weekly - 3 sets - 10 reps Standing Knee Flexion with Counter Support - 1 x daily - 3 x weekly - 3 sets - 10 reps Standing Hip Extension with Unilateral Counter Support - 1 x daily - 3 x weekly - 3 sets - 10 reps Standing Hip Abduction with Unilateral Counter Support - 1 x daily - 3 x weekly - 3 sets - 10 reps Mini Squat with Counter Support - 1 x daily - 3 x weekly - 3 sets - 10 reps      Plan - 08/17/21 1629     Clinical Impression Statement Patient presented with excellent motivation today and responded well to instruction in LE strengthening without complaint of pain- only fatigue today. He also was able to demo progression with improved gait quality including increased heel strike and step length yet requiring concentration and intermittent VC's today. The pt will benefit from further skilled PT to continue to progress balance, strength, and gait to optimize independence with ADLs.    Personal Factors and Comorbidities Comorbidity 3+    Comorbidities A- Fib, Renal cancer, Addison's disease, gout, arthritis, DM    Examination-Activity Limitations Caring for Others;Carry;Dressing;Lift;Reach Overhead;Squat;Stairs;Transfers;Stand    Examination-Participation Restrictions Investment banker, operational;Yard Work;Shop    Stability/Clinical Decision Making Evolving/Moderate complexity    Rehab Potential Good    PT Frequency 2x / week    PT Duration 12 weeks    PT Treatment/Interventions ADLs/Self Care Home Management;Canalith Repostioning;Cryotherapy;Electrical Stimulation;Moist Heat;Traction;Ultrasound;DME Instruction;Gait training;Stair training;Functional mobility training;Therapeutic activities;Therapeutic exercise;Balance training;Neuromuscular re-education;Patient/family education;Orthotic Fit/Training;Wheelchair mobility training;Manual techniques;Passive range of motion;Dry needling;Energy conservation;Taping;Vestibular    PT Next Visit Plan  Progressive therex to improve tolerance for standing/walking ADL's    PT Home Exercise Plan 08/17/2021-Access Code: [email or web address]    Consulted and Agree with Plan of Care Patient;Family member/caregiver    Family Member Consulted WIfe- [name]             Patient will benefit from skilled therapeutic intervention in order to improve the following deficits and impairments:  Abnormal gait, Decreased activity tolerance, Decreased balance, Decreased coordination, Decreased endurance, Decreased knowledge of use of DME, Decreased mobility, Decreased strength, Difficulty walking, Hypomobility, Impaired sensation, Impaired vision/preception, Postural dysfunction, Pain  Visit Diagnosis: Abnormality of gait and mobility  Difficulty in walking, not elsewhere classified  Muscle weakness (generalized)  Other lack of coordination  Unsteadiness on feet     Problem List Patient Active Problem List   Diagnosis Date Noted   Addisons disease ([place]) 03/16/2021   Low HDL (under 40) 03/16/2021   Difficulty sleeping 11/21/2020   Numbness and tingling 11/21/2020   Difficulty walking 01/14/2020   History of recent fall 01/14/2020   Sleep disorder 01/14/2020   Pancreatic mass 08/01/2018   H/O sebaceous cyst 01/06/2018   Tubular adenoma 10/28/2017   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   Edema leg 03/07/2017   Hx of atrial flutter 03/07/2017   OSA (obstructive sleep apnea) 07/24/2016   RLS (restless legs syndrome) 07/24/2016   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 [place], [place], [address] Phone: [phone]   Fax:  [phone]  Name: Garren Greenman MRN: [id number] Date of Birth: 1945-10-28

## 2021-08-21 ENCOUNTER — Other Ambulatory Visit: Payer: Self-pay

## 2021-08-21 ENCOUNTER — Ambulatory Visit: Payer: Medicare Other

## 2021-08-21 DIAGNOSIS — R269 Unspecified abnormalities of gait and mobility: Secondary | ICD-10-CM

## 2021-08-21 DIAGNOSIS — R2681 Unsteadiness on feet: Secondary | ICD-10-CM

## 2021-08-21 DIAGNOSIS — M6281 Muscle weakness (generalized): Secondary | ICD-10-CM

## 2021-08-21 DIAGNOSIS — R262 Difficulty in walking, not elsewhere classified: Secondary | ICD-10-CM

## 2021-08-21 NOTE — Therapy (Signed)
[place] MAIN [facility] SERVICES [address] [place], [place], [address] Phone: [phone]   Fax:  [phone]  Physical Therapy Treatment/Physical Therapy Progress Note   Dates of reporting period  07/21/2021  to   08/21/2021  Patient Details  Name: Tony Huffman MRN: [id number] Date of Birth: 20-Apr-1946 Referring Provider (PT): Dr. Joselyn Arrow   Encounter Date: 08/21/2021   PT End of Session - 08/21/21 0807     Visit Number 10    Number of Visits 25    Date for PT Re-Evaluation 10/13/21    Authorization Type Medicare Part B    Authorization Time Period 07/21/2021- 10/13/2021    Progress Note Due on Visit 10    PT Start Time 0808    PT Stop Time 0844    PT Time Calculation (min) 36 min    Equipment Utilized During Treatment Gait belt;Other (comment)   Left AFO   Activity Tolerance Patient tolerated treatment well    Behavior During Therapy [facility] for tasks assessed/performed             Past Medical History:  Diagnosis Date   Abnormal heart rhythm 10/20/2014   Addison's disease ([place])    Adrenal insufficiency ([place])    Allergy    Ambulates with cane    Anxiety    Arrhythmia    Cataract cortical, senile    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   Chronic kidney disease, stage 3 ([place]) 11/13/2011   Cough 03/29/2014   Edema leg 03/07/2017   Elevated cholesterol with high triglycerides 09/04/2013   Elevated red blood cell count    Erectile dysfunction    Falls frequently    Fever 03/29/2014   Generalized weakness 03/29/2014   GERD (gastroesophageal reflux disease)    Gout 09/04/2013   H/O adrenal insufficiency 05/17/2014   H/O atrial flutter 03/07/2017   H/O atrial flutter    H/O eye surgery    H/O sebaceous cyst 01/06/2018   H/O thyroid disease    H/O upper respiratory infection 10/15/2013   Hearing loss    History of back surgery    History of blepharoplasty    History of cancer 10/04/2015   History of chemotherapy     History of esophagogastroduodenoscopy (EGD)    History of prediabetes    Hyperlipidemia 06/16/2014   Hypertension    Hypertriglyceridemia    Hypothyroidism (acquired) 04/14/2014   Left leg swelling 11/23/2016   Low HDL (under 40)    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[place]) 11/10/2010   Malignant neoplasm of unspecified kidney, except renal pelvis (HCC)    OSA (obstructive sleep apnea) 07/24/2016   OSA (obstructive sleep apnea)    PAF (paroxysmal atrial fibrillation) (HCC)    PAF (paroxysmal atrial fibrillation) ([place])    Pancreatic mass 08/01/2018   Renal carcinoma ([place]) 06/16/2015   RLS (restless legs syndrome) 07/24/2016   Sleep apnea    Testicular hypofunction    Tubular adenoma 10/28/2017   Tussive syncopes 10/15/2013   Type 2 diabetes mellitus ([place])    Vitamin D deficiency disease     Past Surgical History:  Procedure Laterality Date   ADRENALECTOMY     CATARACT EXTRACTION  10/2014   CATARACT EXTRACTION EXTRACAPSULAR  11/05/2016   with Insertion Intraocular Prostheis.    COLONOSCOPY  05/06/2017   with removal lesions by snare.   COLONOSCOPY W/ BIOPSIES     05/06/2017   NEPHRECTOMY     POPLITEAL SYNOVIAL CYST EXCISION  SPINE SURGERY  1989   THYROIDECTOMY     THYROIDECTOMY, PARTIAL     TONSILLECTOMY      There were no vitals filed for this visit.   Subjective Assessment - 08/21/21 0807     Subjective Patient reports having a rough morning - having issues with putting on his boot.    Patient is accompained by: Family member   [name]   Pertinent History Patient is a 75 year old male with referral from Neurology- Dr. Joselyn Arrow for unspecified abnormality of gait. He presents today with his wife and reports > 6 month history of progressive issues with poor balance and multiple falls. Patient has renal Cancer and currently has Pallative care- Was stabilized out of Hospice. Patient presents with the following past medical history: A-Fib and flutter, Lung Cancer,  Obstructive sleep apnea, Upper respiratory infection, Addison's disease, Hypothyroidism, Renal Cancer, DM, gout, arthritis, Chronic kidney disease and Restless leg syndrome. Patient lives with wife in independent living at [facility]. Prior level of function was independent with transfers and mobility using walker yet some assist with dressing.    Limitations Lifting;Standing;Walking;House hold activities    How long can you sit comfortably? No issues    How long can you stand comfortably? <5 min    How long can you walk comfortably? <5 min    Diagnostic tests EXAM:  MRI HEAD WITHOUT CONTRAST     TECHNIQUE:  Multiplanar, multiecho pulse sequences of the brain and surrounding  structures were obtained without intravenous contrast.     COMPARISON:  CT head without contrast 01/20/2021     FINDINGS:  Brain: Moderate generalized atrophy is present. 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 Ventricles are enlarged  bilaterally. There is mild ventricular asymmetry, the right is  larger.     No acute infarct, hemorrhage, or mass lesion is present. No other  significant extra-axial collection is present.     The internal auditory canals are within normal limits. The brainstem  and cerebellum are within normal limits.     Vascular: Flow is present in the major intracranial arteries.     Skull and upper cervical spine: The craniocervical junction is  normal. Upper cervical spine is within normal limits. Marrow signal  is unremarkable.     Sinuses/Orbits: The paranasal sinuses and mastoid air cells are  clear. Bilateral lens replacements are noted. Globes and orbits are  otherwise unremarkable.     IMPRESSION:  1. No acute intracranial abnormality.  2. Moderate generalized atrophy and white matter disease likely  reflects the sequela of chronic microvascular ischemia.  3. Arachnoid cyst over the left convexity.    Patient Stated Goals I would like to walk better and not fall    Currently in Pain? No/denies     Pain Onset In the past 7 days    Pain Onset --              Interventions:    [facility] PT Assessment - 08/21/21 0838       Berg Balance Test   Sit to Stand Able to stand without using hands and stabilize independently    Standing Unsupported Able to stand safely 2 minutes    Sitting with Back Unsupported but Feet Supported on Floor or Stool Able to sit safely and securely 2 minutes    Stand to Sit Sits safely with minimal use of hands    Transfers Able to transfer safely, definite need of  hands    Standing Unsupported with Eyes Closed Able to stand 10 seconds with supervision    Standing Unsupported with Feet Together Able to place feet together independently and stand for 1 minute with supervision    From Standing, Reach Forward with Outstretched Arm Can reach confidently >25 cm (10")    From Standing Position, Pick up Object from [place] to pick up shoe, needs supervision    From Standing Position, Turn to Look Behind Over each Shoulder Turn sideways only but maintains balance    Turn 360 Degrees Able to turn 360 degrees safely but slowly    Standing Unsupported, Alternately Place Feet on Step/Stool Able to stand independently and safely and complete 8 steps in 20 seconds    Standing Unsupported, One Foot in Front Able to plae foot ahead of the other independently and hold 30 seconds    Standing on One Leg Able to lift leg independently and hold 5-10 seconds    Total Score 46            Reassessed STG:   HEP- GOAL MET   Reassessed LTG:   5xSTS= 14.0 sec without UE support (improved from 21 sec) -Will Keep goal active to ensure consistency.   TUG= 22.13 sec with 4WW (improved from 23 sec)   10MWT= 0.57 m/s (improved from 0.43 m/s)  BERG= 46/56 (improved from 39/56)   Clinical Impression: Treatment was limited as patient had to go to bathroom upon arrival. He arrived with excellent motivation and performed very well overall today. He was reassessed including  checking all of his STG/LTGs. He has met his short term goal of being independent with initial HEP and no questions- with good recall of prescribed exercises. He then performed several other functional outcome measures and demonstrated progress with  all goals including improved LE Strength and mobility indicating a decreased risk of falling. He continues to walk with decreased heel to toe and benefit from ongoing Balance/strength training. Patient's condition has the potential to improve in response to therapy. Maximum improvement is yet to be obtained. The anticipated improvement is attainable and reasonable in a generally predictable time.                        PT Education - 08/21/21 0807     Education Details Exercise technique    Person(s) Educated Patient    Methods Explanation;Demonstration;Tactile cues;Verbal cues    Comprehension Verbalized understanding;Returned demonstration;Verbal cues required;Need further instruction;Tactile cues required              PT Short Term Goals - 08/21/21 0814       PT SHORT TERM GOAL #1   Title Pt will be independent with HEP in order to improve strength and balance in order to decrease fall risk and improve function at home and work.    Baseline 07/21/2021- Paitent presents with no formal HEP in place. 08/21/2021- Patient reports compliance with HEP and states no questions as of today- able to perform standin using his rollator for support.    Time 6    Period Weeks    Status Achieved    Target Date 09/01/21               PT Long Term Goals - 08/21/21 0817       PT LONG TERM GOAL #1   Title Pt will improve FOTO to target score of 53  to display perceived improvements in ability to complete  ADL's.    Baseline 07/21/2021= 45    Time 12    Period Weeks    Status New    Target Date 10/13/21      PT LONG TERM GOAL #2   Title Pt will improve BERG by at least 5 points in order to demonstrate clinically significant  improvement in balance.    Baseline 07/21/2021= 39/56; 08/21/2021=46/56- Will keep goal active to ensure patient able to test consistently    Time 12    Period Weeks    Status On-going    Target Date 10/13/21      PT LONG TERM GOAL #3   Title Pt will decrease 5TSTS by at least 5 seconds in order to demonstrate clinically significant improvement in LE strength.    Baseline 07/21/2021= 21 sec without UE Support; 11/21= 14.0 sec without UE support-.Will Keep goal active to ensure consistency.    Time 12    Period Weeks    Status On-going    Target Date 10/13/21      PT LONG TERM GOAL #4   Title Pt will decrease TUG to below  18 seconds/decrease in order to demonstrate decreased fall risk.    Baseline 07/21/2021= 23 sec with 4WW; 08/21/2021= 22.13 sec with 4WW    Time 12    Period Weeks    Status On-going    Target Date 10/13/21      PT LONG TERM GOAL #5   Title Pt will increase 10MWT by at least 0.23 m/s in order to demonstrate clinically significant improvement in community ambulation.    Baseline 07/21/2021= 0.25m/s; 08/21/2021= 0.57 m/s using 4WW    Time 12    Period Weeks    Status On-going    Target Date 10/13/21                   Plan - 08/21/21 0808     Clinical Impression Statement Treatment was limited as patient had to go to bathroom upon arrival. He arrived with excellent motivation and performed very well overall today. He was reassessed including checking all of his STG/LTGs. He has met his short term goal of being independent with initial HEP and no questions- with good recall of prescribed exercises. He then performed several other functional outcome measures and demonstrated progress with  all goals including improved LE Strength and mobility indicating a decreased risk of falling. He continues to walk with decreased heel to toe and benefit from ongoing Balance/strength training. Patient's condition has the potential to improve in response to therapy. Maximum  improvement is yet to be obtained. The anticipated improvement is attainable and reasonable in a generally predictable time.    Personal Factors and Comorbidities Comorbidity 3+    Comorbidities A- Fib, Renal cancer, Addison's disease, gout, arthritis, DM    Examination-Activity Limitations Caring for Others;Carry;Dressing;Lift;Reach Overhead;Squat;Stairs;Transfers;Stand    Examination-Participation Restrictions Investment banker, operational;Yard Work;Shop    Stability/Clinical Decision Making Evolving/Moderate complexity    Rehab Potential Good    PT Frequency 2x / week    PT Duration 12 weeks    PT Treatment/Interventions ADLs/Self Care Home Management;Canalith Repostioning;Cryotherapy;Electrical Stimulation;Moist Heat;Traction;Ultrasound;DME Instruction;Gait training;Stair training;Functional mobility training;Therapeutic activities;Therapeutic exercise;Balance training;Neuromuscular re-education;Patient/family education;Orthotic Fit/Training;Wheelchair mobility training;Manual techniques;Passive range of motion;Dry needling;Energy conservation;Taping;Vestibular    PT Next Visit Plan Progressive therex to improve tolerance for standing/walking ADL's    PT Home Exercise Plan 08/17/2021-Access Code: [email or web address]    Consulted and Agree with Plan of Care Patient;Family member/caregiver    Family Member Consulted  WIfe- [name]             Patient will benefit from skilled therapeutic intervention in order to improve the following deficits and impairments:  Abnormal gait, Decreased activity tolerance, Decreased balance, Decreased coordination, Decreased endurance, Decreased knowledge of use of DME, Decreased mobility, Decreased strength, Difficulty walking, Hypomobility, Impaired sensation, Impaired vision/preception, Postural dysfunction, Pain  Visit Diagnosis: Abnormality of gait and mobility  Difficulty in walking, not elsewhere classified  Muscle weakness (generalized)  Unsteadiness on  feet     Problem List Patient Active Problem List   Diagnosis Date Noted   Addisons disease ([place]) 03/16/2021   Low HDL (under 40) 03/16/2021   Difficulty sleeping 11/21/2020   Numbness and tingling 11/21/2020   Difficulty walking 01/14/2020   History of recent fall 01/14/2020   Sleep disorder 01/14/2020   Pancreatic mass 08/01/2018   H/O sebaceous cyst 01/06/2018   Tubular adenoma 10/28/2017   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   Edema leg 03/07/2017   Hx of atrial flutter 03/07/2017   OSA (obstructive sleep apnea) 07/24/2016   RLS (restless legs syndrome) 07/24/2016   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   Atrial fibrillation and flutter ([place]) 10/20/2014   Hyperlipidemia 06/16/2014   Adrenal crisis ([place]) 04/19/2014   Cough 03/29/2014   Weakness 03/29/2014   Cough syncope 10/15/2013   URI (upper respiratory infection) 10/15/2013   Dyslipidemia 09/04/2013   Gout 09/04/2013   Chronic kidney disease, stage III (moderate) ([place]) 11/13/2011   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   Hypothyroidism (acquired) 11/30/2010   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[place]) 11/10/2010    Lewis Moccasin, PT 08/21/2021, 4:49 PM  [place] MAIN [facility] SERVICES [address] [place], [place], [address] Phone: [phone]   Fax:  [phone]  Name: Tony Huffman MRN: [id number] Date of Birth: May 05, 1946

## 2021-08-23 ENCOUNTER — Other Ambulatory Visit: Payer: Self-pay

## 2021-08-23 ENCOUNTER — Ambulatory Visit: Payer: Medicare Other

## 2021-08-23 DIAGNOSIS — R269 Unspecified abnormalities of gait and mobility: Secondary | ICD-10-CM

## 2021-08-23 DIAGNOSIS — R2681 Unsteadiness on feet: Secondary | ICD-10-CM

## 2021-08-23 DIAGNOSIS — M6281 Muscle weakness (generalized): Secondary | ICD-10-CM

## 2021-08-23 DIAGNOSIS — R262 Difficulty in walking, not elsewhere classified: Secondary | ICD-10-CM

## 2021-08-23 NOTE — Therapy (Signed)
[place] MAIN [facility] SERVICES [address] [place], [place], [address] Phone: [phone]   Fax:  [phone]  Physical Therapy Treatment  Patient Details  Name: Tony Huffman MRN: [id number] Date of Birth: July 21, 1946 Referring Provider (PT): Dr. Joselyn Arrow   Encounter Date: 08/23/2021   PT End of Session - 08/23/21 1114     Visit Number 11    Number of Visits 25    Date for PT Re-Evaluation 10/13/21    Authorization Type Medicare Part B    Authorization Time Period 07/21/2021- 10/13/2021    Progress Note Due on Visit 10    PT Start Time 0848    PT Stop Time 0928    PT Time Calculation (min) 40 min    Equipment Utilized During Treatment Gait belt;Other (comment)   Left AFO   Activity Tolerance Patient tolerated treatment well    Behavior During Therapy [facility] for tasks assessed/performed             Past Medical History:  Diagnosis Date   Abnormal heart rhythm 10/20/2014   Addison's disease ([place])    Adrenal insufficiency ([place])    Allergy    Ambulates with cane    Anxiety    Arrhythmia    Cataract cortical, senile    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   Chronic kidney disease, stage 3 ([place]) 11/13/2011   Cough 03/29/2014   Edema leg 03/07/2017   Elevated cholesterol with high triglycerides 09/04/2013   Elevated red blood cell count    Erectile dysfunction    Falls frequently    Fever 03/29/2014   Generalized weakness 03/29/2014   GERD (gastroesophageal reflux disease)    Gout 09/04/2013   H/O adrenal insufficiency 05/17/2014   H/O atrial flutter 03/07/2017   H/O atrial flutter    H/O eye surgery    H/O sebaceous cyst 01/06/2018   H/O thyroid disease    H/O upper respiratory infection 10/15/2013   Hearing loss    History of back surgery    History of blepharoplasty    History of cancer 10/04/2015   History of chemotherapy    History of esophagogastroduodenoscopy (EGD)    History of prediabetes    Hyperlipidemia  06/16/2014   Hypertension    Hypertriglyceridemia    Hypothyroidism (acquired) 04/14/2014   Left leg swelling 11/23/2016   Low HDL (under 40)    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[place]) 11/10/2010   Malignant neoplasm of unspecified kidney, except renal pelvis (HCC)    OSA (obstructive sleep apnea) 07/24/2016   OSA (obstructive sleep apnea)    PAF (paroxysmal atrial fibrillation) (HCC)    PAF (paroxysmal atrial fibrillation) ([place])    Pancreatic mass 08/01/2018   Renal carcinoma ([place]) 06/16/2015   RLS (restless legs syndrome) 07/24/2016   Sleep apnea    Testicular hypofunction    Tubular adenoma 10/28/2017   Tussive syncopes 10/15/2013   Type 2 diabetes mellitus ([place])    Vitamin D deficiency disease     Past Surgical History:  Procedure Laterality Date   ADRENALECTOMY     CATARACT EXTRACTION  10/2014   CATARACT EXTRACTION EXTRACAPSULAR  11/05/2016   with Insertion Intraocular Prostheis.    COLONOSCOPY  05/06/2017   with removal lesions by snare.   COLONOSCOPY W/ BIOPSIES     05/06/2017   NEPHRECTOMY     POPLITEAL SYNOVIAL CYST EXCISION     SPINE SURGERY  1989   THYROIDECTOMY     THYROIDECTOMY, PARTIAL  TONSILLECTOMY      There were no vitals filed for this visit.   Subjective Assessment - 08/23/21 1112     Subjective Patient reports having a better day today than Monday- States no pain- Reports his big toe was bothering him earier but no pain presently.    Patient is accompained by: Family member   [name]   Pertinent History Patient is a 75 year old male with referral from Neurology- Dr. Joselyn Arrow for unspecified abnormality of gait. He presents today with his wife and reports > 6 month history of progressive issues with poor balance and multiple falls. Patient has renal Cancer and currently has Pallative care- Was stabilized out of Hospice. Patient presents with the following past medical history: A-Fib and flutter, Lung Cancer, Obstructive sleep apnea, Upper  respiratory infection, Addison's disease, Hypothyroidism, Renal Cancer, DM, gout, arthritis, Chronic kidney disease and Restless leg syndrome. Patient lives with wife in independent living at [facility]. Prior level of function was independent with transfers and mobility using walker yet some assist with dressing.    Limitations Lifting;Standing;Walking;House hold activities    How long can you sit comfortably? No issues    How long can you stand comfortably? <5 min    How long can you walk comfortably? <5 min    Diagnostic tests EXAM:  MRI HEAD WITHOUT CONTRAST     TECHNIQUE:  Multiplanar, multiecho pulse sequences of the brain and surrounding  structures were obtained without intravenous contrast.     COMPARISON:  CT head without contrast 01/20/2021     FINDINGS:  Brain: Moderate generalized atrophy is present. 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 Ventricles are enlarged  bilaterally. There is mild ventricular asymmetry, the right is  larger.     No acute infarct, hemorrhage, or mass lesion is present. No other  significant extra-axial collection is present.     The internal auditory canals are within normal limits. The brainstem  and cerebellum are within normal limits.     Vascular: Flow is present in the major intracranial arteries.     Skull and upper cervical spine: The craniocervical junction is  normal. Upper cervical spine is within normal limits. Marrow signal  is unremarkable.     Sinuses/Orbits: The paranasal sinuses and mastoid air cells are  clear. Bilateral lens replacements are noted. Globes and orbits are  otherwise unremarkable.     IMPRESSION:  1. No acute intracranial abnormality.  2. Moderate generalized atrophy and white matter disease likely  reflects the sequela of chronic microvascular ischemia.  3. Arachnoid cyst over the left convexity.    Patient Stated Goals I would like to walk better and not fall    Currently in Pain? No/denies               INTERVENTIONS:    Heel to toe gait sequencing activity- Placed 1/2 foam in front of patient for heel strike and patient started with one LE in toe off position while opp LE in stance position (UE holding onto support bar) and instructed to swing leg forward from toe off to heel strike with heel making contact with floor and toe box making contact with foam to ensure  he lifts his leg using GTB at ankle for resistance - 2 sets of 12 reps each leg.   Step tap onto 6" block using 3lb ankle weights x BLE  Sit to stand without UE support (VC and visual demo for correct  technique) x 12 reps. Patient reported as "medium"  Seated knee ext with 3 lb ankle weights- BLE x 12 reps x 1 set then another set focusing on more eccentric control x 12 reps.   Seated Hip flex/abd up and over green therapad x 12 reps (Increased difficulty with left LE)   Patient performed 2 trials of 10 meter walk today- No cues during 1st trial however patient was able to at least initially start off with good reciprocal steps and remain close within walker but toward end of walk- did demo tendency to push walker too far and drag Left LE.  = 21.77 sec On 2nd trial- PT provided VC throughout walk for cadence - PT voiced "Left, Right Left" initially yet patient then asked PT not to voice cadence as this produced "bad memories from [employer]" so PT stopped with voice cues and patient performed = 19.73 sec- slowing down at end or would have been even faster.    Education provided throughout session via VC/TC and demonstration to facilitate movement at target joints and correct muscle activation for all testing and exercises performed.   Clinical Impression: Patient arrived to clinic with good motivation and participated well during visit with intermittent short rest breaks. He continues to present with more left LE weakness requiring increased concentration to complete tasks. He did improve with gait quality with Vcs as well  today. The pt will benefit from further skilled PT to continue to progress balance, strength, and gait to optimize independence with ADLs.                      PT Education - 08/23/21 1113     Education Details Exercise technique in standing/seated position    Person(s) Educated Patient    Methods Explanation;Demonstration;Tactile cues;Verbal cues    Comprehension Verbalized understanding;Returned demonstration;Verbal cues required;Need further instruction;Tactile cues required              PT Short Term Goals - 08/21/21 0814       PT SHORT TERM GOAL #1   Title Pt will be independent with HEP in order to improve strength and balance in order to decrease fall risk and improve function at home and work.    Baseline 07/21/2021- Paitent presents with no formal HEP in place. 08/21/2021- Patient reports compliance with HEP and states no questions as of today- able to perform standin using his rollator for support.    Time 6    Period Weeks    Status Achieved    Target Date 09/01/21               PT Long Term Goals - 08/21/21 0817       PT LONG TERM GOAL #1   Title Pt will improve FOTO to target score of 53  to display perceived improvements in ability to complete ADL's.    Baseline 07/21/2021= 45    Time 12    Period Weeks    Status New    Target Date 10/13/21      PT LONG TERM GOAL #2   Title Pt will improve BERG by at least 5 points in order to demonstrate clinically significant improvement in balance.    Baseline 07/21/2021= 39/56; 08/21/2021=46/56- Will keep goal active to ensure patient able to test consistently    Time 12    Period Weeks    Status On-going    Target Date 10/13/21      PT LONG TERM GOAL #3  Title Pt will decrease 5TSTS by at least 5 seconds in order to demonstrate clinically significant improvement in LE strength.    Baseline 07/21/2021= 21 sec without UE Support; 11/21= 14.0 sec without UE support-.Will Keep goal active to  ensure consistency.    Time 12    Period Weeks    Status On-going    Target Date 10/13/21      PT LONG TERM GOAL #4   Title Pt will decrease TUG to below  18 seconds/decrease in order to demonstrate decreased fall risk.    Baseline 07/21/2021= 23 sec with 4WW; 08/21/2021= 22.13 sec with 4WW    Time 12    Period Weeks    Status On-going    Target Date 10/13/21      PT LONG TERM GOAL #5   Title Pt will increase 10MWT by at least 0.23 m/s in order to demonstrate clinically significant improvement in community ambulation.    Baseline 07/21/2021= 0.65m/s; 08/21/2021= 0.57 m/s using 4WW    Time 12    Period Weeks    Status On-going    Target Date 10/13/21                   Plan - 08/23/21 1114     Clinical Impression Statement Patient arrived to clinic with good motivation and participated well during visit with intermittent short rest breaks. He continues to present with more left LE weakness requiring increased concentration to complete tasks. He did improve with gait quality with Vcs as well today. The pt will benefit from further skilled PT to continue to progress balance, strength, and gait to optimize independence with ADLs.    Personal Factors and Comorbidities Comorbidity 3+    Comorbidities A- Fib, Renal cancer, Addison's disease, gout, arthritis, DM    Examination-Activity Limitations Caring for Others;Carry;Dressing;Lift;Reach Overhead;Squat;Stairs;Transfers;Stand    Examination-Participation Restrictions Investment banker, operational;Yard Work;Shop    Stability/Clinical Decision Making Evolving/Moderate complexity    Rehab Potential Good    PT Frequency 2x / week    PT Duration 12 weeks    PT Treatment/Interventions ADLs/Self Care Home Management;Canalith Repostioning;Cryotherapy;Electrical Stimulation;Moist Heat;Traction;Ultrasound;DME Instruction;Gait training;Stair training;Functional mobility training;Therapeutic activities;Therapeutic exercise;Balance  training;Neuromuscular re-education;Patient/family education;Orthotic Fit/Training;Wheelchair mobility training;Manual techniques;Passive range of motion;Dry needling;Energy conservation;Taping;Vestibular    PT Next Visit Plan Progressive therex to improve tolerance for standing/walking ADL's    PT Home Exercise Plan 08/17/2021-Access Code: [email or web address]    Consulted and Agree with Plan of Care Patient;Family member/caregiver    Family Member Consulted WIfe- [name]             Patient will benefit from skilled therapeutic intervention in order to improve the following deficits and impairments:  Abnormal gait, Decreased activity tolerance, Decreased balance, Decreased coordination, Decreased endurance, Decreased knowledge of use of DME, Decreased mobility, Decreased strength, Difficulty walking, Hypomobility, Impaired sensation, Impaired vision/preception, Postural dysfunction, Pain  Visit Diagnosis: Abnormality of gait and mobility  Difficulty in walking, not elsewhere classified  Muscle weakness (generalized)  Unsteadiness on feet     Problem List Patient Active Problem List   Diagnosis Date Noted   Addisons disease ([place]) 03/16/2021   Low HDL (under 40) 03/16/2021   Difficulty sleeping 11/21/2020   Numbness and tingling 11/21/2020   Difficulty walking 01/14/2020   History of recent fall 01/14/2020   Sleep disorder 01/14/2020   Pancreatic mass 08/01/2018   H/O sebaceous cyst 01/06/2018   Tubular adenoma 10/28/2017   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   Edema leg 03/07/2017   Hx of  atrial flutter 03/07/2017   OSA (obstructive sleep apnea) 07/24/2016   RLS (restless legs syndrome) 07/24/2016   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   Atrial fibrillation and flutter ([place]) 10/20/2014   Hyperlipidemia 06/16/2014   Adrenal crisis ([place]) 04/19/2014   Cough 03/29/2014   Weakness 03/29/2014   Cough syncope 10/15/2013   URI (upper respiratory infection)  10/15/2013   Dyslipidemia 09/04/2013   Gout 09/04/2013   Chronic kidney disease, stage III (moderate) ([place]) 11/13/2011   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   Hypothyroidism (acquired) 11/30/2010   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[place]) 11/10/2010    Lewis Moccasin, PT 08/23/2021, 11:29 AM  [place] MAIN [facility] SERVICES [address] [place], [place], [address] Phone: [phone]   Fax:  [phone]  Name: Tony Huffman MRN: [id number] Date of Birth: 1946/04/07

## 2021-08-28 ENCOUNTER — Other Ambulatory Visit: Payer: Self-pay

## 2021-08-28 ENCOUNTER — Ambulatory Visit: Payer: Medicare Other

## 2021-08-28 DIAGNOSIS — M6281 Muscle weakness (generalized): Secondary | ICD-10-CM

## 2021-08-28 DIAGNOSIS — R269 Unspecified abnormalities of gait and mobility: Secondary | ICD-10-CM | POA: Diagnosis not present

## 2021-08-28 DIAGNOSIS — R278 Other lack of coordination: Secondary | ICD-10-CM

## 2021-08-28 DIAGNOSIS — R2689 Other abnormalities of gait and mobility: Secondary | ICD-10-CM

## 2021-08-28 DIAGNOSIS — R2681 Unsteadiness on feet: Secondary | ICD-10-CM

## 2021-08-28 NOTE — Therapy (Signed)
[place] MAIN [facility] SERVICES [address] [place], [place], [address] Phone: [phone]   Fax:  [phone]  Physical Therapy Treatment  Patient Details  Name: Tony Huffman MRN: [id number] Date of Birth: July 22, 1946 Referring Provider (PT): Dr. Joselyn Arrow   Encounter Date: 08/28/2021   PT End of Session - 08/28/21 0912     Visit Number 12    Number of Visits 25    Date for PT Re-Evaluation 10/13/21    Authorization Type Medicare Part B    Authorization Time Period 07/21/2021- 10/13/2021    Progress Note Due on Visit 10    PT Start Time 0807    PT Stop Time 0851    PT Time Calculation (min) 44 min    Equipment Utilized During Treatment Gait belt;Other (comment)   Left AFO   Activity Tolerance Patient tolerated treatment well    Behavior During Therapy [facility] for tasks assessed/performed             Past Medical History:  Diagnosis Date   Abnormal heart rhythm 10/20/2014   Addison's disease ([place])    Adrenal insufficiency ([place])    Allergy    Ambulates with cane    Anxiety    Arrhythmia    Cataract cortical, senile    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   Chronic kidney disease, stage 3 ([place]) 11/13/2011   Cough 03/29/2014   Edema leg 03/07/2017   Elevated cholesterol with high triglycerides 09/04/2013   Elevated red blood cell count    Erectile dysfunction    Falls frequently    Fever 03/29/2014   Generalized weakness 03/29/2014   GERD (gastroesophageal reflux disease)    Gout 09/04/2013   H/O adrenal insufficiency 05/17/2014   H/O atrial flutter 03/07/2017   H/O atrial flutter    H/O eye surgery    H/O sebaceous cyst 01/06/2018   H/O thyroid disease    H/O upper respiratory infection 10/15/2013   Hearing loss    History of back surgery    History of blepharoplasty    History of cancer 10/04/2015   History of chemotherapy    History of esophagogastroduodenoscopy (EGD)    History of prediabetes    Hyperlipidemia  06/16/2014   Hypertension    Hypertriglyceridemia    Hypothyroidism (acquired) 04/14/2014   Left leg swelling 11/23/2016   Low HDL (under 40)    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[place]) 11/10/2010   Malignant neoplasm of unspecified kidney, except renal pelvis (HCC)    OSA (obstructive sleep apnea) 07/24/2016   OSA (obstructive sleep apnea)    PAF (paroxysmal atrial fibrillation) (HCC)    PAF (paroxysmal atrial fibrillation) ([place])    Pancreatic mass 08/01/2018   Renal carcinoma ([place]) 06/16/2015   RLS (restless legs syndrome) 07/24/2016   Sleep apnea    Testicular hypofunction    Tubular adenoma 10/28/2017   Tussive syncopes 10/15/2013   Type 2 diabetes mellitus ([place])    Vitamin D deficiency disease     Past Surgical History:  Procedure Laterality Date   ADRENALECTOMY     CATARACT EXTRACTION  10/2014   CATARACT EXTRACTION EXTRACAPSULAR  11/05/2016   with Insertion Intraocular Prostheis.    COLONOSCOPY  05/06/2017   with removal lesions by snare.   COLONOSCOPY W/ BIOPSIES     05/06/2017   NEPHRECTOMY     POPLITEAL SYNOVIAL CYST EXCISION     SPINE SURGERY  1989   THYROIDECTOMY     THYROIDECTOMY, PARTIAL  TONSILLECTOMY      There were no vitals filed for this visit.   Subjective Assessment - 08/28/21 0811     Subjective Pt reports no pain currently. Pt reports balance has been off in last two days. He thinks it started after performing his HEP. He denies any dizziness/lightheadedness.    Patient is accompained by: Family member   [name]   Pertinent History Patient is a 75 year old male with referral from Neurology- Dr. Joselyn Arrow for unspecified abnormality of gait. He presents today with his wife and reports > 6 month history of progressive issues with poor balance and multiple falls. Patient has renal Cancer and currently has Pallative care- Was stabilized out of Hospice. Patient presents with the following past medical history: A-Fib and flutter, Lung Cancer,  Obstructive sleep apnea, Upper respiratory infection, Addison's disease, Hypothyroidism, Renal Cancer, DM, gout, arthritis, Chronic kidney disease and Restless leg syndrome. Patient lives with wife in independent living at [facility]. Prior level of function was independent with transfers and mobility using walker yet some assist with dressing.    Limitations Lifting;Standing;Walking;House hold activities    How long can you sit comfortably? No issues    How long can you stand comfortably? <5 min    How long can you walk comfortably? <5 min    Diagnostic tests EXAM:  MRI HEAD WITHOUT CONTRAST     TECHNIQUE:  Multiplanar, multiecho pulse sequences of the brain and surrounding  structures were obtained without intravenous contrast.     COMPARISON:  CT head without contrast 01/20/2021     FINDINGS:  Brain: Moderate generalized atrophy is present. 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 Ventricles are enlarged  bilaterally. There is mild ventricular asymmetry, the right is  larger.     No acute infarct, hemorrhage, or mass lesion is present. No other  significant extra-axial collection is present.     The internal auditory canals are within normal limits. The brainstem  and cerebellum are within normal limits.     Vascular: Flow is present in the major intracranial arteries.     Skull and upper cervical spine: The craniocervical junction is  normal. Upper cervical spine is within normal limits. Marrow signal  is unremarkable.     Sinuses/Orbits: The paranasal sinuses and mastoid air cells are  clear. Bilateral lens replacements are noted. Globes and orbits are  otherwise unremarkable.     IMPRESSION:  1. No acute intracranial abnormality.  2. Moderate generalized atrophy and white matter disease likely  reflects the sequela of chronic microvascular ischemia.  3. Arachnoid cyst over the left convexity.    Patient Stated Goals I would like to walk better and not fall    Currently in Pain? No/denies     Pain Onset In the past 7 days    Pain Onset More than a month ago             INTERVENTIONS: CGA provided throughout and gait belt donned unless otherwise indicated     Heel to toe gait sequencing activity with agility ladder, with  4WW and close CGA x 8 cuing for upright posture, proximity to RW   Seated step tap onto 6" block using 3lb ankle weights x BLE x multiple reps -progressed to standing at 4WW with CGA x 2 rounds; pt rates medium   Sit to stand without UE support (VC and visual demo for correct technique) 1 x 12, 1x8, 1x4 reps. Patient  reports as tiring.    Seated knee ext with 3 lb ankle weights- BLE x 12 reps x 1 set then another set focusing on more eccentric control x 8 reps with 2 sec holds. Pt rates as medium.   Standing marches with 3# ankle weights on at RW 1 x 12, 1x 10 each LE; Pt rates medium; cuing for eccentric control  Seated postural exercises for improved upright posture to decrease fall risk: Seated forward lean followed by upright seated posture and BUE shoulder abduction for additional pec stretch 15x; PT provides VC/TC/demo Seated thoracic extension over chair within pain-free range 15x  Ambulation around clinic gym 2x for focus on safe technique with 4WW and to improve gait mechanics - cuing for large amplitude step with LLE as pt difficulty with L foot drag, reporting LLE feels "stuck" and "signals aren't getting through." Pt exhibits improvement following cuing, must stop to improve proximity to walker.   Education provided throughout session via VC/TC and demonstration to facilitate movement at target joints and correct muscle activation for all testing and exercises performed.      PT Education - 08/28/21 0912     Education Details exercise techinque, body mechnaics, safety with 4ww    Person(s) Educated Patient;Spouse    Methods Explanation;Demonstration;Verbal cues;Tactile cues    Comprehension Returned demonstration;Verbalized  understanding;Verbal cues required;Tactile cues required;Need further instruction              PT Short Term Goals - 08/21/21 0814       PT SHORT TERM GOAL #1   Title Pt will be independent with HEP in order to improve strength and balance in order to decrease fall risk and improve function at home and work.    Baseline 07/21/2021- Paitent presents with no formal HEP in place. 08/21/2021- Patient reports compliance with HEP and states no questions as of today- able to perform standin using his rollator for support.    Time 6    Period Weeks    Status Achieved    Target Date 09/01/21               PT Long Term Goals - 08/21/21 0817       PT LONG TERM GOAL #1   Title Pt will improve FOTO to target score of 53  to display perceived improvements in ability to complete ADL's.    Baseline 07/21/2021= 45    Time 12    Period Weeks    Status New    Target Date 10/13/21      PT LONG TERM GOAL #2   Title Pt will improve BERG by at least 5 points in order to demonstrate clinically significant improvement in balance.    Baseline 07/21/2021= 39/56; 08/21/2021=46/56- Will keep goal active to ensure patient able to test consistently    Time 12    Period Weeks    Status On-going    Target Date 10/13/21      PT LONG TERM GOAL #3   Title Pt will decrease 5TSTS by at least 5 seconds in order to demonstrate clinically significant improvement in LE strength.    Baseline 07/21/2021= 21 sec without UE Support; 11/21= 14.0 sec without UE support-.Will Keep goal active to ensure consistency.    Time 12    Period Weeks    Status On-going    Target Date 10/13/21      PT LONG TERM GOAL #4   Title Pt will decrease TUG to below  18 seconds/decrease  in order to demonstrate decreased fall risk.    Baseline 07/21/2021= 23 sec with 4WW; 08/21/2021= 22.13 sec with 4WW    Time 12    Period Weeks    Status On-going    Target Date 10/13/21      PT LONG TERM GOAL #5   Title Pt will increase  10MWT by at least 0.23 m/s in order to demonstrate clinically significant improvement in community ambulation.    Baseline 07/21/2021= 0.12m/s; 08/21/2021= 0.57 m/s using 4WW    Time 12    Period Weeks    Status On-going    Target Date 10/13/21                   Plan - 08/28/21 5631     Clinical Impression Statement Pt highly motivated to participate in session, but limited most by fatigue, requiring rest intervals thoughout. Focus of session was on improving safe techinque with 4WW as pt exhibits increased forward/anterior lean when ambulating. PT instructed pt through both safe technique with 4WW and also postural exercises to improve upright posture to decrease fall risk. The pt will benefit from further skilled PT to improve balance, strength, gait and safety with ADLs.    Personal Factors and Comorbidities Comorbidity 3+    Comorbidities A- Fib, Renal cancer, Addison's disease, gout, arthritis, DM    Examination-Activity Limitations Caring for Others;Carry;Dressing;Lift;Reach Overhead;Squat;Stairs;Transfers;Stand    Examination-Participation Restrictions Investment banker, operational;Yard Work;Shop    Stability/Clinical Decision Making Evolving/Moderate complexity    Rehab Potential Good    PT Frequency 2x / week    PT Duration 12 weeks    PT Treatment/Interventions ADLs/Self Care Home Management;Canalith Repostioning;Cryotherapy;Electrical Stimulation;Moist Heat;Traction;Ultrasound;DME Instruction;Gait training;Stair training;Functional mobility training;Therapeutic activities;Therapeutic exercise;Balance training;Neuromuscular re-education;Patient/family education;Orthotic Fit/Training;Wheelchair mobility training;Manual techniques;Passive range of motion;Dry needling;Energy conservation;Taping;Vestibular    PT Next Visit Plan Progressive therex to improve tolerance for standing/walking ADL's, contnue POC as previously indicated    PT Home Exercise Plan 08/17/2021-Access  Code: [email or web address]; no updates    Consulted and Agree with Plan of Care Patient;Family member/caregiver    Family Member Consulted WIfe- [name]             Patient will benefit from skilled therapeutic intervention in order to improve the following deficits and impairments:  Abnormal gait, Decreased activity tolerance, Decreased balance, Decreased coordination, Decreased endurance, Decreased knowledge of use of DME, Decreased mobility, Decreased strength, Difficulty walking, Hypomobility, Impaired sensation, Impaired vision/preception, Postural dysfunction, Pain  Visit Diagnosis: Muscle weakness (generalized)  Other abnormalities of gait and mobility  Other lack of coordination  Unsteadiness on feet     Problem List Patient Active Problem List   Diagnosis Date Noted   Addisons disease ([place]) 03/16/2021   Low HDL (under 40) 03/16/2021   Difficulty sleeping 11/21/2020   Numbness and tingling 11/21/2020   Difficulty walking 01/14/2020   History of recent fall 01/14/2020   Sleep disorder 01/14/2020   Pancreatic mass 08/01/2018   H/O sebaceous cyst 01/06/2018   Tubular adenoma 10/28/2017   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   Edema leg 03/07/2017   Hx of atrial flutter 03/07/2017   OSA (obstructive sleep apnea) 07/24/2016   RLS (restless legs syndrome) 07/24/2016   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 [place], [place], [address] Phone: [phone]   Fax:  [phone]  Name: Tony Huffman MRN: [id number] Date of Birth: January 27, 1946

## 2021-09-01 ENCOUNTER — Ambulatory Visit: Payer: Medicare Other | Attending: Neurology

## 2021-09-01 ENCOUNTER — Other Ambulatory Visit: Payer: Self-pay

## 2021-09-01 DIAGNOSIS — M6281 Muscle weakness (generalized): Secondary | ICD-10-CM | POA: Insufficient documentation

## 2021-09-01 DIAGNOSIS — R262 Difficulty in walking, not elsewhere classified: Secondary | ICD-10-CM | POA: Diagnosis present

## 2021-09-01 DIAGNOSIS — R278 Other lack of coordination: Secondary | ICD-10-CM | POA: Diagnosis present

## 2021-09-01 DIAGNOSIS — R269 Unspecified abnormalities of gait and mobility: Secondary | ICD-10-CM | POA: Diagnosis present

## 2021-09-01 DIAGNOSIS — R296 Repeated falls: Secondary | ICD-10-CM | POA: Insufficient documentation

## 2021-09-01 DIAGNOSIS — R2681 Unsteadiness on feet: Secondary | ICD-10-CM | POA: Insufficient documentation

## 2021-09-01 DIAGNOSIS — R2689 Other abnormalities of gait and mobility: Secondary | ICD-10-CM | POA: Diagnosis present

## 2021-09-01 NOTE — Therapy (Signed)
[place] MAIN [facility] SERVICES [address] [place], [place], [address] Phone: [phone]   Fax:  [phone]  Physical Therapy Treatment  Patient Details  Name: Osceola Depaz MRN: [id number] Date of Birth: [birth year] Referring Provider (PT): Dr. Joselyn Arrow   Encounter Date: 09/01/2021   PT End of Session - 09/01/21 0956     Visit Number 13    Number of Visits 25    Date for PT Re-Evaluation 10/13/21    Authorization Type Medicare Part B    Authorization Time Period 07/21/2021- 10/13/2021    Progress Note Due on Visit 10    PT Start Time 0841    PT Stop Time 0928    PT Time Calculation (min) 47 min    Equipment Utilized During Treatment Gait belt;Other (comment)   Left AFO   Activity Tolerance Patient tolerated treatment well    Behavior During Therapy [facility] for tasks assessed/performed             Past Medical History:  Diagnosis Date   Abnormal heart rhythm 10/20/2014   Addison's disease ([place])    Adrenal insufficiency ([place])    Allergy    Ambulates with cane    Anxiety    Arrhythmia    Cataract cortical, senile    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   Chronic kidney disease, stage 3 ([place]) 11/13/2011   Cough 03/29/2014   Edema leg 03/07/2017   Elevated cholesterol with high triglycerides 09/04/2013   Elevated red blood cell count    Erectile dysfunction    Falls frequently    Fever 03/29/2014   Generalized weakness 03/29/2014   GERD (gastroesophageal reflux disease)    Gout 09/04/2013   H/O adrenal insufficiency 05/17/2014   H/O atrial flutter 03/07/2017   H/O atrial flutter    H/O eye surgery    H/O sebaceous cyst 01/06/2018   H/O thyroid disease    H/O upper respiratory infection 10/15/2013   Hearing loss    History of back surgery    History of blepharoplasty    History of cancer 10/04/2015   History of chemotherapy    History of esophagogastroduodenoscopy (EGD)    History of prediabetes    Hyperlipidemia  06/16/2014   Hypertension    Hypertriglyceridemia    Hypothyroidism (acquired) 04/14/2014   Left leg swelling 11/23/2016   Low HDL (under 40)    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[place]) 11/10/2010   Malignant neoplasm of unspecified kidney, except renal pelvis (HCC)    OSA (obstructive sleep apnea) 07/24/2016   OSA (obstructive sleep apnea)    PAF (paroxysmal atrial fibrillation) (HCC)    PAF (paroxysmal atrial fibrillation) ([place])    Pancreatic mass 08/01/2018   Renal carcinoma ([place]) 06/16/2015   RLS (restless legs syndrome) 07/24/2016   Sleep apnea    Testicular hypofunction    Tubular adenoma 10/28/2017   Tussive syncopes 10/15/2013   Type 2 diabetes mellitus ([place])    Vitamin D deficiency disease     Past Surgical History:  Procedure Laterality Date   ADRENALECTOMY     CATARACT EXTRACTION  10/2014   CATARACT EXTRACTION EXTRACAPSULAR  11/05/2016   with Insertion Intraocular Prostheis.    COLONOSCOPY  05/06/2017   with removal lesions by snare.   COLONOSCOPY W/ BIOPSIES     05/06/2017   NEPHRECTOMY     POPLITEAL SYNOVIAL CYST EXCISION     SPINE SURGERY  1989   THYROIDECTOMY     THYROIDECTOMY, PARTIAL  TONSILLECTOMY      There were no vitals filed for this visit.   Subjective Assessment - 09/01/21 0848     Subjective Pt reports feeling rough- "It's been a slow start and I am not my best today.".    Patient is accompained by: Family member   [name]   Pertinent History Patient is a 75 year old male with referral from Neurology- Dr. Joselyn Arrow for unspecified abnormality of gait. He presents today with his wife and reports > 6 month history of progressive issues with poor balance and multiple falls. Patient has renal Cancer and currently has Pallative care- Was stabilized out of Hospice. Patient presents with the following past medical history: A-Fib and flutter, Lung Cancer, Obstructive sleep apnea, Upper respiratory infection, Addison's disease, Hypothyroidism, Renal  Cancer, DM, gout, arthritis, Chronic kidney disease and Restless leg syndrome. Patient lives with wife in independent living at [facility]. Prior level of function was independent with transfers and mobility using walker yet some assist with dressing.    Limitations Lifting;Standing;Walking;House hold activities    How long can you sit comfortably? No issues    How long can you stand comfortably? <5 min    How long can you walk comfortably? <5 min    Diagnostic tests EXAM:  MRI HEAD WITHOUT CONTRAST     TECHNIQUE:  Multiplanar, multiecho pulse sequences of the brain and surrounding  structures were obtained without intravenous contrast.     COMPARISON:  CT head without contrast 01/20/2021     FINDINGS:  Brain: Moderate generalized atrophy is present. 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 Ventricles are enlarged  bilaterally. There is mild ventricular asymmetry, the right is  larger.     No acute infarct, hemorrhage, or mass lesion is present. No other  significant extra-axial collection is present.     The internal auditory canals are within normal limits. The brainstem  and cerebellum are within normal limits.     Vascular: Flow is present in the major intracranial arteries.     Skull and upper cervical spine: The craniocervical junction is  normal. Upper cervical spine is within normal limits. Marrow signal  is unremarkable.     Sinuses/Orbits: The paranasal sinuses and mastoid air cells are  clear. Bilateral lens replacements are noted. Globes and orbits are  otherwise unremarkable.     IMPRESSION:  1. No acute intracranial abnormality.  2. Moderate generalized atrophy and white matter disease likely  reflects the sequela of chronic microvascular ischemia.  3. Arachnoid cyst over the left convexity.    Patient Stated Goals I would like to walk better and not fall    Pain Onset In the past 7 days    Pain Onset More than a month ago            INTERVENTIONS:   Therapeutic  Exercises:   Nustep- L1 LE/UE at seat level 9 for 0.25 mi (1 lap) at 9 min. VC for coordination using BUE/LE and to keep SPM > 50 today. Patient reports at a 4/10 on RPE scale; O2 sat= 95% and HR= 72 bpm.   Heel to toe gait sequencing activity with agility ladder, in // bars and close CGA x 8 cuing for upright posture   U-step walker- ambulation-150 feet- CGA using laser assist - VC to walk on laser line- patient started off well- yet regressed about halfway and towards end struggled to swing Left LE even with cues. Patient responded: "  I felt like a 3 toed sloth who fell out of a tree and laying on the middle of the highway hearing an 90 wheeler coming down the road."  Standing step tap onto 6" step in // bars with BUE support x 10 each LE. Step up onto 6" block in // bars with BUE support x 10 BLE    SIt to stand while holding onto PVC pipe then raising overhead x 10 reps.   80 feet of ambulation using U-step 4WW, CGA, laser assisted - Patient able to initially keep pace and step to line however approx 60 feet in, he begins to drift and drag left LE most likely due to fatigue.    Education provided throughout session via VC/TC and demonstration to facilitate movement at target joints and correct muscle activation for all testing and exercises performed.     Patient arrived not feeling his best but participated well- able to concentrate well with Nustep and step activities. He was challenged with walking yet did respond well with use of U-step walker - able to take a longer step than without it. Next visit will assess 10 MWT with 4WW vs U-step. The pt will benefit from further skilled PT to improve balance, strength, gait and safety with ADLs.                PT Education - 09/01/21 0850     Education Details Exercise technique; safety with mobility    Person(s) Educated Patient    Methods Explanation;Demonstration;Tactile cues;Verbal cues    Comprehension Verbalized  understanding;Returned demonstration;Verbal cues required;Need further instruction              PT Short Term Goals - 08/21/21 0814       PT SHORT TERM GOAL #1   Title Pt will be independent with HEP in order to improve strength and balance in order to decrease fall risk and improve function at home and work.    Baseline 07/21/2021- Paitent presents with no formal HEP in place. 08/21/2021- Patient reports compliance with HEP and states no questions as of today- able to perform standin using his rollator for support.    Time 6    Period Weeks    Status Achieved    Target Date 09/01/21               PT Long Term Goals - 08/21/21 0817       PT LONG TERM GOAL #1   Title Pt will improve FOTO to target score of 53  to display perceived improvements in ability to complete ADL's.    Baseline 07/21/2021= 45    Time 12    Period Weeks    Status New    Target Date 10/13/21      PT LONG TERM GOAL #2   Title Pt will improve BERG by at least 5 points in order to demonstrate clinically significant improvement in balance.    Baseline 07/21/2021= 39/56; 08/21/2021=46/56- Will keep goal active to ensure patient able to test consistently    Time 12    Period Weeks    Status On-going    Target Date 10/13/21      PT LONG TERM GOAL #3   Title Pt will decrease 5TSTS by at least 5 seconds in order to demonstrate clinically significant improvement in LE strength.    Baseline 07/21/2021= 21 sec without UE Support; 11/21= 14.0 sec without UE support-.Will Keep goal active to ensure consistency.    Time 12  Period Weeks    Status On-going    Target Date 10/13/21      PT LONG TERM GOAL #4   Title Pt will decrease TUG to below  18 seconds/decrease in order to demonstrate decreased fall risk.    Baseline 07/21/2021= 23 sec with 4WW; 08/21/2021= 22.13 sec with 4WW    Time 12    Period Weeks    Status On-going    Target Date 10/13/21      PT LONG TERM GOAL #5   Title Pt will increase  10MWT by at least 0.23 m/s in order to demonstrate clinically significant improvement in community ambulation.    Baseline 07/21/2021= 0.57m/s; 08/21/2021= 0.57 m/s using 4WW    Time 12    Period Weeks    Status On-going    Target Date 10/13/21                   Plan - 09/01/21 7416     Clinical Impression Statement Patient arrived not feeling his best but participated well- able to concentrate well with Nustep and step activities. He was challenged with walking yet did respond well with use of U-step walker - able to take a longer step than without it. Next visit will assess 10 MWT with 4WW vs U-step. The pt will benefit from further skilled PT to improve balance, strength, gait and safety with ADLs.    Personal Factors and Comorbidities Comorbidity 3+    Comorbidities A- Fib, Renal cancer, Addison's disease, gout, arthritis, DM    Examination-Activity Limitations Caring for Others;Carry;Dressing;Lift;Reach Overhead;Squat;Stairs;Transfers;Stand    Examination-Participation Restrictions Investment banker, operational;Yard Work;Shop    Stability/Clinical Decision Making Evolving/Moderate complexity    Rehab Potential Good    PT Frequency 2x / week    PT Duration 12 weeks    PT Treatment/Interventions ADLs/Self Care Home Management;Canalith Repostioning;Cryotherapy;Electrical Stimulation;Moist Heat;Traction;Ultrasound;DME Instruction;Gait training;Stair training;Functional mobility training;Therapeutic activities;Therapeutic exercise;Balance training;Neuromuscular re-education;Patient/family education;Orthotic Fit/Training;Wheelchair mobility training;Manual techniques;Passive range of motion;Dry needling;Energy conservation;Taping;Vestibular    PT Next Visit Plan Progressive therex to improve tolerance for standing/walking ADL's, contnue POC as previously indicated    PT Home Exercise Plan 08/17/2021-Access Code: [email or web address]; no updates    Consulted and Agree with Plan of Care  Patient;Family member/caregiver    Family Member Consulted WIfe- [name]             Patient will benefit from skilled therapeutic intervention in order to improve the following deficits and impairments:  Abnormal gait, Decreased activity tolerance, Decreased balance, Decreased coordination, Decreased endurance, Decreased knowledge of use of DME, Decreased mobility, Decreased strength, Difficulty walking, Hypomobility, Impaired sensation, Impaired vision/preception, Postural dysfunction, Pain  Visit Diagnosis: Abnormality of gait and mobility  Difficulty in walking, not elsewhere classified  Muscle weakness (generalized)  Unsteadiness on feet     Problem List Patient Active Problem List   Diagnosis Date Noted   Addisons disease ([place]) 03/16/2021   Low HDL (under 40) 03/16/2021   Difficulty sleeping 11/21/2020   Numbness and tingling 11/21/2020   Difficulty walking 01/14/2020   History of recent fall 01/14/2020   Sleep disorder 01/14/2020   Pancreatic mass 08/01/2018   H/O sebaceous cyst 01/06/2018   Tubular adenoma 10/28/2017   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   Edema leg 03/07/2017   Hx of atrial flutter 03/07/2017   OSA (obstructive sleep apnea) 07/24/2016   RLS (restless legs syndrome) 07/24/2016   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   Atrial fibrillation and flutter ([place]) 10/20/2014   Hyperlipidemia  06/16/2014   Adrenal crisis ([place]) 04/19/2014   Cough 03/29/2014   Weakness 03/29/2014   Cough syncope 10/15/2013   URI (upper respiratory infection) 10/15/2013   Dyslipidemia 09/04/2013   Gout 09/04/2013   Chronic kidney disease, stage III (moderate) ([place]) 11/13/2011   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   Hypothyroidism (acquired) 11/30/2010   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[place]) 11/10/2010    Lewis Moccasin, PT 09/01/2021, 9:57 AM  [place] MAIN [facility] SERVICES [address]  [place], [place], [address] Phone: [phone]   Fax:  [phone]  Name: Bulmaro Feagans MRN: [id number] Date of Birth: 11/12/1945

## 2021-09-04 ENCOUNTER — Ambulatory Visit: Payer: Medicare Other

## 2021-09-04 ENCOUNTER — Other Ambulatory Visit: Payer: Self-pay

## 2021-09-04 DIAGNOSIS — R269 Unspecified abnormalities of gait and mobility: Secondary | ICD-10-CM

## 2021-09-04 DIAGNOSIS — R2681 Unsteadiness on feet: Secondary | ICD-10-CM

## 2021-09-04 DIAGNOSIS — M6281 Muscle weakness (generalized): Secondary | ICD-10-CM

## 2021-09-04 DIAGNOSIS — R262 Difficulty in walking, not elsewhere classified: Secondary | ICD-10-CM

## 2021-09-04 NOTE — Therapy (Signed)
[place] MAIN [facility] SERVICES [address] [place], [place], [address] Phone: [phone]   Fax:  [phone]  Physical Therapy Treatment  Patient Details  Name: Tony Huffman MRN: [id number] Date of Birth: Aug 19, 1946 Referring Provider (PT): Dr. Joselyn Arrow   Encounter Date: 09/04/2021   PT End of Session - 09/04/21 0825     Visit Number 14    Number of Visits 25    Date for PT Re-Evaluation 10/13/21    Authorization Type Medicare Part B    Authorization Time Period 07/21/2021- 10/13/2021    Progress Note Due on Visit 10    PT Start Time 0819    PT Stop Time 0900    PT Time Calculation (min) 41 min    Equipment Utilized During Treatment Gait belt;Other (comment)   Left AFO   Activity Tolerance Patient tolerated treatment well    Behavior During Therapy [facility] for tasks assessed/performed             Past Medical History:  Diagnosis Date   Abnormal heart rhythm 10/20/2014   Addison's disease ([place])    Adrenal insufficiency ([place])    Allergy    Ambulates with cane    Anxiety    Arrhythmia    Cataract cortical, senile    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   Chronic kidney disease, stage 3 ([place]) 11/13/2011   Cough 03/29/2014   Edema leg 03/07/2017   Elevated cholesterol with high triglycerides 09/04/2013   Elevated red blood cell count    Erectile dysfunction    Falls frequently    Fever 03/29/2014   Generalized weakness 03/29/2014   GERD (gastroesophageal reflux disease)    Gout 09/04/2013   H/O adrenal insufficiency 05/17/2014   H/O atrial flutter 03/07/2017   H/O atrial flutter    H/O eye surgery    H/O sebaceous cyst 01/06/2018   H/O thyroid disease    H/O upper respiratory infection 10/15/2013   Hearing loss    History of back surgery    History of blepharoplasty    History of cancer 10/04/2015   History of chemotherapy    History of esophagogastroduodenoscopy (EGD)    History of prediabetes    Hyperlipidemia  06/16/2014   Hypertension    Hypertriglyceridemia    Hypothyroidism (acquired) 04/14/2014   Left leg swelling 11/23/2016   Low HDL (under 40)    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[place]) 11/10/2010   Malignant neoplasm of unspecified kidney, except renal pelvis (HCC)    OSA (obstructive sleep apnea) 07/24/2016   OSA (obstructive sleep apnea)    PAF (paroxysmal atrial fibrillation) (HCC)    PAF (paroxysmal atrial fibrillation) ([place])    Pancreatic mass 08/01/2018   Renal carcinoma ([place]) 06/16/2015   RLS (restless legs syndrome) 07/24/2016   Sleep apnea    Testicular hypofunction    Tubular adenoma 10/28/2017   Tussive syncopes 10/15/2013   Type 2 diabetes mellitus ([place])    Vitamin D deficiency disease     Past Surgical History:  Procedure Laterality Date   ADRENALECTOMY     CATARACT EXTRACTION  10/2014   CATARACT EXTRACTION EXTRACAPSULAR  11/05/2016   with Insertion Intraocular Prostheis.    COLONOSCOPY  05/06/2017   with removal lesions by snare.   COLONOSCOPY W/ BIOPSIES     05/06/2017   NEPHRECTOMY     POPLITEAL SYNOVIAL CYST EXCISION     SPINE SURGERY  1989   THYROIDECTOMY     THYROIDECTOMY, PARTIAL  TONSILLECTOMY      There were no vitals filed for this visit.   Subjective Assessment - 09/04/21 0824     Subjective Patient reports feeling about the same- No new complaints. Denies any falls. Reports having some trouble donning straps for boot this am in waiting room.    Patient is accompained by: Family member   wife - Tony Huffman   Pertinent History Patient is a 75 year old male with referral from Neurology- Dr. Joselyn Arrow for unspecified abnormality of gait. He presents today with his wife and reports > 6 month history of progressive issues with poor balance and multiple falls. Patient has renal Cancer and currently has Pallative care- Was stabilized out of Hospice. Patient presents with the following past medical history: A-Fib and flutter, Lung Cancer, Obstructive sleep  apnea, Upper respiratory infection, Addison's disease, Hypothyroidism, Renal Cancer, DM, gout, arthritis, Chronic kidney disease and Restless leg syndrome. Patient lives with wife in independent living at [facility]. Prior level of function was independent with transfers and mobility using walker yet some assist with dressing.    Limitations Lifting;Standing;Walking;House hold activities    How long can you sit comfortably? No issues    How long can you stand comfortably? <5 min    How long can you walk comfortably? <5 min    Diagnostic tests EXAM:  MRI HEAD WITHOUT CONTRAST     TECHNIQUE:  Multiplanar, multiecho pulse sequences of the brain and surrounding  structures were obtained without intravenous contrast.     COMPARISON:  CT head without contrast 01/20/2021     FINDINGS:  Brain: Moderate generalized atrophy is present. 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 Ventricles are enlarged  bilaterally. There is mild ventricular asymmetry, the right is  larger.     No acute infarct, hemorrhage, or mass lesion is present. No other  significant extra-axial collection is present.     The internal auditory canals are within normal limits. The brainstem  and cerebellum are within normal limits.     Vascular: Flow is present in the major intracranial arteries.     Skull and upper cervical spine: The craniocervical junction is  normal. Upper cervical spine is within normal limits. Marrow signal  is unremarkable.     Sinuses/Orbits: The paranasal sinuses and mastoid air cells are  clear. Bilateral lens replacements are noted. Globes and orbits are  otherwise unremarkable.     IMPRESSION:  1. No acute intracranial abnormality.  2. Moderate generalized atrophy and white matter disease likely  reflects the sequela of chronic microvascular ischemia.  3. Arachnoid cyst over the left convexity.    Patient Stated Goals I would like to walk better and not fall    Currently in Pain? No/denies    Pain Onset In  the past 7 days    Pain Onset More than a month ago               INTERVENTIONS:   Seated therex -hip march BLE x 15 (decreased height and more fatigue exhibited on left LE)  -knee ext BLE x 15 reps- VC/TC to achieve full ROM  Gait training 10 Meter walk test- assessing 4WW vs. U-step walker today:  - 4WW = 24 sec with good reciprocal steps -U-step walker= 27 sec with initially good response to stepping toward laser but fatigued quickly- Difficult to discern if true weakness/fatigue vs. Coordination issue.  After a few min rest break- attempted 2 more trials with U-step  walker and did improve to 24 and lastly 21 steps with VC to take a longer step on left.   Neuromuscular re-education: at support bar  -dynamic marching BLE x 15 reps (minimal step height yet no loss of balance without UE support)   -Lateral Weight shift on airex pad  x 20 reps each direction without UE support.(No LOB)  - Static stand on airex pad x 30 sec with no UE support x 2 trials- NO LOB  - Heel to toe gait activity-  Placed a 1/2 sphere in front of patient for heel strike and patient started with one LE in toe off position while opp LE in stance position (UE holding onto support bar) and instructed to swing leg forward from toe off to heel strike with heel making contact with floor and toe box making contact with sphere to ensure foot clearance - All while swing LE with GTB for resistance x 2 sets of 12 reps.   - Step tap onto cone- BLE x 12 reps- Difficulty lifting Left LE without UE Support onto cone today.   Education provided throughout session via VC/TC and demonstration to facilitate movement at target joints and correct muscle activation for all testing and exercises performed.                           PT Education - 09/04/21 0825     Education Details Exercise technique    Person(s) Educated Patient    Methods Explanation    Comprehension Verbalized  understanding;Returned demonstration;Verbal cues required;Need further instruction              PT Short Term Goals - 08/21/21 0814       PT SHORT TERM GOAL #1   Title Pt will be independent with HEP in order to improve strength and balance in order to decrease fall risk and improve function at home and work.    Baseline 07/21/2021- Paitent presents with no formal HEP in place. 08/21/2021- Patient reports compliance with HEP and states no questions as of today- able to perform standin using his rollator for support.    Time 6    Period Weeks    Status Achieved    Target Date 09/01/21               PT Long Term Goals - 08/21/21 0817       PT LONG TERM GOAL #1   Title Pt will improve FOTO to target score of 53  to display perceived improvements in ability to complete ADL's.    Baseline 07/21/2021= 45    Time 12    Period Weeks    Status New    Target Date 10/13/21      PT LONG TERM GOAL #2   Title Pt will improve BERG by at least 5 points in order to demonstrate clinically significant improvement in balance.    Baseline 07/21/2021= 39/56; 08/21/2021=46/56- Will keep goal active to ensure patient able to test consistently    Time 12    Period Weeks    Status On-going    Target Date 10/13/21      PT LONG TERM GOAL #3   Title Pt will decrease 5TSTS by at least 5 seconds in order to demonstrate clinically significant improvement in LE strength.    Baseline 07/21/2021= 21 sec without UE Support; 11/21= 14.0 sec without UE support-.Will Keep goal active to ensure consistency.    Time 12  Period Weeks    Status On-going    Target Date 10/13/21      PT LONG TERM GOAL #4   Title Pt will decrease TUG to below  18 seconds/decrease in order to demonstrate decreased fall risk.    Baseline 07/21/2021= 23 sec with 4WW; 08/21/2021= 22.13 sec with 4WW    Time 12    Period Weeks    Status On-going    Target Date 10/13/21      PT LONG TERM GOAL #5   Title Pt will increase  10MWT by at least 0.23 m/s in order to demonstrate clinically significant improvement in community ambulation.    Baseline 07/21/2021= 0.80m/s; 08/21/2021= 0.57 m/s using 4WW    Time 12    Period Weeks    Status On-going    Target Date 10/13/21                   Plan - 09/04/21 2706     Clinical Impression Statement Patient performed gait training trying to determine if a U-step walker might be more appropriate. Patient performed both fairy well and no significant advantage with visual cues of laser beam observed. Patient continues to present with more fatigue/weakness in left LE limiting his gait quality. He was well motivated and performed well with balance activities today without any significant LOB. The pt will benefit from further skilled PT to improve balance, strength, gait and safety with ADLs    Personal Factors and Comorbidities Comorbidity 3+    Comorbidities A- Fib, Renal cancer, Addison's disease, gout, arthritis, DM    Examination-Activity Limitations Caring for Others;Carry;Dressing;Lift;Reach Overhead;Squat;Stairs;Transfers;Stand    Examination-Participation Restrictions Community Activity;Cleaning;Driving;Yard Work;Shop    Stability/Clinical Decision Making Evolving/Moderate complexity    Rehab Potential Good    PT Frequency 2x / week    PT Duration 12 weeks    PT Treatment/Interventions ADLs/Self Care Home Management;Canalith Repostioning;Cryotherapy;Electrical Stimulation;Moist Heat;Traction;Ultrasound;DME Instruction;Gait training;Stair training;Functional mobility training;Therapeutic activities;Therapeutic exercise;Balance training;Neuromuscular re-education;Patient/family education;Orthotic Fit/Training;Wheelchair mobility training;Manual techniques;Passive range of motion;Dry needling;Energy conservation;Taping;Vestibular    PT Next Visit Plan Progressive therex to improve tolerance for standing/walking ADL's, contnue POC as previously indicated    PT Home  Exercise Plan 08/17/2021-Access Code: [email or web address]; no updates    Consulted and Agree with Plan of Care Patient;Family member/caregiver    Family Member Consulted WIfe- Tony Huffman             Patient will benefit from skilled therapeutic intervention in order to improve the following deficits and impairments:  Abnormal gait, Decreased activity tolerance, Decreased balance, Decreased coordination, Decreased endurance, Decreased knowledge of use of DME, Decreased mobility, Decreased strength, Difficulty walking, Hypomobility, Impaired sensation, Impaired vision/preception, Postural dysfunction, Pain  Visit Diagnosis: Abnormality of gait and mobility  Difficulty in walking, not elsewhere classified  Muscle weakness (generalized)  Unsteadiness on feet     Problem List Patient Active Problem List   Diagnosis Date Noted   Addisons disease ([place]) 03/16/2021   Low HDL (under 40) 03/16/2021   Difficulty sleeping 11/21/2020   Numbness and tingling 11/21/2020   Difficulty walking 01/14/2020   History of recent fall 01/14/2020   Sleep disorder 01/14/2020   Pancreatic mass 08/01/2018   H/O sebaceous cyst 01/06/2018   Tubular adenoma 10/28/2017   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   Edema leg 03/07/2017   Hx of atrial flutter 03/07/2017   OSA (obstructive sleep apnea) 07/24/2016   RLS (restless legs syndrome) 07/24/2016   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 [place], [place], [address] Phone: [phone]   Fax:  [phone]  Name: Tony Huffman MRN: [id number] Date of Birth: 1945/12/05

## 2021-09-08 ENCOUNTER — Ambulatory Visit: Payer: Medicare Other

## 2021-09-08 ENCOUNTER — Other Ambulatory Visit: Payer: Self-pay

## 2021-09-08 DIAGNOSIS — R262 Difficulty in walking, not elsewhere classified: Secondary | ICD-10-CM

## 2021-09-08 DIAGNOSIS — R269 Unspecified abnormalities of gait and mobility: Secondary | ICD-10-CM

## 2021-09-08 DIAGNOSIS — M6281 Muscle weakness (generalized): Secondary | ICD-10-CM

## 2021-09-08 DIAGNOSIS — R2681 Unsteadiness on feet: Secondary | ICD-10-CM

## 2021-09-08 NOTE — Therapy (Signed)
[place] MAIN [facility] SERVICES [address] [place], [place], [address] Phone: [phone]   Fax:  [phone]  Physical Therapy Treatment  Patient Details  Name: Tony Huffman MRN: [id number] Date of Birth: 01-08-1946 Referring Provider (PT): Dr. Joselyn Arrow   Encounter Date: 09/08/2021   PT End of Session - 09/08/21 0846     Visit Number 15    Number of Visits 25    Date for PT Re-Evaluation 10/13/21    Authorization Type Medicare Part B    Authorization Time Period 07/21/2021- 10/13/2021    Progress Note Due on Visit 10    PT Start Time 0846    PT Stop Time 0932    PT Time Calculation (min) 46 min    Equipment Utilized During Treatment Gait belt;Other (comment)   Left AFO   Activity Tolerance Patient tolerated treatment well    Behavior During Therapy [facility] for tasks assessed/performed             Past Medical History:  Diagnosis Date   Abnormal heart rhythm 10/20/2014   Addison's disease ([place])    Adrenal insufficiency ([place])    Allergy    Ambulates with cane    Anxiety    Arrhythmia    Cataract cortical, senile    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   Chronic kidney disease, stage 3 ([place]) 11/13/2011   Cough 03/29/2014   Edema leg 03/07/2017   Elevated cholesterol with high triglycerides 09/04/2013   Elevated red blood cell count    Erectile dysfunction    Falls frequently    Fever 03/29/2014   Generalized weakness 03/29/2014   GERD (gastroesophageal reflux disease)    Gout 09/04/2013   H/O adrenal insufficiency 05/17/2014   H/O atrial flutter 03/07/2017   H/O atrial flutter    H/O eye surgery    H/O sebaceous cyst 01/06/2018   H/O thyroid disease    H/O upper respiratory infection 10/15/2013   Hearing loss    History of back surgery    History of blepharoplasty    History of cancer 10/04/2015   History of chemotherapy    History of esophagogastroduodenoscopy (EGD)    History of prediabetes    Hyperlipidemia  06/16/2014   Hypertension    Hypertriglyceridemia    Hypothyroidism (acquired) 04/14/2014   Left leg swelling 11/23/2016   Low HDL (under 40)    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[place]) 11/10/2010   Malignant neoplasm of unspecified kidney, except renal pelvis (HCC)    OSA (obstructive sleep apnea) 07/24/2016   OSA (obstructive sleep apnea)    PAF (paroxysmal atrial fibrillation) (HCC)    PAF (paroxysmal atrial fibrillation) ([place])    Pancreatic mass 08/01/2018   Renal carcinoma ([place]) 06/16/2015   RLS (restless legs syndrome) 07/24/2016   Sleep apnea    Testicular hypofunction    Tubular adenoma 10/28/2017   Tussive syncopes 10/15/2013   Type 2 diabetes mellitus ([place])    Vitamin D deficiency disease     Past Surgical History:  Procedure Laterality Date   ADRENALECTOMY     CATARACT EXTRACTION  10/2014   CATARACT EXTRACTION EXTRACAPSULAR  11/05/2016   with Insertion Intraocular Prostheis.    COLONOSCOPY  05/06/2017   with removal lesions by snare.   COLONOSCOPY W/ BIOPSIES     05/06/2017   NEPHRECTOMY     POPLITEAL SYNOVIAL CYST EXCISION     SPINE SURGERY  1989   THYROIDECTOMY     THYROIDECTOMY, PARTIAL  TONSILLECTOMY      There were no vitals filed for this visit.   Subjective Assessment - 09/08/21 0846     Subjective Patient reports this morning was rough- dealing with boot and hopefully will be back in normal shoe next week. He did state he felt like he was walking some better and denies any falls.    Patient is accompained by: Family member   [name]   Pertinent History Patient is a 75 year old male with referral from Neurology- Dr. Joselyn Arrow for unspecified abnormality of gait. He presents today with his wife and reports > 6 month history of progressive issues with poor balance and multiple falls. Patient has renal Cancer and currently has Pallative care- Was stabilized out of Hospice. Patient presents with the following past medical history: A-Fib and flutter,  Lung Cancer, Obstructive sleep apnea, Upper respiratory infection, Addison's disease, Hypothyroidism, Renal Cancer, DM, gout, arthritis, Chronic kidney disease and Restless leg syndrome. Patient lives with wife in independent living at [facility]. Prior level of function was independent with transfers and mobility using walker yet some assist with dressing.    Limitations Lifting;Standing;Walking;House hold activities    How long can you sit comfortably? No issues    How long can you stand comfortably? <5 min    How long can you walk comfortably? <5 min    Diagnostic tests EXAM:  MRI HEAD WITHOUT CONTRAST     TECHNIQUE:  Multiplanar, multiecho pulse sequences of the brain and surrounding  structures were obtained without intravenous contrast.     COMPARISON:  CT head without contrast 01/20/2021     FINDINGS:  Brain: Moderate generalized atrophy is present. 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 Ventricles are enlarged  bilaterally. There is mild ventricular asymmetry, the right is  larger.     No acute infarct, hemorrhage, or mass lesion is present. No other  significant extra-axial collection is present.     The internal auditory canals are within normal limits. The brainstem  and cerebellum are within normal limits.     Vascular: Flow is present in the major intracranial arteries.     Skull and upper cervical spine: The craniocervical junction is  normal. Upper cervical spine is within normal limits. Marrow signal  is unremarkable.     Sinuses/Orbits: The paranasal sinuses and mastoid air cells are  clear. Bilateral lens replacements are noted. Globes and orbits are  otherwise unremarkable.     IMPRESSION:  1. No acute intracranial abnormality.  2. Moderate generalized atrophy and white matter disease likely  reflects the sequela of chronic microvascular ischemia.  3. Arachnoid cyst over the left convexity.    Patient Stated Goals I would like to walk better and not fall    Currently in  Pain? No/denies    Pain Onset --    Pain Onset --              INTERVENTIONS  Seated step tap onto hedgehogs (PT called out color) and patient then tapped his left foot onto corresponding hedgehog x 2 min then performed on right LE. Repeat both legs for sets of 2 min each. *patient with slower response time and mild difficulty coordinating left LE vs. Right but overall improved with practice).   Seated kick ball - PT rolled/kicked ball approx 8 feet toward patient sitting and he was instructed to kick ball back with either leg depending on which side ball rolled to- He demo  good ability to kick with either leg but more cues with Left LE (bend back to start position) x approx 3 min x 2 sets.   -Using Support bar (B UE Support)  Heel to toe gait activity-  Placed a 1/2 foam roll in front of patient for heel strike and patient started with one LE in toe off position while opp LE in stance position and instructed to swing leg forward from toe off to heel strike with heel making contact with floor and toe box making contact with sphere to ensure foot clearance - All while swing LE with GTB for resistance x 2 sets of 12 reps  - Step over 1/2 foam roll with BUE support x 15 reps each LE (min difficulty with increased reps on left LE- couple of times foot did not clear foam)    Gait in gym/hallway- 180 feet with 4WW, CGA with initial Reciprocal gait yet as he fatigued he began dragging left LE more requiring more VC to concentrate and stay close to [employer].   Sit to stand with Right foot placed on blue airex pad to compromise right side and shift power more to left side- x 6 reps with min UE support then 4 more without UE support. Patient reported fatigued upon completion.   Standing with right foot on blue airex pad - performing lateral weight shifting toward Left side to emphasize weight bearing. Patient performed 10 time -stopped due to fatigue.   Education provided throughout session via VC/TC  and demonstration to facilitate movement at target joints and correct muscle activation for all testing and exercises performed.                      PT Education - 09/08/21 0944     Education Details Exercise technique; gait safety education    Person(s) Educated Patient    Methods Explanation;Demonstration;Tactile cues;Verbal cues    Comprehension Verbalized understanding;Returned demonstration;Verbal cues required;Tactile cues required;Need further instruction              PT Short Term Goals - 08/21/21 0814       PT SHORT TERM GOAL #1   Title Pt will be independent with HEP in order to improve strength and balance in order to decrease fall risk and improve function at home and work.    Baseline 07/21/2021- Paitent presents with no formal HEP in place. 08/21/2021- Patient reports compliance with HEP and states no questions as of today- able to perform standin using his rollator for support.    Time 6    Period Weeks    Status Achieved    Target Date 09/01/21               PT Long Term Goals - 08/21/21 0817       PT LONG TERM GOAL #1   Title Pt will improve FOTO to target score of 53  to display perceived improvements in ability to complete ADL's.    Baseline 07/21/2021= 45    Time 12    Period Weeks    Status New    Target Date 10/13/21      PT LONG TERM GOAL #2   Title Pt will improve BERG by at least 5 points in order to demonstrate clinically significant improvement in balance.    Baseline 07/21/2021= 39/56; 08/21/2021=46/56- Will keep goal active to ensure patient able to test consistently    Time 12    Period Weeks    Status  On-going    Target Date 10/13/21      PT LONG TERM GOAL #3   Title Pt will decrease 5TSTS by at least 5 seconds in order to demonstrate clinically significant improvement in LE strength.    Baseline 07/21/2021= 21 sec without UE Support; 11/21= 14.0 sec without UE support-.Will Keep goal active to ensure consistency.     Time 12    Period Weeks    Status On-going    Target Date 10/13/21      PT LONG TERM GOAL #4   Title Pt will decrease TUG to below  18 seconds/decrease in order to demonstrate decreased fall risk.    Baseline 07/21/2021= 23 sec with 4WW; 08/21/2021= 22.13 sec with 4WW    Time 12    Period Weeks    Status On-going    Target Date 10/13/21      PT LONG TERM GOAL #5   Title Pt will increase 10MWT by at least 0.23 m/s in order to demonstrate clinically significant improvement in community ambulation.    Baseline 07/21/2021= 0.59m/s; 08/21/2021= 0.57 m/s using 4WW    Time 12    Period Weeks    Status On-going    Target Date 10/13/21                   Plan - 09/08/21 0945     Clinical Impression Statement Patient presented today with good motivation and participation with all activities. He demo improved gait sequencing for longer periods of time vs. Previous sessions and performed better with heel to toe sequencing activities. He continues to present with weakness and fatigue with Left LE as limiting factor. The pt will benefit from further skilled PT to improve balance, strength, gait and safety with ADLs    Personal Factors and Comorbidities Comorbidity 3+    Comorbidities A- Fib, Renal cancer, Addison's disease, gout, arthritis, DM    Examination-Activity Limitations Caring for Others;Carry;Dressing;Lift;Reach Overhead;Squat;Stairs;Transfers;Stand    Examination-Participation Restrictions Community Activity;Cleaning;Driving;Yard Work;Shop    Stability/Clinical Decision Making Evolving/Moderate complexity    Rehab Potential Good    PT Frequency 2x / week    PT Duration 12 weeks    PT Treatment/Interventions ADLs/Self Care Home Management;Canalith Repostioning;Cryotherapy;Electrical Stimulation;Moist Heat;Traction;Ultrasound;DME Instruction;Gait training;Stair training;Functional mobility training;Therapeutic activities;Therapeutic exercise;Balance training;Neuromuscular  re-education;Patient/family education;Orthotic Fit/Training;Wheelchair mobility training;Manual techniques;Passive range of motion;Dry needling;Energy conservation;Taping;Vestibular    PT Next Visit Plan Progressive therex to improve tolerance for standing/walking ADL's, contnue POC as previously indicated    PT Home Exercise Plan 08/17/2021-Access Code: [email or web address]; no updates    Consulted and Agree with Plan of Care Patient;Family member/caregiver    Family Member Consulted WIfe- [name]             Patient will benefit from skilled therapeutic intervention in order to improve the following deficits and impairments:  Abnormal gait, Decreased activity tolerance, Decreased balance, Decreased coordination, Decreased endurance, Decreased knowledge of use of DME, Decreased mobility, Decreased strength, Difficulty walking, Hypomobility, Impaired sensation, Impaired vision/preception, Postural dysfunction, Pain  Visit Diagnosis: Abnormality of gait and mobility  Difficulty in walking, not elsewhere classified  Muscle weakness (generalized)  Unsteadiness on feet     Problem List Patient Active Problem List   Diagnosis Date Noted   Addisons disease ([place]) 03/16/2021   Low HDL (under 40) 03/16/2021   Difficulty sleeping 11/21/2020   Numbness and tingling 11/21/2020   Difficulty walking 01/14/2020   History of recent fall 01/14/2020   Sleep disorder 01/14/2020   Pancreatic mass 08/01/2018  H/O sebaceous cyst 01/06/2018   Tubular adenoma 10/28/2017   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   Edema leg 03/07/2017   Hx of atrial flutter 03/07/2017   OSA (obstructive sleep apnea) 07/24/2016   RLS (restless legs syndrome) 07/24/2016   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 [place], [place], [address] Phone: [phone]   Fax:  [phone]  Name: Tony Huffman MRN: [id number] Date of Birth: 1945/10/06

## 2021-09-11 ENCOUNTER — Other Ambulatory Visit: Payer: Self-pay

## 2021-09-11 ENCOUNTER — Ambulatory Visit: Payer: Medicare Other

## 2021-09-11 DIAGNOSIS — R2689 Other abnormalities of gait and mobility: Secondary | ICD-10-CM

## 2021-09-11 DIAGNOSIS — R262 Difficulty in walking, not elsewhere classified: Secondary | ICD-10-CM

## 2021-09-11 DIAGNOSIS — R2681 Unsteadiness on feet: Secondary | ICD-10-CM

## 2021-09-11 DIAGNOSIS — R269 Unspecified abnormalities of gait and mobility: Secondary | ICD-10-CM | POA: Diagnosis not present

## 2021-09-11 DIAGNOSIS — R278 Other lack of coordination: Secondary | ICD-10-CM

## 2021-09-11 DIAGNOSIS — R296 Repeated falls: Secondary | ICD-10-CM

## 2021-09-11 DIAGNOSIS — M6281 Muscle weakness (generalized): Secondary | ICD-10-CM

## 2021-09-11 NOTE — Therapy (Signed)
[place] MAIN [facility] SERVICES [address] [place], [place], [address] Phone: [phone]   Fax:  [phone]  Physical Therapy Treatment  Patient Details  Name: Tony Huffman MRN: [id number] Date of Birth: 06-Jan-1946 Referring Provider (PT): Dr. Joselyn Arrow   Encounter Date: 09/11/2021   PT End of Session - 09/11/21 0831     Visit Number 16    Number of Visits 25    Date for PT Re-Evaluation 10/13/21    Authorization Type Medicare Part B    Authorization Time Period 07/21/2021- 10/13/2021    Progress Note Due on Visit 10    PT Start Time 0819    PT Stop Time 0858    PT Time Calculation (min) 39 min    Equipment Utilized During Treatment Gait belt;Other (comment)    Activity Tolerance Patient tolerated treatment well    Behavior During Therapy [facility] for tasks assessed/performed             Past Medical History:  Diagnosis Date   Abnormal heart rhythm 10/20/2014   Addison's disease ([place])    Adrenal insufficiency ([place])    Allergy    Ambulates with cane    Anxiety    Arrhythmia    Cataract cortical, senile    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   Chronic kidney disease, stage 3 ([place]) 11/13/2011   Cough 03/29/2014   Edema leg 03/07/2017   Elevated cholesterol with high triglycerides 09/04/2013   Elevated red blood cell count    Erectile dysfunction    Falls frequently    Fever 03/29/2014   Generalized weakness 03/29/2014   GERD (gastroesophageal reflux disease)    Gout 09/04/2013   H/O adrenal insufficiency 05/17/2014   H/O atrial flutter 03/07/2017   H/O atrial flutter    H/O eye surgery    H/O sebaceous cyst 01/06/2018   H/O thyroid disease    H/O upper respiratory infection 10/15/2013   Hearing loss    History of back surgery    History of blepharoplasty    History of cancer 10/04/2015   History of chemotherapy    History of esophagogastroduodenoscopy (EGD)    History of prediabetes    Hyperlipidemia 06/16/2014    Hypertension    Hypertriglyceridemia    Hypothyroidism (acquired) 04/14/2014   Left leg swelling 11/23/2016   Low HDL (under 40)    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[place]) 11/10/2010   Malignant neoplasm of unspecified kidney, except renal pelvis (HCC)    OSA (obstructive sleep apnea) 07/24/2016   OSA (obstructive sleep apnea)    PAF (paroxysmal atrial fibrillation) (HCC)    PAF (paroxysmal atrial fibrillation) ([place])    Pancreatic mass 08/01/2018   Renal carcinoma ([place]) 06/16/2015   RLS (restless legs syndrome) 07/24/2016   Sleep apnea    Testicular hypofunction    Tubular adenoma 10/28/2017   Tussive syncopes 10/15/2013   Type 2 diabetes mellitus ([place])    Vitamin D deficiency disease     Past Surgical History:  Procedure Laterality Date   ADRENALECTOMY     CATARACT EXTRACTION  10/2014   CATARACT EXTRACTION EXTRACAPSULAR  11/05/2016   with Insertion Intraocular Prostheis.    COLONOSCOPY  05/06/2017   with removal lesions by snare.   COLONOSCOPY W/ BIOPSIES     05/06/2017   NEPHRECTOMY     POPLITEAL SYNOVIAL CYST EXCISION     SPINE SURGERY  1989   THYROIDECTOMY     THYROIDECTOMY, PARTIAL     TONSILLECTOMY  There were no vitals filed for this visit.   Subjective Assessment - 09/11/21 0824     Subjective Pt and wife arrive for PT, no CAM rocker today due to insufficicent time/frustration. Pt also not in AFO today. Pt follows up with doctor Posey Pronto with podiatry regarding the achilles swelling on 12/15.    Pertinent History Patient is a 75 year old male with referral from Neurology- Dr. Joselyn Arrow for unspecified abnormality of gait. He presents today with his wife and reports > 6 month history of progressive issues with poor balance and multiple falls. Patient has renal Cancer and currently has Pallative care- Was stabilized out of Hospice. Patient presents with the following past medical history: A-Fib and flutter, Lung Cancer, Obstructive sleep apnea, Upper respiratory  infection, Addison's disease, Hypothyroidism, Renal Cancer, DM, gout, arthritis, Chronic kidney disease and Restless leg syndrome. Patient lives with wife in independent living at [facility]. Prior level of function was independent with transfers and mobility using walker yet some assist with dressing.    Currently in Pain? Yes    Pain Score --   Rt shoulder 0/10, hurts when moving           INTERVENTION THIS DATE:   - Side Step over redTB, then progress to 1/2 foam roll with BUE support x 15 reps each LE (min difficulty with increased reps on left LE- -seated LAQ x15  -standing marching 1x15 each side -lateral side steppin g, SUE support on bar -STS 2x6, airex under Rt foot, intermittent RLE  -overground AMB 4x10ft, no device, no ankle devce, cues for LLE clearance; repeat c 2.5lb AW *able to achieve clearance, but has poor limb awareness, and loses clearance with dual task   PT Short Term Goals - 08/21/21 0814       PT SHORT TERM GOAL #1   Title Pt will be independent with HEP in order to improve strength and balance in order to decrease fall risk and improve function at home and work.    Baseline 07/21/2021- Paitent presents with no formal HEP in place. 08/21/2021- Patient reports compliance with HEP and states no questions as of today- able to perform standin using his rollator for support.    Time 6    Period Weeks    Status Achieved    Target Date 09/01/21               PT Long Term Goals - 08/21/21 0817       PT LONG TERM GOAL #1   Title Pt will improve FOTO to target score of 53  to display perceived improvements in ability to complete ADL's.    Baseline 07/21/2021= 45    Time 12    Period Weeks    Status New    Target Date 10/13/21      PT LONG TERM GOAL #2   Title Pt will improve BERG by at least 5 points in order to demonstrate clinically significant improvement in balance.    Baseline 07/21/2021= 39/56; 08/21/2021=46/56- Will keep goal active to ensure  patient able to test consistently    Time 12    Period Weeks    Status On-going    Target Date 10/13/21      PT LONG TERM GOAL #3   Title Pt will decrease 5TSTS by at least 5 seconds in order to demonstrate clinically significant improvement in LE strength.    Baseline 07/21/2021= 21 sec without UE Support; 11/21= 14.0 sec without UE support-.Will  Keep goal active to ensure consistency.    Time 12    Period Weeks    Status On-going    Target Date 10/13/21      PT LONG TERM GOAL #4   Title Pt will decrease TUG to below  18 seconds/decrease in order to demonstrate decreased fall risk.    Baseline 07/21/2021= 23 sec with 4WW; 08/21/2021= 22.13 sec with 4WW    Time 12    Period Weeks    Status On-going    Target Date 10/13/21      PT LONG TERM GOAL #5   Title Pt will increase 10MWT by at least 0.23 m/s in order to demonstrate clinically significant improvement in community ambulation.    Baseline 07/21/2021= 0.31m/s; 08/21/2021= 0.57 m/s using 4WW    Time 12    Period Weeks    Status On-going    Target Date 10/13/21                   Plan - 09/11/21 0838     Clinical Impression Statement Patient presented today with good motivation and participation with all activities. Rests provided as needed. He improved gait sequencing for longer periods of time compared to previous sessions and performed better with heel to toe sequencing activities. He continues to present with weakness and fatigue with Left LE as limiting factor. The pt will benefit from further skilled PT to improve balance, strength, gait and safety with ADLs.   Personal Factors and Comorbidities Comorbidity 3+    Comorbidities A- Fib, Renal cancer, Addison's disease, gout, arthritis, DM    Examination-Activity Limitations Caring for Others;Carry;Dressing;Lift;Reach Overhead;Squat;Stairs;Transfers;Stand    Examination-Participation Restrictions Investment banker, operational;Yard Work;Shop     Stability/Clinical Decision Making Evolving/Moderate complexity    Clinical Decision Making Moderate    Rehab Potential Good    PT Frequency 2x / week    PT Duration 12 weeks    PT Treatment/Interventions ADLs/Self Care Home Management;Canalith Repostioning;Cryotherapy;Electrical Stimulation;Moist Heat;Traction;Ultrasound;DME Instruction;Gait training;Stair training;Functional mobility training;Therapeutic activities;Therapeutic exercise;Balance training;Neuromuscular re-education;Patient/family education;Orthotic Fit/Training;Wheelchair mobility training;Manual techniques;Passive range of motion;Dry needling;Energy conservation;Taping;Vestibular    PT Next Visit Plan Progressive therex to improve tolerance for standing/walking ADL's, contnue POC as previously indicated    PT Home Exercise Plan 08/17/2021-Access Code: [email or web address]; no updates    Consulted and Agree with Plan of Care Patient;Family member/caregiver    Family Member Consulted WIfe- [name]             Patient will benefit from skilled therapeutic intervention in order to improve the following deficits and impairments:  Abnormal gait, Decreased activity tolerance, Decreased balance, Decreased coordination, Decreased endurance, Decreased knowledge of use of DME, Decreased mobility, Decreased strength, Difficulty walking, Hypomobility, Impaired sensation, Impaired vision/preception, Postural dysfunction, Pain  Visit Diagnosis: Abnormality of gait and mobility  Difficulty in walking, not elsewhere classified  Muscle weakness (generalized)  Unsteadiness on feet  Other abnormalities of gait and mobility  Other lack of coordination  Repeated falls     Problem List Patient Active Problem List   Diagnosis Date Noted   Addisons disease ([place]) 03/16/2021   Low HDL (under 40) 03/16/2021   Difficulty sleeping 11/21/2020   Numbness and tingling 11/21/2020   Difficulty walking 01/14/2020   History of recent fall 01/14/2020    Sleep disorder 01/14/2020   Pancreatic mass 08/01/2018   H/O sebaceous cyst 01/06/2018   Tubular adenoma 10/28/2017   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   Edema leg 03/07/2017   Hx of atrial flutter 03/07/2017  OSA (obstructive sleep apnea) 07/24/2016   RLS (restless legs syndrome) 07/24/2016   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   Atrial fibrillation and flutter ([place]) 10/20/2014   Hyperlipidemia 06/16/2014   Adrenal crisis ([place]) 04/19/2014   Cough 03/29/2014   Weakness 03/29/2014   Cough syncope 10/15/2013   URI (upper respiratory infection) 10/15/2013   Dyslipidemia 09/04/2013   Gout 09/04/2013   Chronic kidney disease, stage III (moderate) ([place]) 11/13/2011   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   Hypothyroidism (acquired) 11/30/2010   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[place]) 11/10/2010   9:04 AM, 09/11/21 Etta Grandchild, PT, DPT Physical Therapist - [facility]  Outpatient Physical Therapy- [place] [phone]      [place], PT 09/11/2021, 8:44 AM  [place] MAIN [facility] SERVICES [address] [place], [place], [address] Phone: [phone]   Fax:  [phone]  Name: Tony Huffman MRN: [id number] Date of Birth: 1946/07/24

## 2021-09-12 ENCOUNTER — Other Ambulatory Visit: Payer: Self-pay

## 2021-09-12 ENCOUNTER — Other Ambulatory Visit: Payer: Medicare Other | Admitting: Student

## 2021-09-12 DIAGNOSIS — C649 Malignant neoplasm of unspecified kidney, except renal pelvis: Secondary | ICD-10-CM

## 2021-09-12 DIAGNOSIS — R262 Difficulty in walking, not elsewhere classified: Secondary | ICD-10-CM

## 2021-09-12 DIAGNOSIS — Z515 Encounter for palliative care: Secondary | ICD-10-CM

## 2021-09-12 NOTE — Progress Notes (Signed)
[employer] Palliative Care Consult Note Telephone: [phone]  Fax: [phone]    Date of encounter: 09/12/21 9:47 AM PATIENT NAME: Tony Huffman [address] [phone]   [phone] (home)  DOB: [birth year] MRN: [id number] PRIMARY CARE PROVIDER:    [name], NP,  [place] [address] [phone]  REFERRING PROVIDER:   [name], NP [place],  [place] [address] [phone]  RESPONSIBLE PARTY:    Contact Information     Name Relation Home Work [place], [place]   [phone]        I met face to face with patient and family in the home. Palliative Care was asked to follow this patient by consultation request of  [name], NP to address advance care planning and complex medical decision making. This is a follow up visit.                                   ASSESSMENT AND PLAN / RECOMMENDATIONS:   Advance Care Planning/Goals of Care: Goals include to maximize quality of life and symptom management. Our advance care planning conversation included a discussion about:    The value and importance of advance care planning  Experiences with loved ones who have been seriously ill or have died  Exploration of personal, cultural or spiritual beliefs that might influence medical decisions  Exploration of goals of care in the event of a sudden injury or illness  CODE STATUS: DNR  Symptom Management/Plan:  Movement disorder, abnormal gait, imbalance-currently receiving therapy. He feels his gait, balance are improving. Continue therapy as directed. Use walker for ambulation; monitor for falls/safety. Follow up with Neurology as directed.   Metastatic renal carcinoma-wishes to continue on comfort path; patient previously on hospice but discharged due to stability. Education on palliative vs. Hospice. Will refer back to hospice should patient decline.   Follow  up Palliative Care Visit: Palliative care will continue to follow for complex medical decision making, advance care planning, and clarification of goals. Return in 8 weeks or prn.  I spent 40 minutes providing this consultation. More than 50% of the time in this consultation was spent in counseling and care coordination.   PPS: 50%  HOSPICE ELIGIBILITY/DIAGNOSIS: TBD  Chief Complaint: Palliative Medicine follow up visit.   HISTORY OF PRESENT ILLNESS:  Tony Huffman is a 75 y.o. year old male  with metastatic renal cell carcinoma, Addison's disease, polyneuropathy, T2DM, hyperlipidemia, hypertension, atrial fibrillation, vitamin D deficiency, OSA, RLS, chronic gouty arthritis.   Working with physical therapy; feels gait and balance are improving. No recent falls. Endorses a good appetite. Checking blood sugars 3 times a week. Most recent was 12m/dL. He usually has blood sugars 130 or less; he did recently receive a prednisone taper. Sleeping well. A 10-point review of systems is negative, except for the pertinent positives and negatives detailed in the HPI.   History obtained from review of EMR, discussion with primary team, and interview with family, facility staff/caregiver and/or Mr. BDeloatch  I reviewed available labs, medications, imaging, studies and related documents from the EMR.  Records reviewed and summarized above.    Physical Exam: Pulse 80, resp 16, b/p 130/78, sats 95% on room air Constitutional: NAD General: frail appearing EYES: anicteric sclera, lids intact, no discharge  ENMT: intact hearing, oral mucous membranes moist, dentition intact  CV: S1S2, RRR, no LE edema Pulmonary: LCTA, no increased work of breathing, no cough, room air Abdomen: normo-active BS + 4 quadrants, soft and non tender GU: deferred MSK: moves all extremities, ambulatory with walker  Skin: warm and dry, no rashes or wounds on visible skin Neuro: generalized weakness, A & O x 3, forgetful Psych:  non-anxious affect, pleasant Hem/lymph/immuno: no widespread bruising   Thank you for the opportunity to participate in the care of Mr. Melichar.  The palliative care team will continue to follow. Please call our office at [phone] if we can be of additional assistance.   Ezekiel Slocumb, NP   COVID-19 PATIENT SCREENING TOOL Asked and negative response unless otherwise noted:   Have you had symptoms of covid, tested positive or been in contact with someone with symptoms/positive test in the past 5-10 days? No

## 2021-09-14 ENCOUNTER — Ambulatory Visit (INDEPENDENT_AMBULATORY_CARE_PROVIDER_SITE_OTHER): Payer: Medicare Other | Admitting: Podiatry

## 2021-09-14 ENCOUNTER — Other Ambulatory Visit: Payer: Self-pay

## 2021-09-14 DIAGNOSIS — M7662 Achilles tendinitis, left leg: Secondary | ICD-10-CM | POA: Diagnosis not present

## 2021-09-14 NOTE — Progress Notes (Signed)
Subjective:  Patient ID: Tony Huffman, male    DOB: 1946/05/15,  MRN: [id number]  Chief Complaint  Patient presents with   Foot Pain    Pt stated that his foot is doing better     75 y.o. male presents with the above complaint.  Patient presents with follow-up of left posterior Achilles tendinitis.  He states that is doing better.  The cam boot immobilization helped a lot.  He denies any other acute complaints   Review of Systems: Negative except as noted in the HPI. Denies N/V/F/Ch.  Past Medical History:  Diagnosis Date   Abnormal heart rhythm 10/20/2014   Addison's disease ([place])    Adrenal insufficiency (HCC)    Allergy    Ambulates with cane    Anxiety    Arrhythmia    Cataract cortical, senile    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   Chronic kidney disease, stage 3 ([place]) 11/13/2011   Cough 03/29/2014   Edema leg 03/07/2017   Elevated cholesterol with high triglycerides 09/04/2013   Elevated red blood cell count    Erectile dysfunction    Falls frequently    Fever 03/29/2014   Generalized weakness 03/29/2014   GERD (gastroesophageal reflux disease)    Gout 09/04/2013   H/O adrenal insufficiency 05/17/2014   H/O atrial flutter 03/07/2017   H/O atrial flutter    H/O eye surgery    H/O sebaceous cyst 01/06/2018   H/O thyroid disease    H/O upper respiratory infection 10/15/2013   Hearing loss    History of back surgery    History of blepharoplasty    History of cancer 10/04/2015   History of chemotherapy    History of esophagogastroduodenoscopy (EGD)    History of prediabetes    Hyperlipidemia 06/16/2014   Hypertension    Hypertriglyceridemia    Hypothyroidism (acquired) 04/14/2014   Left leg swelling 11/23/2016   Low HDL (under 40)    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[place]) 11/10/2010   Malignant neoplasm of unspecified kidney, except renal pelvis (HCC)    OSA (obstructive sleep apnea) 07/24/2016   OSA (obstructive sleep apnea)    PAF (paroxysmal atrial  fibrillation) (HCC)    PAF (paroxysmal atrial fibrillation) ([place])    Pancreatic mass 08/01/2018   Renal carcinoma ([place]) 06/16/2015   RLS (restless legs syndrome) 07/24/2016   Sleep apnea    Testicular hypofunction    Tubular adenoma 10/28/2017   Tussive syncopes 10/15/2013   Type 2 diabetes mellitus ([place])    Vitamin D deficiency disease     Current Outpatient Medications:    Accu-Chek Softclix Lancets lancets, Use to check blood sugar once daily. Dx: E11.49, Disp: 100 each, Rfl: 3   blood glucose meter kit and supplies, 1 each by Other route as directed. Dispense based on patient and insurance preference. Use up to four times daily as directed. (FOR ICD-10 E10.9, E11.9)., Disp: , Rfl:    Cholecalciferol 125 MCG (5000 UT) capsule, Take 5,000 Units by mouth daily., Disp: , Rfl:    colchicine 0.6 MG tablet, Take 1 tablet (0.6 mg total) by mouth as needed. First sign of Gout Flare. Take 2 tablets ( 1.62m) by mouth at first sign of gout flare followed by 1 tablet ( 0.630m after 1 hour. ( max 1.71m71mithin 1 hours), Disp: 180 tablet, Rfl: 1   cyanocobalamin 1000 MCG tablet, Take 1,000 mcg by mouth every other day., Disp: , Rfl:    Fludrocortisone Acetate (FLORINEF PO), Take 0.1 mg by  mouth every other day. On alternate days take 1/2 tablet., Disp: , Rfl:    furosemide (LASIX) 20 MG tablet, Take 20 mg by mouth as needed. For Edema., Disp: , Rfl:    gabapentin (NEURONTIN) 600 MG tablet, Take 900 mg by mouth daily. Takes 1 and 1/2 tablet by mouth nightly., Disp: , Rfl:    glucose blood (ACCU-CHEK AVIVA PLUS) test strip, Use to test blood sugar once daily. Dx: E11.49, Disp: 100 each, Rfl: 3   hydrocortisone (CORTEF) 10 MG tablet, Take 10 mg by mouth daily. Takes 1 and 1/2 tablet every morning, 1/2 tablet every afternoon, and 1/2 tablet every evening. Take double dose as directed for stress, may take up to 85 tablets monthly., Disp: , Rfl:    levothyroxine (SYNTHROID) 75 MCG tablet, Take 75 mcg by mouth  daily., Disp: , Rfl:    lidocaine (XYLOCAINE) 5 % ointment, Apply 1 application topically as needed., Disp: 35.44 g, Rfl: 0   metFORMIN (GLUMETZA) 500 MG (MOD) 24 hr tablet, Take 500 mg by mouth daily., Disp: , Rfl:    Multiple Vitamins-Minerals (PRESERVISION AREDS 2 PO), Take 1 capsule by mouth in the morning and at bedtime., Disp: , Rfl:    nortriptyline (PAMELOR) 10 MG capsule, 2 nightly, Disp: , Rfl:    predniSONE (STERAPRED UNI-PAK 21 TAB) 10 MG (21) TBPK tablet, Use as directed, Disp: 21 tablet, Rfl: 0   PSYLLIUM HUSK PO, Take 3.4 g by mouth every evening., Disp: , Rfl:   Social History   Tobacco Use  Smoking Status Former   Packs/day: 1.00   Years: 10.00   Pack years: 10.00   Types: Cigarettes   Quit date: 1978   Years since quitting: 44.9  Smokeless Tobacco Never    Allergies  Allergen Reactions   Other Rash    Blisters with bandaids Blisters with bandaids    Soap Itching    Any Deodorant Soap.   Tegaderm Ag Mesh [Silver]    Tape Rash    Blisters, rash. Paper tape OK. Blisters, rash. Paper tape OK.    Objective:  There were no vitals filed for this visit. There is no height or weight on file to calculate BMI. Constitutional Well developed. Well nourished.  Vascular Dorsalis pedis pulses palpable bilaterally. Posterior tibial pulses palpable bilaterally. Capillary refill normal to all digits.  No cyanosis or clubbing noted. Pedal hair growth normal.  Neurologic Normal speech. Oriented to person, place, and time. Epicritic sensation to light touch grossly present bilaterally.  Dermatologic Nails well groomed and normal in appearance. No open wounds. No skin lesions.  Orthopedic: No further pain on palpation left posterior Achilles tendon insertion.  No further pain along the course of the Achilles tendon pain with dorsiflexion of the ankle joint no pain with plantarflexion of the ankle joint.  No pain at the posterior tibial tendon, peroneal tendon, ATFL  ligament.  No deep ankle intra-articular pain noted.  Positive Silfverskiold test with gastrocnemius equinus   Radiographs: 3 views of skeletally mature adult left foot: Posterior heel spurring noted plantar spurring noted no other bony abnormalities identified. Assessment:   No diagnosis found.   Plan:  Patient was evaluated and treated and all questions answered.  Left Achilles tendinitis with posterior spurring -Clinically he is doing well.  The cam boot immobilization helped considerably.  I discussed with him to use the brace when he is coming out of the boot.  He states understand will do so. -Heel lifts were dispensed -I  discussed shoe gear modification with him in extensive detail he states understanding. -If any foot and ankle issues arise in future of asked him to come see me.  No follow-ups on file.

## 2021-09-15 ENCOUNTER — Ambulatory Visit: Payer: Medicare Other

## 2021-09-15 DIAGNOSIS — R262 Difficulty in walking, not elsewhere classified: Secondary | ICD-10-CM

## 2021-09-15 DIAGNOSIS — R2681 Unsteadiness on feet: Secondary | ICD-10-CM

## 2021-09-15 DIAGNOSIS — M6281 Muscle weakness (generalized): Secondary | ICD-10-CM

## 2021-09-15 DIAGNOSIS — R269 Unspecified abnormalities of gait and mobility: Secondary | ICD-10-CM | POA: Diagnosis not present

## 2021-09-15 NOTE — Therapy (Signed)
[place] MAIN [facility] SERVICES [address] [place], [place], [address] Phone: [phone]   Fax:  [phone]  Physical Therapy Treatment  Patient Details  Name: Tony Huffman MRN: [id number] Date of Birth: 1945-12-02 Referring Provider (PT): Dr. Joselyn Arrow   Encounter Date: 09/15/2021   PT End of Session - 09/15/21 0919     Visit Number 17    Number of Visits 25    Date for PT Re-Evaluation 10/13/21    Authorization Type Medicare Part B    Authorization Time Period 07/21/2021- 10/13/2021    Progress Note Due on Visit 10    PT Start Time 0845    PT Stop Time 0929    PT Time Calculation (min) 44 min    Equipment Utilized During Treatment Gait belt;Other (comment)    Activity Tolerance Patient tolerated treatment well    Behavior During Therapy [facility] for tasks assessed/performed             Past Medical History:  Diagnosis Date   Abnormal heart rhythm 10/20/2014   Addison's disease ([place])    Adrenal insufficiency ([place])    Allergy    Ambulates with cane    Anxiety    Arrhythmia    Cataract cortical, senile    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   Chronic kidney disease, stage 3 ([place]) 11/13/2011   Cough 03/29/2014   Edema leg 03/07/2017   Elevated cholesterol with high triglycerides 09/04/2013   Elevated red blood cell count    Erectile dysfunction    Falls frequently    Fever 03/29/2014   Generalized weakness 03/29/2014   GERD (gastroesophageal reflux disease)    Gout 09/04/2013   H/O adrenal insufficiency 05/17/2014   H/O atrial flutter 03/07/2017   H/O atrial flutter    H/O eye surgery    H/O sebaceous cyst 01/06/2018   H/O thyroid disease    H/O upper respiratory infection 10/15/2013   Hearing loss    History of back surgery    History of blepharoplasty    History of cancer 10/04/2015   History of chemotherapy    History of esophagogastroduodenoscopy (EGD)    History of prediabetes    Hyperlipidemia 06/16/2014    Hypertension    Hypertriglyceridemia    Hypothyroidism (acquired) 04/14/2014   Left leg swelling 11/23/2016   Low HDL (under 40)    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[place]) 11/10/2010   Malignant neoplasm of unspecified kidney, except renal pelvis (HCC)    OSA (obstructive sleep apnea) 07/24/2016   OSA (obstructive sleep apnea)    PAF (paroxysmal atrial fibrillation) (H[facility])    PAF (paroxysmal atrial fibrillation) ([place])    Pancreatic mass 08/01/2018   Renal carcinoma ([place]) 06/16/2015   RLS (restless legs syndrome) 07/24/2016   Sleep apnea    Testicular hypofunction    Tubular adenoma 10/28/2017   Tussive syncopes 10/15/2013   Type 2 diabetes mellitus ([place])    Vitamin D deficiency disease     Past Surgical History:  Procedure Laterality Date   ADRENALECTOMY     CATARACT EXTRACTION  10/2014   CATARACT EXTRACTION EXTRACAPSULAR  11/05/2016   with Insertion Intraocular Prostheis.    COLONOSCOPY  05/06/2017   with removal lesions by snare.   COLONOSCOPY W/ BIOPSIES     05/06/2017   NEPHRECTOMY     POPLITEAL SYNOVIAL CYST EXCISION     SPINE SURGERY  1989   THYROIDECTOMY     THYROIDECTOMY, PARTIAL     TONSILLECTOMY  There were no vitals filed for this visit.   Subjective Assessment - 09/15/21 0916     Subjective Pt reports to PT stating he is wearing a "new brace" (AFO) on LLE. Pt states Dr. Posey Pronto advised he wear the brace now instead of the boot. Pt denies any achilles pain today. Pt denies any stumbles or falls.    Patient is accompained by: Family member   [name]   Pertinent History Patient is a 75 year old male with referral from Neurology- Dr. Joselyn Arrow for unspecified abnormality of gait. He presents today with his wife and reports > 6 month history of progressive issues with poor balance and multiple falls. Patient has renal Cancer and currently has Pallative care- Was stabilized out of Hospice. Patient presents with the following past medical history: A-Fib and  flutter, Lung Cancer, Obstructive sleep apnea, Upper respiratory infection, Addison's disease, Hypothyroidism, Renal Cancer, DM, gout, arthritis, Chronic kidney disease and Restless leg syndrome. Patient lives with wife in independent living at [facility]. Prior level of function was independent with transfers and mobility using walker yet some assist with dressing.    Limitations Lifting;Standing;Walking;House hold activities    How long can you sit comfortably? No issues    How long can you stand comfortably? <5 min    How long can you walk comfortably? <5 min    Diagnostic tests EXAM:  MRI HEAD WITHOUT CONTRAST     TECHNIQUE:  Multiplanar, multiecho pulse sequences of the brain and surrounding  structures were obtained without intravenous contrast.     COMPARISON:  CT head without contrast 01/20/2021     FINDINGS:  Brain: Moderate generalized atrophy is present. 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 Ventricles are enlarged  bilaterally. There is mild ventricular asymmetry, the right is  larger.     No acute infarct, hemorrhage, or mass lesion is present. No other  significant extra-axial collection is present.     The internal auditory canals are within normal limits. The brainstem  and cerebellum are within normal limits.     Vascular: Flow is present in the major intracranial arteries.     Skull and upper cervical spine: The craniocervical junction is  normal. Upper cervical spine is within normal limits. Marrow signal  is unremarkable.     Sinuses/Orbits: The paranasal sinuses and mastoid air cells are  clear. Bilateral lens replacements are noted. Globes and orbits are  otherwise unremarkable.     IMPRESSION:  1. No acute intracranial abnormality.  2. Moderate generalized atrophy and white matter disease likely  reflects the sequela of chronic microvascular ischemia.  3. Arachnoid cyst over the left convexity.    Patient Stated Goals I would like to walk better and not fall    Pain  Onset In the past 7 days    Pain Onset More than a month ago               INTERVENTION  Nustep for cardiorespiratory and muscular endurance - level 1, 8:18 minutes - 0.25 miles (1 lap) pt perceives as moderate. Cuing for SPMs 50s-60s SPO2 immediately following: 97%, HR: 88 bpm.  Ambulation in // bars with AFO, w/o UE and no AD, close CGA.  - progressed to ambulation around the PT gym approx. 100 ft. Pt displays forward postural lean with absent arm swing. Foot clearance initially improved, but LLE heel drag increases with fatigue.  Alternating step taps on 2" step in // bars  w/o UE support, CGA x 15 reps each LE - progressed to 6" step, initially with BUE support, decreased to w/o UE following 3 repetitions 2 x 15 reps each LE. Pt shows improved upright posture with mirror used as visual cue. Pt challenged by foot clearance with additional reps.  SLS progression: coordinated toe taps on 4 colorful hedgehogs. VC on which color to tap 2 x multiple rounds each LE. Pt utilizes intermittent UE support  Swing phase gait exercise in // bars with LLE on half foam, flat side down x 15 reps each LE. VC on increasing dorsiflexion to improve foot clearance. Pt reports increased unsteadiness standing on the LLE and requires UE support.  Pt educated on focusing on foot clearance with the LLE during swing phase of gait.  Ambulation around the PT gym approx. 100 ft. Pt displays forward postural lean with absent arm swing. Focus on foot clearance on the LLE. Pt improves with VC.    Patient presents today with  excellent motivation and was responsive to all techniques and training. Treatment focused on LE strengthening, heel to toe sequencing, and overall balance. He was able to improve his gait sequencing with better heel to toe sequencing yet fatigues faster. He presents with some neglect on left side as well as fatigue with increased distance and will continue to benefit from further skilled PT to  improve balance, strength, gait and safety with ADLs                   PT Education - 09/15/21 0919     Education Details exercise technique    Person(s) Educated Patient    Methods Explanation;Demonstration;Tactile cues;Verbal cues    Comprehension Verbalized understanding;Returned demonstration;Verbal cues required;Tactile cues required;Need further instruction              PT Short Term Goals - 08/21/21 0814       PT SHORT TERM GOAL #1   Title Pt will be independent with HEP in order to improve strength and balance in order to decrease fall risk and improve function at home and work.    Baseline 07/21/2021- Paitent presents with no formal HEP in place. 08/21/2021- Patient reports compliance with HEP and states no questions as of today- able to perform standin using his rollator for support.    Time 6    Period Weeks    Status Achieved    Target Date 09/01/21               PT Long Term Goals - 08/21/21 0817       PT LONG TERM GOAL #1   Title Pt will improve FOTO to target score of 53  to display perceived improvements in ability to complete ADL's.    Baseline 07/21/2021= 45    Time 12    Period Weeks    Status New    Target Date 10/13/21      PT LONG TERM GOAL #2   Title Pt will improve BERG by at least 5 points in order to demonstrate clinically significant improvement in balance.    Baseline 07/21/2021= 39/56; 08/21/2021=46/56- Will keep goal active to ensure patient able to test consistently    Time 12    Period Weeks    Status On-going    Target Date 10/13/21      PT LONG TERM GOAL #3   Title Pt will decrease 5TSTS by at least 5 seconds in order to demonstrate clinically significant improvement in LE strength.  Baseline 07/21/2021= 21 sec without UE Support; 11/21= 14.0 sec without UE support-.Will Keep goal active to ensure consistency.    Time 12    Period Weeks    Status On-going    Target Date 10/13/21      PT LONG TERM GOAL #4    Title Pt will decrease TUG to below  18 seconds/decrease in order to demonstrate decreased fall risk.    Baseline 07/21/2021= 23 sec with 4WW; 08/21/2021= 22.13 sec with 4WW    Time 12    Period Weeks    Status On-going    Target Date 10/13/21      PT LONG TERM GOAL #5   Title Pt will increase 10MWT by at least 0.23 m/s in order to demonstrate clinically significant improvement in community ambulation.    Baseline 07/21/2021= 0.27m/s; 08/21/2021= 0.57 m/s using 4WW    Time 12    Period Weeks    Status On-going    Target Date 10/13/21                   Plan - 09/15/21 1035     Clinical Impression Statement Patient presents today with  excellent motivation and was responsive to all techniques and training. Treatment focused on LE strengthening, heel to toe sequencing, and overall balance. He was able to improve his gait sequencing with better heel to toe sequencing yet fatigues faster. He presents with some neglect on left side as well as fatigue with increased distance and will continue to benefit from further skilled PT to improve balance, strength, gait and safety with ADLs    Personal Factors and Comorbidities Comorbidity 3+    Comorbidities A- Fib, Renal cancer, Addison's disease, gout, arthritis, DM    Examination-Activity Limitations Caring for Others;Carry;Dressing;Lift;Reach Overhead;Squat;Stairs;Transfers;Stand    Examination-Participation Restrictions Community Activity;Cleaning;Driving;Yard Work;Shop    Stability/Clinical Decision Making Evolving/Moderate complexity    Rehab Potential Good    PT Frequency 2x / week    PT Duration 12 weeks    PT Treatment/Interventions ADLs/Self Care Home Management;Canalith Repostioning;Cryotherapy;Electrical Stimulation;Moist Heat;Traction;Ultrasound;DME Instruction;Gait training;Stair training;Functional mobility training;Therapeutic activities;Therapeutic exercise;Balance training;Neuromuscular re-education;Patient/family  education;Orthotic Fit/Training;Wheelchair mobility training;Manual techniques;Passive range of motion;Dry needling;Energy conservation;Taping;Vestibular    PT Next Visit Plan Progressive therex to improve tolerance for standing/walking ADL's, contnue POC as previously indicated    PT Home Exercise Plan 08/17/2021-Access Code: [email or web address]; no updates    Consulted and Agree with Plan of Care Patient;Family member/caregiver    Family Member Consulted WIfe- [name]             Patient will benefit from skilled therapeutic intervention in order to improve the following deficits and impairments:  Abnormal gait, Decreased activity tolerance, Decreased balance, Decreased coordination, Decreased endurance, Decreased knowledge of use of DME, Decreased mobility, Decreased strength, Difficulty walking, Hypomobility, Impaired sensation, Impaired vision/preception, Postural dysfunction, Pain  Visit Diagnosis: Abnormality of gait and mobility  Difficulty in walking, not elsewhere classified  Unsteadiness on feet  Muscle weakness (generalized)     Problem List Patient Active Problem List   Diagnosis Date Noted   Addisons disease ([place]) 03/16/2021   Low HDL (under 40) 03/16/2021   Difficulty sleeping 11/21/2020   Numbness and tingling 11/21/2020   Difficulty walking 01/14/2020   History of recent fall 01/14/2020   Sleep disorder 01/14/2020   Pancreatic mass 08/01/2018   H/O sebaceous cyst 01/06/2018   Tubular adenoma 10/28/2017   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   Edema leg 03/07/2017   Hx of atrial flutter 03/07/2017  OSA (obstructive sleep apnea) 07/24/2016   RLS (restless legs syndrome) 07/24/2016   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   Atrial fibrillation and flutter ([place]) 10/20/2014   Hyperlipidemia 06/16/2014   Adrenal crisis ([place]) 04/19/2014   Cough 03/29/2014   Weakness 03/29/2014   Cough syncope 10/15/2013   URI (upper respiratory infection) 10/15/2013    Dyslipidemia 09/04/2013   Gout 09/04/2013   Chronic kidney disease, stage III (moderate) ([place]) 11/13/2011   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   Hypothyroidism (acquired) 11/30/2010   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[place]) 11/10/2010    Lewis Moccasin, PT 09/15/2021, 10:59 AM  [place] MAIN [facility] SERVICES [address] [place], [place], [address] Phone: [phone]   Fax:  [phone]  Name: Tony Huffman MRN: [id number] Date of Birth: [birth year]

## 2021-09-18 ENCOUNTER — Other Ambulatory Visit: Payer: Self-pay

## 2021-09-18 ENCOUNTER — Ambulatory Visit: Payer: Medicare Other

## 2021-09-18 DIAGNOSIS — R269 Unspecified abnormalities of gait and mobility: Secondary | ICD-10-CM | POA: Diagnosis not present

## 2021-09-18 DIAGNOSIS — M6281 Muscle weakness (generalized): Secondary | ICD-10-CM

## 2021-09-18 DIAGNOSIS — R262 Difficulty in walking, not elsewhere classified: Secondary | ICD-10-CM

## 2021-09-18 DIAGNOSIS — R2681 Unsteadiness on feet: Secondary | ICD-10-CM

## 2021-09-18 NOTE — Therapy (Signed)
[place] MAIN [facility] SERVICES [address] [place], [place], [address] Phone: [phone]   Fax:  [phone]  Physical Therapy Treatment  Patient Details  Name: Tony Huffman MRN: [id number] Date of Birth: 27-Apr-1946 Referring Provider (PT): Dr. Joselyn Arrow   Encounter Date: 09/18/2021   PT End of Session - 09/18/21 0814     Visit Number 18    Number of Visits 25    Date for PT Re-Evaluation 10/13/21    Authorization Type Medicare Part B    Authorization Time Period 07/21/2021- 10/13/2021    Progress Note Due on Visit 10    PT Start Time 0802    PT Stop Time 0844    PT Time Calculation (min) 42 min    Equipment Utilized During Treatment Gait belt;Other (comment)    Activity Tolerance Patient tolerated treatment well    Behavior During Therapy [facility] for tasks assessed/performed             Past Medical History:  Diagnosis Date   Abnormal heart rhythm 10/20/2014   Addison's disease ([place])    Adrenal insufficiency ([place])    Allergy    Ambulates with cane    Anxiety    Arrhythmia    Cataract cortical, senile    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   Chronic kidney disease, stage 3 ([place]) 11/13/2011   Cough 03/29/2014   Edema leg 03/07/2017   Elevated cholesterol with high triglycerides 09/04/2013   Elevated red blood cell count    Erectile dysfunction    Falls frequently    Fever 03/29/2014   Generalized weakness 03/29/2014   GERD (gastroesophageal reflux disease)    Gout 09/04/2013   H/O adrenal insufficiency 05/17/2014   H/O atrial flutter 03/07/2017   H/O atrial flutter    H/O eye surgery    H/O sebaceous cyst 01/06/2018   H/O thyroid disease    H/O upper respiratory infection 10/15/2013   Hearing loss    History of back surgery    History of blepharoplasty    History of cancer 10/04/2015   History of chemotherapy    History of esophagogastroduodenoscopy (EGD)    History of prediabetes    Hyperlipidemia 06/16/2014    Hypertension    Hypertriglyceridemia    Hypothyroidism (acquired) 04/14/2014   Left leg swelling 11/23/2016   Low HDL (under 40)    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[place]) 11/10/2010   Malignant neoplasm of unspecified kidney, except renal pelvis (HCC)    OSA (obstructive sleep apnea) 07/24/2016   OSA (obstructive sleep apnea)    PAF (paroxysmal atrial fibrillation) (HCC)    PAF (paroxysmal atrial fibrillation) ([place])    Pancreatic mass 08/01/2018   Renal carcinoma ([place]) 06/16/2015   RLS (restless legs syndrome) 07/24/2016   Sleep apnea    Testicular hypofunction    Tubular adenoma 10/28/2017   Tussive syncopes 10/15/2013   Type 2 diabetes mellitus ([place])    Vitamin D deficiency disease     Past Surgical History:  Procedure Laterality Date   ADRENALECTOMY     CATARACT EXTRACTION  10/2014   CATARACT EXTRACTION EXTRACAPSULAR  11/05/2016   with Insertion Intraocular Prostheis.    COLONOSCOPY  05/06/2017   with removal lesions by snare.   COLONOSCOPY W/ BIOPSIES     05/06/2017   NEPHRECTOMY     POPLITEAL SYNOVIAL CYST EXCISION     SPINE SURGERY  1989   THYROIDECTOMY     THYROIDECTOMY, PARTIAL     TONSILLECTOMY  There were no vitals filed for this visit.   Subjective Assessment - 09/18/21 0808     Subjective Pt reports when he walks with his brace he feels like it makes the leg "taller"    Patient is accompained by: Family member   wife - Tony Huffman   Pertinent History Patient is a 75 year old male with referral from Neurology- Dr. Joselyn Arrow for unspecified abnormality of gait. He presents today with his wife and reports > 6 month history of progressive issues with poor balance and multiple falls. Patient has renal Cancer and currently has Pallative care- Was stabilized out of Hospice. Patient presents with the following past medical history: A-Fib and flutter, Lung Cancer, Obstructive sleep apnea, Upper respiratory infection, Addison's disease, Hypothyroidism, Renal Cancer, DM,  gout, arthritis, Chronic kidney disease and Restless leg syndrome. Patient lives with wife in independent living at [facility]. Prior level of function was independent with transfers and mobility using walker yet some assist with dressing.    Limitations Lifting;Standing;Walking;House hold activities    How long can you sit comfortably? No issues    How long can you stand comfortably? <5 min    How long can you walk comfortably? <5 min    Diagnostic tests EXAM:  MRI HEAD WITHOUT CONTRAST     TECHNIQUE:  Multiplanar, multiecho pulse sequences of the brain and surrounding  structures were obtained without intravenous contrast.     COMPARISON:  CT head without contrast 01/20/2021     FINDINGS:  Brain: Moderate generalized atrophy is present. 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 Ventricles are enlarged  bilaterally. There is mild ventricular asymmetry, the right is  larger.     No acute infarct, hemorrhage, or mass lesion is present. No other  significant extra-axial collection is present.     The internal auditory canals are within normal limits. The brainstem  and cerebellum are within normal limits.     Vascular: Flow is present in the major intracranial arteries.     Skull and upper cervical spine: The craniocervical junction is  normal. Upper cervical spine is within normal limits. Marrow signal  is unremarkable.     Sinuses/Orbits: The paranasal sinuses and mastoid air cells are  clear. Bilateral lens replacements are noted. Globes and orbits are  otherwise unremarkable.     IMPRESSION:  1. No acute intracranial abnormality.  2. Moderate generalized atrophy and white matter disease likely  reflects the sequela of chronic microvascular ischemia.  3. Arachnoid cyst over the left convexity.    Patient Stated Goals I would like to walk better and not fall    Currently in Pain? Yes    Pain Score 1     Pain Location Knee    Pain Orientation Right;Left;Anterior    Pain Descriptors /  Indicators Discomfort    Pain Type Acute pain    Pain Onset In the past 7 days    Pain Frequency Constant    Aggravating Factors  Weight bearing, walking    Pain Relieving Factors Rest    Effect of Pain on Daily Activities Difficulty walking    Pain Onset More than a month ago             INTERVENTIONS:   Therapeutic Exercises:   Step tap onto 6" block with UE support -alt LE x 15 reps- Unsteady yet performed well with practice and VC to lift LE as high as possible.   Step up onto 6"  block with UE support - alt LE x 15 reps. Patient able to clear the step block well with each Foot   Side step over orange hurdle with BUE support. VC to take a wider step for obstacle clearance.      Neuromuscular re-ed  -ladder drill in // bars (walking 1 foot per square to facilitate reciprocal steps) x 10 feet down and back for total of 200 feet.  *patient does present   - Ladder drill in // bars (side stepping with both feet into one square) down and back. - Mild unsteadiness- requiring BUE Support of rail.    - Ambulation with cane (Patient states this used to be his preference and states he is knowledgeable in use of cane - on opp side of weakness) 100 feet  and then another 210 feet- Patient able to exhibit initially good reciprocal steps- No LOB- and only began dragging left LE during last 40 feet today. Promptly stopped and had him regroup- and finish and he does improve with completely stopping and catching up with left LE.    Education provided throughout session via VC/TC and demonstration to facilitate movement at target joints and correct muscle activation for all testing and exercises performed.   Patient presents with good motivation today and responsive to all verbal and visual cues today. He was able to step well (reciprocal) with ladder drill and later with use of cane today. He continues to present with some neglect toward end of walk- as well as fatigue. He did well with  completely stopping walking - catching up and then starting over. The pt will benefit from further skilled PT to improve balance, strength, gait and safety with ADLs                PT Education - 09/18/21 0814     Education Details Exercise technique    Person(s) Educated Patient    Methods Explanation;Demonstration;Tactile cues;Verbal cues    Comprehension Verbalized understanding;Returned demonstration;Verbal cues required;Need further instruction;Tactile cues required              PT Short Term Goals - 08/21/21 0814       PT SHORT TERM GOAL #1   Title Pt will be independent with HEP in order to improve strength and balance in order to decrease fall risk and improve function at home and work.    Baseline 07/21/2021- Paitent presents with no formal HEP in place. 08/21/2021- Patient reports compliance with HEP and states no questions as of today- able to perform standin using his rollator for support.    Time 6    Period Weeks    Status Achieved    Target Date 09/01/21               PT Long Term Goals - 08/21/21 0817       PT LONG TERM GOAL #1   Title Pt will improve FOTO to target score of 53  to display perceived improvements in ability to complete ADL's.    Baseline 07/21/2021= 45    Time 12    Period Weeks    Status New    Target Date 10/13/21      PT LONG TERM GOAL #2   Title Pt will improve BERG by at least 5 points in order to demonstrate clinically significant improvement in balance.    Baseline 07/21/2021= 39/56; 08/21/2021=46/56- Will keep goal active to ensure patient able to test consistently    Time 12  Period Weeks    Status On-going    Target Date 10/13/21      PT LONG TERM GOAL #3   Title Pt will decrease 5TSTS by at least 5 seconds in order to demonstrate clinically significant improvement in LE strength.    Baseline 07/21/2021= 21 sec without UE Support; 11/21= 14.0 sec without UE support-.Will Keep goal active to ensure  consistency.    Time 12    Period Weeks    Status On-going    Target Date 10/13/21      PT LONG TERM GOAL #4   Title Pt will decrease TUG to below  18 seconds/decrease in order to demonstrate decreased fall risk.    Baseline 07/21/2021= 23 sec with 4WW; 08/21/2021= 22.13 sec with 4WW    Time 12    Period Weeks    Status On-going    Target Date 10/13/21      PT LONG TERM GOAL #5   Title Pt will increase 10MWT by at least 0.23 m/s in order to demonstrate clinically significant improvement in community ambulation.    Baseline 07/21/2021= 0.46m/s; 08/21/2021= 0.57 m/s using 4WW    Time 12    Period Weeks    Status On-going    Target Date 10/13/21                   Plan - 09/18/21 0815     Clinical Impression Statement Patient presents with good motivation today and responsive to all verbal and visual cues today. He was able to step well (reciprocal) with ladder drill and later with use of cane today. He continues to present with some neglect toward end of walk- as well as fatigue. He did well with completely stopping walking - catching up and then starting over. The pt will benefit from further skilled PT to improve balance, strength, gait and safety with ADLs    Personal Factors and Comorbidities Comorbidity 3+    Comorbidities A- Fib, Renal cancer, Addison's disease, gout, arthritis, DM    Examination-Activity Limitations Caring for Others;Carry;Dressing;Lift;Reach Overhead;Squat;Stairs;Transfers;Stand    Examination-Participation Restrictions Community Activity;Cleaning;Driving;Yard Work;Shop    Stability/Clinical Decision Making Evolving/Moderate complexity    Rehab Potential Good    PT Frequency 2x / week    PT Duration 12 weeks    PT Treatment/Interventions ADLs/Self Care Home Management;Canalith Repostioning;Cryotherapy;Electrical Stimulation;Moist Heat;Traction;Ultrasound;DME Instruction;Gait training;Stair training;Functional mobility training;Therapeutic  activities;Therapeutic exercise;Balance training;Neuromuscular re-education;Patient/family education;Orthotic Fit/Training;Wheelchair mobility training;Manual techniques;Passive range of motion;Dry needling;Energy conservation;Taping;Vestibular    PT Next Visit Plan Progressive therex to improve tolerance for standing/walking ADL's, contnue POC as previously indicated    PT Home Exercise Plan 08/17/2021-Access Code: [email or web address]; no updates    Consulted and Agree with Plan of Care Patient;Family member/caregiver    Family Member Consulted WIfe- Tony Huffman             Patient will benefit from skilled therapeutic intervention in order to improve the following deficits and impairments:  Abnormal gait, Decreased activity tolerance, Decreased balance, Decreased coordination, Decreased endurance, Decreased knowledge of use of DME, Decreased mobility, Decreased strength, Difficulty walking, Hypomobility, Impaired sensation, Impaired vision/preception, Postural dysfunction, Pain  Visit Diagnosis: Abnormality of gait and mobility  Difficulty in walking, not elsewhere classified  Muscle weakness (generalized)  Unsteadiness on feet     Problem List Patient Active Problem List   Diagnosis Date Noted   Addisons disease ([place]) 03/16/2021   Low HDL (under 40) 03/16/2021   Difficulty sleeping 11/21/2020   Numbness and tingling 11/21/2020  Difficulty walking 01/14/2020   History of recent fall 01/14/2020   Sleep disorder 01/14/2020   Pancreatic mass 08/01/2018   H/O sebaceous cyst 01/06/2018   Tubular adenoma 10/28/2017   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   Edema leg 03/07/2017   Hx of atrial flutter 03/07/2017   OSA (obstructive sleep apnea) 07/24/2016   RLS (restless legs syndrome) 07/24/2016   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 SERVICES [address] [place], [place], [address] Phone: [phone]   Fax:  [phone]  Name: Tony Huffman MRN: [id number] Date of Birth: 12-09-1945

## 2021-09-21 ENCOUNTER — Other Ambulatory Visit: Payer: Self-pay

## 2021-09-21 ENCOUNTER — Ambulatory Visit: Payer: Medicare Other

## 2021-09-21 DIAGNOSIS — R269 Unspecified abnormalities of gait and mobility: Secondary | ICD-10-CM | POA: Diagnosis not present

## 2021-09-21 DIAGNOSIS — R262 Difficulty in walking, not elsewhere classified: Secondary | ICD-10-CM

## 2021-09-21 DIAGNOSIS — M6281 Muscle weakness (generalized): Secondary | ICD-10-CM

## 2021-09-21 DIAGNOSIS — R2681 Unsteadiness on feet: Secondary | ICD-10-CM

## 2021-09-21 NOTE — Therapy (Signed)
[place] MAIN [facility] SERVICES [address] [place], [place], [address] Phone: [phone]   Fax:  [phone]  Physical Therapy Treatment  Patient Details  Name: Tony Huffman MRN: [id number] Date of Birth: 02-03-1946 Referring Provider (PT): Dr. Joselyn Arrow   Encounter Date: 09/21/2021   PT End of Session - 09/21/21 0826     Visit Number 19    Number of Visits 25    Date for PT Re-Evaluation 10/13/21    Authorization Type Medicare Part B    Authorization Time Period 07/21/2021- 10/13/2021    Progress Note Due on Visit 10    PT Start Time 0815    PT Stop Time 0859    PT Time Calculation (min) 44 min    Equipment Utilized During Treatment Gait belt;Other (comment)    Activity Tolerance Patient tolerated treatment well    Behavior During Therapy [facility] for tasks assessed/performed             Past Medical History:  Diagnosis Date   Abnormal heart rhythm 10/20/2014   Addison's disease ([place])    Adrenal insufficiency ([place])    Allergy    Ambulates with cane    Anxiety    Arrhythmia    Cataract cortical, senile    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   Chronic kidney disease, stage 3 ([place]) 11/13/2011   Cough 03/29/2014   Edema leg 03/07/2017   Elevated cholesterol with high triglycerides 09/04/2013   Elevated red blood cell count    Erectile dysfunction    Falls frequently    Fever 03/29/2014   Generalized weakness 03/29/2014   GERD (gastroesophageal reflux disease)    Gout 09/04/2013   H/O adrenal insufficiency 05/17/2014   H/O atrial flutter 03/07/2017   H/O atrial flutter    H/O eye surgery    H/O sebaceous cyst 01/06/2018   H/O thyroid disease    H/O upper respiratory infection 10/15/2013   Hearing loss    History of back surgery    History of blepharoplasty    History of cancer 10/04/2015   History of chemotherapy    History of esophagogastroduodenoscopy (EGD)    History of prediabetes    Hyperlipidemia 06/16/2014    Hypertension    Hypertriglyceridemia    Hypothyroidism (acquired) 04/14/2014   Left leg swelling 11/23/2016   Low HDL (under 40)    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[place]) 11/10/2010   Malignant neoplasm of unspecified kidney, except renal pelvis (HCC)    OSA (obstructive sleep apnea) 07/24/2016   OSA (obstructive sleep apnea)    PAF (paroxysmal atrial fibrillation) (HCC)    PAF (paroxysmal atrial fibrillation) ([place])    Pancreatic mass 08/01/2018   Renal carcinoma ([place]) 06/16/2015   RLS (restless legs syndrome) 07/24/2016   Sleep apnea    Testicular hypofunction    Tubular adenoma 10/28/2017   Tussive syncopes 10/15/2013   Type 2 diabetes mellitus ([place])    Vitamin D deficiency disease     Past Surgical History:  Procedure Laterality Date   ADRENALECTOMY     CATARACT EXTRACTION  10/2014   CATARACT EXTRACTION EXTRACAPSULAR  11/05/2016   with Insertion Intraocular Prostheis.    COLONOSCOPY  05/06/2017   with removal lesions by snare.   COLONOSCOPY W/ BIOPSIES     05/06/2017   NEPHRECTOMY     POPLITEAL SYNOVIAL CYST EXCISION     SPINE SURGERY  1989   THYROIDECTOMY     THYROIDECTOMY, PARTIAL     TONSILLECTOMY  There were no vitals filed for this visit.   Subjective Assessment - 09/21/21 0821     Subjective Pt reports having a rough day- "feeling hot" No fever but just a rough morning    Patient is accompained by: Family member   [name]   Pertinent History Patient is a 75 year old male with referral from Neurology- Dr. Joselyn Arrow for unspecified abnormality of gait. He presents today with his wife and reports > 6 month history of progressive issues with poor balance and multiple falls. Patient has renal Cancer and currently has Pallative care- Was stabilized out of Hospice. Patient presents with the following past medical history: A-Fib and flutter, Lung Cancer, Obstructive sleep apnea, Upper respiratory infection, Addison's disease, Hypothyroidism, Renal Cancer, DM,  gout, arthritis, Chronic kidney disease and Restless leg syndrome. Patient lives with wife in independent living at [facility]. Prior level of function was independent with transfers and mobility using walker yet some assist with dressing.    Limitations Lifting;Standing;Walking;House hold activities    How long can you sit comfortably? No issues    How long can you stand comfortably? <5 min    How long can you walk comfortably? <5 min    Diagnostic tests EXAM:  MRI HEAD WITHOUT CONTRAST     TECHNIQUE:  Multiplanar, multiecho pulse sequences of the brain and surrounding  structures were obtained without intravenous contrast.     COMPARISON:  CT head without contrast 01/20/2021     FINDINGS:  Brain: Moderate generalized atrophy is present. 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 Ventricles are enlarged  bilaterally. There is mild ventricular asymmetry, the right is  larger.     No acute infarct, hemorrhage, or mass lesion is present. No other  significant extra-axial collection is present.     The internal auditory canals are within normal limits. The brainstem  and cerebellum are within normal limits.     Vascular: Flow is present in the major intracranial arteries.     Skull and upper cervical spine: The craniocervical junction is  normal. Upper cervical spine is within normal limits. Marrow signal  is unremarkable.     Sinuses/Orbits: The paranasal sinuses and mastoid air cells are  clear. Bilateral lens replacements are noted. Globes and orbits are  otherwise unremarkable.     IMPRESSION:  1. No acute intracranial abnormality.  2. Moderate generalized atrophy and white matter disease likely  reflects the sequela of chronic microvascular ischemia.  3. Arachnoid cyst over the left convexity.    Patient Stated Goals I would like to walk better and not fall    Currently in Pain? No/denies    Pain Onset In the past 7 days    Pain Onset More than a month ago             INTERVENTIONS:    Therapeutic Exercises:   Seated Hip march with 3lb - Focus on eccentric control- x 12 slow reps x 2 sets   Seated knee ext with 3lb- focus on eccentric control x 12 slow reps x 2 sets    Seated Ham curl with GTB 3lb - x12 slowly with Full ROM x 2 sets- VC to take left LE all the way back.   Seated adduction with ball squeeze x 5 sec hold x 10 reps x 2 sets (VC to count out loud)   Seated Hip flex/abd/add up and over hedgehog x 6 on left LE and 12 reps on  right (x 2 sets each)- Patient presents with difficulty clearing hedgehog with left LE   Sit to stand without UE support x 5 reps x 2 sets- VC for positioning and intermittent assist during 1set however patient improved with less cues and no assist for 2nd set.  Education provided throughout session via VC/TC and demonstration to facilitate movement at target joints and correct muscle activation for all testing and exercises performed.                      PT Education - 09/21/21 0825     Education Details Seated LE strengthening exericse technique- Importance of eccentric control    Person(s) Educated Patient    Methods Explanation;Demonstration;Tactile cues;Verbal cues    Comprehension Verbalized understanding;Returned demonstration;Verbal cues required;Tactile cues required;Need further instruction              PT Short Term Goals - 08/21/21 0814       PT SHORT TERM GOAL #1   Title Pt will be independent with HEP in order to improve strength and balance in order to decrease fall risk and improve function at home and work.    Baseline 07/21/2021- Paitent presents with no formal HEP in place. 08/21/2021- Patient reports compliance with HEP and states no questions as of today- able to perform standin using his rollator for support.    Time 6    Period Weeks    Status Achieved    Target Date 09/01/21               PT Long Term Goals - 08/21/21 0817       PT LONG TERM GOAL #1   Title Pt will  improve FOTO to target score of 53  to display perceived improvements in ability to complete ADL's.    Baseline 07/21/2021= 45    Time 12    Period Weeks    Status New    Target Date 10/13/21      PT LONG TERM GOAL #2   Title Pt will improve BERG by at least 5 points in order to demonstrate clinically significant improvement in balance.    Baseline 07/21/2021= 39/56; 08/21/2021=46/56- Will keep goal active to ensure patient able to test consistently    Time 12    Period Weeks    Status On-going    Target Date 10/13/21      PT LONG TERM GOAL #3   Title Pt will decrease 5TSTS by at least 5 seconds in order to demonstrate clinically significant improvement in LE strength.    Baseline 07/21/2021= 21 sec without UE Support; 11/21= 14.0 sec without UE support-.Will Keep goal active to ensure consistency.    Time 12    Period Weeks    Status On-going    Target Date 10/13/21      PT LONG TERM GOAL #4   Title Pt will decrease TUG to below  18 seconds/decrease in order to demonstrate decreased fall risk.    Baseline 07/21/2021= 23 sec with 4WW; 08/21/2021= 22.13 sec with 4WW    Time 12    Period Weeks    Status On-going    Target Date 10/13/21      PT LONG TERM GOAL #5   Title Pt will increase 10MWT by at least 0.23 m/s in order to demonstrate clinically significant improvement in community ambulation.    Baseline 07/21/2021= 0.56m/s; 08/21/2021= 0.57 m/s using 4WW    Time 12    Period Weeks  Status On-going    Target Date 10/13/21                   Plan - 09/21/21 0826     Clinical Impression Statement Treatment focused on LE Strengthening and patient performed well overall. He did exhibit increased fatigue with left LE yet able to complete all activities today. He denied any pain but reports exercises were "medium to hard" and the 2nd set was tough. The pt will benefit from further skilled PT to improve balance, strength, gait and safety with ADLs    Personal Factors  and Comorbidities Comorbidity 3+    Comorbidities A- Fib, Renal cancer, Addison's disease, gout, arthritis, DM    Examination-Activity Limitations Caring for Others;Carry;Dressing;Lift;Reach Overhead;Squat;Stairs;Transfers;Stand    Examination-Participation Restrictions Community Activity;Cleaning;Driving;Yard Work;Shop    Stability/Clinical Decision Making Evolving/Moderate complexity    Rehab Potential Good    PT Frequency 2x / week    PT Duration 12 weeks    PT Treatment/Interventions ADLs/Self Care Home Management;Canalith Repostioning;Cryotherapy;Electrical Stimulation;Moist Heat;Traction;Ultrasound;DME Instruction;Gait training;Stair training;Functional mobility training;Therapeutic activities;Therapeutic exercise;Balance training;Neuromuscular re-education;Patient/family education;Orthotic Fit/Training;Wheelchair mobility training;Manual techniques;Passive range of motion;Dry needling;Energy conservation;Taping;Vestibular    PT Next Visit Plan Progressive therex to improve tolerance for standing/walking ADL's, contnue POC as previously indicated    PT Home Exercise Plan 08/17/2021-Access Code: [email or web address]; no updates    Consulted and Agree with Plan of Care Patient;Family member/caregiver    Family Member Consulted WIfe- [name]             Patient will benefit from skilled therapeutic intervention in order to improve the following deficits and impairments:  Abnormal gait, Decreased activity tolerance, Decreased balance, Decreased coordination, Decreased endurance, Decreased knowledge of use of DME, Decreased mobility, Decreased strength, Difficulty walking, Hypomobility, Impaired sensation, Impaired vision/preception, Postural dysfunction, Pain  Visit Diagnosis: Abnormality of gait and mobility  Difficulty in walking, not elsewhere classified  Muscle weakness (generalized)  Unsteadiness on feet     Problem List Patient Active Problem List   Diagnosis Date Noted   Addisons  disease ([place]) 03/16/2021   Low HDL (under 40) 03/16/2021   Difficulty sleeping 11/21/2020   Numbness and tingling 11/21/2020   Difficulty walking 01/14/2020   History of recent fall 01/14/2020   Sleep disorder 01/14/2020   Pancreatic mass 08/01/2018   H/O sebaceous cyst 01/06/2018   Tubular adenoma 10/28/2017   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   Edema leg 03/07/2017   Hx of atrial flutter 03/07/2017   OSA (obstructive sleep apnea) 07/24/2016   RLS (restless legs syndrome) 07/24/2016   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   Atrial fibrillation and flutter ([place]) 10/20/2014   Hyperlipidemia 06/16/2014   Adrenal crisis ([place]) 04/19/2014   Cough 03/29/2014   Weakness 03/29/2014   Cough syncope 10/15/2013   URI (upper respiratory infection) 10/15/2013   Dyslipidemia 09/04/2013   Gout 09/04/2013   Chronic kidney disease, stage III (moderate) ([place]) 11/13/2011   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   Hypothyroidism (acquired) 11/30/2010   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[place]) 11/10/2010    Lewis Moccasin, PT 09/21/2021, 11:29 AM  [place] MAIN [facility] SERVICES [address] [place], [place], [address] Phone: [phone]   Fax:  [phone]  Name: Tony Huffman MRN: [id number] Date of Birth: 05-26-1946

## 2021-10-05 ENCOUNTER — Ambulatory Visit: Payer: Medicare Other | Attending: Neurology

## 2021-10-05 ENCOUNTER — Other Ambulatory Visit: Payer: Self-pay

## 2021-10-05 DIAGNOSIS — M6281 Muscle weakness (generalized): Secondary | ICD-10-CM | POA: Diagnosis present

## 2021-10-05 DIAGNOSIS — R2681 Unsteadiness on feet: Secondary | ICD-10-CM | POA: Insufficient documentation

## 2021-10-05 DIAGNOSIS — R269 Unspecified abnormalities of gait and mobility: Secondary | ICD-10-CM | POA: Insufficient documentation

## 2021-10-05 DIAGNOSIS — R262 Difficulty in walking, not elsewhere classified: Secondary | ICD-10-CM | POA: Insufficient documentation

## 2021-10-05 NOTE — Therapy (Signed)
[place] [facility] SERVICES [address] [place], [place], [address] Phone: [phone]   Fax:  [phone]  Physical Therapy Treatment/Physical Therapy Progress Note   Dates of reporting period 08/21/2021    to  10/05/2021  Patient Details  Name: Tony Huffman MRN: [id number] Date of Birth: 21-Aug-1946 Referring Provider (PT): Dr. Joselyn Arrow   Encounter Date: 10/05/2021   PT End of Session - 10/05/21 0813     Visit Number 20    Number of Visits 25    Date for PT Re-Evaluation 10/13/21    Authorization Type Medicare Part B    Authorization Time Period 07/21/2021- 10/13/2021    Progress Note Due on Visit 10    PT Start Time 0806    PT Stop Time 0844    PT Time Calculation (min) 38 min    Equipment Utilized During Treatment Gait belt;Other (comment)    Activity Tolerance Patient tolerated treatment well    Behavior During Therapy [facility] for tasks assessed/performed             Past Medical History:  Diagnosis Date   Abnormal heart rhythm 10/20/2014   Addison's disease ([place])    Adrenal insufficiency ([place])    Allergy    Ambulates with cane    Anxiety    Arrhythmia    Cataract cortical, senile    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   Chronic kidney disease, stage 3 ([place]) 11/13/2011   Cough 03/29/2014   Edema leg 03/07/2017   Elevated cholesterol with high triglycerides 09/04/2013   Elevated red blood cell count    Erectile dysfunction    Falls frequently    Fever 03/29/2014   Generalized weakness 03/29/2014   GERD (gastroesophageal reflux disease)    Gout 09/04/2013   H/O adrenal insufficiency 05/17/2014   H/O atrial flutter 03/07/2017   H/O atrial flutter    H/O eye surgery    H/O sebaceous cyst 01/06/2018   H/O thyroid disease    H/O upper respiratory infection 10/15/2013   Hearing loss    History of back surgery    History of blepharoplasty    History of cancer 10/04/2015   History of chemotherapy    History of  esophagogastroduodenoscopy (EGD)    History of prediabetes    Hyperlipidemia 06/16/2014   Hypertension    Hypertriglyceridemia    Hypothyroidism (acquired) 04/14/2014   Left leg swelling 11/23/2016   Low HDL (under 40)    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[place]) 11/10/2010   Malignant neoplasm of unspecified kidney, except renal pelvis (HCC)    OSA (obstructive sleep apnea) 07/24/2016   OSA (obstructive sleep apnea)    PAF (paroxysmal atrial fibrillation) (HCC)    PAF (paroxysmal atrial fibrillation) ([place])    Pancreatic mass 08/01/2018   Renal carcinoma ([place]) 06/16/2015   RLS (restless legs syndrome) 07/24/2016   Sleep apnea    Testicular hypofunction    Tubular adenoma 10/28/2017   Tussive syncopes 10/15/2013   Type 2 diabetes mellitus ([place])    Vitamin D deficiency disease     Past Surgical History:  Procedure Laterality Date   ADRENALECTOMY     CATARACT EXTRACTION  10/2014   CATARACT EXTRACTION EXTRACAPSULAR  11/05/2016   with Insertion Intraocular Prostheis.    COLONOSCOPY  05/06/2017   with removal lesions by snare.   COLONOSCOPY W/ BIOPSIES     05/06/2017   NEPHRECTOMY     POPLITEAL SYNOVIAL CYST EXCISION     SPINE SURGERY  1989   THYROIDECTOMY     THYROIDECTOMY, PARTIAL     TONSILLECTOMY      There were no vitals filed for this visit.   Subjective Assessment - 10/05/21 0811     Subjective Patient reports doing about the same. No falls over the Holidays and denies any pain    Patient is accompained by: Family member   [name]   Pertinent History Patient is a 76 year old male with referral from Neurology- Dr. Joselyn Arrow for unspecified abnormality of gait. He presents today with his wife and reports > 6 month history of progressive issues with poor balance and multiple falls. Patient has renal Cancer and currently has Pallative care- Was stabilized out of Hospice. Patient presents with the following past medical history: A-Fib and flutter, Lung Cancer,  Obstructive sleep apnea, Upper respiratory infection, Addison's disease, Hypothyroidism, Renal Cancer, DM, gout, arthritis, Chronic kidney disease and Restless leg syndrome. Patient lives with wife in independent living at [facility] Prior level of function was independent with transfers and mobility using walker yet some assist with dressing.    Limitations Lifting;Standing;Walking;House hold activities    How long can you sit comfortably? No issues    How long can you stand comfortably? <5 min    How long can you walk comfortably? <5 min    Diagnostic tests EXAM:  MRI HEAD WITHOUT CONTRAST     TECHNIQUE:  Multiplanar, multiecho pulse sequences of the brain and surrounding  structures were obtained without intravenous contrast.     COMPARISON:  CT head without contrast 01/20/2021     FINDINGS:  Brain: Moderate generalized atrophy is present. 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 Ventricles are enlarged  bilaterally. There is mild ventricular asymmetry, the right is  larger.     No acute infarct, hemorrhage, or mass lesion is present. No other  significant extra-axial collection is present.     The internal auditory canals are within normal limits. The brainstem  and cerebellum are within normal limits.     Vascular: Flow is present in the major intracranial arteries.     Skull and upper cervical spine: The craniocervical junction is  normal. Upper cervical spine is within normal limits. Marrow signal  is unremarkable.     Sinuses/Orbits: The paranasal sinuses and mastoid air cells are  clear. Bilateral lens replacements are noted. Globes and orbits are  otherwise unremarkable.     IMPRESSION:  1. No acute intracranial abnormality.  2. Moderate generalized atrophy and white matter disease likely  reflects the sequela of chronic microvascular ischemia.  3. Arachnoid cyst over the left convexity.    Patient Stated Goals I would like to walk better and not fall    Currently in Pain? No/denies     Pain Onset In the past 7 days    Pain Onset More than a month ago                [facility] PT Assessment - 10/05/21 0844       Berg Balance Test   Sit to Stand Able to stand without using hands and stabilize independently    Standing Unsupported Able to stand safely 2 minutes    Sitting with Back Unsupported but Feet Supported on Floor or Stool Able to sit safely and securely 2 minutes    Stand to Sit Sits safely with minimal use of hands    Transfers Able to transfer safely, minor use of  hands    Standing Unsupported with Eyes Closed Able to stand 10 seconds safely    Standing Unsupported with Feet Together Able to place feet together independently and stand 1 minute safely    From Standing, Reach Forward with Outstretched Arm Can reach confidently >25 cm (10")    From Standing Position, Pick up Object from Floor Able to pick up shoe safely and easily    From Standing Position, Turn to Look Behind Over each Shoulder Looks behind from both sides and weight shifts well    Turn 360 Degrees Able to turn 360 degrees safely one side only in 4 seconds or less    Standing Unsupported, Alternately Place Feet on Step/Stool Able to stand independently and safely and complete 8 steps in 20 seconds    Standing Unsupported, One Foot in Front Able to take small step independently and hold 30 seconds    Standing on One Leg Able to lift leg independently and hold 5-10 seconds    Total Score 52           Reassessed all GOALS for progress note:     FOTO= 51 (improved from 45) - GOAL ONGOING  5xSTS= 14.75 sec without UE support  (improved from 21 sec and unchanged from last assessment of 14 sec)  TUG= 20.46 and 18.56 sec = 19.51 sec with 4WW (improved from initial  10 MWT= 17 and 16 sec= 16.5 sec avg= 0.6 m/s avg with 4WW (Improved from 0.57 m/s)    BERG: 52/56 (vastly improved from last measured at 46/56)    Pt educated throughout session about proper posture and technique with  exercises. Improved exercise technique, movement at target joints, use of target muscles after min to mod verbal, visual, tactile cues.                 PT Education - 10/05/21 0813     Education Details Purpose of functional outcome    Person(s) Educated Patient    Methods Explanation;Demonstration;Tactile cues;Verbal cues    Comprehension Verbalized understanding;Returned demonstration;Verbal cues required;Tactile cues required;Need further instruction              PT Short Term Goals - 08/21/21 0814       PT SHORT TERM GOAL #1   Title Pt will be independent with HEP in order to improve strength and balance in order to decrease fall risk and improve function at home and work.    Baseline 07/21/2021- Paitent presents with no formal HEP in place. 08/21/2021- Patient reports compliance with HEP and states no questions as of today- able to perform standin using his rollator for support.    Time 6    Period Weeks    Status Achieved    Target Date 09/01/21               PT Long Term Goals - 10/05/21 1135       PT LONG TERM GOAL #1   Title Pt will improve FOTO to target score of 53  to display perceived improvements in ability to complete ADL's.    Baseline 07/21/2021= 45; 1/5=51    Time 12    Period Weeks    Status On-going    Target Date 10/13/21      PT LONG TERM GOAL #2   Title Pt will improve BERG by at least 5 points in order to demonstrate clinically significant improvement in balance.    Baseline 07/21/2021= 39/56; 08/21/2021=46/56- Will keep goal active to  ensure patient able to test consistently; 10/05/2021= 52/56    Time 12    Period Weeks    Status Achieved    Target Date 10/13/21      PT LONG TERM GOAL #3   Title Pt will decrease 5TSTS by at least 5 seconds in order to demonstrate clinically significant improvement in LE strength.    Baseline 07/21/2021= 21 sec without UE Support; 11/21= 14.0 sec without UE support-.Will Keep goal active to  ensure consistency.  10/05/2021=14.75 sec without UE support  (improved from 21 sec and unchanged from last assessment of 14 sec)    Time 12    Period Weeks    Status Achieved    Target Date 10/13/21      PT LONG TERM GOAL #4   Title Pt will decrease TUG to below  18 seconds/decrease in order to demonstrate decreased fall risk.    Baseline 07/21/2021= 23 sec with 4WW; 08/21/2021= 22.13 sec with 4WW; 10/05/2021= 19.51 sec avg with 4WW    Time 12    Period Weeks    Status On-going    Target Date 10/13/21      PT LONG TERM GOAL #5   Title Pt will increase 10MWT by at least 0.23 m/s in order to demonstrate clinically significant improvement in community ambulation.    Baseline 07/21/2021= 0.74m/s; 08/21/2021= 0.57 m/s using 4WW; 10/05/2021= 0.6 m/s avg using 4WW    Time 12    Period Weeks    Status On-going    Target Date 10/13/21                   Plan - 10/05/21 3151     Clinical Impression Statement Patient was well motivated today and performed well with all functional outcome measure testing today. He was provided brief rest breaks in between activities but demonstrated much improved overall functional mobility today. He exhibited improvement with Timed up and go score, 10 meter walk test speed and with 5 x sit to stand test. He demonstrated significant progress with balance scoring a 52/56. He may be appropriate with new cert coming up next week to add a more dynamic balance test like the FGA or DGI as he continues to have some left LE weakness and difficulty coordinating movement especially once fatigued. Patient's condition has the potential to improve in response to therapy. Maximum improvement is yet to be obtained. The anticipated improvement is attainable and reasonable in a generally predictable time. The pt will benefit from further skilled PT to improve balance, strength, gait and safety with ADLs    Personal Factors and Comorbidities Comorbidity 3+    Comorbidities A- Fib,  Renal cancer, Addison's disease, gout, arthritis, DM    Examination-Activity Limitations Caring for Others;Carry;Dressing;Lift;Reach Overhead;Squat;Stairs;Transfers;Stand    Examination-Participation Restrictions Community Activity;Cleaning;Driving;Yard Work;Shop    Stability/Clinical Decision Making Evolving/Moderate complexity    Rehab Potential Good    PT Frequency 2x / week    PT Duration 12 weeks    PT Treatment/Interventions ADLs/Self Care Home Management;Canalith Repostioning;Cryotherapy;Electrical Stimulation;Moist Heat;Traction;Ultrasound;DME Instruction;Gait training;Stair training;Functional mobility training;Therapeutic activities;Therapeutic exercise;Balance training;Neuromuscular re-education;Patient/family education;Orthotic Fit/Training;Wheelchair mobility training;Manual techniques;Passive range of motion;Dry needling;Energy conservation;Taping;Vestibular    PT Next Visit Plan Progressive therex to improve tolerance for standing/walking ADL's, contnue POC as previously indicated    PT Home Exercise Plan 08/17/2021-Access Code: [email or web address]; no updates    Consulted and Agree with Plan of Care Patient;Family member/caregiver    Family Member Consulted WIfe- [name]  Patient will benefit from skilled therapeutic intervention in order to improve the following deficits and impairments:  Abnormal gait, Decreased activity tolerance, Decreased balance, Decreased coordination, Decreased endurance, Decreased knowledge of use of DME, Decreased mobility, Decreased strength, Difficulty walking, Hypomobility, Impaired sensation, Impaired vision/preception, Postural dysfunction, Pain  Visit Diagnosis: Abnormality of gait and mobility  Difficulty in walking, not elsewhere classified  Muscle weakness (generalized)  Unsteadiness on feet     Problem List Patient Active Problem List   Diagnosis Date Noted   Addisons disease ([place]) 03/16/2021   Low HDL (under 40) 03/16/2021    Difficulty sleeping 11/21/2020   Numbness and tingling 11/21/2020   Difficulty walking 01/14/2020   History of recent fall 01/14/2020   Sleep disorder 01/14/2020   Pancreatic mass 08/01/2018   H/O sebaceous cyst 01/06/2018   Tubular adenoma 10/28/2017   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   Edema leg 03/07/2017   Hx of atrial flutter 03/07/2017   OSA (obstructive sleep apnea) 07/24/2016   RLS (restless legs syndrome) 07/24/2016   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   Atrial fibrillation and flutter ([place]) 10/20/2014   Hyperlipidemia 06/16/2014   Adrenal crisis ([place]) 04/19/2014   Cough 03/29/2014   Weakness 03/29/2014   Cough syncope 10/15/2013   URI (upper respiratory infection) 10/15/2013   Dyslipidemia 09/04/2013   Gout 09/04/2013   Chronic kidney disease, stage III (moderate) ([place]) 11/13/2011   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   Hypothyroidism (acquired) 11/30/2010   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[place]) 11/10/2010    Lewis Moccasin, PT 10/05/2021, 11:53 AM  [place] MAIN [facility] SERVICES [address] [place], [place], [address] Phone: [phone]   Fax:  [phone]  Name: Tony Huffman MRN: [id number] Date of Birth: 22-Jul-1946

## 2021-10-10 ENCOUNTER — Ambulatory Visit: Payer: Medicare Other

## 2021-10-10 ENCOUNTER — Ambulatory Visit: Payer: Medicare Other | Admitting: Nurse Practitioner

## 2021-10-10 ENCOUNTER — Encounter: Payer: Self-pay | Admitting: Nurse Practitioner

## 2021-10-10 ENCOUNTER — Other Ambulatory Visit: Payer: Self-pay

## 2021-10-10 VITALS — BP 110/80 | HR 86 | Temp 98.4°F | Ht 68.0 in | Wt 206.0 lb

## 2021-10-10 DIAGNOSIS — R269 Unspecified abnormalities of gait and mobility: Secondary | ICD-10-CM

## 2021-10-10 DIAGNOSIS — E890 Postprocedural hypothyroidism: Secondary | ICD-10-CM

## 2021-10-10 DIAGNOSIS — R2681 Unsteadiness on feet: Secondary | ICD-10-CM | POA: Diagnosis not present

## 2021-10-10 DIAGNOSIS — M109 Gout, unspecified: Secondary | ICD-10-CM

## 2021-10-10 DIAGNOSIS — R262 Difficulty in walking, not elsewhere classified: Secondary | ICD-10-CM

## 2021-10-10 DIAGNOSIS — G608 Other hereditary and idiopathic neuropathies: Secondary | ICD-10-CM | POA: Diagnosis not present

## 2021-10-10 DIAGNOSIS — G4733 Obstructive sleep apnea (adult) (pediatric): Secondary | ICD-10-CM

## 2021-10-10 DIAGNOSIS — E2740 Unspecified adrenocortical insufficiency: Secondary | ICD-10-CM

## 2021-10-10 DIAGNOSIS — C642 Malignant neoplasm of left kidney, except renal pelvis: Secondary | ICD-10-CM | POA: Diagnosis not present

## 2021-10-10 DIAGNOSIS — M6281 Muscle weakness (generalized): Secondary | ICD-10-CM

## 2021-10-10 DIAGNOSIS — E1149 Type 2 diabetes mellitus with other diabetic neurological complication: Secondary | ICD-10-CM

## 2021-10-10 DIAGNOSIS — Z9989 Dependence on other enabling machines and devices: Secondary | ICD-10-CM

## 2021-10-10 NOTE — Therapy (Signed)
[place] MAIN [facility] SERVICES [address] [place], [place], [address] Phone: [phone]   Fax:  [phone]  Physical Therapy Treatment  Patient Details  Name: Tony Huffman MRN: [id number] Date of Birth: 1946/09/02 Referring Provider (PT): Dr. Joselyn Arrow   Encounter Date: 10/10/2021   PT End of Session - 10/10/21 0813     Visit Number 21    Number of Visits 25    Date for PT Re-Evaluation 10/13/21    Authorization Type Medicare Part B    Authorization Time Period 07/21/2021- 10/13/2021    Progress Note Due on Visit 10    PT Start Time 0804    PT Stop Time 0844    PT Time Calculation (min) 40 min    Equipment Utilized During Treatment Gait belt;Other (comment)    Activity Tolerance Patient tolerated treatment well    Behavior During Therapy [employer] for tasks assessed/performed             Past Medical History:  Diagnosis Date   Abnormal heart rhythm 10/20/2014   Addison's disease ([place])    Adrenal insufficiency ([place])    Allergy    Ambulates with cane    Anxiety    Arrhythmia    Cataract cortical, senile    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   Chronic kidney disease, stage 3 ([place]) 11/13/2011   Cough 03/29/2014   Edema leg 03/07/2017   Elevated cholesterol with high triglycerides 09/04/2013   Elevated red blood cell count    Erectile dysfunction    Falls frequently    Fever 03/29/2014   Generalized weakness 03/29/2014   GERD (gastroesophageal reflux disease)    Gout 09/04/2013   H/O adrenal insufficiency 05/17/2014   H/O atrial flutter 03/07/2017   H/O atrial flutter    H/O eye surgery    H/O sebaceous cyst 01/06/2018   H/O thyroid disease    H/O upper respiratory infection 10/15/2013   Hearing loss    History of back surgery    History of blepharoplasty    History of cancer 10/04/2015   History of chemotherapy    History of esophagogastroduodenoscopy (EGD)    History of prediabetes    Hyperlipidemia 06/16/2014    Hypertension    Hypertriglyceridemia    Hypothyroidism (acquired) 04/14/2014   Left leg swelling 11/23/2016   Low HDL (under 40)    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[place]) 11/10/2010   Malignant neoplasm of unspecified kidney, except renal pelvis (HCC)    OSA (obstructive sleep apnea) 07/24/2016   OSA (obstructive sleep apnea)    PAF (paroxysmal atrial fibrillation) (HCC)    PAF (paroxysmal atrial fibrillation) ([place])    Pancreatic mass 08/01/2018   Renal carcinoma ([place]) 06/16/2015   RLS (restless legs syndrome) 07/24/2016   Sleep apnea    Testicular hypofunction    Tubular adenoma 10/28/2017   Tussive syncopes 10/15/2013   Type 2 diabetes mellitus ([place])    Vitamin D deficiency disease     Past Surgical History:  Procedure Laterality Date   ADRENALECTOMY     CATARACT EXTRACTION  10/2014   CATARACT EXTRACTION EXTRACAPSULAR  11/05/2016   with Insertion Intraocular Prostheis.    COLONOSCOPY  05/06/2017   with removal lesions by snare.   COLONOSCOPY W/ BIOPSIES     05/06/2017   NEPHRECTOMY     POPLITEAL SYNOVIAL CYST EXCISION     SPINE SURGERY  1989   THYROIDECTOMY     THYROIDECTOMY, PARTIAL     TONSILLECTOMY  There were no vitals filed for this visit.   Subjective Assessment - 10/10/21 0809     Subjective Patient reports doing about the same. No falls over the Holidays and denies any pain    Patient is accompained by: Family member   [name]   Pertinent History Patient is a 76 year old male with referral from Neurology- Dr. Joselyn Arrow for unspecified abnormality of gait. He presents today with his wife and reports > 6 month history of progressive issues with poor balance and multiple falls. Patient has renal Cancer and currently has Pallative care- Was stabilized out of Hospice. Patient presents with the following past medical history: A-Fib and flutter, Lung Cancer, Obstructive sleep apnea, Upper respiratory infection, Addison's disease, Hypothyroidism, Renal Cancer,  DM, gout, arthritis, Chronic kidney disease and Restless leg syndrome. Patient lives with wife in independent living at [facility]. Prior level of function was independent with transfers and mobility using walker yet some assist with dressing.    Limitations Lifting;Standing;Walking;House hold activities    How long can you sit comfortably? No issues    How long can you stand comfortably? <5 min    How long can you walk comfortably? <5 min    Diagnostic tests EXAM:  MRI HEAD WITHOUT CONTRAST     TECHNIQUE:  Multiplanar, multiecho pulse sequences of the brain and surrounding  structures were obtained without intravenous contrast.     COMPARISON:  CT head without contrast 01/20/2021     FINDINGS:  Brain: Moderate generalized atrophy is present. 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 Ventricles are enlarged  bilaterally. There is mild ventricular asymmetry, the right is  larger.     No acute infarct, hemorrhage, or mass lesion is present. No other  significant extra-axial collection is present.     The internal auditory canals are within normal limits. The brainstem  and cerebellum are within normal limits.     Vascular: Flow is present in the major intracranial arteries.     Skull and upper cervical spine: The craniocervical junction is  normal. Upper cervical spine is within normal limits. Marrow signal  is unremarkable.     Sinuses/Orbits: The paranasal sinuses and mastoid air cells are  clear. Bilateral lens replacements are noted. Globes and orbits are  otherwise unremarkable.     IMPRESSION:  1. No acute intracranial abnormality.  2. Moderate generalized atrophy and white matter disease likely  reflects the sequela of chronic microvascular ischemia.  3. Arachnoid cyst over the left convexity.    Patient Stated Goals I would like to walk better and not fall    Currently in Pain? Yes    Pain Score 3     Pain Location Scapula    Pain Orientation Right;Posterior    Pain Descriptors /  Indicators Aching    Pain Type Acute pain    Pain Onset In the past 7 days    Pain Frequency Constant    Pain Onset More than a month ago             INTERVENTIONS:   Therapeutic Exercises:   Seated step tap - 2 sets of 12 reps alt LE's- VC to count out loud for proper breathing.   Seated Knee ext- 2 sets of 12 reps alt LE's   Seated hip flex/abd up and over hedgehog  Sit to stand (3 positions- Initially with left LE out in front, Feet together, right LE in front) 3 reps on  1st two position and 4 reps on last one- *increased difficulty with right foot out in front  GAIT TRAINING:   [employer] Training: Right LE off the side of Treadmill and left LE on treadmill belt- CGA with another PT assisting with controlling speed level. Patient was able to ambulate on TM for 2 min  at 0.5mph- Focusing on concentrating on keeping Left LE moving and as much heel to toe. VC to remind patient to keep Left LE moving with varying step length.   Gait with cane- CGA with use of belt. Focused training on consistent gait sequencing. Patient demonstrated initial step throught technique but did begin dragging his left foot intermittently after approx 100 feet - total distance = 175 feet.   Education provided throughout session via VC/TC and demonstration to facilitate movement at target joints and correct muscle activation for all testing and exercises performed.                          PT Education - 10/10/21 0812     Education Details Exercise technique    Person(s) Educated Patient    Methods Explanation;Demonstration;Tactile cues;Verbal cues    Comprehension Verbalized understanding;Returned demonstration;Verbal cues required;Tactile cues required;Need further instruction              PT Short Term Goals - 08/21/21 0814       PT SHORT TERM GOAL #1   Title Pt will be independent with HEP in order to improve strength and balance in order to decrease fall risk and  improve function at home and work.    Baseline 07/21/2021- Paitent presents with no formal HEP in place. 08/21/2021- Patient reports compliance with HEP and states no questions as of today- able to perform standin using his rollator for support.    Time 6    Period Weeks    Status Achieved    Target Date 09/01/21               PT Long Term Goals - 10/05/21 1135       PT LONG TERM GOAL #1   Title Pt will improve FOTO to target score of 53  to display perceived improvements in ability to complete ADL's.    Baseline 07/21/2021= 45; 1/5=51    Time 12    Period Weeks    Status On-going    Target Date 10/13/21      PT LONG TERM GOAL #2   Title Pt will improve BERG by at least 5 points in order to demonstrate clinically significant improvement in balance.    Baseline 07/21/2021= 39/56; 08/21/2021=46/56- Will keep goal active to ensure patient able to test consistently; 10/05/2021= 52/56    Time 12    Period Weeks    Status Achieved    Target Date 10/13/21      PT LONG TERM GOAL #3   Title Pt will decrease 5TSTS by at least 5 seconds in order to demonstrate clinically significant improvement in LE strength.    Baseline 07/21/2021= 21 sec without UE Support; 11/21= 14.0 sec without UE support-.Will Keep goal active to ensure consistency.  10/05/2021=14.75 sec without UE support  (improved from 21 sec and unchanged from last assessment of 14 sec)    Time 12    Period Weeks    Status Achieved    Target Date 10/13/21      PT LONG TERM GOAL #4   Title Pt will decrease TUG to below  18 seconds/decrease in order to demonstrate decreased fall risk.    Baseline 07/21/2021= 23 sec with 4WW; 08/21/2021= 22.13 sec with 4WW; 10/05/2021= 19.51 sec avg with 4WW    Time 12    Period Weeks    Status On-going    Target Date 10/13/21      PT LONG TERM GOAL #5   Title Pt will increase 10MWT by at least 0.23 m/s in order to demonstrate clinically significant improvement in community ambulation.     Baseline 07/21/2021= 0.72m/s; 08/21/2021= 0.57 m/s using 4WW; 10/05/2021= 0.6 m/s avg using 4WW    Time 12    Period Weeks    Status On-going    Target Date 10/13/21                   Plan - 10/10/21 0817     Clinical Impression Statement Patient presents with good motivation today and receptive to all VC for working Left LE and strategy for improving use of Left LE for more efficient gait. He was able to implement a good initial strategy with gait sequencing and improving left LE movement. The pt will benefit from further skilled PT to improve balance, strength, gait and safety with ADLs    Personal Factors and Comorbidities Comorbidity 3+    Comorbidities A- Fib, Renal cancer, Addison's disease, gout, arthritis, DM    Examination-Activity Limitations Caring for Others;Carry;Dressing;Lift;Reach Overhead;Squat;Stairs;Transfers;Stand    Examination-Participation Restrictions Community Activity;Cleaning;Driving;Yard Work;Shop    Stability/Clinical Decision Making Evolving/Moderate complexity    Rehab Potential Good    PT Frequency 2x / week    PT Duration 12 weeks    PT Treatment/Interventions ADLs/Self Care Home Management;Canalith Repostioning;Cryotherapy;Electrical Stimulation;Moist Heat;Traction;Ultrasound;DME Instruction;Gait training;Stair training;Functional mobility training;Therapeutic activities;Therapeutic exercise;Balance training;Neuromuscular re-education;Patient/family education;Orthotic Fit/Training;Wheelchair mobility training;Manual techniques;Passive range of motion;Dry needling;Energy conservation;Taping;Vestibular    PT Next Visit Plan Progressive therex to improve tolerance for standing/walking ADL's, contnue POC as previously indicated    PT Home Exercise Plan 08/17/2021-Access Code: [email or web address]; no updates    Consulted and Agree with Plan of Care Patient;Family member/caregiver    Family Member Consulted WIfe- [name]             Patient will benefit from  skilled therapeutic intervention in order to improve the following deficits and impairments:  Abnormal gait, Decreased activity tolerance, Decreased balance, Decreased coordination, Decreased endurance, Decreased knowledge of use of DME, Decreased mobility, Decreased strength, Difficulty walking, Hypomobility, Impaired sensation, Impaired vision/preception, Postural dysfunction, Pain  Visit Diagnosis: Abnormality of gait and mobility  Difficulty in walking, not elsewhere classified  Muscle weakness (generalized)  Unsteadiness on feet     Problem List Patient Active Problem List   Diagnosis Date Noted   Addisons disease ([place]) 03/16/2021   Low HDL (under 40) 03/16/2021   Difficulty sleeping 11/21/2020   Numbness and tingling 11/21/2020   Difficulty walking 01/14/2020   History of recent fall 01/14/2020   Sleep disorder 01/14/2020   Pancreatic mass 08/01/2018   H/O sebaceous cyst 01/06/2018   Tubular adenoma 10/28/2017   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   Edema leg 03/07/2017   Hx of atrial flutter 03/07/2017   OSA (obstructive sleep apnea) 07/24/2016   RLS (restless legs syndrome) 07/24/2016   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   Atrial fibrillation and flutter ([place]) 10/20/2014   Hyperlipidemia 06/16/2014   Adrenal crisis ([place]) 04/19/2014   Cough 03/29/2014   Weakness 03/29/2014   Cough syncope 10/15/2013   URI (upper respiratory infection) 10/15/2013  Dyslipidemia 09/04/2013   Gout 09/04/2013   Chronic kidney disease, stage III (moderate) ([place]) 11/13/2011   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   Hypothyroidism (acquired) 11/30/2010   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[place]) 11/10/2010    Lewis Moccasin, PT 10/10/2021, 12:31 PM  [place] MAIN [facility] SERVICES [address] [place], [place], [address] Phone: [phone]   Fax:  [phone]  Name: [place] Steidle MRN: [id number] Date of Birth:  [birth year]

## 2021-10-10 NOTE — Patient Instructions (Signed)
Ask your endocrinologist to draw uric acid level at next follow up.

## 2021-10-10 NOTE — Progress Notes (Signed)
Careteam: Patient Care Team: [name], NP as PCP - General (Geriatric Medicine) [name], MD as Physician Assistant (Endocrinology) [name], MD as Referring Physician (Neurology)  Advanced Directive information Does Patient Have a Medical Advance Directive?: Yes, Type of Advance Directive: Out of facility DNR (pink MOST or yellow form), Pre-existing out of facility DNR order (yellow form or pink MOST form): Pink MOST form placed in chart (order not valid for inpatient use);Yellow form placed in chart (order not valid for inpatient use), Does patient want to make changes to medical advance directive?: No - Patient declined  Allergies  Allergen Reactions   Other Rash    Blisters with bandaids Blisters with bandaids    Tegaderm Ag Mesh [[place]]    Tape Rash    Blisters, rash. Paper tape OK. Blisters, rash. Paper tape OK.     Chief Complaint  Patient presents with   Medical Management of Chronic Issues    Routine follow up. Patient would like to discuss his back itching. Has been itching for a couple of months. Patient tends to itch in areas that wrinkle when he lays on his side. [name] fell on yesterday   Health Maintenance    Hep c screening,zoster vaccine, 3rd COVID booster, flu vaccine     HPI: Patient is a 76 y.o. male seen in today at the [facility] for routine follow up.   Reports he has been doing okay.   He had a gout flare in October- had to be treated with prednisone which helped.   He has graduated from hospice to get PT and now under the palliative care team and the nurse comes to visit him occasionally. He has almost completed 90 days of PT. Really likes it. Continues to fall.  Tripped over a stool that he did not see.  No injury noted.   Reports he has an old pain under his right shoulder blade.   Saw neurology in July, has upcoming appt.   Back has been itchy. He uses moisturizer but has not worked. Using a powder and  that is beneficial and working well.    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   Review of Systems:  Review of Systems  Constitutional:  Positive for malaise/fatigue. Negative for chills, fever and weight loss.  HENT:  Negative for tinnitus.   Respiratory:  Negative for cough, sputum production and shortness of breath.   Cardiovascular:  Negative for chest pain, palpitations and leg swelling.  Gastrointestinal:  Negative for abdominal pain, constipation, diarrhea and heartburn.  Genitourinary:  Negative for dysuria, frequency and urgency.  Musculoskeletal:  Positive for falls. Negative for back pain, joint pain and myalgias.  Skin: Negative.   Neurological:  Positive for tingling and weakness. Negative for dizziness and headaches.  Psychiatric/Behavioral:  Negative for depression and memory loss. The patient does not have insomnia.    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   Chronic kidney disease, stage 3 ([place]) 11/13/2011   Cough 03/29/2014   Edema leg 03/07/2017   Elevated cholesterol with high triglycerides 09/04/2013   Elevated red blood cell count  Erectile dysfunction    Falls frequently    Fever 03/29/2014   Generalized weakness 03/29/2014   GERD (gastroesophageal reflux disease)    Gout 09/04/2013   H/O adrenal insufficiency 05/17/2014   H/O atrial flutter 03/07/2017   H/O atrial flutter    H/O eye surgery    H/O sebaceous cyst 01/06/2018   H/O thyroid disease    H/O upper respiratory infection 10/15/2013   Hearing loss    History of back surgery    History of blepharoplasty    History of cancer 10/04/2015   History of chemotherapy    History of esophagogastroduodenoscopy (EGD)     History of prediabetes    Hyperlipidemia 06/16/2014   Hypertension    Hypertriglyceridemia    Hypothyroidism (acquired) 04/14/2014   Left leg swelling 11/23/2016   Low HDL (under 40)    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[place]) 11/10/2010   Malignant neoplasm of unspecified kidney, except renal pelvis (HCC)    OSA (obstructive sleep apnea) 07/24/2016   OSA (obstructive sleep apnea)    PAF (paroxysmal atrial fibrillation) (HCC)    PAF (paroxysmal atrial fibrillation) ([place])    Pancreatic mass 08/01/2018   Renal carcinoma ([place]) 06/16/2015   RLS (restless legs syndrome) 07/24/2016   Sleep apnea    Testicular hypofunction    Tubular adenoma 10/28/2017   Tussive syncopes 10/15/2013   Type 2 diabetes mellitus ([place])    Vitamin D deficiency disease    Past Surgical History:  Procedure Laterality Date   ADRENALECTOMY     CATARACT EXTRACTION  10/2014   CATARACT EXTRACTION EXTRACAPSULAR  11/05/2016   with Insertion Intraocular Prostheis.    COLONOSCOPY  05/06/2017   with removal lesions by snare.   COLONOSCOPY W/ BIOPSIES     05/06/2017   NEPHRECTOMY     POPLITEAL SYNOVIAL CYST EXCISION     SPINE SURGERY  1989   THYROIDECTOMY     THYROIDECTOMY, PARTIAL     TONSILLECTOMY     Social History:   reports that he quit smoking about 45 years ago. His smoking use included cigarettes. He has a 10.00 pack-year smoking history. He has never used smokeless tobacco. He reports that he does not currently use alcohol. He reports that he does not use drugs.  Family History  Problem Relation Age of Onset   Ovarian cancer Mother    Cancer Mother    Cancer Father    Heart disease Father    Coronary artery disease Father    Hypertension Father    Macular degeneration Father    Heart disease Brother    Diabetes Brother    Coronary artery disease Brother    Diabetes type II Brother    Coronary artery disease Paternal Grandfather     Medications: Patient's Medications  New Prescriptions   No  medications on file  Previous Medications   ACCU-CHEK SOFTCLIX LANCETS LANCETS    Use to check blood sugar once daily. Dx: E11.49   BLOOD GLUCOSE METER KIT AND SUPPLIES    1 each by Other route as directed. Dispense based on patient and insurance preference. Use up to four times daily as directed. (FOR ICD-10 E10.9, E11.9).   CHOLECALCIFEROL 125 MCG (5000 UT) CAPSULE    Take 5,000 Units by mouth daily.   COLCHICINE 0.6 MG TABLET    Take 1 tablet (0.6 mg total) by mouth as needed. First sign of Gout Flare. Take 2 tablets ( 1.$RemoveBef'2mg'InFfKzzxJQ$ ) by mouth at first sign of gout flare  followed by 1 tablet ( 0.$RemoveBe'6mg'sRoEfsKGh$ ) after 1 hour. ( max 1.$Remov'8mg'VreRxK$  within 1 hours)   CYANOCOBALAMIN 1000 MCG TABLET    Take 1,000 mcg by mouth every other day.   FLUDROCORTISONE ACETATE (FLORINEF PO)    Take 0.1 mg by mouth every other day. On alternate days take 1/2 tablet.   FUROSEMIDE (LASIX) 20 MG TABLET    Take 20 mg by mouth as needed. For Edema.   GABAPENTIN (NEURONTIN) 600 MG TABLET    Take 900 mg by mouth daily. Takes 1 and 1/2 tablet by mouth nightly.   GLUCOSE BLOOD (ACCU-CHEK AVIVA PLUS) TEST STRIP    Use to test blood sugar once daily. Dx: E11.49   HYDROCORTISONE (CORTEF) 10 MG TABLET    Take by mouth daily. Takes 1 and 1/2 tablet every morning, 1/2 tablet every afternoon, and 1/2 tablet every evening. Take double dose as directed for stress, may take up to 85 tablets monthly.   LEVOTHYROXINE (SYNTHROID) 75 MCG TABLET    Take 75 mcg by mouth daily.   LIDOCAINE (XYLOCAINE) 5 % OINTMENT    Apply 1 application topically as needed.   METFORMIN (GLUMETZA) 500 MG (MOD) 24 HR TABLET    Take 500 mg by mouth daily.   MULTIPLE VITAMINS-MINERALS (PRESERVISION AREDS 2 PO)    Take 1 capsule by mouth in the morning and at bedtime.   NORTRIPTYLINE (PAMELOR) 10 MG CAPSULE    2 nightly   PREDNISONE (STERAPRED UNI-PAK 21 TAB) 10 MG (21) TBPK TABLET    Use as directed   PSYLLIUM HUSK PO    Take 3.4 g by mouth every evening.  Modified Medications   No  medications on file  Discontinued Medications   No medications on file    Physical Exam:  Vitals:   10/10/21 1111  BP: 110/80  Pulse: 86  Temp: 98.4 F (36.9 C)  SpO2: 95%  Weight: 206 lb (93.4 kg)  Height: $Remove'5\' 8"'ibxsrzZ$  (1.727 m)   Body mass index is 31.32 kg/m. Wt Readings from Last 3 Encounters:  10/10/21 206 lb (93.4 kg)  07/21/21 201 lb (91.2 kg)  05/16/21 205 lb (93 kg)    Physical Exam Constitutional:      General: He is not in acute distress.    Appearance: He is well-developed. He is not diaphoretic.  HENT:     Head: Normocephalic and atraumatic.     Right Ear: External ear normal.     Left Ear: External ear normal.     Mouth/Throat:     Pharynx: No oropharyngeal exudate.  Eyes:     Conjunctiva/sclera: Conjunctivae normal.     Pupils: Pupils are equal, round, and reactive to light.  Cardiovascular:     Rate and Rhythm: Normal rate and regular rhythm.     Heart sounds: Normal heart sounds.  Pulmonary:     Effort: Pulmonary effort is normal.     Breath sounds: Normal breath sounds.  Abdominal:     General: Bowel sounds are normal.     Palpations: Abdomen is soft.  Musculoskeletal:        General: No tenderness.     Cervical back: Normal range of motion and neck supple.     Right lower leg: No edema.     Left lower leg: No edema.  Skin:    General: Skin is warm and dry.  Neurological:     Mental Status: He is alert and oriented to person, place, and time.    Labs  reviewed: Basic Metabolic Panel: Recent Labs    01/20/21 1226  NA 137  K 4.7  CL 101  CO2 26  GLUCOSE 154*  BUN 20  CREATININE 1.07  CALCIUM 9.3   Liver Function Tests: No results for input(s): AST, ALT, ALKPHOS, BILITOT, PROT, ALBUMIN in the last 8760 hours. No results for input(s): LIPASE, AMYLASE in the last 8760 hours. No results for input(s): AMMONIA in the last 8760 hours. CBC: Recent Labs    01/20/21 1226  WBC 7.5  NEUTROABS 5,160  HGB 14.6  HCT 44.2  MCV 87.0  PLT 304    Lipid Panel: No results for input(s): CHOL, HDL, LDLCALC, TRIG, CHOLHDL, LDLDIRECT in the last 8760 hours. TSH: No results for input(s): TSH in the last 8760 hours. A1C: No results found for: HGBA1C   Assessment/Plan 1. Sensory polyneuropathy -ongoing, currently in outpatient PT.   2. Gait instability -ongoing with frequent falls, continues PT.   3. 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 He has been followed by palliative care.   4. Gout, unspecified cause, unspecified chronicity, unspecified site -continues on allopurinol. Last flare in October. Will get uric acid level with next blood draw from specialist.   5. Adrenal insufficiency ([place]) -s/p bilateral adrenalectomy. Followed by endocrinology, continues on fludrocortisone with hydrocortisone.   6. Type 2 diabetes mellitus with neurological complications (HCC) Stable, continues on metformin.   7. OSA on CPAP Stable at this time.   8. 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    Next appt: 6 months.  Carlos American. [place], [facility] [phone]

## 2021-10-13 ENCOUNTER — Other Ambulatory Visit: Payer: Self-pay

## 2021-10-13 ENCOUNTER — Ambulatory Visit: Payer: Medicare Other

## 2021-10-13 DIAGNOSIS — R269 Unspecified abnormalities of gait and mobility: Secondary | ICD-10-CM | POA: Diagnosis not present

## 2021-10-13 DIAGNOSIS — R262 Difficulty in walking, not elsewhere classified: Secondary | ICD-10-CM

## 2021-10-13 DIAGNOSIS — M6281 Muscle weakness (generalized): Secondary | ICD-10-CM

## 2021-10-13 DIAGNOSIS — R2681 Unsteadiness on feet: Secondary | ICD-10-CM

## 2021-10-13 NOTE — Therapy (Signed)
[place] MAIN [facility] SERVICES [address] [place], [place], [address] Phone: [phone]   Fax:  [phone]  Physical Therapy Treatment/Recertification for dates 10/13/2021- 01/05/2022  Patient Details  Name: Tony Huffman MRN: [id number] Date of Birth: [birth year] Referring Provider (PT): Dr. Joselyn Arrow   Encounter Date: 10/13/2021   PT End of Session - 10/13/21 0808     Visit Number 22    Number of Visits 25    Date for PT Re-Evaluation 10/13/21    Authorization Type Medicare Part B    Authorization Time Period 07/21/2021- 10/13/2021    Progress Note Due on Visit 10    PT Start Time 0800    PT Stop Time 0845    PT Time Calculation (min) 45 min    Equipment Utilized During Treatment Gait belt;Other (comment)    Activity Tolerance Patient tolerated treatment well    Behavior During Therapy [facility] for tasks assessed/performed             Past Medical History:  Diagnosis Date   Abnormal heart rhythm 10/20/2014   Addison's disease ([place])    Adrenal insufficiency ([place])    Allergy    Ambulates with cane    Anxiety    Arrhythmia    Cataract cortical, senile    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   Chronic kidney disease, stage 3 ([place]) 11/13/2011   Cough 03/29/2014   Edema leg 03/07/2017   Elevated cholesterol with high triglycerides 09/04/2013   Elevated red blood cell count    Erectile dysfunction    Falls frequently    Fever 03/29/2014   Generalized weakness 03/29/2014   GERD (gastroesophageal reflux disease)    Gout 09/04/2013   H/O adrenal insufficiency 05/17/2014   H/O atrial flutter 03/07/2017   H/O atrial flutter    H/O eye surgery    H/O sebaceous cyst 01/06/2018   H/O thyroid disease    H/O upper respiratory infection 10/15/2013   Hearing loss    History of back surgery    History of blepharoplasty    History of cancer 10/04/2015   History of chemotherapy    History of esophagogastroduodenoscopy (EGD)    History of  prediabetes    Hyperlipidemia 06/16/2014   Hypertension    Hypertriglyceridemia    Hypothyroidism (acquired) 04/14/2014   Left leg swelling 11/23/2016   Low HDL (under 40)    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[place]) 11/10/2010   Malignant neoplasm of unspecified kidney, except renal pelvis (HCC)    OSA (obstructive sleep apnea) 07/24/2016   OSA (obstructive sleep apnea)    PAF (paroxysmal atrial fibrillation) (HCC)    PAF (paroxysmal atrial fibrillation) ([place])    Pancreatic mass 08/01/2018   Renal carcinoma ([place]) 06/16/2015   RLS (restless legs syndrome) 07/24/2016   Sleep apnea    Testicular hypofunction    Tubular adenoma 10/28/2017   Tussive syncopes 10/15/2013   Type 2 diabetes mellitus ([place])    Vitamin D deficiency disease     Past Surgical History:  Procedure Laterality Date   ADRENALECTOMY     CATARACT EXTRACTION  10/2014   CATARACT EXTRACTION EXTRACAPSULAR  11/05/2016   with Insertion Intraocular Prostheis.    COLONOSCOPY  05/06/2017   with removal lesions by snare.   COLONOSCOPY W/ BIOPSIES     05/06/2017   NEPHRECTOMY     POPLITEAL SYNOVIAL CYST EXCISION     SPINE SURGERY  1989   THYROIDECTOMY     THYROIDECTOMY, PARTIAL  TONSILLECTOMY      There were no vitals filed for this visit.   Subjective Assessment - 10/13/21 0805     Subjective Patient's wife reports that his 4WW is getting too far out in front of him and questioning if another walker might be more appropriate.    Patient is accompained by: Family member   [name]   Pertinent History Patient is a 76 year old male with referral from Neurology- Dr. Joselyn Arrow for unspecified abnormality of gait. He presents today with his wife and reports > 6 month history of progressive issues with poor balance and multiple falls. Patient has renal Cancer and currently has Pallative care- Was stabilized out of Hospice. Patient presents with the following past medical history: A-Fib and flutter, Lung Cancer,  Obstructive sleep apnea, Upper respiratory infection, Addison's disease, Hypothyroidism, Renal Cancer, DM, gout, arthritis, Chronic kidney disease and Restless leg syndrome. Patient lives with wife in independent living at [facility]. Prior level of function was independent with transfers and mobility using walker yet some assist with dressing.    Limitations Lifting;Standing;Walking;House hold activities    How long can you sit comfortably? No issues    How long can you stand comfortably? <5 min    How long can you walk comfortably? <5 min    Diagnostic tests EXAM:  MRI HEAD WITHOUT CONTRAST     TECHNIQUE:  Multiplanar, multiecho pulse sequences of the brain and surrounding  structures were obtained without intravenous contrast.     COMPARISON:  CT head without contrast 01/20/2021     FINDINGS:  Brain: Moderate generalized atrophy is present. 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 Ventricles are enlarged  bilaterally. There is mild ventricular asymmetry, the right is  larger.     No acute infarct, hemorrhage, or mass lesion is present. No other  significant extra-axial collection is present.     The internal auditory canals are within normal limits. The brainstem  and cerebellum are within normal limits.     Vascular: Flow is present in the major intracranial arteries.     Skull and upper cervical spine: The craniocervical junction is  normal. Upper cervical spine is within normal limits. Marrow signal  is unremarkable.     Sinuses/Orbits: The paranasal sinuses and mastoid air cells are  clear. Bilateral lens replacements are noted. Globes and orbits are  otherwise unremarkable.     IMPRESSION:  1. No acute intracranial abnormality.  2. Moderate generalized atrophy and white matter disease likely  reflects the sequela of chronic microvascular ischemia.  3. Arachnoid cyst over the left convexity.    Patient Stated Goals I would like to walk better and not fall    Currently in Pain? No/denies     Pain Onset In the past 7 days    Pain Onset More than a month ago             Recert Visit- All goals were just assessed - See goal section- all remaining goals still appropriate and will continue into new recertification. Updated target dates   Nustep - Level 0 at seat 8 for 5 min (LE only) to focus on coordinating left LE movement - HR=98 bpm. Constant VC to keep SPM > 40.     Assessed gait with RW - 160 feet with increased Left foot drag after 50 feet of ambulation with constant VC to catch up and stay within walker.   Assessed gait with SW- 75  feet    BIODEX: Right LE off the side of Treadmill and left LE on treadmill belt- CGA. Patient was able to ambulate on TM for 2 min  at 0.4 mph- Focusing on concentrating on keeping Left LE moving and as much heel to toe. VC to remind patient to keep Left LE moving with varying step length.   IN // bars- ambulation - down and back kicking left LE (knocking cones over) for visual feedback to extend leg more with swing phase and patient able to more consistently kick leg forward using this strategy.                          PT Education - 10/13/21 0807     Education Details Exercise technique; Education on safety with DME- Walkers    Person(s) Educated Patient;Spouse    Methods Explanation;Demonstration;Tactile cues;Verbal cues    Comprehension Verbalized understanding;Returned demonstration;Verbal cues required;Tactile cues required;Need further instruction              PT Short Term Goals - 08/21/21 0814       PT SHORT TERM GOAL #1   Title Pt will be independent with HEP in order to improve strength and balance in order to decrease fall risk and improve function at home and work.    Baseline 07/21/2021- Paitent presents with no formal HEP in place. 08/21/2021- Patient reports compliance with HEP and states no questions as of today- able to perform standin using his rollator for support.    Time 6    Period  Weeks    Status Achieved    Target Date 09/01/21               PT Long Term Goals - 10/13/21 1526       PT LONG TERM GOAL #1   Title Pt will improve FOTO to target score of 53  to display perceived improvements in ability to complete ADL's.    Baseline 07/21/2021= 45; 1/5=51. 10/12/2021- Just reassessed for progress note so did not reassess again- will continue with goal into new certification    Time 12    Period Weeks    Status On-going    Target Date 01/05/22      PT LONG TERM GOAL #2   Title Pt will improve BERG by at least 5 points in order to demonstrate clinically significant improvement in balance.    Baseline 07/21/2021= 39/56; 08/21/2021=46/56- Will keep goal active to ensure patient able to test consistently; 10/05/2021= 52/56    Time 12    Period Weeks    Status Achieved    Target Date 10/13/21      PT LONG TERM GOAL #3   Title Pt will decrease 5TSTS by at least 5 seconds in order to demonstrate clinically significant improvement in LE strength.    Baseline 07/21/2021= 21 sec without UE Support; 11/21= 14.0 sec without UE support-.Will Keep goal active to ensure consistency.  10/05/2021=14.75 sec without UE support  (improved from 21 sec and unchanged from last assessment of 14 sec)    Time 12    Period Weeks    Status Achieved    Target Date 10/13/21      PT LONG TERM GOAL #4   Title Pt will decrease TUG to below  18 seconds/decrease in order to demonstrate decreased fall risk.    Baseline 07/21/2021= 23 sec with 4WW; 08/21/2021= 22.13 sec with 4WW; 10/05/2021= 19.51 sec avg with 3ZJ  Time 12    Period Weeks    Status On-going    Target Date 01/05/22      PT LONG TERM GOAL #5   Title Pt will increase 10MWT by at least 0.23 m/s in order to demonstrate clinically significant improvement in community ambulation.    Baseline 07/21/2021= 0.11m/s; 08/21/2021= 0.57 m/s using 4WW; 10/05/2021= 0.6 m/s avg using 4WW    Time 12    Period Weeks    Status On-going     Target Date 01/05/22                   Plan - 10/13/21 0808     Clinical Impression Statement Patient presents today with ongoing difficulty with left LE coordination- Exhibiting difficulty with motor programming and timing as well as continued fatigue. Strategies in place to continue to focus on overall strength and improved coordination. All goals were just assessed 2 visits ago and still appropriate so updated target dates for recertification today. Patient's condition has the potential to improve in response to therapy. Maximum improvement is yet to be obtained. The anticipated improvement is attainable and reasonable in a generally predictable time.    Personal Factors and Comorbidities Comorbidity 3+    Comorbidities A- Fib, Renal cancer, Addison's disease, gout, arthritis, DM    Examination-Activity Limitations Caring for Others;Carry;Dressing;Lift;Reach Overhead;Squat;Stairs;Transfers;Stand    Examination-Participation Restrictions Investment banker, operational;Yard Work;Shop    Stability/Clinical Decision Making Evolving/Moderate complexity    Rehab Potential Good    PT Frequency 2x / week    PT Duration 12 weeks    PT Treatment/Interventions ADLs/Self Care Home Management;Canalith Repostioning;Cryotherapy;Electrical Stimulation;Moist Heat;Traction;Ultrasound;DME Instruction;Gait training;Stair training;Functional mobility training;Therapeutic activities;Therapeutic exercise;Balance training;Neuromuscular re-education;Patient/family education;Orthotic Fit/Training;Wheelchair mobility training;Manual techniques;Passive range of motion;Dry needling;Energy conservation;Taping;Vestibular    PT Next Visit Plan Progressive therex to improve tolerance for standing/walking ADL's, contnue POC as previously indicated    PT Home Exercise Plan 08/17/2021-Access Code: [email or web address]; no updates    Consulted and Agree with Plan of Care Patient;Family member/caregiver    Family Member Consulted  WIfe- [name]             Patient will benefit from skilled therapeutic intervention in order to improve the following deficits and impairments:  Abnormal gait, Decreased activity tolerance, Decreased balance, Decreased coordination, Decreased endurance, Decreased knowledge of use of DME, Decreased mobility, Decreased strength, Difficulty walking, Hypomobility, Impaired sensation, Impaired vision/preception, Postural dysfunction, Pain  Visit Diagnosis: Abnormality of gait and mobility  Difficulty in walking, not elsewhere classified  Muscle weakness (generalized)  Unsteadiness on feet     Problem List Patient Active Problem List   Diagnosis Date Noted   Addisons disease ([place]) 03/16/2021   Low HDL (under 40) 03/16/2021   Difficulty sleeping 11/21/2020   Numbness and tingling 11/21/2020   Difficulty walking 01/14/2020   History of recent fall 01/14/2020   Sleep disorder 01/14/2020   Pancreatic mass 08/01/2018   H/O sebaceous cyst 01/06/2018   Tubular adenoma 10/28/2017   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   Edema leg 03/07/2017   Hx of atrial flutter 03/07/2017   OSA (obstructive sleep apnea) 07/24/2016   RLS (restless legs syndrome) 07/24/2016   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 [place], [place], [address] Phone: [phone]   Fax:   [phone]  Name: Tony Huffman MRN: [id number] Date of Birth: 1945-12-17

## 2021-10-17 ENCOUNTER — Ambulatory Visit: Payer: Medicare Other

## 2021-10-17 ENCOUNTER — Other Ambulatory Visit: Payer: Self-pay

## 2021-10-17 DIAGNOSIS — R269 Unspecified abnormalities of gait and mobility: Secondary | ICD-10-CM | POA: Diagnosis not present

## 2021-10-17 DIAGNOSIS — R2681 Unsteadiness on feet: Secondary | ICD-10-CM

## 2021-10-17 DIAGNOSIS — R262 Difficulty in walking, not elsewhere classified: Secondary | ICD-10-CM

## 2021-10-17 DIAGNOSIS — M6281 Muscle weakness (generalized): Secondary | ICD-10-CM

## 2021-10-17 NOTE — Therapy (Signed)
[place] MAIN [facility] SERVICES [address] [place], [place], [address] Phone: [phone]   Fax:  [phone]  Physical Therapy Treatment  Patient Details  Name: Tony Huffman MRN: [id number] Date of Birth: 03-Dec-1945 Referring Provider (PT): Dr. Joselyn Arrow   Encounter Date: 10/17/2021   PT End of Session - 10/17/21 1003     Visit Number 23    Number of Visits 29    Date for PT Re-Evaluation 01/05/22    Authorization Type Medicare Part B    Authorization Time Period 07/21/2021- 10/13/2021; 10/13/2021-01/05/2022    Progress Note Due on Visit 30    PT Start Time 0801    PT Stop Time 0843    PT Time Calculation (min) 42 min    Equipment Utilized During Treatment Gait belt;Other (comment)    Activity Tolerance Patient tolerated treatment well    Behavior During Therapy [facility] for tasks assessed/performed             Past Medical History:  Diagnosis Date   Abnormal heart rhythm 10/20/2014   Addison's disease ([place])    Adrenal insufficiency ([place])    Allergy    Ambulates with cane    Anxiety    Arrhythmia    Cataract cortical, senile    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   Chronic kidney disease, stage 3 ([place]) 11/13/2011   Cough 03/29/2014   Edema leg 03/07/2017   Elevated cholesterol with high triglycerides 09/04/2013   Elevated red blood cell count    Erectile dysfunction    Falls frequently    Fever 03/29/2014   Generalized weakness 03/29/2014   GERD (gastroesophageal reflux disease)    Gout 09/04/2013   H/O adrenal insufficiency 05/17/2014   H/O atrial flutter 03/07/2017   H/O atrial flutter    H/O eye surgery    H/O sebaceous cyst 01/06/2018   H/O thyroid disease    H/O upper respiratory infection 10/15/2013   Hearing loss    History of back surgery    History of blepharoplasty    History of cancer 10/04/2015   History of chemotherapy    History of esophagogastroduodenoscopy (EGD)    History of prediabetes     Hyperlipidemia 06/16/2014   Hypertension    Hypertriglyceridemia    Hypothyroidism (acquired) 04/14/2014   Left leg swelling 11/23/2016   Low HDL (under 40)    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[place]) 11/10/2010   Malignant neoplasm of unspecified kidney, except renal pelvis (HCC)    OSA (obstructive sleep apnea) 07/24/2016   OSA (obstructive sleep apnea)    PAF (paroxysmal atrial fibrillation) (H[facility])    PAF (paroxysmal atrial fibrillation) ([place])    Pancreatic mass 08/01/2018   Renal carcinoma ([place]) 06/16/2015   RLS (restless legs syndrome) 07/24/2016   Sleep apnea    Testicular hypofunction    Tubular adenoma 10/28/2017   Tussive syncopes 10/15/2013   Type 2 diabetes mellitus ([place])    Vitamin D deficiency disease     Past Surgical History:  Procedure Laterality Date   ADRENALECTOMY     CATARACT EXTRACTION  10/2014   CATARACT EXTRACTION EXTRACAPSULAR  11/05/2016   with Insertion Intraocular Prostheis.    COLONOSCOPY  05/06/2017   with removal lesions by snare.   COLONOSCOPY W/ BIOPSIES     05/06/2017   NEPHRECTOMY     POPLITEAL SYNOVIAL CYST EXCISION     SPINE SURGERY  1989   THYROIDECTOMY     THYROIDECTOMY, PARTIAL  TONSILLECTOMY      There were no vitals filed for this visit.   Subjective Assessment - 10/17/21 0959     Subjective Patient reports that he is walking more at home and no falls over the weekend.    Patient is accompained by: Family member   wife - Tony Huffman   Pertinent History Patient is a 76 year old male with referral from Neurology- Dr. Joselyn Arrow for unspecified abnormality of gait. He presents today with his wife and reports > 6 month history of progressive issues with poor balance and multiple falls. Patient has renal Cancer and currently has Pallative care- Was stabilized out of Hospice. Patient presents with the following past medical history: A-Fib and flutter, Lung Cancer, Obstructive sleep apnea, Upper respiratory infection, Addison's disease,  Hypothyroidism, Renal Cancer, DM, gout, arthritis, Chronic kidney disease and Restless leg syndrome. Patient lives with wife in independent living at [facility]. Prior level of function was independent with transfers and mobility using walker yet some assist with dressing.    Limitations Lifting;Standing;Walking;House hold activities    How long can you sit comfortably? No issues    How long can you stand comfortably? <5 min    How long can you walk comfortably? <5 min    Diagnostic tests EXAM:  MRI HEAD WITHOUT CONTRAST     TECHNIQUE:  Multiplanar, multiecho pulse sequences of the brain and surrounding  structures were obtained without intravenous contrast.     COMPARISON:  CT head without contrast 01/20/2021     FINDINGS:  Brain: Moderate generalized atrophy is present. 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 Ventricles are enlarged  bilaterally. There is mild ventricular asymmetry, the right is  larger.     No acute infarct, hemorrhage, or mass lesion is present. No other  significant extra-axial collection is present.     The internal auditory canals are within normal limits. The brainstem  and cerebellum are within normal limits.     Vascular: Flow is present in the major intracranial arteries.     Skull and upper cervical spine: The craniocervical junction is  normal. Upper cervical spine is within normal limits. Marrow signal  is unremarkable.     Sinuses/Orbits: The paranasal sinuses and mastoid air cells are  clear. Bilateral lens replacements are noted. Globes and orbits are  otherwise unremarkable.     IMPRESSION:  1. No acute intracranial abnormality.  2. Moderate generalized atrophy and white matter disease likely  reflects the sequela of chronic microvascular ischemia.  3. Arachnoid cyst over the left convexity.    Patient Stated Goals I would like to walk better and not fall    Currently in Pain? No/denies    Pain Onset In the past 7 days    Pain Onset More than a month ago             INTERVENTIONS:   Therex:   Step ups onto 2nd step with BUE Support x 12 reps  Step ups onto 1st step with B UE Support x 12 reps  Gait training:  ambulation activity- Instructions to count steps along 23 feet path using SW with CGA- 1st trial required 32 steps  in 51 sec and 2nd trial required 31 steps in 56 sec.  Patient reports B shoulder tiredness.   Next performed same distance using RW for 2 trials 1) 23 feet in 24 steps in 24 sec 2) 23 feet in 22 steps in 25.24 sec  Next performed  60 feet x 2 trials: 1) using SW- 98 steps in 2 min 32 sec- Patient reports very fatigued.  2) using RW- 60 steps  in 1 min 2 sec- Patient did drag left LE at end of walk.   Education provided throughout session via VC/TC and demonstration to facilitate movement at target joints and correct muscle activation for all testing and exercises performed.                                 PT Education - 10/18/21 1001     Education Details Exercise/gait technique    Person(s) Educated Patient    Methods Explanation;Demonstration;Tactile cues;Verbal cues    Comprehension Verbalized understanding;Returned demonstration;Verbal cues required;Tactile cues required;Need further instruction              PT Short Term Goals - 08/21/21 0814       PT SHORT TERM GOAL #1   Title Pt will be independent with HEP in order to improve strength and balance in order to decrease fall risk and improve function at home and work.    Baseline 07/21/2021- Paitent presents with no formal HEP in place. 08/21/2021- Patient reports compliance with HEP and states no questions as of today- able to perform standin using his rollator for support.    Time 6    Period Weeks    Status Achieved    Target Date 09/01/21               PT Long Term Goals - 10/13/21 1526       PT LONG TERM GOAL #1   Title Pt will improve FOTO to target score of 53  to display perceived improvements in  ability to complete ADL's.    Baseline 07/21/2021= 45; 1/5=51. 10/12/2021- Just reassessed for progress note so did not reassess again- will continue with goal into new certification    Time 12    Period Weeks    Status On-going    Target Date 01/05/22      PT LONG TERM GOAL #2   Title Pt will improve BERG by at least 5 points in order to demonstrate clinically significant improvement in balance.    Baseline 07/21/2021= 39/56; 08/21/2021=46/56- Will keep goal active to ensure patient able to test consistently; 10/05/2021= 52/56    Time 12    Period Weeks    Status Achieved    Target Date 10/13/21      PT LONG TERM GOAL #3   Title Pt will decrease 5TSTS by at least 5 seconds in order to demonstrate clinically significant improvement in LE strength.    Baseline 07/21/2021= 21 sec without UE Support; 11/21= 14.0 sec without UE support-.Will Keep goal active to ensure consistency.  10/05/2021=14.75 sec without UE support  (improved from 21 sec and unchanged from last assessment of 14 sec)    Time 12    Period Weeks    Status Achieved    Target Date 10/13/21      PT LONG TERM GOAL #4   Title Pt will decrease TUG to below  18 seconds/decrease in order to demonstrate decreased fall risk.    Baseline 07/21/2021= 23 sec with 4WW; 08/21/2021= 22.13 sec with 4WW; 10/05/2021= 19.51 sec avg with 4WW    Time 12    Period Weeks    Status On-going    Target Date 01/05/22      PT LONG TERM GOAL #5  Title Pt will increase 10MWT by at least 0.23 m/s in order to demonstrate clinically significant improvement in community ambulation.    Baseline 07/21/2021= 0.44m/s; 08/21/2021= 0.57 m/s using 4WW; 10/05/2021= 0.6 m/s avg using 4WW    Time 12    Period Weeks    Status On-going    Target Date 01/05/22                   Plan - 10/17/21 1004     Clinical Impression Statement Patient presents today with good overall motivation and responded fairly to [place] to decrease amount of verbal cues and  instead had patient try counting his steps in hopes he would decrease his steps which he did. From a safety stand point based on today's data- patient presents with increased overall gait speed and efficiency using a RW vs. SW today and was able to stay within walk for distances 60 feet or less. Would like still recommend using a SW for any longer distances. The pt will benefit from further skilled PT to improve balance, strength, gait and safety with ADLs    Personal Factors and Comorbidities Comorbidity 3+    Comorbidities A- Fib, Renal cancer, Addison's disease, gout, arthritis, DM    Examination-Activity Limitations Caring for Others;Carry;Dressing;Lift;Reach Overhead;Squat;Stairs;Transfers;Stand    Examination-Participation Restrictions Community Activity;Cleaning;Driving;Yard Work;Shop    Stability/Clinical Decision Making Evolving/Moderate complexity    Rehab Potential Good    PT Frequency 2x / week    PT Duration 12 weeks    PT Treatment/Interventions ADLs/Self Care Home Management;Canalith Repostioning;Cryotherapy;Electrical Stimulation;Moist Heat;Traction;Ultrasound;DME Instruction;Gait training;Stair training;Functional mobility training;Therapeutic activities;Therapeutic exercise;Balance training;Neuromuscular re-education;Patient/family education;Orthotic Fit/Training;Wheelchair mobility training;Manual techniques;Passive range of motion;Dry needling;Energy conservation;Taping;Vestibular    PT Next Visit Plan Continue with progress LE strengthening; gait training for safe mobility, Neuromuscular re-ed for improved overall balance.    PT Home Exercise Plan 08/17/2021-Access Code: [email or web address]; no updates    Consulted and Agree with Plan of Care Patient;Family member/caregiver    Family Member Consulted WIfe- Tony Huffman             Patient will benefit from skilled therapeutic intervention in order to improve the following deficits and impairments:  Abnormal gait, Decreased activity tolerance,  Decreased balance, Decreased coordination, Decreased endurance, Decreased knowledge of use of DME, Decreased mobility, Decreased strength, Difficulty walking, Hypomobility, Impaired sensation, Impaired vision/preception, Postural dysfunction, Pain  Visit Diagnosis: Abnormality of gait and mobility  Difficulty in walking, not elsewhere classified  Muscle weakness (generalized)  Unsteadiness on feet     Problem List Patient Active Problem List   Diagnosis Date Noted   Addisons disease ([place]) 03/16/2021   Low HDL (under 40) 03/16/2021   Difficulty sleeping 11/21/2020   Numbness and tingling 11/21/2020   Difficulty walking 01/14/2020   History of recent fall 01/14/2020   Sleep disorder 01/14/2020   Pancreatic mass 08/01/2018   H/O sebaceous cyst 01/06/2018   Tubular adenoma 10/28/2017   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   Edema leg 03/07/2017   Hx of atrial flutter 03/07/2017   OSA (obstructive sleep apnea) 07/24/2016   RLS (restless legs syndrome) 07/24/2016   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 [place], [place], [address] Phone: [phone]   Fax:  [phone]  Name: Amy Belloso MRN: [id number] Date of Birth: 10-Oct-1945

## 2021-10-20 ENCOUNTER — Other Ambulatory Visit: Payer: Self-pay

## 2021-10-20 ENCOUNTER — Ambulatory Visit: Payer: Medicare Other

## 2021-10-20 DIAGNOSIS — R269 Unspecified abnormalities of gait and mobility: Secondary | ICD-10-CM

## 2021-10-20 DIAGNOSIS — R262 Difficulty in walking, not elsewhere classified: Secondary | ICD-10-CM

## 2021-10-20 DIAGNOSIS — M6281 Muscle weakness (generalized): Secondary | ICD-10-CM

## 2021-10-20 DIAGNOSIS — R2681 Unsteadiness on feet: Secondary | ICD-10-CM

## 2021-10-20 NOTE — Therapy (Signed)
[place] MAIN [facility] SERVICES [address] [place], [place], [address] Phone: [phone]   Fax:  [phone]  Physical Therapy Treatment  Patient Details  Name: Tony Huffman MRN: [id number] Date of Birth: Aug 29, 1946 Referring Provider (PT): Dr. Joselyn Arrow   Encounter Date: 10/20/2021   PT End of Session - 10/20/21 0806     Visit Number 24    Number of Visits 28    Date for PT Re-Evaluation 01/05/22    Authorization Type Medicare Part B    Authorization Time Period 07/21/2021- 10/13/2021; 10/13/2021-01/05/2022    Progress Note Due on Visit 30    PT Start Time 0800    PT Stop Time 0844    PT Time Calculation (min) 44 min    Equipment Utilized During Treatment Gait belt;Other (comment)    Activity Tolerance Patient tolerated treatment well    Behavior During Therapy [facility] for tasks assessed/performed             Past Medical History:  Diagnosis Date   Abnormal heart rhythm 10/20/2014   Addison's disease ([place])    Adrenal insufficiency ([place])    Allergy    Ambulates with cane    Anxiety    Arrhythmia    Cataract cortical, senile    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   Chronic kidney disease, stage 3 ([place]) 11/13/2011   Cough 03/29/2014   Edema leg 03/07/2017   Elevated cholesterol with high triglycerides 09/04/2013   Elevated red blood cell count    Erectile dysfunction    Falls frequently    Fever 03/29/2014   Generalized weakness 03/29/2014   GERD (gastroesophageal reflux disease)    Gout 09/04/2013   H/O adrenal insufficiency 05/17/2014   H/O atrial flutter 03/07/2017   H/O atrial flutter    H/O eye surgery    H/O sebaceous cyst 01/06/2018   H/O thyroid disease    H/O upper respiratory infection 10/15/2013   Hearing loss    History of back surgery    History of blepharoplasty    History of cancer 10/04/2015   History of chemotherapy    History of esophagogastroduodenoscopy (EGD)    History of prediabetes     Hyperlipidemia 06/16/2014   Hypertension    Hypertriglyceridemia    Hypothyroidism (acquired) 04/14/2014   Left leg swelling 11/23/2016   Low HDL (under 40)    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[place]) 11/10/2010   Malignant neoplasm of unspecified kidney, except renal pelvis (HCC)    OSA (obstructive sleep apnea) 07/24/2016   OSA (obstructive sleep apnea)    PAF (paroxysmal atrial fibrillation) (HCC)    PAF (paroxysmal atrial fibrillation) ([place])    Pancreatic mass 08/01/2018   Renal carcinoma ([place]) 06/16/2015   RLS (restless legs syndrome) 07/24/2016   Sleep apnea    Testicular hypofunction    Tubular adenoma 10/28/2017   Tussive syncopes 10/15/2013   Type 2 diabetes mellitus ([place])    Vitamin D deficiency disease     Past Surgical History:  Procedure Laterality Date   ADRENALECTOMY     CATARACT EXTRACTION  10/2014   CATARACT EXTRACTION EXTRACAPSULAR  11/05/2016   with Insertion Intraocular Prostheis.    COLONOSCOPY  05/06/2017   with removal lesions by snare.   COLONOSCOPY W/ BIOPSIES     05/06/2017   NEPHRECTOMY     POPLITEAL SYNOVIAL CYST EXCISION     SPINE SURGERY  1989   THYROIDECTOMY     THYROIDECTOMY, PARTIAL  TONSILLECTOMY      There were no vitals filed for this visit.   Subjective Assessment - 10/20/21 0803     Subjective Patient reports having some incidence that his left leg kept getting stuck behind him.    Patient is accompained by: Family member   [name]   Pertinent History Patient is a 76 year old male with referral from Neurology- Dr. Joselyn Arrow for unspecified abnormality of gait. He presents today with his wife and reports > 6 month history of progressive issues with poor balance and multiple falls. Patient has renal Cancer and currently has Pallative care- Was stabilized out of Hospice. Patient presents with the following past medical history: A-Fib and flutter, Lung Cancer, Obstructive sleep apnea, Upper respiratory infection, Addison's disease,  Hypothyroidism, Renal Cancer, DM, gout, arthritis, Chronic kidney disease and Restless leg syndrome. Patient lives with wife in independent living at [facility]. Prior level of function was independent with transfers and mobility using walker yet some assist with dressing.    Limitations Lifting;Standing;Walking;House hold activities    How long can you sit comfortably? No issues    How long can you stand comfortably? <5 min    How long can you walk comfortably? <5 min    Diagnostic tests EXAM:  MRI HEAD WITHOUT CONTRAST     TECHNIQUE:  Multiplanar, multiecho pulse sequences of the brain and surrounding  structures were obtained without intravenous contrast.     COMPARISON:  CT head without contrast 01/20/2021     FINDINGS:  Brain: Moderate generalized atrophy is present. 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 Ventricles are enlarged  bilaterally. There is mild ventricular asymmetry, the right is  larger.     No acute infarct, hemorrhage, or mass lesion is present. No other  significant extra-axial collection is present.     The internal auditory canals are within normal limits. The brainstem  and cerebellum are within normal limits.     Vascular: Flow is present in the major intracranial arteries.     Skull and upper cervical spine: The craniocervical junction is  normal. Upper cervical spine is within normal limits. Marrow signal  is unremarkable.     Sinuses/Orbits: The paranasal sinuses and mastoid air cells are  clear. Bilateral lens replacements are noted. Globes and orbits are  otherwise unremarkable.     IMPRESSION:  1. No acute intracranial abnormality.  2. Moderate generalized atrophy and white matter disease likely  reflects the sequela of chronic microvascular ischemia.  3. Arachnoid cyst over the left convexity.    Patient Stated Goals I would like to walk better and not fall    Currently in Pain? No/denies    Pain Onset In the past 7 days    Pain Onset More than a month ago               INTERVENTIONS:   Neuromuscular Re-education:    Forward/backward over 1/2 foam x 20 reps each- Min UE Support (min difficulty with Left LE going back over foam- Improved with practice)   Side stepping up/over 1/2 foam x 20 reps each with min UE Support    Ladder drills:   Forward (1 foot into a square- reciprocal) for increased step length on left x length of bars and back x 10 reps.   Retro gait: (left foot step backward over rung and right foot steps back onto rung for slight reciprocal steps)  x length of bars and back  x 10 reps  Side step in ladder- Both feet into each square x 5 reps down and back.    Education provided throughout session via VC/TC and demonstration to facilitate movement at target joints and correct muscle activation for all testing and exercises performed.                       PT Education - 10/20/21 1116     Education Details Exercise (muscle endurance) technique    Person(s) Educated Patient    Methods Explanation;Demonstration;Tactile cues;Verbal cues    Comprehension Verbalized understanding;Returned demonstration;Verbal cues required;Tactile cues required;Need further instruction              PT Short Term Goals - 08/21/21 0814       PT SHORT TERM GOAL #1   Title Pt will be independent with HEP in order to improve strength and balance in order to decrease fall risk and improve function at home and work.    Baseline 07/21/2021- Paitent presents with no formal HEP in place. 08/21/2021- Patient reports compliance with HEP and states no questions as of today- able to perform standin using his rollator for support.    Time 6    Period Weeks    Status Achieved    Target Date 09/01/21               PT Long Term Goals - 10/13/21 1526       PT LONG TERM GOAL #1   Title Pt will improve FOTO to target score of 53  to display perceived improvements in ability to complete ADL's.    Baseline 07/21/2021=  45; 1/5=51. 10/12/2021- Just reassessed for progress note so did not reassess again- will continue with goal into new certification    Time 12    Period Weeks    Status On-going    Target Date 01/05/22      PT LONG TERM GOAL #2   Title Pt will improve BERG by at least 5 points in order to demonstrate clinically significant improvement in balance.    Baseline 07/21/2021= 39/56; 08/21/2021=46/56- Will keep goal active to ensure patient able to test consistently; 10/05/2021= 52/56    Time 12    Period Weeks    Status Achieved    Target Date 10/13/21      PT LONG TERM GOAL #3   Title Pt will decrease 5TSTS by at least 5 seconds in order to demonstrate clinically significant improvement in LE strength.    Baseline 07/21/2021= 21 sec without UE Support; 11/21= 14.0 sec without UE support-.Will Keep goal active to ensure consistency.  10/05/2021=14.75 sec without UE support  (improved from 21 sec and unchanged from last assessment of 14 sec)    Time 12    Period Weeks    Status Achieved    Target Date 10/13/21      PT LONG TERM GOAL #4   Title Pt will decrease TUG to below  18 seconds/decrease in order to demonstrate decreased fall risk.    Baseline 07/21/2021= 23 sec with 4WW; 08/21/2021= 22.13 sec with 4WW; 10/05/2021= 19.51 sec avg with 4WW    Time 12    Period Weeks    Status On-going    Target Date 01/05/22      PT LONG TERM GOAL #5   Title Pt will increase 10MWT by at least 0.23 m/s in order to demonstrate clinically significant improvement in community ambulation.    Baseline 07/21/2021= 0.9m/s;  08/21/2021= 0.57 m/s using 4WW; 10/05/2021= 0.6 m/s avg using 4WW    Time 12    Period Weeks    Status On-going    Target Date 01/05/22                   Plan - 10/20/21 0809     Clinical Impression Statement Treatment focused on muscle endurance/coordination today. Patient exhibited improved ability to move Left LE today and performed very well with use of ladder for visual  feedback of location to land his left LE- Improving with step length and consistency- despite focus on endurance- left LE performed well with longer duration tasks - exhibiting only minor fatigue/weakness. The pt will benefit from further skilled PT to improve balance, strength, gait and safety with ADLs    Personal Factors and Comorbidities Comorbidity 3+    Comorbidities A- Fib, Renal cancer, Addison's disease, gout, arthritis, DM    Examination-Activity Limitations Caring for Others;Carry;Dressing;Lift;Reach Overhead;Squat;Stairs;Transfers;Stand    Examination-Participation Restrictions Community Activity;Cleaning;Driving;Yard Work;Shop    Stability/Clinical Decision Making Evolving/Moderate complexity    Rehab Potential Good    PT Frequency 2x / week    PT Duration 12 weeks    PT Treatment/Interventions ADLs/Self Care Home Management;Canalith Repostioning;Cryotherapy;Electrical Stimulation;Moist Heat;Traction;Ultrasound;DME Instruction;Gait training;Stair training;Functional mobility training;Therapeutic activities;Therapeutic exercise;Balance training;Neuromuscular re-education;Patient/family education;Orthotic Fit/Training;Wheelchair mobility training;Manual techniques;Passive range of motion;Dry needling;Energy conservation;Taping;Vestibular    PT Next Visit Plan Continue with progress LE strengthening; gait training for safe mobility, Neuromuscular re-ed for improved overall balance.    PT Home Exercise Plan 08/17/2021-Access Code: [email or web address]; no updates    Consulted and Agree with Plan of Care Patient;Family member/caregiver    Family Member Consulted WIfe- [name]             Patient will benefit from skilled therapeutic intervention in order to improve the following deficits and impairments:  Abnormal gait, Decreased activity tolerance, Decreased balance, Decreased coordination, Decreased endurance, Decreased knowledge of use of DME, Decreased mobility, Decreased strength, Difficulty  walking, Hypomobility, Impaired sensation, Impaired vision/preception, Postural dysfunction, Pain  Visit Diagnosis: Abnormality of gait and mobility  Difficulty in walking, not elsewhere classified  Muscle weakness (generalized)  Unsteadiness on feet     Problem List Patient Active Problem List   Diagnosis Date Noted   Addisons disease ([place]) 03/16/2021   Low HDL (under 40) 03/16/2021   Difficulty sleeping 11/21/2020   Numbness and tingling 11/21/2020   Difficulty walking 01/14/2020   History of recent fall 01/14/2020   Sleep disorder 01/14/2020   Pancreatic mass 08/01/2018   H/O sebaceous cyst 01/06/2018   Tubular adenoma 10/28/2017   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   Edema leg 03/07/2017   Hx of atrial flutter 03/07/2017   OSA (obstructive sleep apnea) 07/24/2016   RLS (restless legs syndrome) 07/24/2016   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   Atrial fibrillation and flutter ([place]) 10/20/2014   Hyperlipidemia 06/16/2014   Adrenal crisis ([place]) 04/19/2014   Cough 03/29/2014   Weakness 03/29/2014   Cough syncope 10/15/2013   URI (upper respiratory infection) 10/15/2013   Dyslipidemia 09/04/2013   Gout 09/04/2013   Chronic kidney disease, stage III (moderate) ([place]) 11/13/2011   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   Hypothyroidism (acquired) 11/30/2010   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[place]) 11/10/2010    Lewis Moccasin, PT 10/20/2021, 11:22 AM  [place] MAIN [facility] SERVICES [address] [place], [place], [address] Phone: [phone]   Fax:  [phone]  Name: Tony Huffman  MRN: [id number] Date of Birth: 20-Dec-1945

## 2021-10-24 ENCOUNTER — Ambulatory Visit: Payer: Medicare Other

## 2021-10-24 ENCOUNTER — Other Ambulatory Visit: Payer: Self-pay

## 2021-10-24 DIAGNOSIS — R269 Unspecified abnormalities of gait and mobility: Secondary | ICD-10-CM

## 2021-10-24 DIAGNOSIS — M6281 Muscle weakness (generalized): Secondary | ICD-10-CM

## 2021-10-24 DIAGNOSIS — R262 Difficulty in walking, not elsewhere classified: Secondary | ICD-10-CM

## 2021-10-24 DIAGNOSIS — R2681 Unsteadiness on feet: Secondary | ICD-10-CM

## 2021-10-24 NOTE — Therapy (Signed)
[place] MAIN [facility] SERVICES [address] [place], [place], [address] Phone: [phone]   Fax:  [phone]  Physical Therapy Treatment  Patient Details  Name: Tony Huffman MRN: [id number] Date of Birth: Feb 25, 1946 Referring Provider (PT): Dr. Joselyn Arrow   Encounter Date: 10/24/2021   PT End of Session - 10/24/21 0800     Visit Number 25    Number of Visits 60    Date for PT Re-Evaluation 01/05/22    Authorization Type Medicare Part B    Authorization Time Period 07/21/2021- 10/13/2021; 10/13/2021-01/05/2022    Progress Note Due on Visit 30    PT Start Time 0800    PT Stop Time 0844    PT Time Calculation (min) 44 min    Equipment Utilized During Treatment Gait belt;Other (comment)    Activity Tolerance Patient tolerated treatment well    Behavior During Therapy [facility] for tasks assessed/performed             Past Medical History:  Diagnosis Date   Abnormal heart rhythm 10/20/2014   Addison's disease ([place])    Adrenal insufficiency ([place])    Allergy    Ambulates with cane    Anxiety    Arrhythmia    Cataract cortical, senile    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   Chronic kidney disease, stage 3 ([place]) 11/13/2011   Cough 03/29/2014   Edema leg 03/07/2017   Elevated cholesterol with high triglycerides 09/04/2013   Elevated red blood cell count    Erectile dysfunction    Falls frequently    Fever 03/29/2014   Generalized weakness 03/29/2014   GERD (gastroesophageal reflux disease)    Gout 09/04/2013   H/O adrenal insufficiency 05/17/2014   H/O atrial flutter 03/07/2017   H/O atrial flutter    H/O eye surgery    H/O sebaceous cyst 01/06/2018   H/O thyroid disease    H/O upper respiratory infection 10/15/2013   Hearing loss    History of back surgery    History of blepharoplasty    History of cancer 10/04/2015   History of chemotherapy    History of esophagogastroduodenoscopy (EGD)    History of prediabetes     Hyperlipidemia 06/16/2014   Hypertension    Hypertriglyceridemia    Hypothyroidism (acquired) 04/14/2014   Left leg swelling 11/23/2016   Low HDL (under 40)    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[place]) 11/10/2010   Malignant neoplasm of unspecified kidney, except renal pelvis (HCC)    OSA (obstructive sleep apnea) 07/24/2016   OSA (obstructive sleep apnea)    PAF (paroxysmal atrial fibrillation) (HCC)    PAF (paroxysmal atrial fibrillation) ([place])    Pancreatic mass 08/01/2018   Renal carcinoma ([place]) 06/16/2015   RLS (restless legs syndrome) 07/24/2016   Sleep apnea    Testicular hypofunction    Tubular adenoma 10/28/2017   Tussive syncopes 10/15/2013   Type 2 diabetes mellitus ([place])    Vitamin D deficiency disease     Past Surgical History:  Procedure Laterality Date   ADRENALECTOMY     CATARACT EXTRACTION  10/2014   CATARACT EXTRACTION EXTRACAPSULAR  11/05/2016   with Insertion Intraocular Prostheis.    COLONOSCOPY  05/06/2017   with removal lesions by snare.   COLONOSCOPY W/ BIOPSIES     05/06/2017   NEPHRECTOMY     POPLITEAL SYNOVIAL CYST EXCISION     SPINE SURGERY  1989   THYROIDECTOMY     THYROIDECTOMY, PARTIAL  TONSILLECTOMY      There were no vitals filed for this visit.   Subjective Assessment - 10/24/21 0800     Subjective Patient denies any new changes. Denies any falls and no pain today.    Patient is accompained by: Family member   [name]   Pertinent History Patient is a 76 year old male with referral from Neurology- Dr. Joselyn Arrow for unspecified abnormality of gait. He presents today with his wife and reports > 6 month history of progressive issues with poor balance and multiple falls. Patient has renal Cancer and currently has Pallative care- Was stabilized out of Hospice. Patient presents with the following past medical history: A-Fib and flutter, Lung Cancer, Obstructive sleep apnea, Upper respiratory infection, Addison's disease, Hypothyroidism,  Renal Cancer, DM, gout, arthritis, Chronic kidney disease and Restless leg syndrome. Patient lives with wife in independent living at [facility]. Prior level of function was independent with transfers and mobility using walker yet some assist with dressing.    Limitations Lifting;Standing;Walking;House hold activities    How long can you sit comfortably? No issues    How long can you stand comfortably? <5 min    How long can you walk comfortably? <5 min    Diagnostic tests EXAM:  MRI HEAD WITHOUT CONTRAST     TECHNIQUE:  Multiplanar, multiecho pulse sequences of the brain and surrounding  structures were obtained without intravenous contrast.     COMPARISON:  CT head without contrast 01/20/2021     FINDINGS:  Brain: Moderate generalized atrophy is present. 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 Ventricles are enlarged  bilaterally. There is mild ventricular asymmetry, the right is  larger.     No acute infarct, hemorrhage, or mass lesion is present. No other  significant extra-axial collection is present.     The internal auditory canals are within normal limits. The brainstem  and cerebellum are within normal limits.     Vascular: Flow is present in the major intracranial arteries.     Skull and upper cervical spine: The craniocervical junction is  normal. Upper cervical spine is within normal limits. Marrow signal  is unremarkable.     Sinuses/Orbits: The paranasal sinuses and mastoid air cells are  clear. Bilateral lens replacements are noted. Globes and orbits are  otherwise unremarkable.     IMPRESSION:  1. No acute intracranial abnormality.  2. Moderate generalized atrophy and white matter disease likely  reflects the sequela of chronic microvascular ischemia.  3. Arachnoid cyst over the left convexity.    Patient Stated Goals I would like to walk better and not fall    Currently in Pain? No/denies    Pain Onset In the past 7 days                 INTERVENTIONS:  Neuromuscular re-education:   Forward/backward steps over 1/2 foam with BUE support in cadence x 15 reps each LE.   Side stepping over 1/2 foam pad with cadence of metronome at 40bpm (min BUE Support) - Difficulty keeping up with cadence but did improve with use of metronome   Dynamic step tap onto colored hedgeghogs (4- red, blue, yellow, green)  - focusing on timing and accuracy- Right LE = 5.86 sec and 5.75 sec and left LE = 7.75 sec and 6.85 sec respectively.   Dual tasking activity:   Separating a deck of cards into black and red cards onto a tray table while standing staggered  on airex pad - with 1 switch of feet- Mild unsteadiness and challenged with dual task- slow with reaction at times.   Reading pamphlet while marching with metronome and airex pad Reading pamphelt while marching on firm surface and no metronome  Step tap while calling out animal names x 45 sec (patient slowed down and difficulty dual tasking. On 2nd trial- able to keep marching even despite slowing down on calling out names of animals   Education provided throughout session via VC/TC and demonstration to facilitate movement at target joints and correct muscle activation for all testing and exercises performed.                      PT Education - 10/24/21 0800     Education Details Exercise technique    Person(s) Educated Patient    Methods Demonstration;Explanation;Tactile cues;Verbal cues    Comprehension Verbalized understanding;Returned demonstration;Verbal cues required;Tactile cues required;Need further instruction              PT Short Term Goals - 08/21/21 0814       PT SHORT TERM GOAL #1   Title Pt will be independent with HEP in order to improve strength and balance in order to decrease fall risk and improve function at home and work.    Baseline 07/21/2021- Paitent presents with no formal HEP in place. 08/21/2021- Patient reports compliance  with HEP and states no questions as of today- able to perform standin using his rollator for support.    Time 6    Period Weeks    Status Achieved    Target Date 09/01/21               PT Long Term Goals - 10/13/21 1526       PT LONG TERM GOAL #1   Title Pt will improve FOTO to target score of 53  to display perceived improvements in ability to complete ADL's.    Baseline 07/21/2021= 45; 1/5=51. 10/12/2021- Just reassessed for progress note so did not reassess again- will continue with goal into new certification    Time 12    Period Weeks    Status On-going    Target Date 01/05/22      PT LONG TERM GOAL #2   Title Pt will improve BERG by at least 5 points in order to demonstrate clinically significant improvement in balance.    Baseline 07/21/2021= 39/56; 08/21/2021=46/56- Will keep goal active to ensure patient able to test consistently; 10/05/2021= 52/56    Time 12    Period Weeks    Status Achieved    Target Date 10/13/21      PT LONG TERM GOAL #3   Title Pt will decrease 5TSTS by at least 5 seconds in order to demonstrate clinically significant improvement in LE strength.    Baseline 07/21/2021= 21 sec without UE Support; 11/21= 14.0 sec without UE support-.Will Keep goal active to ensure consistency.  10/05/2021=14.75 sec without UE support  (improved from 21 sec and unchanged from last assessment of 14 sec)    Time 12    Period Weeks    Status Achieved    Target Date 10/13/21      PT LONG TERM GOAL #4   Title Pt will decrease TUG to below  18 seconds/decrease in order to demonstrate decreased fall risk.    Baseline 07/21/2021= 23 sec with 4WW; 08/21/2021= 22.13 sec with 4WW; 10/05/2021= 19.51 sec avg with 4WW    Time 12  Period Weeks    Status On-going    Target Date 01/05/22      PT LONG TERM GOAL #5   Title Pt will increase 10MWT by at least 0.23 m/s in order to demonstrate clinically significant improvement in community ambulation.    Baseline 07/21/2021=  0.55m/s; 08/21/2021= 0.57 m/s using 4WW; 10/05/2021= 0.6 m/s avg using 4WW    Time 12    Period Weeks    Status On-going    Target Date 01/05/22                   Plan - 10/24/21 0805     Clinical Impression Statement Patient presented with excellent motivation today and participated well with challenging dual task activities. Patient was able to demonstrate some progress - able to adjust to external stimuli of 2nd task and make the primary task of maintaining balance or marching more automatic. He was fatigued at end of session but performed well overall. The pt will benefit from further skilled PT to improve balance, strength, gait and safety with ADLs    Personal Factors and Comorbidities Comorbidity 3+    Comorbidities A- Fib, Renal cancer, Addison's disease, gout, arthritis, DM    Examination-Activity Limitations Caring for Others;Carry;Dressing;Lift;Reach Overhead;Squat;Stairs;Transfers;Stand    Examination-Participation Restrictions Community Activity;Cleaning;Driving;Yard Work;Shop    Stability/Clinical Decision Making Evolving/Moderate complexity    Rehab Potential Good    PT Frequency 2x / week    PT Duration 12 weeks    PT Treatment/Interventions ADLs/Self Care Home Management;Canalith Repostioning;Cryotherapy;Electrical Stimulation;Moist Heat;Traction;Ultrasound;DME Instruction;Gait training;Stair training;Functional mobility training;Therapeutic activities;Therapeutic exercise;Balance training;Neuromuscular re-education;Patient/family education;Orthotic Fit/Training;Wheelchair mobility training;Manual techniques;Passive range of motion;Dry needling;Energy conservation;Taping;Vestibular    PT Next Visit Plan Continue with progress LE strengthening; gait training for safe mobility, Neuromuscular re-ed for improved overall balance.    PT Home Exercise Plan 08/17/2021-Access Code: [email or web address]; no updates    Consulted and Agree with Plan of Care Patient;Family member/caregiver     Family Member Consulted WIfe- [name]             Patient will benefit from skilled therapeutic intervention in order to improve the following deficits and impairments:  Abnormal gait, Decreased activity tolerance, Decreased balance, Decreased coordination, Decreased endurance, Decreased knowledge of use of DME, Decreased mobility, Decreased strength, Difficulty walking, Hypomobility, Impaired sensation, Impaired vision/preception, Postural dysfunction, Pain  Visit Diagnosis: Abnormality of gait and mobility  Difficulty in walking, not elsewhere classified  Muscle weakness (generalized)  Unsteadiness on feet     Problem List Patient Active Problem List   Diagnosis Date Noted   Addisons disease ([place]) 03/16/2021   Low HDL (under 40) 03/16/2021   Difficulty sleeping 11/21/2020   Numbness and tingling 11/21/2020   Difficulty walking 01/14/2020   History of recent fall 01/14/2020   Sleep disorder 01/14/2020   Pancreatic mass 08/01/2018   H/O sebaceous cyst 01/06/2018   Tubular adenoma 10/28/2017   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   Edema leg 03/07/2017   Hx of atrial flutter 03/07/2017   OSA (obstructive sleep apnea) 07/24/2016   RLS (restless legs syndrome) 07/24/2016   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   Atrial fibrillation and flutter ([place]) 10/20/2014   Hyperlipidemia 06/16/2014   Adrenal crisis ([place]) 04/19/2014   Cough 03/29/2014   Weakness 03/29/2014   Cough syncope 10/15/2013   URI (upper respiratory infection) 10/15/2013   Dyslipidemia 09/04/2013   Gout 09/04/2013   Chronic kidney disease, stage III (moderate) ([place]) 11/13/2011   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   Hypothyroidism (  acquired) 11/30/2010   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[place]) 11/10/2010    Lewis Moccasin, PT 10/24/2021, 12:48 PM  [place] MAIN [facility] SERVICES [address] [place], [place], [address] Phone:  [phone]   Fax:  [phone]  Name: Jordan Pardini MRN: [id number] Date of Birth: 03-22-1946

## 2021-10-27 ENCOUNTER — Ambulatory Visit: Payer: Medicare Other

## 2021-10-27 ENCOUNTER — Other Ambulatory Visit: Payer: Self-pay

## 2021-10-27 DIAGNOSIS — M6281 Muscle weakness (generalized): Secondary | ICD-10-CM

## 2021-10-27 DIAGNOSIS — R269 Unspecified abnormalities of gait and mobility: Secondary | ICD-10-CM | POA: Diagnosis not present

## 2021-10-27 DIAGNOSIS — R2681 Unsteadiness on feet: Secondary | ICD-10-CM

## 2021-10-27 DIAGNOSIS — R262 Difficulty in walking, not elsewhere classified: Secondary | ICD-10-CM

## 2021-10-27 NOTE — Therapy (Signed)
[place] MAIN [facility] SERVICES [address] [place], [place], [address] Phone: [phone]   Fax:  [phone]  Physical Therapy Treatment  Patient Details  Name: Tony Huffman MRN: [id number] Date of Birth: 1946/06/19 Referring Provider (PT): Dr. Joselyn Arrow   Encounter Date: 10/27/2021   PT End of Session - 10/27/21 0808     Visit Number 26    Number of Visits 30    Date for PT Re-Evaluation 01/05/22    Authorization Type Medicare Part B    Authorization Time Period 07/21/2021- 10/13/2021; 10/13/2021-01/05/2022    Progress Note Due on Visit 30    PT Start Time 0800    PT Stop Time 0845    PT Time Calculation (min) 45 min    Equipment Utilized During Treatment Gait belt;Other (comment)    Activity Tolerance Patient tolerated treatment well    Behavior During Therapy [facility] for tasks assessed/performed             Past Medical History:  Diagnosis Date   Abnormal heart rhythm 10/20/2014   Addison's disease ([place])    Adrenal insufficiency ([place])    Allergy    Ambulates with cane    Anxiety    Arrhythmia    Cataract cortical, senile    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   Chronic kidney disease, stage 3 ([place]) 11/13/2011   Cough 03/29/2014   Edema leg 03/07/2017   Elevated cholesterol with high triglycerides 09/04/2013   Elevated red blood cell count    Erectile dysfunction    Falls frequently    Fever 03/29/2014   Generalized weakness 03/29/2014   GERD (gastroesophageal reflux disease)    Gout 09/04/2013   H/O adrenal insufficiency 05/17/2014   H/O atrial flutter 03/07/2017   H/O atrial flutter    H/O eye surgery    H/O sebaceous cyst 01/06/2018   H/O thyroid disease    H/O upper respiratory infection 10/15/2013   Hearing loss    History of back surgery    History of blepharoplasty    History of cancer 10/04/2015   History of chemotherapy    History of esophagogastroduodenoscopy (EGD)    History of prediabetes     Hyperlipidemia 06/16/2014   Hypertension    Hypertriglyceridemia    Hypothyroidism (acquired) 04/14/2014   Left leg swelling 11/23/2016   Low HDL (under 40)    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[place]) 11/10/2010   Malignant neoplasm of unspecified kidney, except renal pelvis (HCC)    OSA (obstructive sleep apnea) 07/24/2016   OSA (obstructive sleep apnea)    PAF (paroxysmal atrial fibrillation) (HCC)    PAF (paroxysmal atrial fibrillation) ([place])    Pancreatic mass 08/01/2018   Renal carcinoma ([place]) 06/16/2015   RLS (restless legs syndrome) 07/24/2016   Sleep apnea    Testicular hypofunction    Tubular adenoma 10/28/2017   Tussive syncopes 10/15/2013   Type 2 diabetes mellitus ([place])    Vitamin D deficiency disease     Past Surgical History:  Procedure Laterality Date   ADRENALECTOMY     CATARACT EXTRACTION  10/2014   CATARACT EXTRACTION EXTRACAPSULAR  11/05/2016   with Insertion Intraocular Prostheis.    COLONOSCOPY  05/06/2017   with removal lesions by snare.   COLONOSCOPY W/ BIOPSIES     05/06/2017   NEPHRECTOMY     POPLITEAL SYNOVIAL CYST EXCISION     SPINE SURGERY  1989   THYROIDECTOMY     THYROIDECTOMY, PARTIAL  TONSILLECTOMY      There were no vitals filed for this visit.   Subjective Assessment - 10/27/21 0807     Subjective Patient states he was able to get up and dress himself and drove to PT on his own today. He states he did okay but missing his wife who is vacating in [place].    Patient is accompained by: Family member   [name]   Pertinent History Patient is a 76 year old male with referral from Neurology- Dr. Joselyn Arrow for unspecified abnormality of gait. He presents today with his wife and reports > 6 month history of progressive issues with poor balance and multiple falls. Patient has renal Cancer and currently has Pallative care- Was stabilized out of Hospice. Patient presents with the following past medical history: A-Fib and flutter, Lung  Cancer, Obstructive sleep apnea, Upper respiratory infection, Addison's disease, Hypothyroidism, Renal Cancer, DM, gout, arthritis, Chronic kidney disease and Restless leg syndrome. Patient lives with wife in independent living at [facility]. Prior level of function was independent with transfers and mobility using walker yet some assist with dressing.    Limitations Lifting;Standing;Walking;House hold activities    How long can you sit comfortably? No issues    How long can you stand comfortably? <5 min    How long can you walk comfortably? <5 min    Diagnostic tests EXAM:  MRI HEAD WITHOUT CONTRAST     TECHNIQUE:  Multiplanar, multiecho pulse sequences of the brain and surrounding  structures were obtained without intravenous contrast.     COMPARISON:  CT head without contrast 01/20/2021     FINDINGS:  Brain: Moderate generalized atrophy is present. 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 Ventricles are enlarged  bilaterally. There is mild ventricular asymmetry, the right is  larger.     No acute infarct, hemorrhage, or mass lesion is present. No other  significant extra-axial collection is present.     The internal auditory canals are within normal limits. The brainstem  and cerebellum are within normal limits.     Vascular: Flow is present in the major intracranial arteries.     Skull and upper cervical spine: The craniocervical junction is  normal. Upper cervical spine is within normal limits. Marrow signal  is unremarkable.     Sinuses/Orbits: The paranasal sinuses and mastoid air cells are  clear. Bilateral lens replacements are noted. Globes and orbits are  otherwise unremarkable.     IMPRESSION:  1. No acute intracranial abnormality.  2. Moderate generalized atrophy and white matter disease likely  reflects the sequela of chronic microvascular ischemia.  3. Arachnoid cyst over the left convexity.    Patient Stated Goals I would like to walk better and not fall    Currently in Pain?  No/denies    Pain Onset --              INTERVENTIONS:   Therapeutic exercises:   HIIT training on Nustep- Seat level 9; LE only  52min 30 sec at level 1 30 sec at level 4  Repeat for 6 min total; Total distance= 17 HR=92 bpm; O2 sat=93%; RPE= 5/10   Neuromuscular re-education:    Step over progression in // bars (with BUE support)   1) step over red theraband placed on floor - x 10 reps each leg- Over the band and step backward.  2) Added blue 1/2 foam on top of therand for added height- 8 reps each  leg up and over.  *patient reported more soreness in shoulders from UE support  3) added 3lb AW BLE over 1/2 foam and theraband x 6 rep. Patient reported as Hard- trying to coordinate which foot to lead.    Dual task activities: (at support bar with dry erase board on wall) - all activities performed with CGA and use of gait belt.   1) while standing with feet narrowed on blue airex pad - Patient instructed to find letter magnets and spell out his first and last name. Patient with initial mild difficulty coordinating task but no loss of balance.   2) Same as 1 - but instructed to spell out current month- Mild unsteadiness with VC to not use UE support unless off balance.   3) Game of hangman x 2 with feet in staggered position (switching feet 1-2 times during each game). Patient only required min VC to keep hand off bar to focus on balance.   4) Game of hangman x  1 with 1 foot on blue airex pad and front foot on 6" step box- No significant loss of balance.    Education provided throughout session via VC/TC and demonstration to facilitate movement at target joints and correct muscle activation for all testing and exercises performed.   Patient performed well overall today - presented with good motivation- No significant difficulty using RPE scale with nustep interval training. He was able to progress with more challenging balance positions and complete dual tasking without  significant compromise on primary activity of balance.The pt will benefit from further skilled PT to improve balance, strength, gait and safety with ADLs                       PT Education - 10/27/21 0807     Education Details Exercise technique    Person(s) Educated Patient    Methods Explanation;Demonstration;Tactile cues;Verbal cues    Comprehension Verbalized understanding;Returned demonstration;Verbal cues required;Tactile cues required;Need further instruction              PT Short Term Goals - 08/21/21 0814       PT SHORT TERM GOAL #1   Title Pt will be independent with HEP in order to improve strength and balance in order to decrease fall risk and improve function at home and work.    Baseline 07/21/2021- Paitent presents with no formal HEP in place. 08/21/2021- Patient reports compliance with HEP and states no questions as of today- able to perform standin using his rollator for support.    Time 6    Period Weeks    Status Achieved    Target Date 09/01/21               PT Long Term Goals - 10/13/21 1526       PT LONG TERM GOAL #1   Title Pt will improve FOTO to target score of 53  to display perceived improvements in ability to complete ADL's.    Baseline 07/21/2021= 45; 1/5=51. 10/12/2021- Just reassessed for progress note so did not reassess again- will continue with goal into new certification    Time 12    Period Weeks    Status On-going    Target Date 01/05/22      PT LONG TERM GOAL #2   Title Pt will improve BERG by at least 5 points in order to demonstrate clinically significant improvement in balance.    Baseline 07/21/2021= 39/56; 08/21/2021=46/56- Will keep goal active to  ensure patient able to test consistently; 10/05/2021= 52/56    Time 12    Period Weeks    Status Achieved    Target Date 10/13/21      PT LONG TERM GOAL #3   Title Pt will decrease 5TSTS by at least 5 seconds in order to demonstrate clinically significant  improvement in LE strength.    Baseline 07/21/2021= 21 sec without UE Support; 11/21= 14.0 sec without UE support-.Will Keep goal active to ensure consistency.  10/05/2021=14.75 sec without UE support  (improved from 21 sec and unchanged from last assessment of 14 sec)    Time 12    Period Weeks    Status Achieved    Target Date 10/13/21      PT LONG TERM GOAL #4   Title Pt will decrease TUG to below  18 seconds/decrease in order to demonstrate decreased fall risk.    Baseline 07/21/2021= 23 sec with 4WW; 08/21/2021= 22.13 sec with 4WW; 10/05/2021= 19.51 sec avg with 4WW    Time 12    Period Weeks    Status On-going    Target Date 01/05/22      PT LONG TERM GOAL #5   Title Pt will increase 10MWT by at least 0.23 m/s in order to demonstrate clinically significant improvement in community ambulation.    Baseline 07/21/2021= 0.64m/s; 08/21/2021= 0.57 m/s using 4WW; 10/05/2021= 0.6 m/s avg using 4WW    Time 12    Period Weeks    Status On-going    Target Date 01/05/22                   Plan - 10/27/21 0808     Clinical Impression Statement Patient performed well overall today - presented with good motivation- No significant difficulty using RPE scale with nustep interval training. He was able to progress with more challenging balance positions and complete dual tasking without significant compromise on primary activity of balance.The pt will benefit from further skilled PT to improve balance, strength, gait and safety with ADLs    Personal Factors and Comorbidities Comorbidity 3+    Comorbidities A- Fib, Renal cancer, Addison's disease, gout, arthritis, DM    Examination-Activity Limitations Caring for Others;Carry;Dressing;Lift;Reach Overhead;Squat;Stairs;Transfers;Stand    Examination-Participation Restrictions Community Activity;Cleaning;Driving;Yard Work;Shop    Stability/Clinical Decision Making Evolving/Moderate complexity    Rehab Potential Good    PT Frequency 2x / week     PT Duration 12 weeks    PT Treatment/Interventions ADLs/Self Care Home Management;Canalith Repostioning;Cryotherapy;Electrical Stimulation;Moist Heat;Traction;Ultrasound;DME Instruction;Gait training;Stair training;Functional mobility training;Therapeutic activities;Therapeutic exercise;Balance training;Neuromuscular re-education;Patient/family education;Orthotic Fit/Training;Wheelchair mobility training;Manual techniques;Passive range of motion;Dry needling;Energy conservation;Taping;Vestibular    PT Next Visit Plan Continue with progress LE strengthening; gait training for safe mobility, Neuromuscular re-ed for improved overall balance.    PT Home Exercise Plan 08/17/2021-Access Code: [email or web address]; no updates    Consulted and Agree with Plan of Care Patient;Family member/caregiver    Family Member Consulted WIfe- [name]             Patient will benefit from skilled therapeutic intervention in order to improve the following deficits and impairments:  Abnormal gait, Decreased activity tolerance, Decreased balance, Decreased coordination, Decreased endurance, Decreased knowledge of use of DME, Decreased mobility, Decreased strength, Difficulty walking, Hypomobility, Impaired sensation, Impaired vision/preception, Postural dysfunction, Pain  Visit Diagnosis: Abnormality of gait and mobility  Difficulty in walking, not elsewhere classified  Muscle weakness (generalized)  Unsteadiness on feet     Problem List Patient Active Problem List  Diagnosis Date Noted   Addisons disease ([place]) 03/16/2021   Low HDL (under 40) 03/16/2021   Difficulty sleeping 11/21/2020   Numbness and tingling 11/21/2020   Difficulty walking 01/14/2020   History of recent fall 01/14/2020   Sleep disorder 01/14/2020   Pancreatic mass 08/01/2018   H/O sebaceous cyst 01/06/2018   Tubular adenoma 10/28/2017   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   Edema leg 03/07/2017   Hx of atrial flutter 03/07/2017   OSA  (obstructive sleep apnea) 07/24/2016   RLS (restless legs syndrome) 07/24/2016   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   Atrial fibrillation and flutter ([place]) 10/20/2014   Hyperlipidemia 06/16/2014   Adrenal crisis ([place]) 04/19/2014   Cough 03/29/2014   Weakness 03/29/2014   Cough syncope 10/15/2013   URI (upper respiratory infection) 10/15/2013   Dyslipidemia 09/04/2013   Gout 09/04/2013   Chronic kidney disease, stage III (moderate) ([place]) 11/13/2011   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   Hypothyroidism (acquired) 11/30/2010   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[place]) 11/10/2010    Lewis Moccasin, PT 10/27/2021, 9:18 AM  [place] MAIN [facility] SERVICES [address] [place], [place], [address] Phone: [phone]   Fax:  [phone]  Name: Tony Huffman MRN: [id number] Date of Birth: 07-25-1946

## 2021-10-31 ENCOUNTER — Ambulatory Visit (INDEPENDENT_AMBULATORY_CARE_PROVIDER_SITE_OTHER): Payer: Medicare Other | Admitting: Podiatry

## 2021-10-31 ENCOUNTER — Ambulatory Visit: Payer: Medicare Other

## 2021-10-31 ENCOUNTER — Other Ambulatory Visit: Payer: Self-pay

## 2021-10-31 DIAGNOSIS — M7662 Achilles tendinitis, left leg: Secondary | ICD-10-CM

## 2021-10-31 DIAGNOSIS — R262 Difficulty in walking, not elsewhere classified: Secondary | ICD-10-CM

## 2021-10-31 DIAGNOSIS — M6281 Muscle weakness (generalized): Secondary | ICD-10-CM

## 2021-10-31 DIAGNOSIS — R2681 Unsteadiness on feet: Secondary | ICD-10-CM

## 2021-10-31 DIAGNOSIS — R269 Unspecified abnormalities of gait and mobility: Secondary | ICD-10-CM | POA: Diagnosis not present

## 2021-10-31 NOTE — Therapy (Signed)
[place] MAIN [facility] SERVICES [address] [place], [place], [address] Phone: [phone]   Fax:  [phone]  Physical Therapy Treatment  Patient Details  Name: Tony Huffman MRN: [id number] Date of Birth: 08/04/1946 Referring Provider (PT): Dr. Joselyn Arrow   Encounter Date: 10/31/2021   PT End of Session - 10/31/21 0810     Visit Number 27    Number of Visits 84    Date for PT Re-Evaluation 01/05/22    Authorization Type Medicare Part B    Authorization Time Period 07/21/2021- 10/13/2021; 10/13/2021-01/05/2022    Progress Note Due on Visit 30    PT Start Time 0801    PT Stop Time 0845    PT Time Calculation (min) 44 min    Equipment Utilized During Treatment Gait belt;Other (comment)    Activity Tolerance Patient tolerated treatment well    Behavior During Therapy [facility] for tasks assessed/performed             Past Medical History:  Diagnosis Date   Abnormal heart rhythm 10/20/2014   Addison's disease ([place])    Adrenal insufficiency ([place])    Allergy    Ambulates with cane    Anxiety    Arrhythmia    Cataract cortical, senile    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   Chronic kidney disease, stage 3 ([place]) 11/13/2011   Cough 03/29/2014   Edema leg 03/07/2017   Elevated cholesterol with high triglycerides 09/04/2013   Elevated red blood cell count    Erectile dysfunction    Falls frequently    Fever 03/29/2014   Generalized weakness 03/29/2014   GERD (gastroesophageal reflux disease)    Gout 09/04/2013   H/O adrenal insufficiency 05/17/2014   H/O atrial flutter 03/07/2017   H/O atrial flutter    H/O eye surgery    H/O sebaceous cyst 01/06/2018   H/O thyroid disease    H/O upper respiratory infection 10/15/2013   Hearing loss    History of back surgery    History of blepharoplasty    History of cancer 10/04/2015   History of chemotherapy    History of esophagogastroduodenoscopy (EGD)    History of prediabetes     Hyperlipidemia 06/16/2014   Hypertension    Hypertriglyceridemia    Hypothyroidism (acquired) 04/14/2014   Left leg swelling 11/23/2016   Low HDL (under 40)    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[place]) 11/10/2010   Malignant neoplasm of unspecified kidney, except renal pelvis (HCC)    OSA (obstructive sleep apnea) 07/24/2016   OSA (obstructive sleep apnea)    PAF (paroxysmal atrial fibrillation) (HCC)    PAF (paroxysmal atrial fibrillation) ([place])    Pancreatic mass 08/01/2018   Renal carcinoma ([place]) 06/16/2015   RLS (restless legs syndrome) 07/24/2016   Sleep apnea    Testicular hypofunction    Tubular adenoma 10/28/2017   Tussive syncopes 10/15/2013   Type 2 diabetes mellitus ([place])    Vitamin D deficiency disease     Past Surgical History:  Procedure Laterality Date   ADRENALECTOMY     CATARACT EXTRACTION  10/2014   CATARACT EXTRACTION EXTRACAPSULAR  11/05/2016   with Insertion Intraocular Prostheis.    COLONOSCOPY  05/06/2017   with removal lesions by snare.   COLONOSCOPY W/ BIOPSIES     05/06/2017   NEPHRECTOMY     POPLITEAL SYNOVIAL CYST EXCISION     SPINE SURGERY  1989   THYROIDECTOMY     THYROIDECTOMY, PARTIAL  TONSILLECTOMY      There were no vitals filed for this visit.   Subjective Assessment - 10/31/21 0808     Subjective Patient reports doing okay- No complaints and nothing to brag about it.    Patient is accompained by: Family member   [name]   Pertinent History Patient is a 76 year old male with referral from Neurology- Dr. Joselyn Arrow for unspecified abnormality of gait. He presents today with his wife and reports > 6 month history of progressive issues with poor balance and multiple falls. Patient has renal Cancer and currently has Pallative care- Was stabilized out of Hospice. Patient presents with the following past medical history: A-Fib and flutter, Lung Cancer, Obstructive sleep apnea, Upper respiratory infection, Addison's disease,  Hypothyroidism, Renal Cancer, DM, gout, arthritis, Chronic kidney disease and Restless leg syndrome. Patient lives with wife in independent living at [facility] [place]. Prior level of function was independent with transfers and mobility using walker yet some assist with dressing.    Limitations Lifting;Standing;Walking;House hold activities    How long can you sit comfortably? No issues    How long can you stand comfortably? <5 min    How long can you walk comfortably? <5 min    Diagnostic tests EXAM:  MRI HEAD WITHOUT CONTRAST     TECHNIQUE:  Multiplanar, multiecho pulse sequences of the brain and surrounding  structures were obtained without intravenous contrast.     COMPARISON:  CT head without contrast 01/20/2021     FINDINGS:  Brain: Moderate generalized atrophy is present. 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 Ventricles are enlarged  bilaterally. There is mild ventricular asymmetry, the right is  larger.     No acute infarct, hemorrhage, or mass lesion is present. No other  significant extra-axial collection is present.     The internal auditory canals are within normal limits. The brainstem  and cerebellum are within normal limits.     Vascular: Flow is present in the major intracranial arteries.     Skull and upper cervical spine: The craniocervical junction is  normal. Upper cervical spine is within normal limits. Marrow signal  is unremarkable.     Sinuses/Orbits: The paranasal sinuses and mastoid air cells are  clear. Bilateral lens replacements are noted. Globes and orbits are  otherwise unremarkable.     IMPRESSION:  1. No acute intracranial abnormality.  2. Moderate generalized atrophy and white matter disease likely  reflects the sequela of chronic microvascular ischemia.  3. Arachnoid cyst over the left convexity.    Patient Stated Goals I would like to walk better and not fall    Currently in Pain? No/denies             INTERVENTIONS:   Neuromusular re-education:    Forward step over 1/2 foam x 2 in // bars x 10 times down and back including focus on 180 turn.  No loss of balance and good reciprocal steps   Side step over 1/2 foam x 2 in // bars x 10 times down and back including focus on 180 turn.    Ladder drill- Forward walking (1 foot into one square for reciprocal steps) x length of // bars and back x 10 to try to automate the reciprocal steps then progressed immediately to:   Gait with SPC- around 100 feet - Initially with good reciprocal steps yet reverted back to more shuffling with   Cones- positioned 5 cones and patient has to  walk to called out colored cone - Ex. "Yellow" and patient walked up to cone and squatted- touching top of cane   Cones- Tapping corresponding called out color by staring in middle with cones position to left, left forward, forward, right forward and right.   4 corners- Forward from 1st cone to 2nd cone- then side step to right then backward, then side step. Patient demo increased difficulty with backward walking - Shuffling at times.                       PT Short Term Goals - 08/21/21 0814       PT SHORT TERM GOAL #1   Title Pt will be independent with HEP in order to improve strength and balance in order to decrease fall risk and improve function at home and work.    Baseline 07/21/2021- Paitent presents with no formal HEP in place. 08/21/2021- Patient reports compliance with HEP and states no questions as of today- able to perform standin using his rollator for support.    Time 6    Period Weeks    Status Achieved    Target Date 09/01/21               PT Long Term Goals - 10/13/21 1526       PT LONG TERM GOAL #1   Title Pt will improve FOTO to target score of 53  to display perceived improvements in ability to complete ADL's.    Baseline 07/21/2021= 45; 1/5=51. 10/12/2021- Just reassessed for progress note so did not reassess again- will continue with goal into new certification     Time 12    Period Weeks    Status On-going    Target Date 01/05/22      PT LONG TERM GOAL #2   Title Pt will improve BERG by at least 5 points in order to demonstrate clinically significant improvement in balance.    Baseline 07/21/2021= 39/56; 08/21/2021=46/56- Will keep goal active to ensure patient able to test consistently; 10/05/2021= 52/56    Time 12    Period Weeks    Status Achieved    Target Date 10/13/21      PT LONG TERM GOAL #3   Title Pt will decrease 5TSTS by at least 5 seconds in order to demonstrate clinically significant improvement in LE strength.    Baseline 07/21/2021= 21 sec without UE Support; 11/21= 14.0 sec without UE support-.Will Keep goal active to ensure consistency.  10/05/2021=14.75 sec without UE support  (improved from 21 sec and unchanged from last assessment of 14 sec)    Time 12    Period Weeks    Status Achieved    Target Date 10/13/21      PT LONG TERM GOAL #4   Title Pt will decrease TUG to below  18 seconds/decrease in order to demonstrate decreased fall risk.    Baseline 07/21/2021= 23 sec with 4WW; 08/21/2021= 22.13 sec with 4WW; 10/05/2021= 19.51 sec avg with 4WW    Time 12    Period Weeks    Status On-going    Target Date 01/05/22      PT LONG TERM GOAL #5   Title Pt will increase 10MWT by at least 0.23 m/s in order to demonstrate clinically significant improvement in community ambulation.    Baseline 07/21/2021= 0.37m/s; 08/21/2021= 0.57 m/s using 4WW; 10/05/2021= 0.6 m/s avg using 4WW    Time 12    Period Weeks  Status On-going    Target Date 01/05/22                   Plan - 10/31/21 0900     Clinical Impression Statement Patient presents with good motivation today for today's session. He exhibited good ability to take reciprocal steps during drills/tasks today but difficulty translating to walking with cane with any significant distance. He did respond well with coordination of walking up to cone and squat to touch or  tapping with left LE today. The pt will benefit from further skilled PT to improve balance, strength, gait and safety with ADLs    Personal Factors and Comorbidities Comorbidity 3+    Comorbidities A- Fib, Renal cancer, Addison's disease, gout, arthritis, DM    Examination-Activity Limitations Caring for Others;Carry;Dressing;Lift;Reach Overhead;Squat;Stairs;Transfers;Stand    Examination-Participation Restrictions Community Activity;Cleaning;Driving;Yard Work;Shop    Stability/Clinical Decision Making Evolving/Moderate complexity    Rehab Potential Good    PT Frequency 2x / week    PT Duration 12 weeks    PT Treatment/Interventions ADLs/Self Care Home Management;Canalith Repostioning;Cryotherapy;Electrical Stimulation;Moist Heat;Traction;Ultrasound;DME Instruction;Gait training;Stair training;Functional mobility training;Therapeutic activities;Therapeutic exercise;Balance training;Neuromuscular re-education;Patient/family education;Orthotic Fit/Training;Wheelchair mobility training;Manual techniques;Passive range of motion;Dry needling;Energy conservation;Taping;Vestibular    PT Next Visit Plan Continue with progress LE strengthening; gait training for safe mobility, Neuromuscular re-ed for improved overall balance.    PT Home Exercise Plan 08/17/2021-Access Code: [email or web address]; no updates    Consulted and Agree with Plan of Care Patient;Family member/caregiver    Family Member Consulted WIfe- [name]             Patient will benefit from skilled therapeutic intervention in order to improve the following deficits and impairments:  Abnormal gait, Decreased activity tolerance, Decreased balance, Decreased coordination, Decreased endurance, Decreased knowledge of use of DME, Decreased mobility, Decreased strength, Difficulty walking, Hypomobility, Impaired sensation, Impaired vision/preception, Postural dysfunction, Pain  Visit Diagnosis: Abnormality of gait and mobility  Difficulty in walking, not  elsewhere classified  Muscle weakness (generalized)  Unsteadiness on feet     Problem List Patient Active Problem List   Diagnosis Date Noted   Addisons disease ([place]) 03/16/2021   Low HDL (under 40) 03/16/2021   Difficulty sleeping 11/21/2020   Numbness and tingling 11/21/2020   Difficulty walking 01/14/2020   History of recent fall 01/14/2020   Sleep disorder 01/14/2020   Pancreatic mass 08/01/2018   H/O sebaceous cyst 01/06/2018   Tubular adenoma 10/28/2017   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   Edema leg 03/07/2017   Hx of atrial flutter 03/07/2017   OSA (obstructive sleep apnea) 07/24/2016   RLS (restless legs syndrome) 07/24/2016   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   Atrial fibrillation and flutter ([place]) 10/20/2014   Hyperlipidemia 06/16/2014   Adrenal crisis ([place]) 04/19/2014   Cough 03/29/2014   Weakness 03/29/2014   Cough syncope 10/15/2013   URI (upper respiratory infection) 10/15/2013   Dyslipidemia 09/04/2013   Gout 09/04/2013   Chronic kidney disease, stage III (moderate) ([place]) 11/13/2011   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   Hypothyroidism (acquired) 11/30/2010   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[place]) 11/10/2010    Lewis Moccasin, PT 10/31/2021, 9:15 AM  [place] MAIN [facility] SERVICES [address] [place], [place], [address] Phone: [phone]   Fax:  [phone]  Name: Tony Huffman MRN: [id number] Date of Birth: Jul 27, 1946

## 2021-10-31 NOTE — Progress Notes (Signed)
Patient does not recall why he made this appointment and he is no longer having any foot and ankle pain.  At this time I will go ahead and cancel this visit.

## 2021-11-03 ENCOUNTER — Other Ambulatory Visit: Payer: Self-pay

## 2021-11-03 ENCOUNTER — Ambulatory Visit: Payer: Medicare Other | Attending: Neurology

## 2021-11-03 DIAGNOSIS — R278 Other lack of coordination: Secondary | ICD-10-CM | POA: Diagnosis present

## 2021-11-03 DIAGNOSIS — R2689 Other abnormalities of gait and mobility: Secondary | ICD-10-CM | POA: Insufficient documentation

## 2021-11-03 DIAGNOSIS — R262 Difficulty in walking, not elsewhere classified: Secondary | ICD-10-CM | POA: Insufficient documentation

## 2021-11-03 DIAGNOSIS — M6281 Muscle weakness (generalized): Secondary | ICD-10-CM | POA: Insufficient documentation

## 2021-11-03 DIAGNOSIS — R2681 Unsteadiness on feet: Secondary | ICD-10-CM | POA: Insufficient documentation

## 2021-11-03 DIAGNOSIS — R269 Unspecified abnormalities of gait and mobility: Secondary | ICD-10-CM | POA: Diagnosis not present

## 2021-11-03 NOTE — Therapy (Signed)
[place] MAIN [facility] SERVICES [address] [place], [place], [address] Phone: [phone]   Fax:  [phone]  Physical Therapy Treatment  Patient Details  Name: Tony Huffman MRN: [id number] Date of Birth: 17-Apr-1946 Referring Provider (PT): Dr. Joselyn Arrow   Encounter Date: 11/03/2021   PT End of Session - 11/03/21 0800     Visit Number 28    Number of Visits 69    Date for PT Re-Evaluation 01/05/22    Authorization Type Medicare Part B    Authorization Time Period 07/21/2021- 10/13/2021; 10/13/2021-01/05/2022    Progress Note Due on Visit 30    PT Start Time 0800    PT Stop Time 0845    PT Time Calculation (min) 45 min    Equipment Utilized During Treatment Gait belt;Other (comment)    Activity Tolerance Patient tolerated treatment well    Behavior During Therapy [facility] for tasks assessed/performed             Past Medical History:  Diagnosis Date   Abnormal heart rhythm 10/20/2014   Addison's disease ([place])    Adrenal insufficiency ([place])    Allergy    Ambulates with cane    Anxiety    Arrhythmia    Cataract cortical, senile    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   Chronic kidney disease, stage 3 ([place]) 11/13/2011   Cough 03/29/2014   Edema leg 03/07/2017   Elevated cholesterol with high triglycerides 09/04/2013   Elevated red blood cell count    Erectile dysfunction    Falls frequently    Fever 03/29/2014   Generalized weakness 03/29/2014   GERD (gastroesophageal reflux disease)    Gout 09/04/2013   H/O adrenal insufficiency 05/17/2014   H/O atrial flutter 03/07/2017   H/O atrial flutter    H/O eye surgery    H/O sebaceous cyst 01/06/2018   H/O thyroid disease    H/O upper respiratory infection 10/15/2013   Hearing loss    History of back surgery    History of blepharoplasty    History of cancer 10/04/2015   History of chemotherapy    History of esophagogastroduodenoscopy (EGD)    History of prediabetes     Hyperlipidemia 06/16/2014   Hypertension    Hypertriglyceridemia    Hypothyroidism (acquired) 04/14/2014   Left leg swelling 11/23/2016   Low HDL (under 40)    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[place]) 11/10/2010   Malignant neoplasm of unspecified kidney, except renal pelvis (HCC)    OSA (obstructive sleep apnea) 07/24/2016   OSA (obstructive sleep apnea)    PAF (paroxysmal atrial fibrillation) (HCC)    PAF (paroxysmal atrial fibrillation) ([place])    Pancreatic mass 08/01/2018   Renal carcinoma ([place]) 06/16/2015   RLS (restless legs syndrome) 07/24/2016   Sleep apnea    Testicular hypofunction    Tubular adenoma 10/28/2017   Tussive syncopes 10/15/2013   Type 2 diabetes mellitus ([place])    Vitamin D deficiency disease     Past Surgical History:  Procedure Laterality Date   ADRENALECTOMY     CATARACT EXTRACTION  10/2014   CATARACT EXTRACTION EXTRACAPSULAR  11/05/2016   with Insertion Intraocular Prostheis.    COLONOSCOPY  05/06/2017   with removal lesions by snare.   COLONOSCOPY W/ BIOPSIES     05/06/2017   NEPHRECTOMY     POPLITEAL SYNOVIAL CYST EXCISION     SPINE SURGERY  1989   THYROIDECTOMY     THYROIDECTOMY, PARTIAL  TONSILLECTOMY      There were no vitals filed for this visit.   Subjective Assessment - 11/03/21 0800     Subjective Patient reports doing well without complaints today. He denies any pain or falls and states as far as he remembers his left leg has been cooperating this morning with getting ready and arriving to PT.    Patient is accompained by: Family member   wife - Tony Huffman   Pertinent History Patient is a 76 year old male with referral from Neurology- Dr. Joselyn Arrow for unspecified abnormality of gait. He presents today with his wife and reports > 6 month history of progressive issues with poor balance and multiple falls. Patient has renal Cancer and currently has Pallative care- Was stabilized out of Hospice. Patient presents with the following past medical  history: A-Fib and flutter, Lung Cancer, Obstructive sleep apnea, Upper respiratory infection, Addison's disease, Hypothyroidism, Renal Cancer, DM, gout, arthritis, Chronic kidney disease and Restless leg syndrome. Patient lives with wife in independent living at [facility]. Prior level of function was independent with transfers and mobility using walker yet some assist with dressing.    Limitations Lifting;Standing;Walking;House hold activities    How long can you sit comfortably? No issues    How long can you stand comfortably? <5 min    How long can you walk comfortably? <5 min    Diagnostic tests EXAM:  MRI HEAD WITHOUT CONTRAST     TECHNIQUE:  Multiplanar, multiecho pulse sequences of the brain and surrounding  structures were obtained without intravenous contrast.     COMPARISON:  CT head without contrast 01/20/2021     FINDINGS:  Brain: Moderate generalized atrophy is present. 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 Ventricles are enlarged  bilaterally. There is mild ventricular asymmetry, the right is  larger.     No acute infarct, hemorrhage, or mass lesion is present. No other  significant extra-axial collection is present.     The internal auditory canals are within normal limits. The brainstem  and cerebellum are within normal limits.     Vascular: Flow is present in the major intracranial arteries.     Skull and upper cervical spine: The craniocervical junction is  normal. Upper cervical spine is within normal limits. Marrow signal  is unremarkable.     Sinuses/Orbits: The paranasal sinuses and mastoid air cells are  clear. Bilateral lens replacements are noted. Globes and orbits are  otherwise unremarkable.     IMPRESSION:  1. No acute intracranial abnormality.  2. Moderate generalized atrophy and white matter disease likely  reflects the sequela of chronic microvascular ischemia.  3. Arachnoid cyst over the left convexity.    Patient Stated Goals I would like to walk better and  not fall    Currently in Pain? No/denies              INTERVENTIONS:   Therapeutic Exercises:   Warm up:  Calf raise Bilateral + high march x 12 reps each Squats- partial x 15 reps + side march (high knees/[place]) x 15 reps.    Strengthening: (in // bars with gait belt and CGA unless noted)   Walking lunges in // bars down and back x 2  Frankensteins  (Straight leg raise walking)  down and back x 2  Sit to stand with airex pad under 1 foot x 5 reps then switch to opp side for 5 more reps.   Retro gait- Open up hips/shoulders- turning  to left then right (hip ER) while walking backward x 2 trials down and back  Neuromuscular re-ed :  Obstacle course- mix of stepping on and over various objects including 6" step box, purple balance pad, green therapad, and 2 - 1/2 foam rolls.    Gait in // bars:   -walking forward holding onto cone with hedgehog on top (simulate ice cream cone) -switching hands with cone while walking forward x 4  - ladder- side stepping in // bars- holding cone with hedgehog (simulate ice cream cone) and switch hands while stepping for dual task. *mild difficulty maintaining walk as he was concentrating on secondary task of cone.  - ladder - forward/ backward -holding cone with hedgehog (simulate ice cream cone) and switch hands while stepping for dual task.   Education provided throughout session via VC/TC and demonstration to facilitate movement at target joints and correct muscle activation for all testing and exercises performed.                    PT Education - 11/03/21 0800     Education Details Exercise technique    Person(s) Educated Patient    Methods Explanation;Demonstration;Tactile cues;Verbal cues    Comprehension Tactile cues required;Verbalized understanding;Returned demonstration;Verbal cues required;Need further instruction              PT Short Term Goals - 08/21/21 7106       PT SHORT TERM GOAL #1    Title Pt will be independent with HEP in order to improve strength and balance in order to decrease fall risk and improve function at home and work.    Baseline 07/21/2021- Paitent presents with no formal HEP in place. 08/21/2021- Patient reports compliance with HEP and states no questions as of today- able to perform standin using his rollator for support.    Time 6    Period Weeks    Status Achieved    Target Date 09/01/21               PT Long Term Goals - 10/13/21 1526       PT LONG TERM GOAL #1   Title Pt will improve FOTO to target score of 53  to display perceived improvements in ability to complete ADL's.    Baseline 07/21/2021= 45; 1/5=51. 10/12/2021- Just reassessed for progress note so did not reassess again- will continue with goal into new certification    Time 12    Period Weeks    Status On-going    Target Date 01/05/22      PT LONG TERM GOAL #2   Title Pt will improve BERG by at least 5 points in order to demonstrate clinically significant improvement in balance.    Baseline 07/21/2021= 39/56; 08/21/2021=46/56- Will keep goal active to ensure patient able to test consistently; 10/05/2021= 52/56    Time 12    Period Weeks    Status Achieved    Target Date 10/13/21      PT LONG TERM GOAL #3   Title Pt will decrease 5TSTS by at least 5 seconds in order to demonstrate clinically significant improvement in LE strength.    Baseline 07/21/2021= 21 sec without UE Support; 11/21= 14.0 sec without UE support-.Will Keep goal active to ensure consistency.  10/05/2021=14.75 sec without UE support  (improved from 21 sec and unchanged from last assessment of 14 sec)    Time 12    Period Weeks    Status Achieved    Target  Date 10/13/21      PT LONG TERM GOAL #4   Title Pt will decrease TUG to below  18 seconds/decrease in order to demonstrate decreased fall risk.    Baseline 07/21/2021= 23 sec with 4WW; 08/21/2021= 22.13 sec with 4WW; 10/05/2021= 19.51 sec avg with 4WW    Time 12     Period Weeks    Status On-going    Target Date 01/05/22      PT LONG TERM GOAL #5   Title Pt will increase 10MWT by at least 0.23 m/s in order to demonstrate clinically significant improvement in community ambulation.    Baseline 07/21/2021= 0.37m/s; 08/21/2021= 0.57 m/s using 4WW; 10/05/2021= 0.6 m/s avg using 4WW    Time 12    Period Weeks    Status On-going    Target Date 01/05/22                   Plan - 11/03/21 0803     Clinical Impression Statement Patient performed well today with strengthening, balance, and dual tasks activities. He was able to move his left leg very well without significant signs of fatigue and demonstrating improving balance as seen by ability to walk/side step in bars with minimal UE support and no LOB. The pt will benefit from further skilled PT to improve balance, strength, gait and safety with ADLs    Personal Factors and Comorbidities Comorbidity 3+    Comorbidities A- Fib, Renal cancer, Addison's disease, gout, arthritis, DM    Examination-Activity Limitations Caring for Others;Carry;Dressing;Lift;Reach Overhead;Squat;Stairs;Transfers;Stand    Examination-Participation Restrictions Community Activity;Cleaning;Driving;Yard Work;Shop    Stability/Clinical Decision Making Evolving/Moderate complexity    Rehab Potential Good    PT Frequency 2x / week    PT Duration 12 weeks    PT Treatment/Interventions ADLs/Self Care Home Management;Canalith Repostioning;Cryotherapy;Electrical Stimulation;Moist Heat;Traction;Ultrasound;DME Instruction;Gait training;Stair training;Functional mobility training;Therapeutic activities;Therapeutic exercise;Balance training;Neuromuscular re-education;Patient/family education;Orthotic Fit/Training;Wheelchair mobility training;Manual techniques;Passive range of motion;Dry needling;Energy conservation;Taping;Vestibular    PT Next Visit Plan Continue with progress LE strengthening; gait training for safe mobility,  Neuromuscular re-ed for improved overall balance.    PT Home Exercise Plan 08/17/2021-Access Code: [email or web address]; no updates    Consulted and Agree with Plan of Care Patient;Family member/caregiver    Family Member Consulted WIfe- Tony Huffman             Patient will benefit from skilled therapeutic intervention in order to improve the following deficits and impairments:  Abnormal gait, Decreased activity tolerance, Decreased balance, Decreased coordination, Decreased endurance, Decreased knowledge of use of DME, Decreased mobility, Decreased strength, Difficulty walking, Hypomobility, Impaired sensation, Impaired vision/preception, Postural dysfunction, Pain  Visit Diagnosis: Abnormality of gait and mobility  Difficulty in walking, not elsewhere classified  Muscle weakness (generalized)  Unsteadiness on feet     Problem List Patient Active Problem List   Diagnosis Date Noted   Addisons disease ([place]) 03/16/2021   Low HDL (under 40) 03/16/2021   Difficulty sleeping 11/21/2020   Numbness and tingling 11/21/2020   Difficulty walking 01/14/2020   History of recent fall 01/14/2020   Sleep disorder 01/14/2020   Pancreatic mass 08/01/2018   H/O sebaceous cyst 01/06/2018   Tubular adenoma 10/28/2017   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   Edema leg 03/07/2017   Hx of atrial flutter 03/07/2017   OSA (obstructive sleep apnea) 07/24/2016   RLS (restless legs syndrome) 07/24/2016   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 [place], [place], [address] Phone: [phone]   Fax:  [phone]  Name: Tony Huffman MRN: [id number] Date of Birth: June 01, 1946

## 2021-11-06 ENCOUNTER — Other Ambulatory Visit: Payer: Self-pay

## 2021-11-06 ENCOUNTER — Other Ambulatory Visit: Payer: Medicare Other | Admitting: Student

## 2021-11-06 DIAGNOSIS — C649 Malignant neoplasm of unspecified kidney, except renal pelvis: Secondary | ICD-10-CM

## 2021-11-06 DIAGNOSIS — R2689 Other abnormalities of gait and mobility: Secondary | ICD-10-CM

## 2021-11-06 DIAGNOSIS — Z515 Encounter for palliative care: Secondary | ICD-10-CM

## 2021-11-06 NOTE — Progress Notes (Addendum)
[facility] Palliative Care Consult Note Telephone: [phone]  Fax: [phone]    Date of encounter: 11/06/21 12:04 PM PATIENT NAME: Tony Huffman [address] [id number]   [phone] (home)  DOB: 11-Jun-1946 MRN: [id number] PRIMARY CARE PROVIDER:    [name], NP,  [place] [address] [phone]  REFERRING PROVIDER:   [name], NP [place],  [place] [address] [phone]  RESPONSIBLE PARTY:    Contact Information     Name Relation Home Work [place], [place]   [phone]        I met face to face with patient  in the home. Palliative Care was asked to follow this patient by consultation request of  [name], NP to address advance care planning and complex medical decision making. This is a follow up visit.                                   ASSESSMENT AND PLAN / RECOMMENDATIONS:   Advance Care Planning/Goals of Care: Goals include to maximize quality of life and symptom management. Patient/health care surrogate gave his/her permission to discuss. Our advance care planning conversation included a discussion about:    The value and importance of advance care planning  Experiences with loved ones who have been seriously ill or have died  Exploration of personal, cultural or spiritual beliefs that might influence medical decisions  Exploration of goals of care in the event of a sudden injury or illness  CODE STATUS: DNR  Symptom Management/Plan:  Movement disorder, abnormal gait, imbalance-currently receiving therapy. He feels his gait, balance are improving. Continue therapy as directed. Use walker for ambulation; monitor for falls/safety. Follow up with Neurology as directed.    Metastatic renal carcinoma-wishes to continue on comfort path; patient previously on hospice but discharged due to stability. Education on palliative vs. Hospice.  Will refer back to hospice should patient decline.  Follow up Palliative Care Visit: Palliative care will continue to follow for complex medical decision making, advance care planning, and clarification of goals. Return in 8 weeks or prn.   This visit was coded based on medical decision making (MDM).  PPS: 50%  HOSPICE ELIGIBILITY/DIAGNOSIS: TBD  Chief Complaint: Palliative medicine follow-up visit visit.  HISTORY OF PRESENT ILLNESS:  Tony Huffman is a 76 y.o. year old male  with metastatic renal cell carcinoma, Addison's disease, polyneuropathy, T2DM, hyperlipidemia, hypertension, atrial fibrillation, vitamin D deficiency, OSA, RLS, chronic gouty arthritis.   Patient reports doing well.  He is currently receiving physical therapy 2 times a week for abnormal gait and balance.  He does endorse having 1 fall in the past 2 weeks.  He states he was leaning forward to see something on the ground and fell forward. He denies any injury stating it was a "soft fall."  He denies having any pain, shortness of breath. He does endorse being tired easily. When ambulating he does have to take rest breaks at times. He endorses a good appetite denies any weight loss. No urinary concerns; regular bowel movements reported. He is sleeping well at night. No recent medication changes. No recent infections, ED visits or hospitalizations.  Patient has upcoming appointments with endocrinology, neurology for his balance.  A 10 point review of systems is negative, except for the pertinent positives and negatives detailed in the HPI.  History obtained from review of EMR, discussion with primary team, and interview with family, facility staff/caregiver and/or Mr. Kinker.  I reviewed available labs, medications, imaging, studies and related documents from the EMR.  Records reviewed and summarized above.     Physical Exam: Pulse 76, respirations 16, blood pressure 120/72, sats 97% on room air Constitutional: NAD General:  frail appearing, WNWD EYES: anicteric sclera, lids intact, no discharge  ENMT: intact hearing, oral mucous membranes moist, dentition intact CV: S1S2, RRR, no LE edema Pulmonary: LCTA, no increased work of breathing, no cough, room air Abdomen: normo-active BS + 4 quadrants, soft and non tender, no ascites GU: deferred MSK: moves all extremities, ambulatory with rollator walker Skin: warm and dry, no rashes or wounds on visible skin Neuro: generalized weakness, alert and oriented x 3, forgetful Psych: non-anxious affect, pleasant Hem/lymph/immuno: no widespread bruising   Thank you for the opportunity to participate in the care of Mr. Frisbee.  The palliative care team will continue to follow. Please call our office at [phone] if we can be of additional assistance.   Ezekiel Slocumb, NP   COVID-19 PATIENT SCREENING TOOL Asked and negative response unless otherwise noted:   Have you had symptoms of covid, tested positive or been in contact with someone with symptoms/positive test in the past 5-10 days? No

## 2021-11-07 ENCOUNTER — Other Ambulatory Visit: Payer: Self-pay

## 2021-11-07 ENCOUNTER — Ambulatory Visit: Payer: Medicare Other

## 2021-11-07 DIAGNOSIS — M6281 Muscle weakness (generalized): Secondary | ICD-10-CM

## 2021-11-07 DIAGNOSIS — R269 Unspecified abnormalities of gait and mobility: Secondary | ICD-10-CM | POA: Diagnosis not present

## 2021-11-07 DIAGNOSIS — R2681 Unsteadiness on feet: Secondary | ICD-10-CM

## 2021-11-07 DIAGNOSIS — R262 Difficulty in walking, not elsewhere classified: Secondary | ICD-10-CM

## 2021-11-07 NOTE — Therapy (Signed)
[place] MAIN [facility] SERVICES [address] [place], [place], [address] Phone: [phone]   Fax:  [phone]  Physical Therapy Treatment  Patient Details  Name: Tony Huffman MRN: [id number] Date of Birth: 1946/06/12 Referring Provider (PT): Dr. Joselyn Arrow   Encounter Date: 11/07/2021   PT End of Session - 11/07/21 0810     Visit Number 29    Number of Visits 28    Date for PT Re-Evaluation 01/05/22    Authorization Type Medicare Part B    Authorization Time Period 07/21/2021- 10/13/2021; 10/13/2021-01/05/2022    Progress Note Due on Visit 30    PT Start Time 0804    PT Stop Time 0844    PT Time Calculation (min) 40 min    Equipment Utilized During Treatment Gait belt;Other (comment)    Activity Tolerance Patient tolerated treatment well    Behavior During Therapy [facility] for tasks assessed/performed             Past Medical History:  Diagnosis Date   Abnormal heart rhythm 10/20/2014   Addison's disease ([place])    Adrenal insufficiency ([place])    Allergy    Ambulates with cane    Anxiety    Arrhythmia    Cataract cortical, senile    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   Chronic kidney disease, stage 3 ([place]) 11/13/2011   Cough 03/29/2014   Edema leg 03/07/2017   Elevated cholesterol with high triglycerides 09/04/2013   Elevated red blood cell count    Erectile dysfunction    Falls frequently    Fever 03/29/2014   Generalized weakness 03/29/2014   GERD (gastroesophageal reflux disease)    Gout 09/04/2013   H/O adrenal insufficiency 05/17/2014   H/O atrial flutter 03/07/2017   H/O atrial flutter    H/O eye surgery    H/O sebaceous cyst 01/06/2018   H/O thyroid disease    H/O upper respiratory infection 10/15/2013   Hearing loss    History of back surgery    History of blepharoplasty    History of cancer 10/04/2015   History of chemotherapy    History of esophagogastroduodenoscopy (EGD)    History of prediabetes     Hyperlipidemia 06/16/2014   Hypertension    Hypertriglyceridemia    Hypothyroidism (acquired) 04/14/2014   Left leg swelling 11/23/2016   Low HDL (under 40)    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[place]) 11/10/2010   Malignant neoplasm of unspecified kidney, except renal pelvis (HCC)    OSA (obstructive sleep apnea) 07/24/2016   OSA (obstructive sleep apnea)    PAF (paroxysmal atrial fibrillation) (HCC)    PAF (paroxysmal atrial fibrillation) ([place])    Pancreatic mass 08/01/2018   Renal carcinoma ([place]) 06/16/2015   RLS (restless legs syndrome) 07/24/2016   Sleep apnea    Testicular hypofunction    Tubular adenoma 10/28/2017   Tussive syncopes 10/15/2013   Type 2 diabetes mellitus ([place])    Vitamin D deficiency disease     Past Surgical History:  Procedure Laterality Date   ADRENALECTOMY     CATARACT EXTRACTION  10/2014   CATARACT EXTRACTION EXTRACAPSULAR  11/05/2016   with Insertion Intraocular Prostheis.    COLONOSCOPY  05/06/2017   with removal lesions by snare.   COLONOSCOPY W/ BIOPSIES     05/06/2017   NEPHRECTOMY     POPLITEAL SYNOVIAL CYST EXCISION     SPINE SURGERY  1989   THYROIDECTOMY     THYROIDECTOMY, PARTIAL  TONSILLECTOMY      There were no vitals filed for this visit.   Subjective Assessment - 11/07/21 0808     Subjective Patient reports having some more right shoulder soreness- he attributes to using his cane more.    Patient is accompained by: Family member   [name]   Pertinent History Patient is a 76 year old male with referral from Neurology- Dr. Joselyn Arrow for unspecified abnormality of gait. He presents today with his wife and reports > 6 month history of progressive issues with poor balance and multiple falls. Patient has renal Cancer and currently has Pallative care- Was stabilized out of Hospice. Patient presents with the following past medical history: A-Fib and flutter, Lung Cancer, Obstructive sleep apnea, Upper respiratory infection, Addison's  disease, Hypothyroidism, Renal Cancer, DM, gout, arthritis, Chronic kidney disease and Restless leg syndrome. Patient lives with wife in independent living at [facility]. Prior level of function was independent with transfers and mobility using walker yet some assist with dressing.    Limitations Lifting;Standing;Walking;House hold activities    How long can you sit comfortably? No issues    How long can you stand comfortably? <5 min    How long can you walk comfortably? <5 min    Diagnostic tests EXAM:  MRI HEAD WITHOUT CONTRAST     TECHNIQUE:  Multiplanar, multiecho pulse sequences of the brain and surrounding  structures were obtained without intravenous contrast.     COMPARISON:  CT head without contrast 01/20/2021     FINDINGS:  Brain: Moderate generalized atrophy is present. 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 Ventricles are enlarged  bilaterally. There is mild ventricular asymmetry, the right is  larger.     No acute infarct, hemorrhage, or mass lesion is present. No other  significant extra-axial collection is present.     The internal auditory canals are within normal limits. The brainstem  and cerebellum are within normal limits.     Vascular: Flow is present in the major intracranial arteries.     Skull and upper cervical spine: The craniocervical junction is  normal. Upper cervical spine is within normal limits. Marrow signal  is unremarkable.     Sinuses/Orbits: The paranasal sinuses and mastoid air cells are  clear. Bilateral lens replacements are noted. Globes and orbits are  otherwise unremarkable.     IMPRESSION:  1. No acute intracranial abnormality.  2. Moderate generalized atrophy and white matter disease likely  reflects the sequela of chronic microvascular ischemia.  3. Arachnoid cyst over the left convexity.    Patient Stated Goals I would like to walk better and not fall    Currently in Pain? Yes    Pain Score 2     Pain Location Shoulder    Pain Orientation Right     Pain Descriptors / Indicators Aching    Pain Type Acute pain    Pain Onset In the past 7 days    Pain Frequency Intermittent    Aggravating Factors  Increased walking- Use of cane    Effect of Pain on Daily Activities Difficulty with walking               INTERVENTIONS:   Therex:   Seated ham curls- BLE using blue TB 2sets of 12 reps  Sit to stand with airex pad under left foot  to bias power from opp leg (no UE support) x 10 reps then switch to opp side for 10 more  reps (min UE support) with pad under right foot due to weakness.      Neuromuscular  re-ed:   Static stand at support bar performing activities using dry erase board for dual task activities.  - while standing at support bar with back leg on airex pad and front leg on 6" block (staggered stance)- Patient performed placing letters on magnet board  from a scrambled order jumbled up into alphabetical order- 13 letters then last 13 after switching LE's - No UE support - Mild lateral sway with occasional LOB requiring UE support.   - 2 games of hang man- Same stance as above for 1st game and then near tandem on airex pad for second game.  More unsteadiness observed with near tandem with both feet on airex pad with increased touching of support bar.   10 MWT= using cane = 0.51 m/s    Education provided throughout session via VC/TC and demonstration to facilitate movement at target joints and correct muscle activation for all testing and exercises performed.                       PT Education - 11/07/21 1451     Education Details Exercise technique    Person(s) Educated Patient    Methods Explanation    Comprehension Verbalized understanding;Returned demonstration;Tactile cues required;Need further instruction;Verbal cues required              PT Short Term Goals - 08/21/21 0814       PT SHORT TERM GOAL #1   Title Pt will be independent with HEP in order to improve strength and  balance in order to decrease fall risk and improve function at home and work.    Baseline 07/21/2021- Paitent presents with no formal HEP in place. 08/21/2021- Patient reports compliance with HEP and states no questions as of today- able to perform standin using his rollator for support.    Time 6    Period Weeks    Status Achieved    Target Date 09/01/21               PT Long Term Goals - 10/13/21 1526       PT LONG TERM GOAL #1   Title Pt will improve FOTO to target score of 53  to display perceived improvements in ability to complete ADL's.    Baseline 07/21/2021= 45; 1/5=51. 10/12/2021- Just reassessed for progress note so did not reassess again- will continue with goal into new certification    Time 12    Period Weeks    Status On-going    Target Date 01/05/22      PT LONG TERM GOAL #2   Title Pt will improve BERG by at least 5 points in order to demonstrate clinically significant improvement in balance.    Baseline 07/21/2021= 39/56; 08/21/2021=46/56- Will keep goal active to ensure patient able to test consistently; 10/05/2021= 52/56    Time 12    Period Weeks    Status Achieved    Target Date 10/13/21      PT LONG TERM GOAL #3   Title Pt will decrease 5TSTS by at least 5 seconds in order to demonstrate clinically significant improvement in LE strength.    Baseline 07/21/2021= 21 sec without UE Support; 11/21= 14.0 sec without UE support-.Will Keep goal active to ensure consistency.  10/05/2021=14.75 sec without UE support  (improved from 21 sec and unchanged from last assessment of 14 sec)  Time 12    Period Weeks    Status Achieved    Target Date 10/13/21      PT LONG TERM GOAL #4   Title Pt will decrease TUG to below  18 seconds/decrease in order to demonstrate decreased fall risk.    Baseline 07/21/2021= 23 sec with 4WW; 08/21/2021= 22.13 sec with 4WW; 10/05/2021= 19.51 sec avg with 4WW    Time 12    Period Weeks    Status On-going    Target Date 01/05/22       PT LONG TERM GOAL #5   Title Pt will increase 10MWT by at least 0.23 m/s in order to demonstrate clinically significant improvement in community ambulation.    Baseline 07/21/2021= 0.31m/s; 08/21/2021= 0.57 m/s using 4WW; 10/05/2021= 0.6 m/s avg using 4WW    Time 12    Period Weeks    Status On-going    Target Date 01/05/22                   Plan - 11/07/21 0811     Clinical Impression Statement Patient arrived with excellent motivation today and performed well with LE strengthening- exhibiting some fatigue with left LE. He performed well with dual task balance activities- intermittent LOB reaching for Support bar did improve with min VC- he was fatigued with balance at end of session. Overall progressing with mobility as he is now able to test his 10 MWT using a cane verus a walker. The pt will benefit from further skilled PT to improve balance, strength, gait and safety with ADLs    Personal Factors and Comorbidities Comorbidity 3+    Comorbidities A- Fib, Renal cancer, Addison's disease, gout, arthritis, DM    Examination-Activity Limitations Caring for Others;Carry;Dressing;Lift;Reach Overhead;Squat;Stairs;Transfers;Stand    Examination-Participation Restrictions Community Activity;Cleaning;Driving;Yard Work;Shop    Stability/Clinical Decision Making Evolving/Moderate complexity    Rehab Potential Good    PT Frequency 2x / week    PT Duration 12 weeks    PT Treatment/Interventions ADLs/Self Care Home Management;Canalith Repostioning;Cryotherapy;Electrical Stimulation;Moist Heat;Traction;Ultrasound;DME Instruction;Gait training;Stair training;Functional mobility training;Therapeutic activities;Therapeutic exercise;Balance training;Neuromuscular re-education;Patient/family education;Orthotic Fit/Training;Wheelchair mobility training;Manual techniques;Passive range of motion;Dry needling;Energy conservation;Taping;Vestibular    PT Next Visit Plan Continue with progress LE strengthening;  gait training for safe mobility, Neuromuscular re-ed for improved overall balance.    PT Home Exercise Plan 08/17/2021-Access Code: [email or web address]; no updates    Consulted and Agree with Plan of Care Patient;Family member/caregiver    Family Member Consulted WIfe- [name]             Patient will benefit from skilled therapeutic intervention in order to improve the following deficits and impairments:  Abnormal gait, Decreased activity tolerance, Decreased balance, Decreased coordination, Decreased endurance, Decreased knowledge of use of DME, Decreased mobility, Decreased strength, Difficulty walking, Hypomobility, Impaired sensation, Impaired vision/preception, Postural dysfunction, Pain  Visit Diagnosis: Abnormality of gait and mobility  Difficulty in walking, not elsewhere classified  Muscle weakness (generalized)  Unsteadiness on feet     Problem List Patient Active Problem List   Diagnosis Date Noted   Addisons disease ([place]) 03/16/2021   Low HDL (under 40) 03/16/2021   Difficulty sleeping 11/21/2020   Numbness and tingling 11/21/2020   Difficulty walking 01/14/2020   History of recent fall 01/14/2020   Sleep disorder 01/14/2020   Pancreatic mass 08/01/2018   H/O sebaceous cyst 01/06/2018   Tubular adenoma 10/28/2017   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   Edema leg 03/07/2017   Hx of atrial flutter 03/07/2017  OSA (obstructive sleep apnea) 07/24/2016   RLS (restless legs syndrome) 07/24/2016   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 [place], [place], [address] Phone: [phone]   Fax:  [phone]  Name: Sayid Moll MRN: [id number] Date of Birth: Aug 12, 1946

## 2021-11-10 ENCOUNTER — Other Ambulatory Visit: Payer: Self-pay

## 2021-11-10 ENCOUNTER — Ambulatory Visit: Payer: Medicare Other

## 2021-11-10 DIAGNOSIS — R2681 Unsteadiness on feet: Secondary | ICD-10-CM

## 2021-11-10 DIAGNOSIS — M6281 Muscle weakness (generalized): Secondary | ICD-10-CM

## 2021-11-10 DIAGNOSIS — R269 Unspecified abnormalities of gait and mobility: Secondary | ICD-10-CM | POA: Diagnosis not present

## 2021-11-10 NOTE — Therapy (Signed)
[place] MAIN [facility] SERVICES [address] [place], [place], [address] Phone: [phone]   Fax:  [phone]  Physical Therapy Treatment /Physical Therapy Progress Note   Dates of reporting period  10/05/21   to   11/10/21   Patient Details  Name: Tony Huffman MRN: [id number] Date of Birth: 1946-01-19 Referring Provider (PT): Dr. Joselyn Arrow   Encounter Date: 11/10/2021   PT End of Session - 11/10/21 0840     Visit Number 30    Number of Visits 75    Date for PT Re-Evaluation 01/05/22    Authorization Type Medicare Part B; next session 1/10 PN 11/10/21    Authorization Time Period 07/21/2021- 10/13/2021; 10/13/2021-01/05/2022    Progress Note Due on Visit 30    PT Start Time 0845    PT Stop Time 0929    PT Time Calculation (min) 44 min    Equipment Utilized During Treatment Gait belt;Other (comment)    Activity Tolerance Patient tolerated treatment well    Behavior During Therapy [facility] for tasks assessed/performed             Past Medical History:  Diagnosis Date   Abnormal heart rhythm 10/20/2014   Addison's disease ([place])    Adrenal insufficiency ([place])    Allergy    Ambulates with cane    Anxiety    Arrhythmia    Cataract cortical, senile    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   Chronic kidney disease, stage 3 ([place]) 11/13/2011   Cough 03/29/2014   Edema leg 03/07/2017   Elevated cholesterol with high triglycerides 09/04/2013   Elevated red blood cell count    Erectile dysfunction    Falls frequently    Fever 03/29/2014   Generalized weakness 03/29/2014   GERD (gastroesophageal reflux disease)    Gout 09/04/2013   H/O adrenal insufficiency 05/17/2014   H/O atrial flutter 03/07/2017   H/O atrial flutter    H/O eye surgery    H/O sebaceous cyst 01/06/2018   H/O thyroid disease    H/O upper respiratory infection 10/15/2013   Hearing loss    History of back surgery    History of blepharoplasty    History of cancer 10/04/2015    History of chemotherapy    History of esophagogastroduodenoscopy (EGD)    History of prediabetes    Hyperlipidemia 06/16/2014   Hypertension    Hypertriglyceridemia    Hypothyroidism (acquired) 04/14/2014   Left leg swelling 11/23/2016   Low HDL (under 40)    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[place]) 11/10/2010   Malignant neoplasm of unspecified kidney, except renal pelvis (HCC)    OSA (obstructive sleep apnea) 07/24/2016   OSA (obstructive sleep apnea)    PAF (paroxysmal atrial fibrillation) (HCC)    PAF (paroxysmal atrial fibrillation) ([place])    Pancreatic mass 08/01/2018   Renal carcinoma ([place]) 06/16/2015   RLS (restless legs syndrome) 07/24/2016   Sleep apnea    Testicular hypofunction    Tubular adenoma 10/28/2017   Tussive syncopes 10/15/2013   Type 2 diabetes mellitus ([place])    Vitamin D deficiency disease     Past Surgical History:  Procedure Laterality Date   ADRENALECTOMY     CATARACT EXTRACTION  10/2014   CATARACT EXTRACTION EXTRACAPSULAR  11/05/2016   with Insertion Intraocular Prostheis.    COLONOSCOPY  05/06/2017   with removal lesions by snare.   COLONOSCOPY W/ BIOPSIES     05/06/2017   NEPHRECTOMY     POPLITEAL  SYNOVIAL CYST EXCISION     SPINE SURGERY  1989   THYROIDECTOMY     THYROIDECTOMY, PARTIAL     TONSILLECTOMY      There were no vitals filed for this visit.   Subjective Assessment - 11/10/21 1034     Subjective Patient reports no falls or LOB, has some soreness in his arm from using his cane. Is feeling more steady.    Patient is accompained by: Family member   wife - Tony Huffman   Pertinent History Patient is a 75 year old male with referral from Neurology- Dr. Joselyn Arrow for unspecified abnormality of gait. He presents today with his wife and reports > 6 month history of progressive issues with poor balance and multiple falls. Patient has renal Cancer and currently has Pallative care- Was stabilized out of Hospice. Patient presents with the following  past medical history: A-Fib and flutter, Lung Cancer, Obstructive sleep apnea, Upper respiratory infection, Addison's disease, Hypothyroidism, Renal Cancer, DM, gout, arthritis, Chronic kidney disease and Restless leg syndrome. Patient lives with wife in independent living at [facility]. Prior level of function was independent with transfers and mobility using walker yet some assist with dressing.    Limitations Lifting;Standing;Walking;House hold activities    How long can you sit comfortably? No issues    How long can you stand comfortably? <5 min    How long can you walk comfortably? <5 min    Diagnostic tests EXAM:  MRI HEAD WITHOUT CONTRAST     TECHNIQUE:  Multiplanar, multiecho pulse sequences of the brain and surrounding  structures were obtained without intravenous contrast.     COMPARISON:  CT head without contrast 01/20/2021     FINDINGS:  Brain: Moderate generalized atrophy is present. 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 Ventricles are enlarged  bilaterally. There is mild ventricular asymmetry, the right is  larger.     No acute infarct, hemorrhage, or mass lesion is present. No other  significant extra-axial collection is present.     The internal auditory canals are within normal limits. The brainstem  and cerebellum are within normal limits.     Vascular: Flow is present in the major intracranial arteries.     Skull and upper cervical spine: The craniocervical junction is  normal. Upper cervical spine is within normal limits. Marrow signal  is unremarkable.     Sinuses/Orbits: The paranasal sinuses and mastoid air cells are  clear. Bilateral lens replacements are noted. Globes and orbits are  otherwise unremarkable.     IMPRESSION:  1. No acute intracranial abnormality.  2. Moderate generalized atrophy and white matter disease likely  reflects the sequela of chronic microvascular ischemia.  3. Arachnoid cyst over the left convexity.    Patient Stated Goals I would like to walk  better and not fall    Currently in Pain? Yes    Pain Score 1     Pain Location Shoulder    Pain Orientation Right    Pain Descriptors / Indicators Aching    Pain Type Acute pain    Pain Onset 1 to 4 weeks ago    Pain Frequency Intermittent                  Patient reports his shoulder is aching, a 1/10   Goals:   10 MWT last session was 0.51 m/s with cane  FOTO: 49%  TUG:15.4 seconds with cane.     INTERVENTIONS:    Therex:  Seated ham curls- BLE using blue TB 2sets of 12 reps   Sit to stand with 6" step under RLE 10x; under LLE 10x      Neuromuscular  re-ed:    Orange hurdle: -Forward step over and back 10x each LE; very challenging LLE; SUE support -Lateral step over and back 10x each LE; BUE support  Sit on dynadisc: arms crossed marching 12x each LE   Static stand at support bar performing activities using dry erase board for dual task activities.    - 2 games of hang man- airex pad stance with cues for decreasing UE support More unsteadiness observed with near tandem with both feet on airex pad with increased touching of support bar. Reaching inside/outside BOS for letters. Occasional cueing required   Pt educated throughout session about proper posture and technique with exercises. Improved exercise technique, movement at target joints, use of target muscles after min to mod verbal, visual, tactile cues.     Patient's condition has the potential to improve in response to therapy. Maximum improvement is yet to be obtained. The anticipated improvement is attainable and reasonable in a generally predictable time.  Patient reports he is improving, his balance is improving with decreased falls. He is able to use a cane now.   Patient has met his TUG goal with a SPC performing it in 15.4 seconds. He has transitioned from utilizing a 4ww to a more independent AD of the cane. Patient demonstrates excellent motivation throughout physical therapy session.  Intermittent LLE fatigue noted throughout session with occasional rest breaks required. Continuation of weight shift training will benefit patient with patient tolerating sit to stands with 6" step well. Patient's condition has the potential to improve in response to therapy. Maximum improvement is yet to be obtained. The anticipated improvement is attainable and reasonable in a generally predictable time. The pt will benefit from further skilled PT to improve balance, strength, gait and safety with ADLs                 PT Education - 11/10/21 0839     Education Details goals, progress note, body mechanics    Person(s) Educated Patient    Methods Explanation;Demonstration;Tactile cues;Verbal cues    Comprehension Verbalized understanding;Returned demonstration;Verbal cues required;Tactile cues required              PT Short Term Goals - 08/21/21 0814       PT SHORT TERM GOAL #1   Title Pt will be independent with HEP in order to improve strength and balance in order to decrease fall risk and improve function at home and work.    Baseline 07/21/2021- Paitent presents with no formal HEP in place. 08/21/2021- Patient reports compliance with HEP and states no questions as of today- able to perform standin using his rollator for support.    Time 6    Period Weeks    Status Achieved    Target Date 09/01/21               PT Long Term Goals - 11/10/21 1035       PT LONG TERM GOAL #1   Title Pt will improve FOTO to target score of 53  to display perceived improvements in ability to complete ADL's.    Baseline 07/21/2021= 45; 1/5=51. 10/12/2021- Just reassessed for progress note so did not reassess again- will continue with goal into new certification 4/26: 83%    Time 12    Period Weeks  Status On-going    Target Date 01/05/22      PT LONG TERM GOAL #2   Title Pt will improve BERG by at least 5 points in order to demonstrate clinically significant improvement in  balance.    Baseline 07/21/2021= 39/56; 08/21/2021=46/56- Will keep goal active to ensure patient able to test consistently; 10/05/2021= 52/56    Time 12    Period Weeks    Status Achieved    Target Date 10/13/21      PT LONG TERM GOAL #3   Title Pt will decrease 5TSTS by at least 5 seconds in order to demonstrate clinically significant improvement in LE strength.    Baseline 07/21/2021= 21 sec without UE Support; 11/21= 14.0 sec without UE support-.Will Keep goal active to ensure consistency.  10/05/2021=14.75 sec without UE support  (improved from 21 sec and unchanged from last assessment of 14 sec)    Time 12    Period Weeks    Status Achieved    Target Date 10/13/21      PT LONG TERM GOAL #4   Title Pt will decrease TUG to below  18 seconds/decrease in order to demonstrate decreased fall risk.    Baseline 07/21/2021= 23 sec with 4WW; 08/21/2021= 22.13 sec with 4WW; 10/05/2021= 19.51 sec avg with 4WW 2/10: 15.4 seconds with cane    Time 12    Period Weeks    Status Achieved    Target Date 01/05/22      PT LONG TERM GOAL #5   Title Pt will increase 10MWT by at least 0.23 m/s in order to demonstrate clinically significant improvement in community ambulation.    Baseline 07/21/2021= 0.34m/s; 08/21/2021= 0.57 m/s using 4WW; 10/05/2021= 0.6 m/s avg using 4WW 2/10: 0.51 m/s with cane    Time 12    Period Weeks    Status Partially Met    Target Date 01/05/22                   Plan - 11/10/21 1039     Clinical Impression Statement Patient has met his TUG goal with a SPC performing it in 15.4 seconds. He has transitioned from utilizing a 4ww to a more independent AD of the cane. Patient demonstrates excellent motivation throughout physical therapy session. Intermittent LLE fatigue noted throughout session with occasional rest breaks required. Continuation of weight shift training will benefit patient with patient tolerating sit to stands with 6" step well. ?Patient's condition has the  potential to improve in response to therapy. Maximum improvement is yet to be obtained. The anticipated improvement is attainable and reasonable in a generally predictable time. The pt will benefit from further skilled PT to improve balance, strength, gait and safety with ADLs    Personal Factors and Comorbidities Comorbidity 3+    Comorbidities A- Fib, Renal cancer, Addison's disease, gout, arthritis, DM    Examination-Activity Limitations Caring for Others;Carry;Dressing;Lift;Reach Overhead;Squat;Stairs;Transfers;Stand    Examination-Participation Restrictions Community Activity;Cleaning;Driving;Yard Work;Shop    Stability/Clinical Decision Making Evolving/Moderate complexity    Rehab Potential Good    PT Frequency 2x / week    PT Duration 12 weeks    PT Treatment/Interventions ADLs/Self Care Home Management;Canalith Repostioning;Cryotherapy;Electrical Stimulation;Moist Heat;Traction;Ultrasound;DME Instruction;Gait training;Stair training;Functional mobility training;Therapeutic activities;Therapeutic exercise;Balance training;Neuromuscular re-education;Patient/family education;Orthotic Fit/Training;Wheelchair mobility training;Manual techniques;Passive range of motion;Dry needling;Energy conservation;Taping;Vestibular    PT Next Visit Plan Continue with progress LE strengthening; gait training for safe mobility, Neuromuscular re-ed for improved overall balance.    PT Home Exercise Plan 08/17/2021-Access Code:  [email or web address]; no updates    Consulted and Agree with Plan of Care Patient;Family member/caregiver    Family Member Consulted WIfe- Tony Huffman             Patient will benefit from skilled therapeutic intervention in order to improve the following deficits and impairments:  Abnormal gait, Decreased activity tolerance, Decreased balance, Decreased coordination, Decreased endurance, Decreased knowledge of use of DME, Decreased mobility, Decreased strength, Difficulty walking, Hypomobility, Impaired  sensation, Impaired vision/preception, Postural dysfunction, Pain  Visit Diagnosis: Abnormality of gait and mobility  Muscle weakness (generalized)  Unsteadiness on feet     Problem List Patient Active Problem List   Diagnosis Date Noted   Addisons disease ([place]) 03/16/2021   Low HDL (under 40) 03/16/2021   Difficulty sleeping 11/21/2020   Numbness and tingling 11/21/2020   Difficulty walking 01/14/2020   History of recent fall 01/14/2020   Sleep disorder 01/14/2020   Pancreatic mass 08/01/2018   H/O sebaceous cyst 01/06/2018   Tubular adenoma 10/28/2017   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   Edema leg 03/07/2017   Hx of atrial flutter 03/07/2017   OSA (obstructive sleep apnea) 07/24/2016   RLS (restless legs syndrome) 07/24/2016   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   Atrial fibrillation and flutter ([place]) 10/20/2014   Hyperlipidemia 06/16/2014   Adrenal crisis ([place]) 04/19/2014   Cough 03/29/2014   Weakness 03/29/2014   Cough syncope 10/15/2013   URI (upper respiratory infection) 10/15/2013   Dyslipidemia 09/04/2013   Gout 09/04/2013   Chronic kidney disease, stage III (moderate) ([place]) 11/13/2011   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   Hypothyroidism (acquired) 11/30/2010   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[place]) 11/10/2010    Janna Arch, PT, DPT  11/10/2021, 10:40 AM  [place] MAIN [facility] SERVICES [address] [place], [place], [address] Phone: [phone]   Fax:  [phone]  Name: Tony Huffman MRN: [id number] Date of Birth: 1946/09/19

## 2021-11-14 ENCOUNTER — Other Ambulatory Visit: Payer: Self-pay

## 2021-11-14 ENCOUNTER — Ambulatory Visit: Payer: Medicare Other

## 2021-11-14 DIAGNOSIS — R269 Unspecified abnormalities of gait and mobility: Secondary | ICD-10-CM | POA: Diagnosis not present

## 2021-11-14 DIAGNOSIS — M6281 Muscle weakness (generalized): Secondary | ICD-10-CM

## 2021-11-14 DIAGNOSIS — R2681 Unsteadiness on feet: Secondary | ICD-10-CM

## 2021-11-14 DIAGNOSIS — R262 Difficulty in walking, not elsewhere classified: Secondary | ICD-10-CM

## 2021-11-14 NOTE — Therapy (Signed)
[place] MAIN [facility] SERVICES [address] [place], [place], [address] Phone: [phone]   Fax:  [phone]  Physical Therapy Treatment  Patient Details  Name: Tony Huffman MRN: [id number] Date of Birth: March 27, 1946 Referring Provider (PT): Dr. Joselyn Arrow   Encounter Date: 11/14/2021   PT End of Session - 11/14/21 0758     Visit Number 31    Number of Visits 59    Date for PT Re-Evaluation 01/05/22    Authorization Type Medicare Part B; next session 1/10 PN 11/10/21    Authorization Time Period 07/21/2021- 10/13/2021; 10/13/2021-01/05/2022    Progress Note Due on Visit 30    PT Start Time 0800    PT Stop Time 0844    PT Time Calculation (min) 44 min    Equipment Utilized During Treatment Gait belt;Other (comment)    Activity Tolerance Patient tolerated treatment well    Behavior During Therapy [facility] for tasks assessed/performed             Past Medical History:  Diagnosis Date   Abnormal heart rhythm 10/20/2014   Addison's disease ([place])    Adrenal insufficiency ([name])    Allergy    Ambulates with cane    Anxiety    Arrhythmia    Cataract cortical, senile    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   Chronic kidney disease, stage 3 ([place]) 11/13/2011   Cough 03/29/2014   Edema leg 03/07/2017   Elevated cholesterol with high triglycerides 09/04/2013   Elevated red blood cell count    Erectile dysfunction    Falls frequently    Fever 03/29/2014   Generalized weakness 03/29/2014   GERD (gastroesophageal reflux disease)    Gout 09/04/2013   H/O adrenal insufficiency 05/17/2014   H/O atrial flutter 03/07/2017   H/O atrial flutter    H/O eye surgery    H/O sebaceous cyst 01/06/2018   H/O thyroid disease    H/O upper respiratory infection 10/15/2013   Hearing loss    History of back surgery    History of blepharoplasty    History of cancer 10/04/2015   History of chemotherapy    History of esophagogastroduodenoscopy (EGD)    History  of prediabetes    Hyperlipidemia 06/16/2014   Hypertension    Hypertriglyceridemia    Hypothyroidism (acquired) 04/14/2014   Left leg swelling 11/23/2016   Low HDL (under 40)    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[place]) 11/10/2010   Malignant neoplasm of unspecified kidney, except renal pelvis (HCC)    OSA (obstructive sleep apnea) 07/24/2016   OSA (obstructive sleep apnea)    PAF (paroxysmal atrial fibrillation) (HCC)    PAF (paroxysmal atrial fibrillation) ([place])    Pancreatic mass 08/01/2018   Renal carcinoma ([place]) 06/16/2015   RLS (restless legs syndrome) 07/24/2016   Sleep apnea    Testicular hypofunction    Tubular adenoma 10/28/2017   Tussive syncopes 10/15/2013   Type 2 diabetes mellitus ([place])    Vitamin D deficiency disease     Past Surgical History:  Procedure Laterality Date   ADRENALECTOMY     CATARACT EXTRACTION  10/2014   CATARACT EXTRACTION EXTRACAPSULAR  11/05/2016   with Insertion Intraocular Prostheis.    COLONOSCOPY  05/06/2017   with removal lesions by snare.   COLONOSCOPY W/ BIOPSIES     05/06/2017   NEPHRECTOMY     POPLITEAL SYNOVIAL CYST EXCISION     SPINE SURGERY  1989   THYROIDECTOMY     THYROIDECTOMY,  PARTIAL     TONSILLECTOMY      There were no vitals filed for this visit.   Subjective Assessment - 11/14/21 0812     Subjective Patient reports doing okay with no new complaints today.    Patient is accompained by: Family member   wife - Tony Huffman   Pertinent History Patient is a 76 year old male with referral from Neurology- Dr. Joselyn Arrow for unspecified abnormality of gait. He presents today with his wife and reports > 6 month history of progressive issues with poor balance and multiple falls. Patient has renal Cancer and currently has Pallative care- Was stabilized out of Hospice. Patient presents with the following past medical history: A-Fib and flutter, Lung Cancer, Obstructive sleep apnea, Upper respiratory infection, Addison's disease,  Hypothyroidism, Renal Cancer, DM, gout, arthritis, Chronic kidney disease and Restless leg syndrome. Patient lives with wife in independent living at [facility]. Prior level of function was independent with transfers and mobility using walker yet some assist with dressing.    Limitations Lifting;Standing;Walking;House hold activities    How long can you sit comfortably? No issues    How long can you stand comfortably? <5 min    How long can you walk comfortably? <5 min    Diagnostic tests EXAM:  MRI HEAD WITHOUT CONTRAST     TECHNIQUE:  Multiplanar, multiecho pulse sequences of the brain and surrounding  structures were obtained without intravenous contrast.     COMPARISON:  CT head without contrast 01/20/2021     FINDINGS:  Brain: Moderate generalized atrophy is present. 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 Ventricles are enlarged  bilaterally. There is mild ventricular asymmetry, the right is  larger.     No acute infarct, hemorrhage, or mass lesion is present. No other  significant extra-axial collection is present.     The internal auditory canals are within normal limits. The brainstem  and cerebellum are within normal limits.     Vascular: Flow is present in the major intracranial arteries.     Skull and upper cervical spine: The craniocervical junction is  normal. Upper cervical spine is within normal limits. Marrow signal  is unremarkable.     Sinuses/Orbits: The paranasal sinuses and mastoid air cells are  clear. Bilateral lens replacements are noted. Globes and orbits are  otherwise unremarkable.     IMPRESSION:  1. No acute intracranial abnormality.  2. Moderate generalized atrophy and white matter disease likely  reflects the sequela of chronic microvascular ischemia.  3. Arachnoid cyst over the left convexity.    Patient Stated Goals I would like to walk better and not fall    Pain Onset 1 to 4 weeks ago           Warm up:   Therex:  Seated hip march 4 lb Alt. LE x 12  reps LAQ Alt LE x 12 reps Standing- Step ups 4lb. Alt x 12 reps (mild difficulty with lifting left LE today)    Neruromuscular re-ed:    Rockerboard: A/P- attempting to stand flat on rocker without going - Static stand x 20 sec x 4- Mild deviation noted primarily to left side or posterior- LOB requiring UE support.   Lateral rocker board- static standing initially then progressed to Dynamic weight shifting- List to left more with static standing and able dynamic weight shift slowly today.     Gait - Ambulation approx 170 feet - using a cane- Performing with reciprocal steps the  1st 60 feet or so but then started dragging left foot- unable to respond to just VC - had him stop and regroup 2 x during remainder of walk.   Step over 1/2 foam- forward x 10 reps- Patient was struggling to pick his left foot up and over today- Attempted a second set- but similar results - stopped after 4 reps.   Education provided throughout session via VC/TC and demonstration to facilitate movement at target joints and correct muscle activation for all testing and exercises performed.                            PT Education - 11/14/21 1248     Education Details exercise technique    Person(s) Educated Patient    Methods Explanation;Demonstration;Tactile cues;Verbal cues    Comprehension Verbalized understanding;Returned demonstration;Verbal cues required;Tactile cues required;Need further instruction              PT Short Term Goals - 08/21/21 0814       PT SHORT TERM GOAL #1   Title Pt will be independent with HEP in order to improve strength and balance in order to decrease fall risk and improve function at home and work.    Baseline 07/21/2021- Paitent presents with no formal HEP in place. 08/21/2021- Patient reports compliance with HEP and states no questions as of today- able to perform standin using his rollator for support.    Time 6    Period Weeks    Status Achieved     Target Date 09/01/21               PT Long Term Goals - 11/10/21 1035       PT LONG TERM GOAL #1   Title Pt will improve FOTO to target score of 53  to display perceived improvements in ability to complete ADL's.    Baseline 07/21/2021= 45; 1/5=51. 10/12/2021- Just reassessed for progress note so did not reassess again- will continue with goal into new certification 9/23: 30%    Time 12    Period Weeks    Status On-going    Target Date 01/05/22      PT LONG TERM GOAL #2   Title Pt will improve BERG by at least 5 points in order to demonstrate clinically significant improvement in balance.    Baseline 07/21/2021= 39/56; 08/21/2021=46/56- Will keep goal active to ensure patient able to test consistently; 10/05/2021= 52/56    Time 12    Period Weeks    Status Achieved    Target Date 10/13/21      PT LONG TERM GOAL #3   Title Pt will decrease 5TSTS by at least 5 seconds in order to demonstrate clinically significant improvement in LE strength.    Baseline 07/21/2021= 21 sec without UE Support; 11/21= 14.0 sec without UE support-.Will Keep goal active to ensure consistency.  10/05/2021=14.75 sec without UE support  (improved from 21 sec and unchanged from last assessment of 14 sec)    Time 12    Period Weeks    Status Achieved    Target Date 10/13/21      PT LONG TERM GOAL #4   Title Pt will decrease TUG to below  18 seconds/decrease in order to demonstrate decreased fall risk.    Baseline 07/21/2021= 23 sec with 4WW; 08/21/2021= 22.13 sec with 4WW; 10/05/2021= 19.51 sec avg with 4WW 2/10: 15.4 seconds with cane    Time 12  Period Weeks    Status Achieved    Target Date 01/05/22      PT LONG TERM GOAL #5   Title Pt will increase 10MWT by at least 0.23 m/s in order to demonstrate clinically significant improvement in community ambulation.    Baseline 07/21/2021= 0.48m/s; 08/21/2021= 0.57 m/s using 4WW; 10/05/2021= 0.6 m/s avg using 4WW 2/10: 0.51 m/s with cane    Time 12     Period Weeks    Status Partially Met    Target Date 01/05/22                   Plan - 11/14/21 1248     Clinical Impression Statement Patient presented with good motivation for session today. He performed several balance and strengthening exercises but exhibited more fatigue/weakness in left LE today. He struggled with coordination of lifting leg over 1/2 foam roll, walking without dragging left LE than in previous session. The pt will benefit from further skilled PT to improve balance, strength, gait and safety with ADLs    Personal Factors and Comorbidities Comorbidity 3+    Comorbidities A- Fib, Renal cancer, Addison's disease, gout, arthritis, DM    Examination-Activity Limitations Caring for Others;Carry;Dressing;Lift;Reach Overhead;Squat;Stairs;Transfers;Stand    Examination-Participation Restrictions Community Activity;Cleaning;Driving;Yard Work;Shop    Stability/Clinical Decision Making Evolving/Moderate complexity    Rehab Potential Good    PT Frequency 2x / week    PT Duration 12 weeks    PT Treatment/Interventions ADLs/Self Care Home Management;Canalith Repostioning;Cryotherapy;Electrical Stimulation;Moist Heat;Traction;Ultrasound;DME Instruction;Gait training;Stair training;Functional mobility training;Therapeutic activities;Therapeutic exercise;Balance training;Neuromuscular re-education;Patient/family education;Orthotic Fit/Training;Wheelchair mobility training;Manual techniques;Passive range of motion;Dry needling;Energy conservation;Taping;Vestibular    PT Next Visit Plan Continue with progress LE strengthening; gait training for safe mobility, Neuromuscular re-ed for improved overall balance.    PT Home Exercise Plan 08/17/2021-Access Code: [email or web address]; no updates    Consulted and Agree with Plan of Care Patient;Family member/caregiver    Family Member Consulted WIfe- Tony Huffman             Patient will benefit from skilled therapeutic intervention in order to improve  the following deficits and impairments:  Abnormal gait, Decreased activity tolerance, Decreased balance, Decreased coordination, Decreased endurance, Decreased knowledge of use of DME, Decreased mobility, Decreased strength, Difficulty walking, Hypomobility, Impaired sensation, Impaired vision/preception, Postural dysfunction, Pain  Visit Diagnosis: Abnormality of gait and mobility  Difficulty in walking, not elsewhere classified  Muscle weakness (generalized)  Unsteadiness on feet     Problem List Patient Active Problem List   Diagnosis Date Noted   Addisons disease ([place]) 03/16/2021   Low HDL (under 40) 03/16/2021   Difficulty sleeping 11/21/2020   Numbness and tingling 11/21/2020   Difficulty walking 01/14/2020   History of recent fall 01/14/2020   Sleep disorder 01/14/2020   Pancreatic mass 08/01/2018   H/O sebaceous cyst 01/06/2018   Tubular adenoma 10/28/2017   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   Edema leg 03/07/2017   Hx of atrial flutter 03/07/2017   OSA (obstructive sleep apnea) 07/24/2016   RLS (restless legs syndrome) 07/24/2016   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   Atrial fibrillation and flutter ([place]) 10/20/2014   Hyperlipidemia 06/16/2014   Adrenal crisis ([place]) 04/19/2014   Cough 03/29/2014   Weakness 03/29/2014   Cough syncope 10/15/2013   URI (upper respiratory infection) 10/15/2013   Dyslipidemia 09/04/2013   Gout 09/04/2013   Chronic kidney disease, stage III (moderate) ([place]) 11/13/2011   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   Hypothyroidism (acquired) 11/30/2010   Malignant  neoplasm of adrenal gland ([place]) 11/10/2010    Lewis Moccasin, PT 11/14/2021, 4:09 PM  [place] MAIN [facility] SERVICES [address] [place], [place], [address] Phone: [phone]   Fax:  [phone]  Name: Tony Huffman MRN: [id number] Date of Birth: 1946-04-05

## 2021-11-17 ENCOUNTER — Ambulatory Visit: Payer: Medicare Other

## 2021-11-17 ENCOUNTER — Other Ambulatory Visit: Payer: Self-pay

## 2021-11-17 DIAGNOSIS — R269 Unspecified abnormalities of gait and mobility: Secondary | ICD-10-CM | POA: Diagnosis not present

## 2021-11-17 DIAGNOSIS — R2681 Unsteadiness on feet: Secondary | ICD-10-CM

## 2021-11-17 DIAGNOSIS — M6281 Muscle weakness (generalized): Secondary | ICD-10-CM

## 2021-11-17 DIAGNOSIS — R262 Difficulty in walking, not elsewhere classified: Secondary | ICD-10-CM

## 2021-11-17 NOTE — Therapy (Signed)
[place] MAIN [facility] SERVICES [address] [place], [place], [address] Phone: [phone]   Fax:  [phone]  Physical Therapy Treatment  Patient Details  Name: Tony Huffman MRN: [id number] Date of Birth: [birth year] Referring Provider (PT): Dr. Joselyn Arrow   Encounter Date: 11/17/2021   PT End of Session - 11/17/21 0844     Visit Number 32    Number of Visits 38    Date for PT Re-Evaluation 01/05/22    Authorization Type Medicare Part B; next session 1/10 PN 11/10/21    Authorization Time Period 07/21/2021- 10/13/2021; 10/13/2021-01/05/2022    Progress Note Due on Visit 30    PT Start Time 0843    PT Stop Time 0928    PT Time Calculation (min) 45 min    Equipment Utilized During Treatment Gait belt;Other (comment)    Activity Tolerance Patient tolerated treatment well    Behavior During Therapy [facility] for tasks assessed/performed             Past Medical History:  Diagnosis Date   Abnormal heart rhythm 10/20/2014   Addison's disease ([place])    Adrenal insufficiency ([place])    Allergy    Ambulates with cane    Anxiety    Arrhythmia    Cataract cortical, senile    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   Chronic kidney disease, stage 3 ([place]) 11/13/2011   Cough 03/29/2014   Edema leg 03/07/2017   Elevated cholesterol with high triglycerides 09/04/2013   Elevated red blood cell count    Erectile dysfunction    Falls frequently    Fever 03/29/2014   Generalized weakness 03/29/2014   GERD (gastroesophageal reflux disease)    Gout 09/04/2013   H/O adrenal insufficiency 05/17/2014   H/O atrial flutter 03/07/2017   H/O atrial flutter    H/O eye surgery    H/O sebaceous cyst 01/06/2018   H/O thyroid disease    H/O upper respiratory infection 10/15/2013   Hearing loss    History of back surgery    History of blepharoplasty    History of cancer 10/04/2015   History of chemotherapy    History of esophagogastroduodenoscopy (EGD)    History  of prediabetes    Hyperlipidemia 06/16/2014   Hypertension    Hypertriglyceridemia    Hypothyroidism (acquired) 04/14/2014   Left leg swelling 11/23/2016   Low HDL (under 40)    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[place]) 11/10/2010   Malignant neoplasm of unspecified kidney, except renal pelvis (HCC)    OSA (obstructive sleep apnea) 07/24/2016   OSA (obstructive sleep apnea)    PAF (paroxysmal atrial fibrillation) (HCC)    PAF (paroxysmal atrial fibrillation) ([place])    Pancreatic mass 08/01/2018   Renal carcinoma ([place]) 06/16/2015   RLS (restless legs syndrome) 07/24/2016   Sleep apnea    Testicular hypofunction    Tubular adenoma 10/28/2017   Tussive syncopes 10/15/2013   Type 2 diabetes mellitus ([place])    Vitamin D deficiency disease     Past Surgical History:  Procedure Laterality Date   ADRENALECTOMY     CATARACT EXTRACTION  10/2014   CATARACT EXTRACTION EXTRACAPSULAR  11/05/2016   with Insertion Intraocular Prostheis.    COLONOSCOPY  05/06/2017   with removal lesions by snare.   COLONOSCOPY W/ BIOPSIES     05/06/2017   NEPHRECTOMY     POPLITEAL SYNOVIAL CYST EXCISION     SPINE SURGERY  1989   THYROIDECTOMY     THYROIDECTOMY,  PARTIAL     TONSILLECTOMY      There were no vitals filed for this visit.   Subjective Assessment - 11/17/21 0844     Patient is accompained by: Family member   Tony Huffman   Pertinent History Patient is a 76 year old male with referral from Neurology- Dr. Joselyn Arrow for unspecified abnormality of gait. He presents today with his Tony and reports > 6 month history of progressive issues with poor balance and multiple falls. Patient has renal Cancer and currently has Pallative care- Was stabilized out of Hospice. Patient presents with the following past medical history: A-Fib and flutter, Lung Cancer, Obstructive sleep apnea, Upper respiratory infection, Addison's disease, Hypothyroidism, Renal Cancer, DM, gout, arthritis, Chronic kidney disease and  Restless leg syndrome. Patient lives with Tony in independent living at [facility]. Prior level of function was independent with transfers and mobility using walker yet some assist with dressing.    Limitations Lifting;Standing;Walking;House hold activities    How long can you sit comfortably? No issues    How long can you stand comfortably? <5 min    How long can you walk comfortably? <5 min    Diagnostic tests EXAM:  MRI HEAD WITHOUT CONTRAST     TECHNIQUE:  Multiplanar, multiecho pulse sequences of the brain and surrounding  structures were obtained without intravenous contrast.     COMPARISON:  CT head without contrast 01/20/2021     FINDINGS:  Brain: Moderate generalized atrophy is present. 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 Ventricles are enlarged  bilaterally. There is mild ventricular asymmetry, the right is  larger.     No acute infarct, hemorrhage, or mass lesion is present. No other  significant extra-axial collection is present.     The internal auditory canals are within normal limits. The brainstem  and cerebellum are within normal limits.     Vascular: Flow is present in the major intracranial arteries.     Skull and upper cervical spine: The craniocervical junction is  normal. Upper cervical spine is within normal limits. Marrow signal  is unremarkable.     Sinuses/Orbits: The paranasal sinuses and mastoid air cells are  clear. Bilateral lens replacements are noted. Globes and orbits are  otherwise unremarkable.     IMPRESSION:  1. No acute intracranial abnormality.  2. Moderate generalized atrophy and white matter disease likely  reflects the sequela of chronic microvascular ischemia.  3. Arachnoid cyst over the left convexity.    Patient Stated Goals I would like to walk better and not fall    Pain Onset 1 to 4 weeks ago              INTERVENTIONS:    Spent several minutes - discussing patient visit with [place] neurology yesterday including printout that patient  brought with assessment from Neurologist. Discussed assessment submitted by MD explaining apraxia of left LE and right brain hemisphere atrophy. Patient verbalized good understanding of details of visit and agreed that He and his MD thought that continuing PT would be beneficial.   Dynamic high knee march standing on airex pad x 3 min total- without UE support - Occasional reaching for bars  Gait in // bars with floor ladder positioned in // bars- x 5 min without UE support x 2 trials. Patient exhibited ability to take more reciprocal step with visual input. On 2nd trial- he struggled a bit more - requiring occasion UE support with decreased step length on  left but still reciprocal.   Obstacle course- Patient performed several rounds of walking around hedgehogs then one area of walking through narrow passageway- Focusing on turning, cutting and progressing left LE step. He exhibited difficulty at times stepping through around hedgehogs with left LE - had to stop and regroup several times.                              PT Education - 11/17/21 0844     Education Details Exercise technique    Person(s) Educated Patient    Methods Explanation;Demonstration;Tactile cues;Verbal cues    Comprehension Verbalized understanding;Returned demonstration;Verbal cues required;Tactile cues required;Need further instruction              PT Short Term Goals - 08/21/21 0814       PT SHORT TERM GOAL #1   Title Pt will be independent with HEP in order to improve strength and balance in order to decrease fall risk and improve function at home and work.    Baseline 07/21/2021- Paitent presents with no formal HEP in place. 08/21/2021- Patient reports compliance with HEP and states no questions as of today- able to perform standin using his rollator for support.    Time 6    Period Weeks    Status Achieved    Target Date 09/01/21               PT Long Term Goals - 11/10/21  1035       PT LONG TERM GOAL #1   Title Pt will improve FOTO to target score of 53  to display perceived improvements in ability to complete ADL's.    Baseline 07/21/2021= 45; 1/5=51. 10/12/2021- Just reassessed for progress note so did not reassess again- will continue with goal into new certification 8/41: 32%    Time 12    Period Weeks    Status On-going    Target Date 01/05/22      PT LONG TERM GOAL #2   Title Pt will improve BERG by at least 5 points in order to demonstrate clinically significant improvement in balance.    Baseline 07/21/2021= 39/56; 08/21/2021=46/56- Will keep goal active to ensure patient able to test consistently; 10/05/2021= 52/56    Time 12    Period Weeks    Status Achieved    Target Date 10/13/21      PT LONG TERM GOAL #3   Title Pt will decrease 5TSTS by at least 5 seconds in order to demonstrate clinically significant improvement in LE strength.    Baseline 07/21/2021= 21 sec without UE Support; 11/21= 14.0 sec without UE support-.Will Keep goal active to ensure consistency.  10/05/2021=14.75 sec without UE support  (improved from 21 sec and unchanged from last assessment of 14 sec)    Time 12    Period Weeks    Status Achieved    Target Date 10/13/21      PT LONG TERM GOAL #4   Title Pt will decrease TUG to below  18 seconds/decrease in order to demonstrate decreased fall risk.    Baseline 07/21/2021= 23 sec with 4WW; 08/21/2021= 22.13 sec with 4WW; 10/05/2021= 19.51 sec avg with 4WW 2/10: 15.4 seconds with cane    Time 12    Period Weeks    Status Achieved    Target Date 01/05/22      PT LONG TERM GOAL #5   Title Pt will increase 10MWT by at  least 0.23 m/s in order to demonstrate clinically significant improvement in community ambulation.    Baseline 07/21/2021= 0.76ms; 08/21/2021= 0.57 m/s using 4WW; 10/05/2021= 0.6 m/s avg using 4WW 2/10: 0.51 m/s with cane    Time 12    Period Weeks    Status Partially Met    Target Date 01/05/22                    Plan - 11/17/21 0845     Clinical Impression Statement Patient presented today with good motivation. Today's focus was on gait endurance while trying to utilize visual input (ladder) to enhance step length and consistency of stepping. Patient performed very well for 1st trial of 5 min of walking yet fatigue limited 2nd trial- still more reciprocal than without use of ladder. The pt will benefit from further skilled PT to improve balance, strength, gait and safety with ADLs    Personal Factors and Comorbidities Comorbidity 3+    Comorbidities A- Fib, Renal cancer, Addison's disease, gout, arthritis, DM    Examination-Activity Limitations Caring for Others;Carry;Dressing;Lift;Reach Overhead;Squat;Stairs;Transfers;Stand    Examination-Participation Restrictions Community Activity;Cleaning;Driving;Yard Work;Shop    Stability/Clinical Decision Making Evolving/Moderate complexity    Rehab Potential Good    PT Frequency 2x / week    PT Duration 12 weeks    PT Treatment/Interventions ADLs/Self Care Home Management;Canalith Repostioning;Cryotherapy;Electrical Stimulation;Moist Heat;Traction;Ultrasound;DME Instruction;Gait training;Stair training;Functional mobility training;Therapeutic activities;Therapeutic exercise;Balance training;Neuromuscular re-education;Patient/family education;Orthotic Fit/Training;Wheelchair mobility training;Manual techniques;Passive range of motion;Dry needling;Energy conservation;Taping;Vestibular    PT Next Visit Plan Continue with progress LE strengthening; gait training for safe mobility, Neuromuscular re-ed for improved overall balance.    PT Home Exercise Plan 08/17/2021-Access Code: [email or web address]; no updates    Consulted and Agree with Plan of Care Patient;Family member/caregiver    Family Member Consulted Tony- Huffman             Patient will benefit from skilled therapeutic intervention in order to improve the following deficits and impairments:  Abnormal  gait, Decreased activity tolerance, Decreased balance, Decreased coordination, Decreased endurance, Decreased knowledge of use of DME, Decreased mobility, Decreased strength, Difficulty walking, Hypomobility, Impaired sensation, Impaired vision/preception, Postural dysfunction, Pain  Visit Diagnosis: Difficulty in walking, not elsewhere classified  Abnormality of gait and mobility  Muscle weakness (generalized)  Unsteadiness on feet     Problem List Patient Active Problem List   Diagnosis Date Noted   Addisons disease ([place] 03/16/2021   Low HDL (under 40) 03/16/2021   Difficulty sleeping 11/21/2020   Numbness and tingling 11/21/2020   Difficulty walking 01/14/2020   History of recent fall 01/14/2020   Sleep disorder 01/14/2020   Pancreatic mass 08/01/2018   H/O sebaceous cyst 01/06/2018   Tubular adenoma 10/28/2017   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   Edema leg 03/07/2017   Hx of atrial flutter 03/07/2017   OSA (obstructive sleep apnea) 07/24/2016   RLS (restless legs syndrome) 07/24/2016   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   Atrial [place] 10/20/2014   Hyperlipidemia 06/16/2014   [place]al crisis ([place] 04/19/2014   Cough 03/29/2014   Weakness 03/29/2014   Cough syncope 10/15/2013   URI (upper respiratory infection) 10/15/2013   Dyslipidemia 09/04/2013   Gout 09/04/2013   Chronic kidney disease, stage III ([place]) ([place] 11/13/2011   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   Hypothyroidism (acquired) 11/30/2010   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[place] 11/10/2010    Tony Huffman PT 11/17/2021, 9:57 AM  [place]  MAIN [facility] SERVICES [place], [place], [address] Phone: [phone]   Fax:  [phone]  Name: Tony Huffman MRN: [id number] Date of Birth: [birth year]

## 2021-11-21 ENCOUNTER — Ambulatory Visit: Payer: Medicare Other

## 2021-11-21 ENCOUNTER — Other Ambulatory Visit: Payer: Self-pay

## 2021-11-21 DIAGNOSIS — R269 Unspecified abnormalities of gait and mobility: Secondary | ICD-10-CM | POA: Diagnosis not present

## 2021-11-21 DIAGNOSIS — M6281 Muscle weakness (generalized): Secondary | ICD-10-CM

## 2021-11-21 DIAGNOSIS — R262 Difficulty in walking, not elsewhere classified: Secondary | ICD-10-CM

## 2021-11-21 DIAGNOSIS — R2681 Unsteadiness on feet: Secondary | ICD-10-CM

## 2021-11-21 NOTE — Therapy (Signed)
[place] MAIN [facility] SERVICES [address] [place], [place], [address] Phone: [phone]   Fax:  [phone]  Physical Therapy Treatment  Patient Details  Name: Tony Huffman MRN: [id number] Date of Birth: May 28, 1946 Referring Provider (PT): Dr. Joselyn Arrow   Encounter Date: 11/21/2021   PT End of Session - 11/21/21 0808     Visit Number 33    Number of Visits 41    Date for PT Re-Evaluation 01/05/22    Authorization Type Medicare Part B; next session 1/10 PN 11/10/21    Authorization Time Period 07/21/2021- 10/13/2021; 10/13/2021-01/05/2022    Progress Note Due on Visit 30    PT Start Time 0800    PT Stop Time 0844    PT Time Calculation (min) 44 min    Equipment Utilized During Treatment Gait belt;Other (comment)    Activity Tolerance Patient tolerated treatment well    Behavior During Therapy [facility] for tasks assessed/performed             Past Medical History:  Diagnosis Date   Abnormal heart rhythm 10/20/2014   Addison's disease ([place])    Adrenal insufficiency ([place])    Allergy    Ambulates with cane    Anxiety    Arrhythmia    Cataract cortical, senile    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   Chronic kidney disease, stage 3 ([place]) 11/13/2011   Cough 03/29/2014   Edema leg 03/07/2017   Elevated cholesterol with high triglycerides 09/04/2013   Elevated red blood cell count    Erectile dysfunction    Falls frequently    Fever 03/29/2014   Generalized weakness 03/29/2014   GERD (gastroesophageal reflux disease)    Gout 09/04/2013   H/O adrenal insufficiency 05/17/2014   H/O atrial flutter 03/07/2017   H/O atrial flutter    H/O eye surgery    H/O sebaceous cyst 01/06/2018   H/O thyroid disease    H/O upper respiratory infection 10/15/2013   Hearing loss    History of back surgery    History of blepharoplasty    History of cancer 10/04/2015   History of chemotherapy    History of esophagogastroduodenoscopy (EGD)    History  of prediabetes    Hyperlipidemia 06/16/2014   Hypertension    Hypertriglyceridemia    Hypothyroidism (acquired) 04/14/2014   Left leg swelling 11/23/2016   Low HDL (under 40)    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[place]) 11/10/2010   Malignant neoplasm of unspecified kidney, except renal pelvis (HCC)    OSA (obstructive sleep apnea) 07/24/2016   OSA (obstructive sleep apnea)    PAF (paroxysmal atrial fibrillation) (HCC)    PAF (paroxysmal atrial fibrillation) ([place])    Pancreatic mass 08/01/2018   Renal carcinoma ([place]) 06/16/2015   RLS (restless legs syndrome) 07/24/2016   Sleep apnea    Testicular hypofunction    Tubular adenoma 10/28/2017   Tussive syncopes 10/15/2013   Type 2 diabetes mellitus ([place])    Vitamin D deficiency disease     Past Surgical History:  Procedure Laterality Date   ADRENALECTOMY     CATARACT EXTRACTION  10/2014   CATARACT EXTRACTION EXTRACAPSULAR  11/05/2016   with Insertion Intraocular Prostheis.    COLONOSCOPY  05/06/2017   with removal lesions by snare.   COLONOSCOPY W/ BIOPSIES     05/06/2017   NEPHRECTOMY     POPLITEAL SYNOVIAL CYST EXCISION     SPINE SURGERY  1989   THYROIDECTOMY     THYROIDECTOMY,  PARTIAL     TONSILLECTOMY      There were no vitals filed for this visit.   Subjective Assessment - 11/21/21 0803     Subjective Patient reports that he had a good weekend without any new complaints. Reports continuing to walk with cane    Patient is accompained by: Family member   [name]   Pertinent History Patient is a 76 year old male with referral from Neurology- Dr. Joselyn Arrow for unspecified abnormality of gait. He presents today with his wife and reports > 6 month history of progressive issues with poor balance and multiple falls. Patient has renal Cancer and currently has Pallative care- Was stabilized out of Hospice. Patient presents with the following past medical history: A-Fib and flutter, Lung Cancer, Obstructive sleep apnea, Upper  respiratory infection, Addison's disease, Hypothyroidism, Renal Cancer, DM, gout, arthritis, Chronic kidney disease and Restless leg syndrome. Patient lives with wife in independent living at [facility]. Prior level of function was independent with transfers and mobility using walker yet some assist with dressing.    Limitations Lifting;Standing;Walking;House hold activities    How long can you sit comfortably? No issues    How long can you stand comfortably? <5 min    How long can you walk comfortably? <5 min    Diagnostic tests EXAM:  MRI HEAD WITHOUT CONTRAST     TECHNIQUE:  Multiplanar, multiecho pulse sequences of the brain and surrounding  structures were obtained without intravenous contrast.     COMPARISON:  CT head without contrast 01/20/2021     FINDINGS:  Brain: Moderate generalized atrophy is present. 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 Ventricles are enlarged  bilaterally. There is mild ventricular asymmetry, the right is  larger.     No acute infarct, hemorrhage, or mass lesion is present. No other  significant extra-axial collection is present.     The internal auditory canals are within normal limits. The brainstem  and cerebellum are within normal limits.     Vascular: Flow is present in the major intracranial arteries.     Skull and upper cervical spine: The craniocervical junction is  normal. Upper cervical spine is within normal limits. Marrow signal  is unremarkable.     Sinuses/Orbits: The paranasal sinuses and mastoid air cells are  clear. Bilateral lens replacements are noted. Globes and orbits are  otherwise unremarkable.     IMPRESSION:  1. No acute intracranial abnormality.  2. Moderate generalized atrophy and white matter disease likely  reflects the sequela of chronic microvascular ischemia.  3. Arachnoid cyst over the left convexity.    Patient Stated Goals I would like to walk better and not fall    Currently in Pain? No/denies    Pain Onset 1 to 4 weeks ago              INTERVENTIONS:   Seated ham curls- GTB x 15 reps BLE  Seated Hip abd GTB x 15 reps BLE   Standing Hip abd with GTB around distal quads- x length of // bars and no UE support x 3.   Dyanmic walking in // bars with floor ladder placed in // bars- 3lb AW BLE x 2 min 15 sec.   Step forward with "High knee march" each LE into each square of ladder x 5 trials- Much more challenging  standing on blue airex pad with BLE (3lb AW) - step forward with 1 LE onto Green therapad in front  of blue airex pad x 10 reps each LE.  Obstacle Course: Including stepping on blue airex pad, firm surface, onto green therapad, over 1/2 foam. Over red mat with hedgehogs placed underneath for unlevel surface, ladder with cues to step 1 foot into one square x 2 trials today. 1st trial - patient performed well with CGA and use of gait belt but 2nd trial - obviously more weak and requiring more assistance. he was  tired at end experiencing more difficulty coordinating.                             PT Education - 11/21/21 0805     Education Details Exercise technique    Person(s) Educated Patient    Methods Explanation;Demonstration;Tactile cues;Verbal cues    Comprehension Verbalized understanding;Returned demonstration;Verbal cues required;Tactile cues required;Need further instruction              PT Short Term Goals - 08/21/21 0814       PT SHORT TERM GOAL #1   Title Pt will be independent with HEP in order to improve strength and balance in order to decrease fall risk and improve function at home and work.    Baseline 07/21/2021- Paitent presents with no formal HEP in place. 08/21/2021- Patient reports compliance with HEP and states no questions as of today- able to perform standin using his rollator for support.    Time 6    Period Weeks    Status Achieved    Target Date 09/01/21               PT Long Term Goals - 11/10/21 1035       PT LONG TERM GOAL  #1   Title Pt will improve FOTO to target score of 53  to display perceived improvements in ability to complete ADL's.    Baseline 07/21/2021= 45; 1/5=51. 10/12/2021- Just reassessed for progress note so did not reassess again- will continue with goal into new certification 4/81: 85%    Time 12    Period Weeks    Status On-going    Target Date 01/05/22      PT LONG TERM GOAL #2   Title Pt will improve BERG by at least 5 points in order to demonstrate clinically significant improvement in balance.    Baseline 07/21/2021= 39/56; 08/21/2021=46/56- Will keep goal active to ensure patient able to test consistently; 10/05/2021= 52/56    Time 12    Period Weeks    Status Achieved    Target Date 10/13/21      PT LONG TERM GOAL #3   Title Pt will decrease 5TSTS by at least 5 seconds in order to demonstrate clinically significant improvement in LE strength.    Baseline 07/21/2021= 21 sec without UE Support; 11/21= 14.0 sec without UE support-.Will Keep goal active to ensure consistency.  10/05/2021=14.75 sec without UE support  (improved from 21 sec and unchanged from last assessment of 14 sec)    Time 12    Period Weeks    Status Achieved    Target Date 10/13/21      PT LONG TERM GOAL #4   Title Pt will decrease TUG to below  18 seconds/decrease in order to demonstrate decreased fall risk.    Baseline 07/21/2021= 23 sec with 4WW; 08/21/2021= 22.13 sec with 4WW; 10/05/2021= 19.51 sec avg with 4WW 2/10: 15.4 seconds with cane    Time 12    Period Weeks  Status Achieved    Target Date 01/05/22      PT LONG TERM GOAL #5   Title Pt will increase 10MWT by at least 0.23 m/s in order to demonstrate clinically significant improvement in community ambulation.    Baseline 07/21/2021= 0.56ms; 08/21/2021= 0.57 m/s using 4WW; 10/05/2021= 0.6 m/s avg using 4WW 2/10: 0.51 m/s with cane    Time 12    Period Weeks    Status Partially Met    Target Date 01/05/22                   Plan - 11/21/21  0809     Clinical Impression Statement Patient presents today with good motivation for today's session. He started off well- good use of left LE but during strength/coordination activities exhibited noticeable fatigue. He performed well again with use of ladder - the visual cue appear to help him facilitate moving the left LE. The pt will benefit from further skilled PT to improve balance, strength, gait and safety with ADLs    Personal Factors and Comorbidities Comorbidity 3+    Comorbidities A- Fib, Renal cancer, Addison's disease, gout, arthritis, DM    Examination-Activity Limitations Caring for Others;Carry;Dressing;Lift;Reach Overhead;Squat;Stairs;Transfers;Stand    Examination-Participation Restrictions Community Activity;Cleaning;Driving;Yard Work;Shop    Stability/Clinical Decision Making Evolving/Moderate complexity    Rehab Potential Good    PT Frequency 2x / week    PT Duration 12 weeks    PT Treatment/Interventions ADLs/Self Care Home Management;Canalith Repostioning;Cryotherapy;Electrical Stimulation;Moist Heat;Traction;Ultrasound;DME Instruction;Gait training;Stair training;Functional mobility training;Therapeutic activities;Therapeutic exercise;Balance training;Neuromuscular re-education;Patient/family education;Orthotic Fit/Training;Wheelchair mobility training;Manual techniques;Passive range of motion;Dry needling;Energy conservation;Taping;Vestibular    PT Next Visit Plan Continue with progress LE strengthening; gait training for safe mobility, Neuromuscular re-ed for improved overall balance.    PT Home Exercise Plan 08/17/2021-Access Code: [email or web address]; no updates    Consulted and Agree with Plan of Care Patient;Family member/caregiver    Family Member Consulted WIfe- [name]             Patient will benefit from skilled therapeutic intervention in order to improve the following deficits and impairments:  Abnormal gait, Decreased activity tolerance, Decreased balance, Decreased  coordination, Decreased endurance, Decreased knowledge of use of DME, Decreased mobility, Decreased strength, Difficulty walking, Hypomobility, Impaired sensation, Impaired vision/preception, Postural dysfunction, Pain  Visit Diagnosis: Abnormality of gait and mobility  Difficulty in walking, not elsewhere classified  Muscle weakness (generalized)  Unsteadiness on feet     Problem List Patient Active Problem List   Diagnosis Date Noted   Addisons disease ([place] 03/16/2021   Low HDL (under 40) 03/16/2021   Difficulty sleeping 11/21/2020   Numbness and tingling 11/21/2020   Difficulty walking 01/14/2020   History of recent fall 01/14/2020   Sleep disorder 01/14/2020   Pancreatic mass 08/01/2018   H/O sebaceous cyst 01/06/2018   Tubular adenoma 10/28/2017   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   Edema leg 03/07/2017   Hx of atrial flutter 03/07/2017   OSA (obstructive sleep apnea) 07/24/2016   RLS (restless legs syndrome) 07/24/2016   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 MAIN [facility] SERVICES [address] [place], [place], [address] Phone: [phone]   Fax:  [phone]  Name: Tony Huffman MRN: [id number] Date of Birth: 06-14-1946

## 2021-11-24 ENCOUNTER — Ambulatory Visit: Payer: Medicare Other

## 2021-11-24 ENCOUNTER — Other Ambulatory Visit: Payer: Self-pay

## 2021-11-24 DIAGNOSIS — R269 Unspecified abnormalities of gait and mobility: Secondary | ICD-10-CM | POA: Diagnosis not present

## 2021-11-24 DIAGNOSIS — M6281 Muscle weakness (generalized): Secondary | ICD-10-CM

## 2021-11-24 DIAGNOSIS — R2681 Unsteadiness on feet: Secondary | ICD-10-CM

## 2021-11-24 DIAGNOSIS — R278 Other lack of coordination: Secondary | ICD-10-CM

## 2021-11-24 DIAGNOSIS — R2689 Other abnormalities of gait and mobility: Secondary | ICD-10-CM

## 2021-11-24 NOTE — Therapy (Signed)
[place] MAIN [facility] SERVICES [address] [place], [place], [address] Phone: [phone]   Fax:  [phone]  Physical Therapy Treatment  Patient Details  Name: Tony Huffman MRN: [id number] Date of Birth: 18-Feb-1946 Referring Provider (PT): Dr. Joselyn Arrow   Encounter Date: 11/24/2021   PT End of Session - 11/24/21 0942     Visit Number 34    Number of Visits 64    Date for PT Re-Evaluation 01/05/22    Authorization Type Medicare Part B; next session 1/10 PN 11/10/21    Authorization Time Period 07/21/2021- 10/13/2021; 10/13/2021-01/05/2022    Progress Note Due on Visit 79    PT Start Time 0846    PT Stop Time 0931    PT Time Calculation (min) 45 min    Equipment Utilized During Treatment Gait belt    Activity Tolerance Patient tolerated treatment well    Behavior During Therapy [facility] for tasks assessed/performed             Past Medical History:  Diagnosis Date   Abnormal heart rhythm 10/20/2014   Addison's disease ([place])    Adrenal insufficiency ([place])    Allergy    Ambulates with cane    Anxiety    Arrhythmia    Cataract cortical, senile    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   Chronic kidney disease, stage 3 ([place]) 11/13/2011   Cough 03/29/2014   Edema leg 03/07/2017   Elevated cholesterol with high triglycerides 09/04/2013   Elevated red blood cell count    Erectile dysfunction    Falls frequently    Fever 03/29/2014   Generalized weakness 03/29/2014   GERD (gastroesophageal reflux disease)    Gout 09/04/2013   H/O adrenal insufficiency 05/17/2014   H/O atrial flutter 03/07/2017   H/O atrial flutter    H/O eye surgery    H/O sebaceous cyst 01/06/2018   H/O thyroid disease    H/O upper respiratory infection 10/15/2013   Hearing loss    History of back surgery    History of blepharoplasty    History of cancer 10/04/2015   History of chemotherapy    History of esophagogastroduodenoscopy (EGD)    History of prediabetes     Hyperlipidemia 06/16/2014   Hypertension    Hypertriglyceridemia    Hypothyroidism (acquired) 04/14/2014   Left leg swelling 11/23/2016   Low HDL (under 40)    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[place]) 11/10/2010   Malignant neoplasm of unspecified kidney, except renal pelvis (HCC)    OSA (obstructive sleep apnea) 07/24/2016   OSA (obstructive sleep apnea)    PAF (paroxysmal atrial fibrillation) (HCC)    PAF (paroxysmal atrial fibrillation) ([place])    Pancreatic mass 08/01/2018   Renal carcinoma ([place]) 06/16/2015   RLS (restless legs syndrome) 07/24/2016   Sleep apnea    Testicular hypofunction    Tubular adenoma 10/28/2017   Tussive syncopes 10/15/2013   Type 2 diabetes mellitus ([place])    Vitamin D deficiency disease     Past Surgical History:  Procedure Laterality Date   ADRENALECTOMY     CATARACT EXTRACTION  10/2014   CATARACT EXTRACTION EXTRACAPSULAR  11/05/2016   with Insertion Intraocular Prostheis.    COLONOSCOPY  05/06/2017   with removal lesions by snare.   COLONOSCOPY W/ BIOPSIES     05/06/2017   NEPHRECTOMY     POPLITEAL SYNOVIAL CYST EXCISION     SPINE SURGERY  1989   THYROIDECTOMY     THYROIDECTOMY, PARTIAL  TONSILLECTOMY      There were no vitals filed for this visit.   Subjective Assessment - 11/24/21 0847     Subjective Pt reports no changes since previous session. Pt reports he ambulated to PT session with SPC and without use of chair. Spouse present for session.    Patient is accompained by: Family member   [name]   Pertinent History Patient is a 76 year old male with referral from Neurology- Dr. Joselyn Arrow for unspecified abnormality of gait. He presents today with his wife and reports > 6 month history of progressive issues with poor balance and multiple falls. Patient has renal Cancer and currently has Pallative care- Was stabilized out of Hospice. Patient presents with the following past medical history: A-Fib and flutter, Lung Cancer,  Obstructive sleep apnea, Upper respiratory infection, Addison's disease, Hypothyroidism, Renal Cancer, DM, gout, arthritis, Chronic kidney disease and Restless leg syndrome. Patient lives with wife in independent living at [facility]. Prior level of function was independent with transfers and mobility using walker yet some assist with dressing.    Limitations Lifting;Standing;Walking;House hold activities    How long can you sit comfortably? No issues    How long can you stand comfortably? <5 min    How long can you walk comfortably? <5 min    Diagnostic tests EXAM:  MRI HEAD WITHOUT CONTRAST     TECHNIQUE:  Multiplanar, multiecho pulse sequences of the brain and surrounding  structures were obtained without intravenous contrast.     COMPARISON:  CT head without contrast 01/20/2021     FINDINGS:  Brain: Moderate generalized atrophy is present. 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 Ventricles are enlarged  bilaterally. There is mild ventricular asymmetry, the right is  larger.     No acute infarct, hemorrhage, or mass lesion is present. No other  significant extra-axial collection is present.     The internal auditory canals are within normal limits. The brainstem  and cerebellum are within normal limits.     Vascular: Flow is present in the major intracranial arteries.     Skull and upper cervical spine: The craniocervical junction is  normal. Upper cervical spine is within normal limits. Marrow signal  is unremarkable.     Sinuses/Orbits: The paranasal sinuses and mastoid air cells are  clear. Bilateral lens replacements are noted. Globes and orbits are  otherwise unremarkable.     IMPRESSION:  1. No acute intracranial abnormality.  2. Moderate generalized atrophy and white matter disease likely  reflects the sequela of chronic microvascular ischemia.  3. Arachnoid cyst over the left convexity.    Patient Stated Goals I would like to walk better and not fall    Currently in Pain? No/denies     Pain Onset 1 to 4 weeks ago    Pain Onset More than a month ago             INTERVENTIONS: Gait belt donned and PT providing contact-guard assist throughout unless otherwise specified   Seated hamstring curls- GTB 2x 20 reps BLE; Pt rates easy on RLE and medium on LLE    Seated Hip abd GTB 2x20 reps BLE with TC    STS 2x10; Pt rates as difficult  Standing Hip abd with 2.5# RLE and 2# LLE 2x10 each LE  Dyanmic walking in // bars with floor ladder placed in // bars- 3lb AW BLE x 6x length of bars -- Performed with addition of cones  for cone taps (lateral cone tap and crossover cone tap) --addition of cones for cone taps and cog dual task Comments: Pt challenged with cog dual task. Knocks over cones 3x. Encouraged to perform without UE support.  Cueing to increase foot clearance for left lower extremity.  Step forward with "High knee march" each LE into each square of ladder x 8 trials with addition of cog dual task (counting backwards). Difficulty maintain LLE amplitude   NBOS, EC, firm surface with vertical head turns x 60 sec.  PT provides up to min assist due to increased sway.  Pt educated throughout session about proper posture and technique with exercises. Improved exercise technique, movement at target joints, use of target muscles after min to mod verbal, visual, tactile cues.  Note: Portions of this document were prepared using Dragon voice recognition software and although reviewed may contain unintentional dictation errors in syntax, grammar, or spelling.      PT Education - 11/24/21 0941     Education Details Exercise technique, body mechanics    Person(s) Educated Patient    Methods Explanation;Demonstration;Verbal cues    Comprehension Verbalized understanding;Returned demonstration;Need further instruction              PT Short Term Goals - 08/21/21 4332       PT SHORT TERM GOAL #1   Title Pt will be independent with HEP in order to improve  strength and balance in order to decrease fall risk and improve function at home and work.    Baseline 07/21/2021- Paitent presents with no formal HEP in place. 08/21/2021- Patient reports compliance with HEP and states no questions as of today- able to perform standin using his rollator for support.    Time 6    Period Weeks    Status Achieved    Target Date 09/01/21               PT Long Term Goals - 11/10/21 1035       PT LONG TERM GOAL #1   Title Pt will improve FOTO to target score of 53  to display perceived improvements in ability to complete ADL's.    Baseline 07/21/2021= 45; 1/5=51. 10/12/2021- Just reassessed for progress note so did not reassess again- will continue with goal into new certification 9/51: 88%    Time 12    Period Weeks    Status On-going    Target Date 01/05/22      PT LONG TERM GOAL #2   Title Pt will improve BERG by at least 5 points in order to demonstrate clinically significant improvement in balance.    Baseline 07/21/2021= 39/56; 08/21/2021=46/56- Will keep goal active to ensure patient able to test consistently; 10/05/2021= 52/56    Time 12    Period Weeks    Status Achieved    Target Date 10/13/21      PT LONG TERM GOAL #3   Title Pt will decrease 5TSTS by at least 5 seconds in order to demonstrate clinically significant improvement in LE strength.    Baseline 07/21/2021= 21 sec without UE Support; 11/21= 14.0 sec without UE support-.Will Keep goal active to ensure consistency.  10/05/2021=14.75 sec without UE support  (improved from 21 sec and unchanged from last assessment of 14 sec)    Time 12    Period Weeks    Status Achieved    Target Date 10/13/21      PT LONG TERM GOAL #4   Title Pt will decrease TUG  to below  18 seconds/decrease in order to demonstrate decreased fall risk.    Baseline 07/21/2021= 23 sec with 4WW; 08/21/2021= 22.13 sec with 4WW; 10/05/2021= 19.51 sec avg with 4WW 2/10: 15.4 seconds with cane    Time 12    Period Weeks     Status Achieved    Target Date 01/05/22      PT LONG TERM GOAL #5   Title Pt will increase 10MWT by at least 0.23 m/s in order to demonstrate clinically significant improvement in community ambulation.    Baseline 07/21/2021= 0.46ms; 08/21/2021= 0.57 m/s using 4WW; 10/05/2021= 0.6 m/s avg using 4WW 2/10: 0.51 m/s with cane    Time 12    Period Weeks    Status Partially Met    Target Date 01/05/22                   Plan - 11/24/21 06144    Clinical Impression Statement Patient presented with excellent motivation to participate in PT session.  The patient showed progress by performing interventions with increased number of repetitions.  Patient tolerated interventions well without pain or excessive fatigue.  The patient continued to be most challenged with dual task activities that required him to also maintain left lower extremity foot clearance.  The patient will benefit from further skilled PT to improve balance, strength, gait and safety with ADLs.    Personal Factors and Comorbidities Comorbidity 3+    Comorbidities A- Fib, Renal cancer, Addison's disease, gout, arthritis, DM    Examination-Activity Limitations Caring for Others;Carry;Dressing;Lift;Reach Overhead;Squat;Stairs;Transfers;Stand    Examination-Participation Restrictions CInvestment banker, operationalYard Work;Shop    Stability/Clinical Decision Making Evolving/Moderate complexity    Rehab Potential Good    PT Frequency 2x / week    PT Duration 12 weeks    PT Treatment/Interventions ADLs/Self Care Home Management;Canalith Repostioning;Cryotherapy;Electrical Stimulation;Moist Heat;Traction;Ultrasound;DME Instruction;Gait training;Stair training;Functional mobility training;Therapeutic activities;Therapeutic exercise;Balance training;Neuromuscular re-education;Patient/family education;Orthotic Fit/Training;Wheelchair mobility training;Manual techniques;Passive range of motion;Dry needling;Energy  conservation;Taping;Vestibular    PT Next Visit Plan Continue with progress LE strengthening; gait training for safe mobility, Neuromuscular re-ed for improved overall balance.  Continue plan of care as previously indicated    PT Home Exercise Plan 08/17/2021-Access Code: [email or web address]; no updates    Consulted and Agree with Plan of Care Patient;Family member/caregiver    Family Member Consulted WIfe- [name]             Patient will benefit from skilled therapeutic intervention in order to improve the following deficits and impairments:  Abnormal gait, Decreased activity tolerance, Decreased balance, Decreased coordination, Decreased endurance, Decreased knowledge of use of DME, Decreased mobility, Decreased strength, Difficulty walking, Hypomobility, Impaired sensation, Impaired vision/preception, Postural dysfunction, Pain  Visit Diagnosis: Muscle weakness (generalized)  Unsteadiness on feet  Other abnormalities of gait and mobility  Other lack of coordination     Problem List Patient Active Problem List   Diagnosis Date Noted   [place] 03/16/[place]   Low HDL (under 40) 03/16/2021   Difficulty sleeping 11/21/2020   Numbness and tingling 11/21/2020   Difficulty walking 01/14/2020   History of recent fall 01/14/2020   Sleep disorder 01/14/2020   Pancreatic mass 08/01/2018   H/O sebaceous cyst 01/06/2018   Tubular adenoma 10/28/2017   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   Edema leg 03/07/2017   Hx of atrial flutter 03/07/2017   OSA (obstructive sleep apnea) 07/24/2016   RLS (restless legs syndrome) 07/24/2016   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 10/04/2015   Atrial  fibrillation and flutter ([place]) 10/20/2014   Hyperlipidemia 06/16/2014   Adrenal crisis ([place]) 04/19/2014   Cough 03/29/2014   Weakness 03/29/2014   Cough syncope 10/15/2013   URI (upper respiratory infection) 10/15/2013   Dyslipidemia 09/04/2013   Gout 09/04/2013   Chronic kidney  disease, stage III (moderate) ([place]) 11/13/2011   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   Hypothyroidism (acquired) 11/30/2010   Malignant neoplasm of adrenal gland [facility]) 11/10/2010    Zollie Pee, PT 11/24/2021, 9:47 AM  [place] MAIN [facility] SERVICES [address] [place], [place], [address] Phone: [phone]   Fax:  [phone]  Name: Tony Huffman MRN: [id number] Date of Birth: 1945-11-21

## 2021-11-28 ENCOUNTER — Ambulatory Visit: Payer: Medicare Other

## 2021-11-28 ENCOUNTER — Other Ambulatory Visit: Payer: Self-pay

## 2021-11-28 DIAGNOSIS — R2681 Unsteadiness on feet: Secondary | ICD-10-CM

## 2021-11-28 DIAGNOSIS — R262 Difficulty in walking, not elsewhere classified: Secondary | ICD-10-CM

## 2021-11-28 DIAGNOSIS — R269 Unspecified abnormalities of gait and mobility: Secondary | ICD-10-CM | POA: Diagnosis not present

## 2021-11-28 DIAGNOSIS — M6281 Muscle weakness (generalized): Secondary | ICD-10-CM

## 2021-11-28 NOTE — Therapy (Signed)
Tony Huffman [facility] SERVICES [address] [place], [place], [address] Phone: [phone]   Fax:  [phone]  Physical Therapy Treatment  Patient Details  Name: Tony Huffman MRN: [id number] Date of Birth: 10/29/1945 Referring Provider (PT): Dr. Joselyn Arrow   Encounter Date: 11/28/2021   PT End of Session - 11/28/21 0751     Visit Number 35    Number of Visits 65    Date for PT Re-Evaluation 01/05/22    Authorization Type Medicare Part B; next session 1/10 PN 11/10/21    Authorization Time Period 07/21/2021- 10/13/2021; 10/13/2021-01/05/2022    Progress Note Due on Visit 29    PT Start Time 0755    PT Stop Time 0840    PT Time Calculation (min) 45 min    Equipment Utilized During Treatment Gait belt    Activity Tolerance Patient tolerated treatment well    Behavior During Therapy [facility] for tasks assessed/performed             Past Medical History:  Diagnosis Date   Abnormal heart rhythm 10/20/2014   Addison's disease ([place])    Adrenal insufficiency ([place])    Allergy    Ambulates with cane    Anxiety    Arrhythmia    Cataract cortical, senile    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   Chronic kidney disease, stage 3 ([place]) 11/13/2011   Cough 03/29/2014   Edema leg 03/07/2017   Elevated cholesterol with high triglycerides 09/04/2013   Elevated red blood cell count    Erectile dysfunction    Falls frequently    Fever 03/29/2014   Generalized weakness 03/29/2014   GERD (gastroesophageal reflux disease)    Gout 09/04/2013   H/O adrenal insufficiency 05/17/2014   H/O atrial flutter 03/07/2017   H/O atrial flutter    H/O eye surgery    H/O sebaceous cyst 01/06/2018   H/O thyroid disease    H/O upper respiratory infection 10/15/2013   Hearing loss    History of back surgery    History of blepharoplasty    History of cancer 10/04/2015   History of chemotherapy    History of esophagogastroduodenoscopy (EGD)    History of prediabetes     Hyperlipidemia 06/16/2014   Hypertension    Hypertriglyceridemia    Hypothyroidism (acquired) 04/14/2014   Left leg swelling 11/23/2016   Low HDL (under 40)    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[place]) 11/10/2010   Malignant neoplasm of unspecified kidney, except renal pelvis (HCC)    OSA (obstructive sleep apnea) 07/24/2016   OSA (obstructive sleep apnea)    PAF (paroxysmal atrial fibrillation) (HCC)    PAF (paroxysmal atrial fibrillation) ([place])    Pancreatic mass 08/01/2018   Renal carcinoma ([place]) 06/16/2015   RLS (restless legs syndrome) 07/24/2016   Sleep apnea    Testicular hypofunction    Tubular adenoma 10/28/2017   Tussive syncopes 10/15/2013   Type 2 diabetes mellitus ([place])    Vitamin D deficiency disease     Past Surgical History:  Procedure Laterality Date   ADRENALECTOMY     CATARACT EXTRACTION  10/2014   CATARACT EXTRACTION EXTRACAPSULAR  11/05/2016   with Insertion Intraocular Prostheis.    COLONOSCOPY  05/06/2017   with removal lesions by snare.   COLONOSCOPY W/ BIOPSIES     05/06/2017   NEPHRECTOMY     POPLITEAL SYNOVIAL CYST EXCISION     SPINE SURGERY  1989   THYROIDECTOMY     THYROIDECTOMY, PARTIAL  TONSILLECTOMY      There were no vitals filed for this visit.   Subjective Assessment - 11/28/21 0750     Subjective Patient reports moving more slowly today but denies any specific issues and denies any pain.    Patient is accompained by: Family member   [name]   Pertinent History Patient is a 76 year old male with referral from Neurology- Dr. Joselyn Arrow for unspecified abnormality of gait. He presents today with his wife and reports > 6 month history of progressive issues with poor balance and multiple falls. Patient has renal Cancer and currently has Pallative care- Was stabilized out of Hospice. Patient presents with the following past medical history: A-Fib and flutter, Lung Cancer, Obstructive sleep apnea, Upper respiratory infection, Addison's  disease, Hypothyroidism, Renal Cancer, DM, gout, arthritis, Chronic kidney disease and Restless leg syndrome. Patient lives with wife in independent living at [facility]. Prior level of function was independent with transfers and mobility using walker yet some assist with dressing.    Limitations Lifting;Standing;Walking;House hold activities    How long can you sit comfortably? No issues    How long can you stand comfortably? <5 min    How long can you walk comfortably? <5 min    Diagnostic tests EXAM:  MRI HEAD WITHOUT CONTRAST     TECHNIQUE:  Multiplanar, multiecho pulse sequences of the brain and surrounding  structures were obtained without intravenous contrast.     COMPARISON:  CT head without contrast 01/20/2021     FINDINGS:  Brain: Moderate generalized atrophy is present. 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 Ventricles are enlarged  bilaterally. There is mild ventricular asymmetry, the right is  larger.     No acute infarct, hemorrhage, or mass lesion is present. No other  significant extra-axial collection is present.     The internal auditory canals are within normal limits. The brainstem  and cerebellum are within normal limits.     Vascular: Flow is present in the major intracranial arteries.     Skull and upper cervical spine: The craniocervical junction is  normal. Upper cervical spine is within normal limits. Marrow signal  is unremarkable.     Sinuses/Orbits: The paranasal sinuses and mastoid air cells are  clear. Bilateral lens replacements are noted. Globes and orbits are  otherwise unremarkable.     IMPRESSION:  1. No acute intracranial abnormality.  2. Moderate generalized atrophy and white matter disease likely  reflects the sequela of chronic microvascular ischemia.  3. Arachnoid cyst over the left convexity.    Patient Stated Goals I would like to walk better and not fall    Currently in Pain? No/denies    Pain Onset --    Pain Onset --              INTERVENTIONS:   Neuromuscular re-education:    Dual task- Stepping over 1/2 foam x 2 - primary task then added holding a cup of water and walking in // bars while switching cup of water left to right.    Dual task- Primary- step taps without UE support x 10 each leg Added reading a book while tapping- significant decline in speed yet able to coordinate without LOB.    Dual task- Primary- staggered standing (front foot on 6" block and back foot on blue airex pad) while holding a deck of card- separating black and red and placing on tray table. - 10 cards with left foot  in back and 10 with right foot in back and repeat til completed dealing out and separating deck of cards- Much more difficulty with left foot behind.   Dual task - narrowed feet standing on blue airex pad while holding onto deck of cards- with 2 tray table set (1 on left and 1 on right) he was instructed to pull card and place numbered card on left tray table in order of 1-10 and face cards to right side. He did have some LOB posteriorly  reaching to left side but none to right.   Retro gait - counting backward by 3's - Significant difficulty with much increased time to complete 10 feet in // bars x 2 trials. Yet however, Patient did demonstrate improved step length even while counting today.   Kick the cone on right side - while walking in // bars focusing on increased Left LE step length and weight bearing. No LOB during this activity.   Education provided throughout session via VC/TC and demonstration to facilitate movement at target joints and correct muscle activation for all testing and exercises performed.                  PT Education - 11/28/21 0751     Education Details Exercise technique    Person(s) Educated Patient    Methods Explanation;Demonstration;Tactile cues;Verbal cues    Comprehension Verbalized understanding;Returned demonstration;Verbal cues required;Tactile cues required;Need  further instruction              PT Short Term Goals - 08/21/21 0814       PT SHORT TERM GOAL #1   Title Pt will be independent with HEP in order to improve strength and balance in order to decrease fall risk and improve function at home and work.    Baseline 07/21/2021- Paitent presents with no formal HEP in place. 08/21/2021- Patient reports compliance with HEP and states no questions as of today- able to perform standin using his rollator for support.    Time 6    Period Weeks    Status Achieved    Target Date 09/01/21               PT Long Term Goals - 11/10/21 1035       PT LONG TERM GOAL #1   Title Pt will improve FOTO to target score of 53  to display perceived improvements in ability to complete ADL's.    Baseline 07/21/2021= 45; 1/5=51. 10/12/2021- Just reassessed for progress note so did not reassess again- will continue with goal into new certification 1/61: 09%    Time 12    Period Weeks    Status On-going    Target Date 01/05/22      PT LONG TERM GOAL #2   Title Pt will improve BERG by at least 5 points in order to demonstrate clinically significant improvement in balance.    Baseline 07/21/2021= 39/56; 08/21/2021=46/56- Will keep goal active to ensure patient able to test consistently; 10/05/2021= 52/56    Time 12    Period Weeks    Status Achieved    Target Date 10/13/21      PT LONG TERM GOAL #3   Title Pt will decrease 5TSTS by at least 5 seconds in order to demonstrate clinically significant improvement in LE strength.    Baseline 07/21/2021= 21 sec without UE Support; 11/21= 14.0 sec without UE support-.Will Keep goal active to ensure consistency.  10/05/2021=14.75 sec without UE support  (improved from 21  sec and unchanged from last assessment of 14 sec)    Time 12    Period Weeks    Status Achieved    Target Date 10/13/21      PT LONG TERM GOAL #4   Title Pt will decrease TUG to below  18 seconds/decrease in order to demonstrate decreased fall  risk.    Baseline 07/21/2021= 23 sec with 4WW; 08/21/2021= 22.13 sec with 4WW; 10/05/2021= 19.51 sec avg with 4WW 2/10: 15.4 seconds with cane    Time 12    Period Weeks    Status Achieved    Target Date 01/05/22      PT LONG TERM GOAL #5   Title Pt will increase 10MWT by at least 0.23 m/s in order to demonstrate clinically significant improvement in community ambulation.    Baseline 07/21/2021= 0.45m/s; 08/21/2021= 0.57 m/s using 4WW; 10/05/2021= 0.6 m/s avg using 4WW 2/10: 0.51 m/s with cane    Time 12    Period Weeks    Status Partially Met    Target Date 01/05/22                   Plan - 11/28/21 0751     Clinical Impression Statement Patient presented with good motivation today and challenged well with dual task activities today. He was able to dual task well mostly with minimal LOB  with staggered standing (left LE in back) and with narrowed standing - twisting to left. He did improve with practice today without any significant fatigue. The patient will benefit from further skilled PT to improve balance, strength, gait and safety with ADLs.    Personal Factors and Comorbidities Comorbidity 3+    Comorbidities A- Fib, Renal cancer, Addison's disease, gout, arthritis, DM    Examination-Activity Limitations Caring for Others;Carry;Dressing;Lift;Reach Overhead;Squat;Stairs;Transfers;Stand    Examination-Participation Restrictions Investment banker, operational;Yard Work;Shop    Stability/Clinical Decision Making Evolving/Moderate complexity    Rehab Potential Good    PT Frequency 2x / week    PT Duration 12 weeks    PT Treatment/Interventions ADLs/Self Care Home Management;Canalith Repostioning;Cryotherapy;Electrical Stimulation;Moist Heat;Traction;Ultrasound;DME Instruction;Gait training;Stair training;Functional mobility training;Therapeutic activities;Therapeutic exercise;Balance training;Neuromuscular re-education;Patient/family education;Orthotic Fit/Training;Wheelchair  mobility training;Manual techniques;Passive range of motion;Dry needling;Energy conservation;Taping;Vestibular    PT Next Visit Plan Continue with progress LE strengthening; gait training for safe mobility, Neuromuscular re-ed for improved overall balance.  Continue plan of care as previously indicated    PT Home Exercise Plan 08/17/2021-Access Code: [email or web address]; no updates    Consulted and Agree with Plan of Care Patient;Family member/caregiver    Family Member Consulted WIfe- [name]             Patient will benefit from skilled therapeutic intervention in order to improve the following deficits and impairments:  Abnormal gait, Decreased activity tolerance, Decreased balance, Decreased coordination, Decreased endurance, Decreased knowledge of use of DME, Decreased mobility, Decreased strength, Difficulty walking, Hypomobility, Impaired sensation, Impaired vision/preception, Postural dysfunction, Pain  Visit Diagnosis: Abnormality of gait and mobility  Difficulty in walking, not elsewhere classified  Muscle weakness (generalized)  Unsteadiness on feet     Problem List Patient Active Problem List   Diagnosis Date Noted   Addisons disease ([place]) 03/16/2021   Low HDL (under 40) 03/16/2021   Difficulty sleeping 11/21/2020   Numbness and tingling 11/21/2020   Difficulty walking 01/14/2020   History of recent fall 01/14/2020   Sleep disorder 01/14/2020   Pancreatic mass 08/01/2018   H/O sebaceous cyst 01/06/2018   Tubular adenoma 10/28/2017   Chronic  gouty arthritis 03/07/2017   Edema leg 03/07/2017   Hx of atrial flutter 03/07/2017   OSA (obstructive sleep apnea) 07/24/2016   RLS (restless legs syndrome) 07/24/2016   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   Atrial fibrillation and flutter ([place]) 10/20/2014   Hyperlipidemia 06/16/2014   Adrenal crisis ([place]) 04/19/2014   Cough 03/29/2014   Weakness 03/29/2014   Cough syncope 10/15/2013   URI (upper  respiratory infection) 10/15/2013   Dyslipidemia 09/04/2013   Gout 09/04/2013   Chronic kidney disease, stage III (moderate) ([place]) 11/13/2011   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   Hypothyroidism (acquired) 11/30/2010   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[place]) 11/10/2010    Lewis Moccasin, PT 11/28/2021, 1:37 PM  [place] [facility] SERVICES [address] [place], [place], [address] Phone: [phone]   Fax:  [phone]  Name: Tony Huffman MRN: [id number] Date of Birth: 1945-10-03

## 2021-11-29 DEATH — deceased

## 2021-12-01 ENCOUNTER — Ambulatory Visit: Payer: Medicare Other | Attending: Neurology

## 2021-12-01 ENCOUNTER — Other Ambulatory Visit: Payer: Self-pay

## 2021-12-01 DIAGNOSIS — R278 Other lack of coordination: Secondary | ICD-10-CM | POA: Insufficient documentation

## 2021-12-01 DIAGNOSIS — R2689 Other abnormalities of gait and mobility: Secondary | ICD-10-CM | POA: Insufficient documentation

## 2021-12-01 DIAGNOSIS — R262 Difficulty in walking, not elsewhere classified: Secondary | ICD-10-CM

## 2021-12-01 DIAGNOSIS — M6281 Muscle weakness (generalized): Secondary | ICD-10-CM

## 2021-12-01 DIAGNOSIS — R269 Unspecified abnormalities of gait and mobility: Secondary | ICD-10-CM

## 2021-12-01 DIAGNOSIS — R2681 Unsteadiness on feet: Secondary | ICD-10-CM

## 2021-12-01 NOTE — Therapy (Signed)
[place] MAIN [facility] SERVICES [address] [place], [place], [address] Phone: [phone]   Fax:  [phone]  Physical Therapy Treatment  Patient Details  Name: Tony Huffman MRN: [id number] Date of Birth: 28-Dec-1945 Referring Provider (PT): Dr. Joselyn Arrow   Encounter Date: 12/01/2021   PT End of Session - 12/01/21 0916     Visit Number 36    Number of Visits 18    Date for PT Re-Evaluation 01/05/22    Authorization Type Medicare Part B; next session 1/10 PN 11/10/21    Authorization Time Period 07/21/2021- 10/13/2021; 10/13/2021-01/05/2022    Progress Note Due on Visit 31    PT Start Time 0910    PT Stop Time 0952    PT Time Calculation (min) 42 min    Equipment Utilized During Treatment Gait belt    Activity Tolerance Patient tolerated treatment well    Behavior During Therapy Tony Huffman for tasks assessed/performed             Past Medical History:  Diagnosis Date   Abnormal heart rhythm 10/20/2014   Addison's disease ([place])    Adrenal insufficiency ([place])    Allergy    Ambulates with cane    Anxiety    Arrhythmia    Cataract cortical, senile    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   Chronic kidney disease, stage 3 ([place]) 11/13/2011   Cough 03/29/2014   Edema leg 03/07/2017   Elevated cholesterol with high triglycerides 09/04/2013   Elevated red blood cell count    Erectile dysfunction    Falls frequently    Fever 03/29/2014   Generalized weakness 03/29/2014   GERD (gastroesophageal reflux disease)    Gout 09/04/2013   H/O adrenal insufficiency 05/17/2014   H/O atrial flutter 03/07/2017   H/O atrial flutter    H/O eye surgery    H/O sebaceous cyst 01/06/2018   H/O thyroid disease    H/O upper respiratory infection 10/15/2013   Hearing loss    History of back surgery    History of blepharoplasty    History of cancer 10/04/2015   History of chemotherapy    History of esophagogastroduodenoscopy (EGD)    History of prediabetes     Hyperlipidemia 06/16/2014   Hypertension    Hypertriglyceridemia    Hypothyroidism (acquired) 04/14/2014   Left leg swelling 11/23/2016   Low HDL (under 40)    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[place]) 11/10/2010   Malignant neoplasm of unspecified kidney, except renal pelvis (HCC)    OSA (obstructive sleep apnea) 07/24/2016   OSA (obstructive sleep apnea)    PAF (paroxysmal atrial fibrillation) ([facility])    PAF (paroxysmal atrial fibrillation) ([place])    Pancreatic mass 08/01/2018   Renal carcinoma ([place]) 06/16/2015   RLS (restless legs syndrome) 07/24/2016   Sleep apnea    Testicular hypofunction    Tubular adenoma 10/28/2017   Tussive syncopes 10/15/2013   Type 2 diabetes mellitus ([place])    Vitamin D deficiency disease     Past Surgical History:  Procedure Laterality Date   ADRENALECTOMY     CATARACT EXTRACTION  10/2014   CATARACT EXTRACTION EXTRACAPSULAR  11/05/2016   with Insertion Intraocular Prostheis.    COLONOSCOPY  05/06/2017   with removal lesions by snare.   COLONOSCOPY W/ BIOPSIES     05/06/2017   NEPHRECTOMY     POPLITEAL SYNOVIAL CYST EXCISION     SPINE SURGERY  1989   THYROIDECTOMY     THYROIDECTOMY, PARTIAL  TONSILLECTOMY      There were no vitals filed for this visit.   Subjective Assessment - 12/01/21 1119     Subjective [name] reports having an okay today with no new complaints. Utilized a transport chair this morning as wife reports he was moving a llittle slow.    Patient is accompained by: Family member   wife - Tony Huffman   Pertinent History Patient is a 76 year old male with referral from Neurology- Dr. Joselyn Arrow for unspecified abnormality of gait. He presents today with his wife and reports > 6 month history of progressive issues with poor balance and multiple falls. Patient has renal Cancer and currently has Pallative care- Was stabilized out of Hospice. Patient presents with the following past medical history: A-Fib and flutter, Lung Cancer,  Obstructive sleep apnea, Upper respiratory infection, Addison's disease, Hypothyroidism, Renal Cancer, DM, gout, arthritis, Chronic kidney disease and Restless leg syndrome. Patient lives with wife in independent living at [facility]. Prior level of function was independent with transfers and mobility using walker yet some assist with dressing.    Limitations Lifting;Standing;Walking;House hold activities    How long can you sit comfortably? No issues    How long can you stand comfortably? <5 min    How long can you walk comfortably? <5 min    Diagnostic tests EXAM:  MRI HEAD WITHOUT CONTRAST     TECHNIQUE:  Multiplanar, multiecho pulse sequences of the brain and surrounding  structures were obtained without intravenous contrast.     COMPARISON:  CT head without contrast 01/20/2021     FINDINGS:  Brain: Moderate generalized atrophy is present. 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 Ventricles are enlarged  bilaterally. There is mild ventricular asymmetry, the right is  larger.     No acute infarct, hemorrhage, or mass lesion is present. No other  significant extra-axial collection is present.     The internal auditory canals are within normal limits. The brainstem  and cerebellum are within normal limits.     Vascular: Flow is present in the major intracranial arteries.     Skull and upper cervical spine: The craniocervical junction is  normal. Upper cervical spine is within normal limits. Marrow signal  is unremarkable.     Sinuses/Orbits: The paranasal sinuses and mastoid air cells are  clear. Bilateral lens replacements are noted. Globes and orbits are  otherwise unremarkable.     IMPRESSION:  1. No acute intracranial abnormality.  2. Moderate generalized atrophy and white matter disease likely  reflects the sequela of chronic microvascular ischemia.  3. Arachnoid cyst over the left convexity.    Patient Stated Goals I would like to walk better and not fall    Currently in Pain? No/denies             INTERVENTIONS:   Neuromuscular re-ed:   Dynamic high knee march without UE support x 3 min Hangman game at support bar using dry erase board and magnet letters- feet narrowed. Patient rated as "medium" Hangman- feet staggered- Patient rated as hard- Switching feet several times during game Hangman- feet staggered - rated as hard with occasional UE touch due to fatigue and imbalance.   Cone activity- positioned 4 cones and patient performed walking from one cone to next involving- forward/backward side stepping.   Added 1 more cone in middle of square and patient then performed walking with cane- forward,forward diagonal, backward, backward diagonal, and sidestepping- Increased difficulty with backward stepping.  Therex:   Instruction in quadraped position-  - hip ext x 10 reps bilateral - Hip ER x 10 res Bilateral Good ability to maintain quadraped position and complete 1 set.   Education provided throughout session via VC/TC and demonstration to facilitate movement at target joints and correct muscle activation for all testing and exercises performed.                                PT Short Term Goals - 08/21/21 0814       PT SHORT TERM GOAL #1   Title Pt will be independent with HEP in order to improve strength and balance in order to decrease fall risk and improve function at home and work.    Baseline 07/21/2021- Paitent presents with no formal HEP in place. 08/21/2021- Patient reports compliance with HEP and states no questions as of today- able to perform standin using his rollator for support.    Time 6    Period Weeks    Status Achieved    Target Date 09/01/21               PT Long Term Goals - 11/10/21 1035       PT LONG TERM GOAL #1   Title Pt will improve FOTO to target score of 53  to display perceived improvements in ability to complete ADL's.    Baseline 07/21/2021= 45; 1/5=51. 10/12/2021- Just reassessed for  progress note so did not reassess again- will continue with goal into new certification 5/17: 61%    Time 12    Period Weeks    Status On-going    Target Date 01/05/22      PT LONG TERM GOAL #2   Title Pt will improve BERG by at least 5 points in order to demonstrate clinically significant improvement in balance.    Baseline 07/21/2021= 39/56; 08/21/2021=46/56- Will keep goal active to ensure patient able to test consistently; 10/05/2021= 52/56    Time 12    Period Weeks    Status Achieved    Target Date 10/13/21      PT LONG TERM GOAL #3   Title Pt will decrease 5TSTS by at least 5 seconds in order to demonstrate clinically significant improvement in LE strength.    Baseline 07/21/2021= 21 sec without UE Support; 11/21= 14.0 sec without UE support-.Will Keep goal active to ensure consistency.  10/05/2021=14.75 sec without UE support  (improved from 21 sec and unchanged from last assessment of 14 sec)    Time 12    Period Weeks    Status Achieved    Target Date 10/13/21      PT LONG TERM GOAL #4   Title Pt will decrease TUG to below  18 seconds/decrease in order to demonstrate decreased fall risk.    Baseline 07/21/2021= 23 sec with 4WW; 08/21/2021= 22.13 sec with 4WW; 10/05/2021= 19.51 sec avg with 4WW 2/10: 15.4 seconds with cane    Time 12    Period Weeks    Status Achieved    Target Date 01/05/22      PT LONG TERM GOAL #5   Title Pt will increase 10MWT by at least 0.23 m/s in order to demonstrate clinically significant improvement in community ambulation.    Baseline 07/21/2021= 0.35ms; 08/21/2021= 0.57 m/s using 4WW; 10/05/2021= 0.6 m/s avg using 4WW 2/10: 0.51 m/s with cane    Time 12    Period Weeks  Status Partially Met    Target Date 01/05/22                   Plan - 12/01/21 1120     Clinical Impression Statement Patient performed well with balance activities overall- did have a few episodes of LOB requiring UE support. He was able to complete cone activities  with use of cane and able to facilitate left LE better with specific short walks in/around cones. He was also able to attain quadraped position well today without difficulty. The patient will benefit from further skilled PT to improve balance, strength, gait and safety with ADLs.    Personal Factors and Comorbidities Comorbidity 3+    Comorbidities A- Fib, Renal cancer, Addison's disease, gout, arthritis, DM    Examination-Activity Limitations Caring for Others;Carry;Dressing;Lift;Reach Overhead;Squat;Stairs;Transfers;Stand    Examination-Participation Restrictions Investment banker, operational;Yard Work;Shop    Stability/Clinical Decision Making Evolving/Moderate complexity    Rehab Potential Good    PT Frequency 2x / week    PT Duration 12 weeks    PT Treatment/Interventions ADLs/Self Care Home Management;Canalith Repostioning;Cryotherapy;Electrical Stimulation;Moist Heat;Traction;Ultrasound;DME Instruction;Gait training;Stair training;Functional mobility training;Therapeutic activities;Therapeutic exercise;Balance training;Neuromuscular re-education;Patient/family education;Orthotic Fit/Training;Wheelchair mobility training;Manual techniques;Passive range of motion;Dry needling;Energy conservation;Taping;Vestibular    PT Next Visit Plan Continue with progress LE strengthening; gait training for safe mobility, Neuromuscular re-ed for improved overall balance.  Continue plan of care as previously indicated    PT Home Exercise Plan 08/17/2021-Access Code: [email or web address]; no updates    Consulted and Agree with Plan of Care Patient;Family member/caregiver    Family Member Consulted WIfe- Tony Huffman             Patient will benefit from skilled therapeutic intervention in order to improve the following deficits and impairments:  Abnormal gait, Decreased activity tolerance, Decreased balance, Decreased coordination, Decreased endurance, Decreased knowledge of use of DME, Decreased mobility, Decreased  strength, Difficulty walking, Hypomobility, Impaired sensation, Impaired vision/preception, Postural dysfunction, Pain  Visit Diagnosis: Abnormality of gait and mobility  Difficulty in walking, not elsewhere classified  Muscle weakness (generalized)  Unsteadiness on feet     Problem List Patient Active Problem List   Diagnosis Date Noted   Addisons disease ([place]) 03/16/2021   Low HDL (under 40) 03/16/2021   Difficulty sleeping 11/21/2020   Numbness and tingling 11/21/2020   Difficulty walking 01/14/2020   History of recent fall 01/14/2020   Sleep disorder 01/14/2020   Pancreatic mass 08/01/2018   H/O sebaceous cyst 01/06/2018   Tubular adenoma 10/28/2017   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   Edema leg 03/07/2017   Hx of atrial flutter 03/07/2017   OSA (obstructive sleep apnea) 07/24/2016   RLS (restless legs syndrome) 07/24/2016   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   Atrial fibrillation and flutter ([place]) 10/20/2014   Hyperlipidemia 06/16/2014   Adrenal crisis ([place]) 04/19/2014   Cough 03/29/2014   Weakness 03/29/2014   Cough syncope 10/15/2013   URI (upper respiratory infection) 10/15/2013   Dyslipidemia 09/04/2013   Gout 09/04/2013   Chronic kidney disease, stage III (moderate) ([place]) 11/13/2011   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   Hypothyroidism (acquired) 11/30/2010   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[place]) 11/10/2010    Lewis Moccasin, PT 12/01/2021, 11:27 AM  [place] MAIN [facility] SERVICES [address] [place], [place], [address] Phone: [phone]   Fax:  [phone]  Name: Tony Huffman MRN: [id number] Date of Birth: [birth year]

## 2021-12-04 ENCOUNTER — Ambulatory Visit: Payer: Medicare Other

## 2021-12-04 ENCOUNTER — Other Ambulatory Visit: Payer: Self-pay

## 2021-12-04 DIAGNOSIS — R2681 Unsteadiness on feet: Secondary | ICD-10-CM

## 2021-12-04 DIAGNOSIS — R278 Other lack of coordination: Secondary | ICD-10-CM

## 2021-12-04 DIAGNOSIS — R262 Difficulty in walking, not elsewhere classified: Secondary | ICD-10-CM

## 2021-12-04 DIAGNOSIS — R269 Unspecified abnormalities of gait and mobility: Secondary | ICD-10-CM

## 2021-12-04 DIAGNOSIS — M6281 Muscle weakness (generalized): Secondary | ICD-10-CM

## 2021-12-04 NOTE — Therapy (Signed)
[place] MAIN [facility] SERVICES [address] [place], [place], [address] Phone: [phone]   Fax:  [phone]  Physical Therapy Treatment  Patient Details  Name: Tony Huffman MRN: [id number] Date of Birth: 1946/03/08 Referring Provider (PT): Dr. Joselyn Arrow   Encounter Date: 12/04/2021   PT End of Session - 12/04/21 1632     Visit Number 37    Number of Visits 106    Date for PT Re-Evaluation 01/05/22    Authorization Type Medicare Part B; next session 1/10 PN 11/10/21    Authorization Time Period 07/21/2021- 10/13/2021; 10/13/2021-01/05/2022    Progress Note Due on Visit 71    PT Start Time 0845    PT Stop Time 0928    PT Time Calculation (min) 43 min    Equipment Utilized During Treatment Gait belt    Activity Tolerance Patient tolerated treatment well    Behavior During Therapy [facility] for tasks assessed/performed             Past Medical History:  Diagnosis Date   Abnormal heart rhythm 10/20/2014   Addison's disease ([place])    Adrenal insufficiency ([place])    Allergy    Ambulates with cane    Anxiety    Arrhythmia    Cataract cortical, senile    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   Chronic kidney disease, stage 3 ([place]) 11/13/2011   Cough 03/29/2014   Edema leg 03/07/2017   Elevated cholesterol with high triglycerides 09/04/2013   Elevated red blood cell count    Erectile dysfunction    Falls frequently    Fever 03/29/2014   Generalized weakness 03/29/2014   GERD (gastroesophageal reflux disease)    Gout 09/04/2013   H/O adrenal insufficiency 05/17/2014   H/O atrial flutter 03/07/2017   H/O atrial flutter    H/O eye surgery    H/O sebaceous cyst 01/06/2018   H/O thyroid disease    H/O upper respiratory infection 10/15/2013   Hearing loss    History of back surgery    History of blepharoplasty    History of cancer 10/04/2015   History of chemotherapy    History of esophagogastroduodenoscopy (EGD)    History of prediabetes     Hyperlipidemia 06/16/2014   Hypertension    Hypertriglyceridemia    Hypothyroidism (acquired) 04/14/2014   Left leg swelling 11/23/2016   Low HDL (under 40)    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[place]) 11/10/2010   Malignant neoplasm of unspecified kidney, except renal pelvis (HCC)    OSA (obstructive sleep apnea) 07/24/2016   OSA (obstructive sleep apnea)    PAF (paroxysmal atrial fibrillation) (HCC)    PAF (paroxysmal atrial fibrillation) ([place])    Pancreatic mass 08/01/2018   Renal carcinoma ([place]) 06/16/2015   RLS (restless legs syndrome) 07/24/2016   Sleep apnea    Testicular hypofunction    Tubular adenoma 10/28/2017   Tussive syncopes 10/15/2013   Type 2 diabetes mellitus ([place])    Vitamin D deficiency disease     Past Surgical History:  Procedure Laterality Date   ADRENALECTOMY     CATARACT EXTRACTION  10/2014   CATARACT EXTRACTION EXTRACAPSULAR  11/05/2016   with Insertion Intraocular Prostheis.    COLONOSCOPY  05/06/2017   with removal lesions by snare.   COLONOSCOPY W/ BIOPSIES     05/06/2017   NEPHRECTOMY     POPLITEAL SYNOVIAL CYST EXCISION     SPINE SURGERY  1989   THYROIDECTOMY     THYROIDECTOMY, PARTIAL  TONSILLECTOMY      There were no vitals filed for this visit.   Subjective Assessment - 12/04/21 1630     Subjective Patient reports doing well today with no complaints. Denies any falls and no pain today.    Patient is accompained by: Family member   [name]   Pertinent History Patient is a 76 year old male with referral from Neurology- Dr. Joselyn Arrow for unspecified abnormality of gait. He presents today with his wife and reports > 6 month history of progressive issues with poor balance and multiple falls. Patient has renal Cancer and currently has Pallative care- Was stabilized out of Hospice. Patient presents with the following past medical history: A-Fib and flutter, Lung Cancer, Obstructive sleep apnea, Upper respiratory infection, Addison's  disease, Hypothyroidism, Renal Cancer, DM, gout, arthritis, Chronic kidney disease and Restless leg syndrome. Patient lives with wife in independent living at [facility]. Prior level of function was independent with transfers and mobility using walker yet some assist with dressing.    Limitations Lifting;Standing;Walking;House hold activities    How long can you sit comfortably? No issues    How long can you stand comfortably? <5 min    How long can you walk comfortably? <5 min    Diagnostic tests EXAM:  MRI HEAD WITHOUT CONTRAST     TECHNIQUE:  Multiplanar, multiecho pulse sequences of the brain and surrounding  structures were obtained without intravenous contrast.     COMPARISON:  CT head without contrast 01/20/2021     FINDINGS:  Brain: Moderate generalized atrophy is present. 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 Ventricles are enlarged  bilaterally. There is mild ventricular asymmetry, the right is  larger.     No acute infarct, hemorrhage, or mass lesion is present. No other  significant extra-axial collection is present.     The internal auditory canals are within normal limits. The brainstem  and cerebellum are within normal limits.     Vascular: Flow is present in the major intracranial arteries.     Skull and upper cervical spine: The craniocervical junction is  normal. Upper cervical spine is within normal limits. Marrow signal  is unremarkable.     Sinuses/Orbits: The paranasal sinuses and mastoid air cells are  clear. Bilateral lens replacements are noted. Globes and orbits are  otherwise unremarkable.     IMPRESSION:  1. No acute intracranial abnormality.  2. Moderate generalized atrophy and white matter disease likely  reflects the sequela of chronic microvascular ischemia.  3. Arachnoid cyst over the left convexity.    Patient Stated Goals I would like to walk better and not fall    Currently in Pain? No/denies            INTERVENTIONS:   Neuromuscular re-ed:    Step tap without UE support - seated onto top of cone x 12 reps BLE  Seated hip flex/abd up and over cone 2 sets  x 15 reps BLE   Standing -side step using 3lb AW x length of // bars x 5 trials.   Treadmill activity: placing right foot on side of Treadmill while using left LE only on TM at a pace of:   1st trial- 0.5 mph x 2 min 2nd trial at 0.4-0.7 mph x 2 min 23 sec 3rd trial at 0.5 mph using both LE on TM x 3 min 4th trail at 0.5 mph using both LE on TM x 1 min 46 sec *Patient was  limited by left LE weakness requiring several verbal/visual cues for left foot advancement/placement. Patient required brief rest breaks.   .                            PT Education - 12/04/21 1631     Education Details Treadmill safety techniques    Person(s) Educated Patient    Methods Explanation;Demonstration;Tactile cues;Verbal cues    Comprehension Verbalized understanding;Returned demonstration;Verbal cues required;Tactile cues required;Need further instruction              PT Short Term Goals - 08/21/21 0814       PT SHORT TERM GOAL #1   Title Pt will be independent with HEP in order to improve strength and balance in order to decrease fall risk and improve function at home and work.    Baseline 07/21/2021- Paitent presents with no formal HEP in place. 08/21/2021- Patient reports compliance with HEP and states no questions as of today- able to perform standin using his rollator for support.    Time 6    Period Weeks    Status Achieved    Target Date 09/01/21               PT Long Term Goals - 11/10/21 1035       PT LONG TERM GOAL #1   Title Pt will improve FOTO to target score of 53  to display perceived improvements in ability to complete ADL's.    Baseline 07/21/2021= 45; 1/5=51. 10/12/2021- Just reassessed for progress note so did not reassess again- will continue with goal into new certification 0/62: 37%    Time 12    Period Weeks    Status  On-going    Target Date 01/05/22      PT LONG TERM GOAL #2   Title Pt will improve BERG by at least 5 points in order to demonstrate clinically significant improvement in balance.    Baseline 07/21/2021= 39/56; 08/21/2021=46/56- Will keep goal active to ensure patient able to test consistently; 10/05/2021= 52/56    Time 12    Period Weeks    Status Achieved    Target Date 10/13/21      PT LONG TERM GOAL #3   Title Pt will decrease 5TSTS by at least 5 seconds in order to demonstrate clinically significant improvement in LE strength.    Baseline 07/21/2021= 21 sec without UE Support; 11/21= 14.0 sec without UE support-.Will Keep goal active to ensure consistency.  10/05/2021=14.75 sec without UE support  (improved from 21 sec and unchanged from last assessment of 14 sec)    Time 12    Period Weeks    Status Achieved    Target Date 10/13/21      PT LONG TERM GOAL #4   Title Pt will decrease TUG to below  18 seconds/decrease in order to demonstrate decreased fall risk.    Baseline 07/21/2021= 23 sec with 4WW; 08/21/2021= 22.13 sec with 4WW; 10/05/2021= 19.51 sec avg with 4WW 2/10: 15.4 seconds with cane    Time 12    Period Weeks    Status Achieved    Target Date 01/05/22      PT LONG TERM GOAL #5   Title Pt will increase 10MWT by at least 0.23 m/s in order to demonstrate clinically significant improvement in community ambulation.    Baseline 07/21/2021= 0.28ms; 08/21/2021= 0.57 m/s using 4WW; 10/05/2021= 0.6 m/s avg using 4WW 2/10: 0.51 m/s  with cane    Time 12    Period Weeks    Status Partially Met    Target Date 01/05/22                   Plan - 12/04/21 1632     Clinical Impression Statement Patient performed well with side step and step tap so progressed to trying to focus on advancing Left LE when fatigued by using a treadmill today. Patient was able to use the verbal and visual cues to walk over 2 min several times but ultimately limited by fatigue during each session on  treadmill. The patient will benefit from further skilled PT to improve balance, strength, gait and safety with ADLs    Personal Factors and Comorbidities Comorbidity 3+    Comorbidities A- Fib, Renal cancer, Addison's disease, gout, arthritis, DM    Examination-Activity Limitations Caring for Others;Carry;Dressing;Lift;Reach Overhead;Squat;Stairs;Transfers;Stand    Examination-Participation Restrictions Community Activity;Cleaning;Driving;Yard Work;Shop    Stability/Clinical Decision Making Evolving/Moderate complexity    Rehab Potential Good    PT Frequency 2x / week    PT Duration 12 weeks    PT Treatment/Interventions ADLs/Self Care Home Management;Canalith Repostioning;Cryotherapy;Electrical Stimulation;Moist Heat;Traction;Ultrasound;DME Instruction;Gait training;Stair training;Functional mobility training;Therapeutic activities;Therapeutic exercise;Balance training;Neuromuscular re-education;Patient/family education;Orthotic Fit/Training;Wheelchair mobility training;Manual techniques;Passive range of motion;Dry needling;Energy conservation;Taping;Vestibular    PT Next Visit Plan Continue with progress LE strengthening; gait training for safe mobility, Neuromuscular re-ed for improved overall balance.  Continue plan of care as previously indicated    PT Home Exercise Plan 08/17/2021-Access Code: [email or web address]; no updates    Consulted and Agree with Plan of Care Patient;Family member/caregiver    Family Member Consulted WIfe- [name]             Patient will benefit from skilled therapeutic intervention in order to improve the following deficits and impairments:  Abnormal gait, Decreased activity tolerance, Decreased balance, Decreased coordination, Decreased endurance, Decreased knowledge of use of DME, Decreased mobility, Decreased strength, Difficulty walking, Hypomobility, Impaired sensation, Impaired vision/preception, Postural dysfunction, Pain  Visit Diagnosis: Abnormality of gait and  mobility  Difficulty in walking, not elsewhere classified  Muscle weakness (generalized)  Other lack of coordination  Unsteadiness on feet     Problem List Patient Active Problem List   Diagnosis Date Noted   Addisons disease ([place]) 03/16/2021   Low HDL (under 40) 03/16/2021   Difficulty sleeping 11/21/2020   Numbness and tingling 11/21/2020   Difficulty walking 01/14/2020   History of recent fall 01/14/2020   Sleep disorder 01/14/2020   Pancreatic mass 08/01/2018   H/O sebaceous cyst 01/06/2018   Tubular adenoma 10/28/2017   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   Edema leg 03/07/2017   Hx of atrial flutter 03/07/2017   OSA (obstructive sleep apnea) 07/24/2016   RLS (restless legs syndrome) 07/24/2016   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 [place], [place], [address] Phone: [phone]   Fax:  [phone]  Name:  Tony Huffman MRN: [id number] Date of Birth: 1945-10-26

## 2021-12-08 ENCOUNTER — Ambulatory Visit: Payer: Medicare Other

## 2021-12-08 ENCOUNTER — Other Ambulatory Visit: Payer: Self-pay

## 2021-12-08 DIAGNOSIS — M6281 Muscle weakness (generalized): Secondary | ICD-10-CM

## 2021-12-08 DIAGNOSIS — R269 Unspecified abnormalities of gait and mobility: Secondary | ICD-10-CM

## 2021-12-08 DIAGNOSIS — R262 Difficulty in walking, not elsewhere classified: Secondary | ICD-10-CM

## 2021-12-08 DIAGNOSIS — R2681 Unsteadiness on feet: Secondary | ICD-10-CM

## 2021-12-08 NOTE — Therapy (Signed)
[place] MAIN [facility] SERVICES [address] [place], [place], [address] Phone: [phone]   Fax:  [phone]  Physical Therapy Treatment  Patient Details  Name: Tony Huffman MRN: [id number] Date of Birth: August 20, 1946 Referring Provider (PT): Dr. Joselyn Arrow   Encounter Date: 12/08/2021   PT End of Session - 12/08/21 0857     Visit Number 60    Number of Visits 21    Date for PT Re-Evaluation 01/05/22    Authorization Type Medicare Part B; next session 1/10 PN 11/10/21    Authorization Time Period 07/21/2021- 10/13/2021; 10/13/2021-01/05/2022    Progress Note Due on Visit 88    PT Start Time 0851    PT Stop Time 0935    PT Time Calculation (min) 44 min    Equipment Utilized During Treatment Gait belt    Activity Tolerance Patient tolerated treatment well    Behavior During Therapy [facility] for tasks assessed/performed             Past Medical History:  Diagnosis Date   Abnormal heart rhythm 10/20/2014   Addison's disease ([place])    Adrenal insufficiency ([place])    Allergy    Ambulates with cane    Anxiety    Arrhythmia    Cataract cortical, senile    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   Chronic kidney disease, stage 3 ([place]) 11/13/2011   Cough 03/29/2014   Edema leg 03/07/2017   Elevated cholesterol with high triglycerides 09/04/2013   Elevated red blood cell count    Erectile dysfunction    Falls frequently    Fever 03/29/2014   Generalized weakness 03/29/2014   GERD (gastroesophageal reflux disease)    Gout 09/04/2013   H/O adrenal insufficiency 05/17/2014   H/O atrial flutter 03/07/2017   H/O atrial flutter    H/O eye surgery    H/O sebaceous cyst 01/06/2018   H/O thyroid disease    H/O upper respiratory infection 10/15/2013   Hearing loss    History of back surgery    History of blepharoplasty    History of cancer 10/04/2015   History of chemotherapy    History of esophagogastroduodenoscopy (EGD)    History of prediabetes     Hyperlipidemia 06/16/2014   Hypertension    Hypertriglyceridemia    Hypothyroidism (acquired) 04/14/2014   Left leg swelling 11/23/2016   Low HDL (under 40)    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[place]) 11/10/2010   Malignant neoplasm of unspecified kidney, except renal pelvis (HCC)    OSA (obstructive sleep apnea) 07/24/2016   OSA (obstructive sleep apnea)    PAF (paroxysmal atrial fibrillation) (HCC)    PAF (paroxysmal atrial fibrillation) ([place])    Pancreatic mass 08/01/2018   Renal carcinoma ([place]) 06/16/2015   RLS (restless legs syndrome) 07/24/2016   Sleep apnea    Testicular hypofunction    Tubular adenoma 10/28/2017   Tussive syncopes 10/15/2013   Type 2 diabetes mellitus ([place])    Vitamin D deficiency disease     Past Surgical History:  Procedure Laterality Date   ADRENALECTOMY     CATARACT EXTRACTION  10/2014   CATARACT EXTRACTION EXTRACAPSULAR  11/05/2016   with Insertion Intraocular Prostheis.    COLONOSCOPY  05/06/2017   with removal lesions by snare.   COLONOSCOPY W/ BIOPSIES     05/06/2017   NEPHRECTOMY     POPLITEAL SYNOVIAL CYST EXCISION     SPINE SURGERY  1989   THYROIDECTOMY     THYROIDECTOMY, PARTIAL  TONSILLECTOMY      There were no vitals filed for this visit.   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    Patient is accompained by: Family member   [name]   Pertinent History Patient is a 76 year old male with referral from Neurology- Dr. Joselyn Arrow for unspecified abnormality of gait. He presents today with his wife and reports > 6 month history of progressive issues with poor balance and multiple falls. Patient has renal Cancer and currently has Pallative care- Was stabilized out of Hospice. Patient presents with the following past medical history: A-Fib and flutter, Lung Cancer,  Obstructive sleep apnea, Upper respiratory infection, Addison's disease, Hypothyroidism, Renal Cancer, DM, gout, arthritis, Chronic kidney disease and Restless leg syndrome. Patient lives with wife in independent living at [facility]. Prior level of function was independent with transfers and mobility using walker yet some assist with dressing.    Limitations Lifting;Standing;Walking;House hold activities    How long can you sit comfortably? No issues    How long can you stand comfortably? <5 min    How long can you walk comfortably? <5 min    Diagnostic tests EXAM:  MRI HEAD WITHOUT CONTRAST     TECHNIQUE:  Multiplanar, multiecho pulse sequences of the brain and surrounding  structures were obtained without intravenous contrast.     COMPARISON:  CT head without contrast 01/20/2021     FINDINGS:  Brain: Moderate generalized atrophy is present. 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 Ventricles are enlarged  bilaterally. There is mild ventricular asymmetry, the right is  larger.     No acute infarct, hemorrhage, or mass lesion is present. No other  significant extra-axial collection is present.     The internal auditory canals are within normal limits. The brainstem  and cerebellum are within normal limits.     Vascular: Flow is present in the major intracranial arteries.     Skull and upper cervical spine: The craniocervical junction is  normal. Upper cervical spine is within normal limits. Marrow signal  is unremarkable.     Sinuses/Orbits: The paranasal sinuses and mastoid air cells are  clear. Bilateral lens replacements are noted. Globes and orbits are  otherwise unremarkable.     IMPRESSION:  1. No acute intracranial abnormality.  2. Moderate generalized atrophy and white matter disease likely  reflects the sequela of chronic microvascular ischemia.  3. Arachnoid cyst over the left convexity.    Patient Stated Goals I would like to walk better and not fall    Currently in Pain? No/denies              INTERVENTIONS:   Neuromuscular re-ed  Dynamic fast march standing on blue airex pad with BUE support x 20 reps.   Dynamic slow march with 1 UE support on airex pad x 20 reps  Dynamic Slow high knee march without UE support on firm support x 20 reps.    10 MWT= 15.22 sec (0.61 m/s)  fast and 17.2 sec (0.58 m/s) normal using SPC.  10 MWT= 17.2 sec and 19.07 sec   Therex:  Sit to stand - staggered feet with left LE in front x 6 reps Staggered feet with right LE in front x 6 reps  Toe raises - seated Blue theraband 2  sets of 12 reps.   Education provided throughout session via VC/TC and demonstration to facilitate movement at target joints and correct muscle activation for all testing and exercises performed.                           PT Education - 12/08/21 0951     Person(s) Educated Patient    Methods Explanation;Demonstration;Tactile cues    Comprehension Verbalized understanding;Returned demonstration;Verbal cues required;Tactile cues required;Need further instruction              PT Short Term Goals - 08/21/21 0814       PT SHORT TERM GOAL #1   Title Pt will be independent with HEP in order to improve strength and balance in order to decrease fall risk and improve function at home and work.    Baseline 07/21/2021- Paitent presents with no formal HEP in place. 08/21/2021- Patient reports compliance with HEP and states no questions as of today- able to perform standin using his rollator for support.    Time 6    Period Weeks    Status Achieved    Target Date 09/01/21               PT Long Term Goals - 11/10/21 1035       PT LONG TERM GOAL #1   Title Pt will improve FOTO to target score of 53  to display perceived improvements in ability to complete ADL's.    Baseline 07/21/2021= 45; 1/5=51. 10/12/2021- Just reassessed for progress note so did not reassess again- will continue with goal into new certification 6/41: 58%     Time 12    Period Weeks    Status On-going    Target Date 01/05/22      PT LONG TERM GOAL #2   Title Pt will improve BERG by at least 5 points in order to demonstrate clinically significant improvement in balance.    Baseline 07/21/2021= 39/56; 08/21/2021=46/56- Will keep goal active to ensure patient able to test consistently; 10/05/2021= 52/56    Time 12    Period Weeks    Status Achieved    Target Date 10/13/21      PT LONG TERM GOAL #3   Title Pt will decrease 5TSTS by at least 5 seconds in order to demonstrate clinically significant improvement in LE strength.    Baseline 07/21/2021= 21 sec without UE Support; 11/21= 14.0 sec without UE support-.Will Keep goal active to ensure consistency.  10/05/2021=14.75 sec without UE support  (improved from 21 sec and unchanged from last assessment of 14 sec)    Time 12    Period Weeks    Status Achieved    Target Date 10/13/21      PT LONG TERM GOAL #4   Title Pt will decrease TUG to below  18 seconds/decrease in order to demonstrate decreased fall risk.    Baseline 07/21/2021= 23 sec with 4WW; 08/21/2021= 22.13 sec with 4WW; 10/05/2021= 19.51 sec avg with 4WW 2/10: 15.4 seconds with cane    Time 12    Period Weeks    Status Achieved    Target Date 01/05/22      PT LONG TERM GOAL #5   Title Pt will increase 10MWT by at least 0.23 m/s in order to demonstrate clinically significant improvement in community ambulation.    Baseline 07/21/2021= 0.75m/s; 08/21/2021= 0.57 m/s using 4WW; 10/05/2021= 0.6 m/s avg using 4WW 2/10: 0.51 m/s with  cane    Time 12    Period Weeks    Status Partially Met    Target Date 01/05/22                   Plan - 12/08/21 0948     Clinical Impression Statement Patient presents with good motivation for today's session. He participated well and able to demo improved overall gait speed with 10 MWT with only VC for step length. He was able to stand after several attempts with right LE compromised to focus on  Left LE power. He was very limited due to fatigue at end of session. The patient will benefit from further skilled PT to improve balance, strength, gait and safety with ADLs    Personal Factors and Comorbidities Comorbidity 3+    Comorbidities A- Fib, Renal cancer, Addison's disease, gout, arthritis, DM    Examination-Activity Limitations Caring for Others;Carry;Dressing;Lift;Reach Overhead;Squat;Stairs;Transfers;Stand    Examination-Participation Restrictions Community Activity;Cleaning;Driving;Yard Work;Shop    Stability/Clinical Decision Making Evolving/Moderate complexity    Rehab Potential Good    PT Frequency 2x / week    PT Duration 12 weeks    PT Treatment/Interventions ADLs/Self Care Home Management;Canalith Repostioning;Cryotherapy;Electrical Stimulation;Moist Heat;Traction;Ultrasound;DME Instruction;Gait training;Stair training;Functional mobility training;Therapeutic activities;Therapeutic exercise;Balance training;Neuromuscular re-education;Patient/family education;Orthotic Fit/Training;Wheelchair mobility training;Manual techniques;Passive range of motion;Dry needling;Energy conservation;Taping;Vestibular    PT Next Visit Plan Continue with progress LE strengthening; gait training for safe mobility, Neuromuscular re-ed for improved overall balance.  Continue plan of care as previously indicated    PT Home Exercise Plan 08/17/2021-Access Code: [email or web address]; no updates    Consulted and Agree with Plan of Care Patient;Family member/caregiver    Family Member Consulted WIfe- [name]             Patient will benefit from skilled therapeutic intervention in order to improve the following deficits and impairments:  Abnormal gait, Decreased activity tolerance, Decreased balance, Decreased coordination, Decreased endurance, Decreased knowledge of use of DME, Decreased mobility, Decreased strength, Difficulty walking, Hypomobility, Impaired sensation, Impaired vision/preception, Postural  dysfunction, Pain  Visit Diagnosis: Abnormality of gait and mobility  Difficulty in walking, not elsewhere classified  Muscle weakness (generalized)  Unsteadiness on feet     Problem List Patient Active Problem List   Diagnosis Date Noted   Addisons disease ([place]) 03/16/2021   Low HDL (under 40) 03/16/2021   Difficulty sleeping 11/21/2020   Numbness and tingling 11/21/2020   Difficulty walking 01/14/2020   History of recent fall 01/14/2020   Sleep disorder 01/14/2020   Pancreatic mass 08/01/2018   H/O sebaceous cyst 01/06/2018   Tubular adenoma 10/28/2017   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   Edema leg 03/07/2017   Hx of atrial flutter 03/07/2017   OSA (obstructive sleep apnea) 07/24/2016   RLS (restless legs syndrome) 07/24/2016   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 [place], [place], [address] Phone: [phone]   Fax:  [phone]  Name: Tony Huffman  MRN: [id number] Date of Birth: 20-Dec-1945

## 2021-12-11 ENCOUNTER — Other Ambulatory Visit: Payer: Self-pay

## 2021-12-11 ENCOUNTER — Ambulatory Visit: Payer: Medicare Other

## 2021-12-11 DIAGNOSIS — R262 Difficulty in walking, not elsewhere classified: Secondary | ICD-10-CM

## 2021-12-11 DIAGNOSIS — R269 Unspecified abnormalities of gait and mobility: Secondary | ICD-10-CM

## 2021-12-11 DIAGNOSIS — M6281 Muscle weakness (generalized): Secondary | ICD-10-CM

## 2021-12-11 DIAGNOSIS — R2681 Unsteadiness on feet: Secondary | ICD-10-CM

## 2021-12-11 DIAGNOSIS — R278 Other lack of coordination: Secondary | ICD-10-CM

## 2021-12-11 NOTE — Therapy (Signed)
[place] MAIN [facility] SERVICES [address] [place], [place], [address] Phone: [phone]   Fax:  [phone]  Physical Therapy Treatment  Patient Details  Name: Tony Huffman MRN: [id number] Date of Birth: 14-Dec-1945 Referring Provider (PT): Tony Huffman   Encounter Date: 12/11/2021   PT End of Session - 12/11/21 0852     Visit Number 53    Number of Visits 84    Date for PT Re-Evaluation 01/05/22    Authorization Type Medicare Part B; next session 1/10 PN 11/10/21    Authorization Time Period 07/21/2021- 10/13/2021; 10/13/2021-01/05/2022    Progress Note Due on Visit 72    PT Start Time 0847    PT Stop Time 0929    PT Time Calculation (min) 42 min    Equipment Utilized During Treatment Gait belt    Activity Tolerance Patient tolerated treatment well    Behavior During Therapy [facility] for tasks assessed/performed             Past Medical History:  Diagnosis Date   Abnormal heart rhythm 10/20/2014   Addison's disease ([place])    Adrenal insufficiency ([place])    Allergy    Ambulates with cane    Anxiety    Arrhythmia    Cataract cortical, senile    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   Chronic kidney disease, stage 3 ([place]) 11/13/2011   Cough 03/29/2014   Edema leg 03/07/2017   Elevated cholesterol with high triglycerides 09/04/2013   Elevated red blood cell count    Erectile dysfunction    Falls frequently    Fever 03/29/2014   Generalized weakness 03/29/2014   GERD (gastroesophageal reflux disease)    Gout 09/04/2013   H/O adrenal insufficiency 05/17/2014   H/O atrial flutter 03/07/2017   H/O atrial flutter    H/O eye surgery    H/O sebaceous cyst 01/06/2018   H/O thyroid disease    H/O upper respiratory infection 10/15/2013   Hearing loss    History of back surgery    History of blepharoplasty    History of cancer 10/04/2015   History of chemotherapy    History of esophagogastroduodenoscopy (EGD)    History of prediabetes     Hyperlipidemia 06/16/2014   Hypertension    Hypertriglyceridemia    Hypothyroidism (acquired) 04/14/2014   Left leg swelling 11/23/2016   Low HDL (under 40)    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[place]) 11/10/2010   Malignant neoplasm of unspecified kidney, except renal pelvis (HCC)    OSA (obstructive sleep apnea) 07/24/2016   OSA (obstructive sleep apnea)    PAF (paroxysmal atrial fibrillation) (HCC)    PAF (paroxysmal atrial fibrillation) ([place])    Pancreatic mass 08/01/2018   Renal carcinoma ([place]) 06/16/2015   RLS (restless legs syndrome) 07/24/2016   Sleep apnea    Testicular hypofunction    Tubular adenoma 10/28/2017   Tussive syncopes 10/15/2013   Type 2 diabetes mellitus ([place])    Vitamin D deficiency disease     Past Surgical History:  Procedure Laterality Date   ADRENALECTOMY     CATARACT EXTRACTION  10/2014   CATARACT EXTRACTION EXTRACAPSULAR  11/05/2016   with Insertion Intraocular Prostheis.    COLONOSCOPY  05/06/2017   with removal lesions by snare.   COLONOSCOPY W/ BIOPSIES     05/06/2017   NEPHRECTOMY     POPLITEAL SYNOVIAL CYST EXCISION     SPINE SURGERY  1989   THYROIDECTOMY     THYROIDECTOMY, PARTIAL  TONSILLECTOMY      There were no vitals filed for this visit.   Subjective Assessment - 12/11/21 0850     Subjective Patient reports having more issues over the weekend- reports feeling weaker and people were asking him over the weekend if he needed help    Patient is accompained by: Family member   wife - Tony Huffman   Pertinent History Patient is a 76 year old male with referral from Neurology- Tony Huffman for unspecified abnormality of gait. He presents today with his wife and reports > 6 month history of progressive issues with poor balance and multiple falls. Patient has renal Cancer and currently has Pallative care- Was stabilized out of Hospice. Patient presents with the following past medical history: A-Fib and flutter, Lung Cancer, Obstructive sleep  apnea, Upper respiratory infection, Addison's disease, Hypothyroidism, Renal Cancer, DM, gout, arthritis, Chronic kidney disease and Restless leg syndrome. Patient lives with wife in independent living at [facility]. Prior level of function was independent with transfers and mobility using walker yet some assist with dressing.    Limitations Lifting;Standing;Walking;House hold activities    How long can you sit comfortably? No issues    How long can you stand comfortably? <5 min    How long can you walk comfortably? <5 min    Diagnostic tests EXAM:  MRI HEAD WITHOUT CONTRAST     TECHNIQUE:  Multiplanar, multiecho pulse sequences of the brain and surrounding  structures were obtained without intravenous contrast.     COMPARISON:  CT head without contrast 01/20/2021     FINDINGS:  Brain: Moderate generalized atrophy is present. 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 Ventricles are enlarged  bilaterally. There is mild ventricular asymmetry, the right is  larger.     No acute infarct, hemorrhage, or mass lesion is present. No other  significant extra-axial collection is present.     The internal auditory canals are within normal limits. The brainstem  and cerebellum are within normal limits.     Vascular: Flow is present in the major intracranial arteries.     Skull and upper cervical spine: The craniocervical junction is  normal. Upper cervical spine is within normal limits. Marrow signal  is unremarkable.     Sinuses/Orbits: The paranasal sinuses and mastoid air cells are  clear. Bilateral lens replacements are noted. Globes and orbits are  otherwise unremarkable.     IMPRESSION:  1. No acute intracranial abnormality.  2. Moderate generalized atrophy and white matter disease likely  reflects the sequela of chronic microvascular ischemia.  3. Arachnoid cyst over the left convexity.    Patient Stated Goals I would like to walk better and not fall    Currently in Pain? No/denies              INTERVENTIONS:   Gait training:  Gait on TM Biodex at 0.5 mph at 1 min 18 sec at 0.01 mi- Stopped secondary to patient began dragging Left LE and unable to keep up with Treadmill.  Gait on TM biodex at 0.4 mph at 1 min 26 sec at 0.01 mi Gait on TM Biodex at 0.4- 0.5 mph at 2 min 25 sec at 0.02 mi   Patient ambulated around 196 feet with SPC untimed with VC for cadence and a few stoppages due to patient would drag his left LE signifcantly.   Patient ambulated another 200 feet with SPC in 2 min 28 sec with VC for cadence and a  few stops to regroup once patient began dragging his left LE.   Heel to toe gait sequencing activity- Patient was standing and started with left LE back in toe off position then instructed to swing leg forward into heel strike position x 25 reps. Patient then performed 25 reps on right side.                           PT Education - 12/11/21 0852     Education Details Exercise technique    Person(s) Educated Patient    Methods Explanation;Demonstration;Tactile cues;Verbal cues    Comprehension Verbalized understanding;Returned demonstration;Verbal cues required;Tactile cues required;Need further instruction              PT Short Term Goals - 08/21/21 0814       PT SHORT TERM GOAL #1   Title Pt will be independent with HEP in order to improve strength and balance in order to decrease fall risk and improve function at home and work.    Baseline 07/21/2021- Paitent presents with no formal HEP in place. 08/21/2021- Patient reports compliance with HEP and states no questions as of today- able to perform standin using his rollator for support.    Time 6    Period Weeks    Status Achieved    Target Date 09/01/21               PT Long Term Goals - 11/10/21 1035       PT LONG TERM GOAL #1   Title Pt will improve FOTO to target score of 53  to display perceived improvements in ability to complete ADL's.    Baseline 07/21/2021=  45; 1/5=51. 10/12/2021- Just reassessed for progress note so did not reassess again- will continue with goal into new certification 2/29: 79%    Time 12    Period Weeks    Status On-going    Target Date 01/05/22      PT LONG TERM GOAL #2   Title Pt will improve BERG by at least 5 points in order to demonstrate clinically significant improvement in balance.    Baseline 07/21/2021= 39/56; 08/21/2021=46/56- Will keep goal active to ensure patient able to test consistently; 10/05/2021= 52/56    Time 12    Period Weeks    Status Achieved    Target Date 10/13/21      PT LONG TERM GOAL #3   Title Pt will decrease 5TSTS by at least 5 seconds in order to demonstrate clinically significant improvement in LE strength.    Baseline 07/21/2021= 21 sec without UE Support; 11/21= 14.0 sec without UE support-.Will Keep goal active to ensure consistency.  10/05/2021=14.75 sec without UE support  (improved from 21 sec and unchanged from last assessment of 14 sec)    Time 12    Period Weeks    Status Achieved    Target Date 10/13/21      PT LONG TERM GOAL #4   Title Pt will decrease TUG to below  18 seconds/decrease in order to demonstrate decreased fall risk.    Baseline 07/21/2021= 23 sec with 4WW; 08/21/2021= 22.13 sec with 4WW; 10/05/2021= 19.51 sec avg with 4WW 2/10: 15.4 seconds with cane    Time 12    Period Weeks    Status Achieved    Target Date 01/05/22      PT LONG TERM GOAL #5   Title Pt will increase 10MWT by at least 0.23 m/s  in order to demonstrate clinically significant improvement in community ambulation.    Baseline 07/21/2021= 0.55ms; 08/21/2021= 0.57 m/s using 4WW; 10/05/2021= 0.6 m/s avg using 4WW 2/10: 0.51 m/s with cane    Time 12    Period Weeks    Status Partially Met    Target Date 01/05/22                   Plan - 12/11/21 1342     Clinical Impression Statement Patient exhibited difficulty with gait activities today. Only able to walk brief distances overall prior to  left foot drag and becoming unsafe. He did do better with on land walking using cane versus 2 hand assist on railing using Treadmill. Overall he ambulated with increased gait distance collectively yet remains at increased risk of falling - instructed to use his cane for short distance and walker for long distance. The patient will benefit from further skilled PT to improve balance, strength, gait and safety with ADLs    Personal Factors and Comorbidities Comorbidity 3+    Comorbidities A- Fib, Renal cancer, Addison's disease, gout, arthritis, DM    Examination-Activity Limitations Caring for Others;Carry;Dressing;Lift;Reach Overhead;Squat;Stairs;Transfers;Stand    Examination-Participation Restrictions Community Activity;Cleaning;Driving;Yard Work;Shop    Stability/Clinical Decision Making Evolving/Moderate complexity    Rehab Potential Good    PT Frequency 2x / week    PT Duration 12 weeks    PT Treatment/Interventions ADLs/Self Care Home Management;Canalith Repostioning;Cryotherapy;Electrical Stimulation;Moist Heat;Traction;Ultrasound;DME Instruction;Gait training;Stair training;Functional mobility training;Therapeutic activities;Therapeutic exercise;Balance training;Neuromuscular re-education;Patient/family education;Orthotic Fit/Training;Wheelchair mobility training;Manual techniques;Passive range of motion;Dry needling;Energy conservation;Taping;Vestibular    PT Next Visit Plan Continue with progress LE strengthening; gait training for safe mobility, Neuromuscular re-ed for improved overall balance.  Continue plan of care as previously indicated    PT Home Exercise Plan 08/17/2021-Access Code: [email or web address]; no updates    Consulted and Agree with Plan of Care Patient;Family member/caregiver    Family Member Consulted WIfe- Tony Huffman             Patient will benefit from skilled therapeutic intervention in order to improve the following deficits and impairments:  Abnormal gait, Decreased activity  tolerance, Decreased balance, Decreased coordination, Decreased endurance, Decreased knowledge of use of DME, Decreased mobility, Decreased strength, Difficulty walking, Hypomobility, Impaired sensation, Impaired vision/preception, Postural dysfunction, Pain  Visit Diagnosis: Abnormality of gait and mobility  Difficulty in walking, not elsewhere classified  Muscle weakness (generalized)  Other lack of coordination  Unsteadiness on feet     Problem List Patient Active Problem List   Diagnosis Date Noted   [place]isons disease ([place] 03/16/2021   Low HDL (under 40) 03/16/2021   Difficulty sleeping 11/21/2020   Numbness and tingling 11/21/2020   Difficulty walking 01/14/2020   History of recent fall 01/14/2020   Sleep disorder 01/14/2020   Pancreatic mass 08/01/2018   H/O sebaceous cyst 01/06/2018   Tubular adenoma 10/28/2017   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   Edema leg 03/07/2017   Hx of atrial flutter 03/07/2017   OSA (obstructive sleep apnea) 07/24/2016   RLS (restless legs syndrome) 07/24/2016   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 10/04/2015   Atrial fibrillation and flutter ([place] 10/20/2014   Hyperlipidemia 06/16/2014   Adrenal crisis ([place] 04/19/2014   Cough 03/29/2014   Weakness 03/29/2014   Cough syncope 10/15/2013   URI (upper respiratory infection) 10/15/2013   Dyslipidemia 09/04/2013   Gout 09/04/2013   Chronic kidney disease, stage III (moderate) ([place] 11/13/2011   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   Hypothyroidism (acquired)  11/30/2010   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[place]) 11/10/2010    Lewis Moccasin, PT 12/11/2021, 2:56 PM  [place] MAIN [facility] SERVICES [address] [place], [place], [address] Phone: [phone]   Fax:  [phone]  Name: Tony Huffman MRN: [id number] Date of Birth: 1946-04-06

## 2021-12-14 ENCOUNTER — Ambulatory Visit: Payer: Medicare Other

## 2021-12-14 ENCOUNTER — Other Ambulatory Visit: Payer: Self-pay

## 2021-12-14 DIAGNOSIS — R269 Unspecified abnormalities of gait and mobility: Secondary | ICD-10-CM | POA: Diagnosis not present

## 2021-12-14 NOTE — Therapy (Signed)
[facility] ?[place], [place], [address] ?Phone: [phone]   Fax:  [phone] ? ?Physical Therapy Treatment/Physical Therapy Progress Note ? ? ?Dates of reporting period  11/10/2021  to   12/14/2021 ? ?Patient Details  ?Name: Tony Huffman ?MRN: [id number] ?Date of Birth: December 04, 1945 ?Referring Provider (PT): Dr. Joselyn Arrow ? ? ?Encounter Date: 12/14/2021 ? ? PT End of Session - 12/14/21 0811   ? ? Visit Number 40   ? Number of Visits 47   ? Date for PT Re-Evaluation 01/05/22   ? Authorization Type Medicare Part B; next session 1/10 PN 11/10/21   ? Authorization Time Period 07/21/2021- 10/13/2021; 10/13/2021-01/05/2022   ? Progress Note Due on Visit 30   ? PT Start Time 0805   ? PT Stop Time 0844   ? PT Time Calculation (min) 39 min   ? Equipment Utilized During Treatment Gait belt   ? Activity Tolerance Patient tolerated treatment well   ? Behavior During Therapy [facility] for tasks assessed/performed   ? ?  ?  ? ?  ? ? ?Past Medical History:  ?Diagnosis Date  ? Abnormal heart rhythm 10/20/2014  ? Addison's disease ([place])   ? Adrenal insufficiency ([place])   ? Allergy   ? Ambulates with cane   ? Anxiety   ? Arrhythmia   ? Cataract cortical, senile   ? 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 Chronic kidney disease, stage 3 ([place]) 11/13/2011  ? Cough 03/29/2014  ? Edema leg 03/07/2017  ? Elevated cholesterol with high triglycerides 09/04/2013  ? Elevated red blood cell count   ? Erectile dysfunction   ? Falls frequently   ? Fever 03/29/2014  ? Generalized weakness 03/29/2014  ? GERD (gastroesophageal reflux disease)   ? Gout 09/04/2013  ? H/O adrenal insufficiency 05/17/2014  ? H/O atrial flutter 03/07/2017  ? H/O atrial flutter   ? H/O eye surgery   ? H/O sebaceous cyst 01/06/2018  ? H/O thyroid disease   ? H/O upper respiratory infection 10/15/2013  ? Hearing loss   ? History of back surgery   ? History of blepharoplasty   ? History of cancer 10/04/2015  ? History of  chemotherapy   ? History of esophagogastroduodenoscopy (EGD)   ? History of prediabetes   ? Hyperlipidemia 06/16/2014  ? Hypertension   ? Hypertriglyceridemia   ? Hypothyroidism (acquired) 04/14/2014  ? Left leg swelling 11/23/2016  ? Low HDL (under 40)   ? 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[place]) 11/10/2010  ? Malignant neoplasm of unspecified kidney, except renal pelvis ([place])   ? OSA (obstructive sleep apnea) 07/24/2016  ? OSA (obstructive sleep apnea)   ? PAF (paroxysmal atrial fibrillation) ([place])   ? PAF (paroxysmal atrial fibrillation) ([place])   ? Pancreatic mass 08/01/2018  ? Renal carcinoma ([place]) 06/16/2015  ? RLS (restless legs syndrome) 07/24/2016  ? Sleep apnea   ? Testicular hypofunction   ? Tubular adenoma 10/28/2017  ? Tussive syncopes 10/15/2013  ? Type 2 diabetes mellitus ([place])   ? Vitamin D deficiency disease   ? ? ?Past Surgical History:  ?Procedure Laterality Date  ? ADRENALECTOMY    ? CATARACT EXTRACTION  10/2014  ? CATARACT EXTRACTION EXTRACAPSULAR  11/05/2016  ? with Insertion Intraocular Prostheis.   ? COLONOSCOPY  05/06/2017  ? with removal lesions by snare.  ? COLONOSCOPY W/ BIOPSIES    ? 05/06/2017  ? NEPHRECTOMY    ? POPLITEAL SYNOVIAL CYST EXCISION    ?  [place]  ? THYROIDECTOMY    ? THYROIDECTOMY, PARTIAL    ? TONSILLECTOMY    ? ? ?There were no vitals filed for this visit. ? ? Subjective Assessment - 12/14/21 0810   ? ? Subjective Patient reports no new issues. States having a good morning so far.   ? Patient is accompained by: Family member   wife - Tony Huffman  ? Pertinent History Patient is a 76 year old male with referral from Neurology- Dr. Joselyn Arrow for unspecified abnormality of gait. He presents today with his wife and reports > 6 month history of progressive issues with poor balance and multiple falls. Patient has renal Cancer and currently has Pallative care- Was stabilized out of Hospice. Patient presents with the following past medical history: A-Fib and flutter, Lung Cancer,  Obstructive sleep apnea, Upper respiratory infection, Addison's disease, Hypothyroidism, Renal Cancer, DM, gout, arthritis, Chronic kidney disease and Restless leg syndrome. Patient lives with wife in independent living at [facility]. Prior level of function was independent with transfers and mobility using walker yet some assist with dressing.   ? Limitations Lifting;Standing;Walking;House hold activities   ? How long can you sit comfortably? No issues   ? How long can you stand comfortably? <5 min   ? How long can you walk comfortably? <5 min   ? Diagnostic tests EXAM:  MRI HEAD WITHOUT CONTRAST     TECHNIQUE:  Multiplanar, multiecho pulse sequences of the brain and surrounding  structures were obtained without intravenous contrast.     COMPARISON:  CT head without contrast 01/20/2021     FINDINGS:  Brain: Moderate generalized atrophy is present. 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 Ventricles are enlarged  bilaterally. There is mild ventricular asymmetry, the right is  larger.     No acute infarct, hemorrhage, or mass lesion is present. No other  significant extra-axial collection is present.     The internal auditory canals are within normal limits. The brainstem  and cerebellum are within normal limits.     Vascular: Flow is present in the major intracranial arteries.     Skull and upper cervical spine: The craniocervical junction is  normal. Upper cervical spine is within normal limits. Marrow signal  is unremarkable.     Sinuses/Orbits: The paranasal sinuses and mastoid air cells are  clear. Bilateral lens replacements are noted. Globes and orbits are  otherwise unremarkable.     IMPRESSION:  1. No acute intracranial abnormality.  2. Moderate generalized atrophy and white matter disease likely  reflects the sequela of chronic microvascular ischemia.  3. Arachnoid cyst over the left convexity.   ? Patient Stated Goals I would like to walk better and not fall   ? Currently in Pain? No/denies    ? ?  ?  ? ?  ? ? ? ? ?INTERVENTIONS:  ? ?Therapeutic Exercises: ? ?Seated hip march 3lb AW x 15 reps alt LE's ?Seated knee ext 3lb x 15 reps alt LE's ?Seated calf raises 3lb AW x 15 reps BLE's  ? ?Reassessed Goals:  ? ? ?5x STS= 13.88 sec without UE support ?TUG=17.17 sec with SPC; 18.82 sec on 2nd trial= 18.0 sec avg ?10 MWT= 15.55 sec and 15.08 sec= 15.32 sec avg= 0.65 m/s ? ?6 min walk= (REST BP= 119/73; HR= 84 bpm; 02 sat= 97% ?= 280 feet in 3 min 40 sec - BP= 133/75  ? ?Patient fatigued after walking today requiring more rest break.  ? ?  ? ? ? ? ? ? ? ? ? ? ? ? ? ? ? ? ? ?  PT Education - 12/14/21 0811   ? ? Education Details Exercise technique   ? Person(s) Educated Patient   ? Methods Explanation;Demonstration;Tactile cues;Verbal cues   ? Comprehension Verbalized understanding;Returned demonstration;Verbal cues required;Tactile cues required;Need further instruction   ? ?  ?  ? ?  ? ? ? PT Short Term Goals - 08/21/21 0814   ? ?  ? PT SHORT TERM GOAL #1  ? Title Pt will be independent with HEP in order to improve strength and balance in order to decrease fall risk and improve function at home and work.   ? Baseline 07/21/2021- Paitent presents with no formal HEP in place. 08/21/2021- Patient reports compliance with HEP and states no questions as of today- able to perform standin using his rollator for support.   ? Time 6   ? Period Weeks   ? Status Achieved   ? Target Date 09/01/21   ? ?  ?  ? ?  ? ? ? ? PT Long Term Goals - 12/14/21 0815   ? ?  ? PT LONG TERM GOAL #1  ? Title Pt will improve FOTO to target score of 53  to display perceived improvements in ability to complete ADL's.   ? Baseline 07/21/2021= 45; 1/5=51. 10/12/2021- Just reassessed for progress note so did not reassess again- will continue with goal into new certification 3/24: 40%   ? Time 12   ? Period Weeks   ? Status On-going   ? Target Date 01/05/22   ?  ? PT LONG TERM GOAL #2  ? Title Pt will improve BERG by at least 5 points in order to  demonstrate clinically significant improvement in balance.   ? Baseline 07/21/2021= 39/56; 08/21/2021=46/56- Will keep goal active to ensure patient able to test consistently; 10/05/2021= 52/56   ? Time

## 2021-12-18 ENCOUNTER — Ambulatory Visit: Payer: Medicare Other

## 2021-12-18 ENCOUNTER — Other Ambulatory Visit: Payer: Self-pay

## 2021-12-18 DIAGNOSIS — R2681 Unsteadiness on feet: Secondary | ICD-10-CM

## 2021-12-18 DIAGNOSIS — R278 Other lack of coordination: Secondary | ICD-10-CM

## 2021-12-18 DIAGNOSIS — M6281 Muscle weakness (generalized): Secondary | ICD-10-CM

## 2021-12-18 DIAGNOSIS — R2689 Other abnormalities of gait and mobility: Secondary | ICD-10-CM

## 2021-12-18 DIAGNOSIS — R269 Unspecified abnormalities of gait and mobility: Secondary | ICD-10-CM

## 2021-12-18 DIAGNOSIS — R262 Difficulty in walking, not elsewhere classified: Secondary | ICD-10-CM

## 2021-12-18 NOTE — Therapy (Signed)
[facility] ?[place], [place], [address] ?Phone: [phone]   Fax:  [phone] ? ?Physical Therapy Treatment ? ?Patient Details  ?Name: Tony [place] ?MRN: [id number] ?Date of Birth: 1946-02-26 ?Referring Provider (PT): Dr. Joselyn Arrow ? ? ?Encounter Date: 12/18/2021 ? ? PT End of Session - 12/18/21 7425   ? ? Visit Number 41   ? Number of Visits 47   ? Date for PT Re-Evaluation 01/05/22   ? Authorization Type Medicare Part B; next session 1/10 PN 11/10/21   ? Authorization Time Period 07/21/2021- 10/13/2021; 10/13/2021-01/05/2022   ? Progress Note Due on Visit 30   ? PT Start Time [id number]   ? PT Stop Time 8756   ? PT Time Calculation (min) 41 min   ? Equipment Utilized During Treatment Gait belt   ? Activity Tolerance Patient tolerated treatment well   ? Behavior During Therapy [facility] for tasks assessed/performed   ? ?  ?  ? ?  ? ? ?Past Medical History:  ?Diagnosis Date  ? Abnormal heart rhythm 10/20/2014  ? Addison's disease ([place])   ? Adrenal insufficiency ([place])   ? Allergy   ? Ambulates with cane   ? Anxiety   ? Arrhythmia   ? Cataract cortical, senile   ? 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 Chronic kidney disease, stage 3 ([place]) 11/13/2011  ? Cough 03/29/2014  ? Edema leg 03/07/2017  ? Elevated cholesterol with high triglycerides 09/04/2013  ? Elevated red blood cell count   ? Erectile dysfunction   ? Falls frequently   ? Fever 03/29/2014  ? Generalized weakness 03/29/2014  ? GERD (gastroesophageal reflux disease)   ? Gout 09/04/2013  ? H/O adrenal insufficiency 05/17/2014  ? H/O atrial flutter 03/07/2017  ? H/O atrial flutter   ? H/O eye surgery   ? H/O sebaceous cyst 01/06/2018  ? H/O thyroid disease   ? H/O upper respiratory infection 10/15/2013  ? Hearing loss   ? History of back surgery   ? History of blepharoplasty   ? History of cancer 10/04/2015  ? History of chemotherapy   ? History of esophagogastroduodenoscopy (EGD)   ? History of prediabetes    ? Hyperlipidemia 06/16/2014  ? Hypertension   ? Hypertriglyceridemia   ? Hypothyroidism (acquired) 04/14/2014  ? Left leg swelling 11/23/2016  ? Low HDL (under 40)   ? 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[place]) 11/10/2010  ? Malignant neoplasm of unspecified kidney, except renal pelvis ([place])   ? OSA (obstructive sleep apnea) 07/24/2016  ? OSA (obstructive sleep apnea)   ? PAF (paroxysmal atrial fibrillation) ([place])   ? PAF (paroxysmal atrial fibrillation) ([place])   ? Pancreatic mass 08/01/2018  ? Renal carcinoma ([place]) 06/16/2015  ? RLS (restless legs syndrome) 07/24/2016  ? Sleep apnea   ? Testicular hypofunction   ? Tubular adenoma 10/28/2017  ? Tussive syncopes 10/15/2013  ? Type 2 diabetes mellitus ([place])   ? Vitamin D deficiency disease   ? ? ?Past Surgical History:  ?Procedure Laterality Date  ? ADRENALECTOMY    ? CATARACT EXTRACTION  10/2014  ? CATARACT EXTRACTION EXTRACAPSULAR  11/05/2016  ? with Insertion Intraocular Prostheis.   ? COLONOSCOPY  05/06/2017  ? with removal lesions by snare.  ? COLONOSCOPY W/ BIOPSIES    ? 05/06/2017  ? NEPHRECTOMY    ? POPLITEAL SYNOVIAL CYST EXCISION    ? [name]  ? THYROIDECTOMY    ? THYROIDECTOMY, PARTIAL    ?  TONSILLECTOMY    ? ? ?There were no vitals filed for this visit. ? ? Subjective Assessment - 12/18/21 0853   ? ? Subjective Patient reports he survived this past weekend on his own while his wife was out of town   ? Patient is accompained by: Family member   wife - [name]  ? Pertinent History Patient is a 76 year old male with referral from Neurology- Dr. Joselyn Arrow for unspecified abnormality of gait. He presents today with his wife and reports > 6 month history of progressive issues with poor balance and multiple falls. Patient has renal Cancer and currently has Pallative care- Was stabilized out of Hospice. Patient presents with the following past medical history: A-Fib and flutter, Lung Cancer, Obstructive sleep apnea, Upper respiratory infection, Addison's  disease, Hypothyroidism, Renal Cancer, DM, gout, arthritis, Chronic kidney disease and Restless leg syndrome. Patient lives with wife in independent living at [facility]. Prior level of function was independent with transfers and mobility using walker yet some assist with dressing.   ? Limitations Lifting;Standing;Walking;House hold activities   ? How long can you sit comfortably? No issues   ? How long can you stand comfortably? <5 min   ? How long can you walk comfortably? <5 min   ? Diagnostic tests EXAM:  MRI HEAD WITHOUT CONTRAST     TECHNIQUE:  Multiplanar, multiecho pulse sequences of the brain and surrounding  structures were obtained without intravenous contrast.     COMPARISON:  CT head without contrast 01/20/2021     FINDINGS:  Brain: Moderate generalized atrophy is present. 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 Ventricles are enlarged  bilaterally. There is mild ventricular asymmetry, the right is  larger.     No acute infarct, hemorrhage, or mass lesion is present. No other  significant extra-axial collection is present.     The internal auditory canals are within normal limits. The brainstem  and cerebellum are within normal limits.     Vascular: Flow is present in the major intracranial arteries.     Skull and upper cervical spine: The craniocervical junction is  normal. Upper cervical spine is within normal limits. Marrow signal  is unremarkable.     Sinuses/Orbits: The paranasal sinuses and mastoid air cells are  clear. Bilateral lens replacements are noted. Globes and orbits are  otherwise unremarkable.     IMPRESSION:  1. No acute intracranial abnormality.  2. Moderate generalized atrophy and white matter disease likely  reflects the sequela of chronic microvascular ischemia.  3. Arachnoid cyst over the left convexity.   ? Patient Stated Goals I would like to walk better and not fall   ? Currently in Pain? No/denies   ? ?  ?  ? ?  ? ? ? ? ? ?INTERVENTIONS:  ? ?Therapeutic  Exercises:  ? ?Step tap- Alt LE - using BUE support  x 2 min- 32 step tap each leg. ? ?Step up - Alt LE - BUE support x 2 min - 16 steps each LE. ? ? ?Gait - 1 lap around (approx 150 feet) using cane- VC for step length. Increased left foot drag after approx 80 feet and 1 stoppage to regroup  ? ?Mini lunge (onto step) - x 20 reps each LE ?- Patient rates as "medium hard"  ? ?- Sit to stand x 15 reps (min UE support) - patient reports "that's all I can do"  ? ?- Seated Hip flex/abd (straight leg) - Leg  initially resting straight onto a 4 in step- lift up and over 1/2 spike ball x 15 reps each Leg- Patient rates as hard.  ? ? ?Education provided throughout session via VC/TC and demonstration to facilitate movement at target joints and correct muscle activation for all testing and exercises performed.  ? ? ? ? ? ? ? ? ? ? ? ? ? ? ? ? ? ? ? ? PT Education - 12/18/21 0858   ? ? Education Details Exercise technique   ? Person(s) Educated Patient   ? Methods Explanation;Demonstration;Tactile cues;Verbal cues   ? Comprehension Verbalized understanding;Returned demonstration;Verbal cues required;Tactile cues required;Need further instruction   ? ?  ?  ? ?  ? ? ? PT Short Term Goals - 08/21/21 0814   ? ?  ? PT SHORT TERM GOAL #1  ? Title Pt will be independent with HEP in order to improve strength and balance in order to decrease fall risk and improve function at home and work.   ? Baseline 07/21/2021- Paitent presents with no formal HEP in place. 08/21/2021- Patient reports compliance with HEP and states no questions as of today- able to perform standin using his rollator for support.   ? Time 6   ? Period Weeks   ? Status Achieved   ? Target Date 09/01/21   ? ?  ?  ? ?  ? ? ? ? PT Long Term Goals - 12/14/21 0815   ? ?  ? PT LONG TERM GOAL #1  ? Title Pt will improve FOTO to target score of 53  to display perceived improvements in ability to complete ADL's.   ? Baseline 07/21/2021= 45; 1/5=51. 10/12/2021- Just reassessed for  progress note so did not reassess again- will continue with goal into new certification 1/63: 84%   ? Time 12   ? Period Weeks   ? Status On-going   ? Target Date 01/05/22   ?  ? PT LONG TERM GOAL #2  ? Title Pt

## 2021-12-22 ENCOUNTER — Ambulatory Visit: Payer: Medicare Other

## 2021-12-22 ENCOUNTER — Other Ambulatory Visit: Payer: Self-pay

## 2021-12-22 DIAGNOSIS — R269 Unspecified abnormalities of gait and mobility: Secondary | ICD-10-CM

## 2021-12-22 DIAGNOSIS — R2681 Unsteadiness on feet: Secondary | ICD-10-CM

## 2021-12-22 DIAGNOSIS — M6281 Muscle weakness (generalized): Secondary | ICD-10-CM

## 2021-12-22 DIAGNOSIS — R278 Other lack of coordination: Secondary | ICD-10-CM

## 2021-12-22 DIAGNOSIS — R262 Difficulty in walking, not elsewhere classified: Secondary | ICD-10-CM

## 2021-12-22 NOTE — Therapy (Signed)
[place] [facility] ?[place], [place], [address] ?Phone: [phone]   Fax:  [phone] ? ?Physical Therapy Treatment ? ?Patient Details  ?Name: Tony [place] ?MRN: [id number] ?Date of Birth: 09/09/1946 ?Referring Provider (PT): Dr. Joselyn [place] ? ? ?Encounter Date: 12/22/2021 ? ? PT End of Session - 12/22/21 0843   ? ? Visit Number 42   ? Number of Visits 47   ? Date for PT Re-Evaluation 01/05/22   ? Authorization Type Medicare Part B; next session 1/10 PN 11/10/21   ? Authorization Time Period 07/21/2021- 10/13/2021; 10/13/2021-01/05/2022   ? Progress Note Due on Visit 50   ? PT Start Time [phone]   ? PT Stop Time 0930   ? PT Time Calculation (min) 46 min   ? Equipment Utilized During Treatment Gait belt   ? Activity Tolerance Patient tolerated treatment well   ? Behavior During Therapy [facility] for tasks assessed/performed   ? ?  ?  ? ?  ? ? ?Past Medical History:  ?Diagnosis Date  ? Abnormal heart rhythm 10/20/2014  ? Addison's disease ([place])   ? Adrenal insufficiency ([place])   ? Allergy   ? Ambulates with cane   ? Anxiety   ? Arrhythmia   ? Cataract cortical, senile   ? 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 Chronic kidney disease, stage 3 ([place]) 11/13/2011  ? Cough 03/29/2014  ? Edema leg 03/07/2017  ? Elevated cholesterol with high triglycerides 09/04/2013  ? Elevated red blood cell count   ? Erectile dysfunction   ? Falls frequently   ? Fever 03/29/2014  ? Generalized weakness 03/29/2014  ? GERD (gastroesophageal reflux disease)   ? Gout 09/04/2013  ? H/O adrenal insufficiency 05/17/2014  ? H/O atrial flutter 03/07/2017  ? H/O atrial flutter   ? H/O eye surgery   ? H/O sebaceous cyst 01/06/2018  ? H/O thyroid disease   ? H/O upper respiratory infection 10/15/2013  ? Hearing loss   ? History of back surgery   ? History of blepharoplasty   ? History of cancer 10/04/2015  ? History of chemotherapy   ? History of esophagogastroduodenoscopy (EGD)   ? History of prediabetes    ? Hyperlipidemia 06/16/2014  ? Hypertension   ? Hypertriglyceridemia   ? Hypothyroidism (acquired) 04/14/2014  ? Left leg swelling 11/23/2016  ? Low HDL (under 40)   ? 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[place]) 11/10/2010  ? Malignant neoplasm of unspecified kidney, except renal pelvis ([place])   ? OSA (obstructive sleep apnea) 07/24/2016  ? OSA (obstructive sleep apnea)   ? PAF (paroxysmal atrial fibrillation) ([place])   ? PAF (paroxysmal atrial fibrillation) ([place])   ? Pancreatic mass 08/01/2018  ? Renal carcinoma ([place]) 06/16/2015  ? RLS (restless legs syndrome) 07/24/2016  ? Sleep apnea   ? Testicular hypofunction   ? Tubular adenoma 10/28/2017  ? Tussive syncopes 10/15/2013  ? Type 2 diabetes mellitus ([place])   ? Vitamin D deficiency disease   ? ? ?Past Surgical History:  ?Procedure Laterality Date  ? ADRENALECTOMY    ? CATARACT EXTRACTION  10/2014  ? CATARACT EXTRACTION EXTRACAPSULAR  11/05/2016  ? with Insertion Intraocular Prostheis.   ? COLONOSCOPY  05/06/2017  ? with removal lesions by snare.  ? COLONOSCOPY W/ BIOPSIES    ? 05/06/2017  ? NEPHRECTOMY    ? POPLITEAL SYNOVIAL CYST EXCISION    ? [place]  ? THYROIDECTOMY    ? THYROIDECTOMY, PARTIAL    ?  TONSILLECTOMY    ? ? ?There were no vitals filed for this visit. ? ? Subjective Assessment - 12/22/21 0842   ? ? Subjective Patient reports doing well overall without any new complaints. Denies any falls or pain today. Continues to use his cane for most mobility.   ? Patient is accompained by: Family member   wife - Tony [place]  ? Pertinent History Patient is a 76 year old male with referral from Neurology- Dr. Joselyn [place] for unspecified abnormality of gait. He presents today with his wife and reports > 6 month history of progressive issues with poor balance and multiple falls. Patient has renal Cancer and currently has Pallative care- Was stabilized out of Hospice. Patient presents with the following past medical history: A-Fib and flutter, Lung Cancer, Obstructive  sleep apnea, Upper respiratory infection, Addison's disease, Hypothyroidism, Renal Cancer, DM, gout, arthritis, Chronic kidney disease and Restless leg syndrome. Patient lives with wife in independent living at [facility]. Prior level of function was independent with transfers and mobility using walker yet some assist with dressing.   ? Limitations Lifting;Standing;Walking;House hold activities   ? How long can you sit comfortably? No issues   ? How long can you stand comfortably? <5 min   ? How long can you walk comfortably? <5 min   ? Diagnostic tests EXAM:  MRI HEAD WITHOUT CONTRAST     TECHNIQUE:  Multiplanar, multiecho pulse sequences of the brain and surrounding  structures were obtained without intravenous contrast.     COMPARISON:  CT head without contrast 01/20/2021     FINDINGS:  Brain: Moderate generalized atrophy is present. 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 Ventricles are enlarged  bilaterally. There is mild ventricular asymmetry, the right is  larger.     No acute infarct, hemorrhage, or mass lesion is present. No other  significant extra-axial collection is present.     The internal auditory canals are within normal limits. The brainstem  and cerebellum are within normal limits.     Vascular: Flow is present in the major intracranial arteries.     Skull and upper cervical spine: The craniocervical junction is  normal. Upper cervical spine is within normal limits. Marrow signal  is unremarkable.     Sinuses/Orbits: The paranasal sinuses and mastoid air cells are  clear. Bilateral lens replacements are noted. Globes and orbits are  otherwise unremarkable.     IMPRESSION:  1. No acute intracranial abnormality.  2. Moderate generalized atrophy and white matter disease likely  reflects the sequela of chronic microvascular ischemia.  3. Arachnoid cyst over the left convexity.   ? Patient Stated Goals I would like to walk better and not fall   ? Currently in Pain? No/denies   ? ?  ?  ? ?   ? ? ?INTERVENTIONS:  ? ?Therapeutic exercises:  ? ?Interval Training- HIIT on [place]:  ?L0 for 1 min ?L4 for 1 min ?L0 for 1 min ?L5 for 1 min ?L0 for 1 mn ?L4 for 1 min ?L0 for 1 min ?L5 for 1 min ?L0 for 2 min ?Patient rates as "very tiring" ?RPE= 4/10 on Modified BORG Scale ?Total distance = 0.31 mi using LE only ?HR= 90 bpm; O2 sat = 98% ? ?Precor Leg press  ? ? ?40 lb using both leg together - 2 sets x 12 reps ?25 lb single leg x 15 reps each x 2 sets - Patient reports Okay on the right but difficult on  the left ? ?[place] training  ? ?Follow the dot- game x 5 - Using BUE support and patient with some difficulty coordinating movement to left side vs. Right. He reported increased Left LE with prolonged standing.  ?Maze activity x 2 - difficulty coordinating movement to left and patient reports increased overall fatigue.  ? ?Biodex TM x 3 min - gait at 0.7 mph x 1 min down to 0.5 mph x 2 min-B UE support with increased difficulty advancing Left LE. Several instances of dragging left LE requiring PT to stop TM briefly. Patient fatigued at end of session.  ?Education provided throughout session via VC/TC and demonstration to facilitate movement at target joints and correct muscle activation for all testing and exercises performed.  ? ? ? ? ? ? ? ? ? ? ? ? ? ? ? ? ? ? ? ? ? ? ? PT Education - 12/22/21 0842   ? ? Education Details Exercise technique   ? Person(s) Educated Patient   ? Methods Explanation;Demonstration;Tactile cues;Verbal cues   ? Comprehension Verbalized understanding;Returned demonstration;Verbal cues required;Tactile cues required;Need further instruction   ? ?  ?  ? ?  ? ? ? PT Short Term Goals - 08/21/21 0814   ? ?  ? PT SHORT TERM GOAL #1  ? Title Pt will be independent with HEP in order to improve strength and balance in order to decrease fall risk and improve function at home and work.   ? Baseline 07/21/2021- Paitent presents with no formal HEP in place. 08/21/2021- Patient reports  compliance with HEP and states no questions as of today- able to perform standin using his rollator for support.   ? Time 6   ? Period Weeks   ? Status Achieved   ? Target Date 09/01/21   ? ?  ?  ? ?  ? ? ? ?

## 2021-12-25 ENCOUNTER — Ambulatory Visit: Payer: Medicare Other

## 2021-12-25 ENCOUNTER — Other Ambulatory Visit: Payer: Self-pay

## 2021-12-25 DIAGNOSIS — R2681 Unsteadiness on feet: Secondary | ICD-10-CM

## 2021-12-25 DIAGNOSIS — M6281 Muscle weakness (generalized): Secondary | ICD-10-CM

## 2021-12-25 DIAGNOSIS — R2689 Other abnormalities of gait and mobility: Secondary | ICD-10-CM

## 2021-12-25 DIAGNOSIS — R278 Other lack of coordination: Secondary | ICD-10-CM

## 2021-12-25 DIAGNOSIS — R262 Difficulty in walking, not elsewhere classified: Secondary | ICD-10-CM

## 2021-12-25 DIAGNOSIS — R269 Unspecified abnormalities of gait and mobility: Secondary | ICD-10-CM | POA: Diagnosis not present

## 2021-12-25 NOTE — Therapy (Signed)
[place] ?[facility] ?[place], [place], [address] ?Phone: [phone]   Fax:  [phone] ? ?Physical Therapy Treatment ? ?Patient Details  ?Name: Tony Huffman ?MRN: [id number] ?Date of Birth: 07-Dec-1945 ?Referring Provider (PT): Dr. Joselyn Arrow ? ? ?Encounter Date: 12/25/2021 ? ? PT End of Session - 12/25/21 6384   ? ? Visit Number [id number]   ? Number of Visits 47   ? Date for PT Re-Evaluation 01/05/22   ? Authorization Type Medicare Part B; next session 1/10 PN 11/10/21   ? Authorization Time Period 07/21/2021- 10/13/2021; 10/13/2021-01/05/2022   ? Progress Note Due on Visit 50   ? PT Start Time 0805   ? PT Stop Time 0850   ? PT Time Calculation (min) 45 min   ? Equipment Utilized During Treatment Gait belt   ? Activity Tolerance Patient tolerated treatment well   ? Behavior During Therapy [facility] for tasks assessed/performed   ? ?  ?  ? ?  ? ? ?Past Medical History:  ?Diagnosis Date  ? Abnormal heart rhythm 10/20/2014  ? Addison's disease ([place])   ? Adrenal insufficiency ([place])   ? Allergy   ? Ambulates with cane   ? Anxiety   ? Arrhythmia   ? Cataract cortical, senile   ? 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 Chronic kidney disease, stage 3 ([place]) 11/13/2011  ? Cough 03/29/2014  ? Edema leg 03/07/2017  ? Elevated cholesterol with high triglycerides 09/04/2013  ? Elevated red blood cell count   ? Erectile dysfunction   ? Falls frequently   ? Fever 03/29/2014  ? Generalized weakness 03/29/2014  ? GERD (gastroesophageal reflux disease)   ? Gout 09/04/2013  ? H/O adrenal insufficiency 05/17/2014  ? H/O atrial flutter 03/07/2017  ? H/O atrial flutter   ? H/O eye surgery   ? H/O sebaceous cyst 01/06/2018  ? H/O thyroid disease   ? H/O upper respiratory infection 10/15/2013  ? Hearing loss   ? History of back surgery   ? History of blepharoplasty   ? History of cancer 10/04/2015  ? History of chemotherapy   ? History of esophagogastroduodenoscopy (EGD)   ? History of prediabetes    ? Hyperlipidemia 06/16/2014  ? Hypertension   ? Hypertriglyceridemia   ? Hypothyroidism (acquired) 04/14/2014  ? Left leg swelling 11/23/2016  ? Low HDL (under 40)   ? 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[place]) 11/10/2010  ? Malignant neoplasm of unspecified kidney, except renal pelvis ([place])   ? OSA (obstructive sleep apnea) 07/24/2016  ? OSA (obstructive sleep apnea)   ? PAF (paroxysmal atrial fibrillation) ([place])   ? PAF (paroxysmal atrial fibrillation) ([place])   ? Pancreatic mass 08/01/2018  ? Renal carcinoma ([place]) 06/16/2015  ? RLS (restless legs syndrome) 07/24/2016  ? Sleep apnea   ? Testicular hypofunction   ? Tubular adenoma 10/28/2017  ? Tussive syncopes 10/15/2013  ? Type 2 diabetes mellitus ([place])   ? Vitamin D deficiency disease   ? ? ?Past Surgical History:  ?Procedure Laterality Date  ? ADRENALECTOMY    ? CATARACT EXTRACTION  10/2014  ? CATARACT EXTRACTION EXTRACAPSULAR  11/05/2016  ? with Insertion Intraocular Prostheis.   ? COLONOSCOPY  05/06/2017  ? with removal lesions by snare.  ? COLONOSCOPY W/ BIOPSIES    ? 05/06/2017  ? NEPHRECTOMY    ? POPLITEAL SYNOVIAL CYST EXCISION    ? [place]  ? THYROIDECTOMY    ? THYROIDECTOMY, PARTIAL    ?  TONSILLECTOMY    ? ? ?There were no vitals filed for this visit. ? ? Subjective Assessment - 12/25/21 0816   ? ? Subjective Patient reports doing okay but leg became tired walking in here so had to use a chair.   ? Patient is accompained by: Family member   wife - [name]  ? Pertinent History Patient is a 76 year old male with referral from Neurology- Dr. Joselyn Arrow for unspecified abnormality of gait. He presents today with his wife and reports > 6 month history of progressive issues with poor balance and multiple falls. Patient has renal Cancer and currently has Pallative care- Was stabilized out of Hospice. Patient presents with the following past medical history: A-Fib and flutter, Lung Cancer, Obstructive sleep apnea, Upper respiratory infection, Addison's  disease, Hypothyroidism, Renal Cancer, DM, gout, arthritis, Chronic kidney disease and Restless leg syndrome. Patient lives with wife in independent living at [facility]. Prior level of function was independent with transfers and mobility using walker yet some assist with dressing.   ? Limitations Lifting;Standing;Walking;House hold activities   ? How long can you sit comfortably? No issues   ? How long can you stand comfortably? <5 min   ? How long can you walk comfortably? <5 min   ? Diagnostic tests EXAM:  MRI HEAD WITHOUT CONTRAST     TECHNIQUE:  Multiplanar, multiecho pulse sequences of the brain and surrounding  structures were obtained without intravenous contrast.     COMPARISON:  CT head without contrast 01/20/2021     FINDINGS:  Brain: Moderate generalized atrophy is present. 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 Ventricles are enlarged  bilaterally. There is mild ventricular asymmetry, the right is  larger.     No acute infarct, hemorrhage, or mass lesion is present. No other  significant extra-axial collection is present.     The internal auditory canals are within normal limits. The brainstem  and cerebellum are within normal limits.     Vascular: Flow is present in the major intracranial arteries.     Skull and upper cervical spine: The craniocervical junction is  normal. Upper cervical spine is within normal limits. Marrow signal  is unremarkable.     Sinuses/Orbits: The paranasal sinuses and mastoid air cells are  clear. Bilateral lens replacements are noted. Globes and orbits are  otherwise unremarkable.     IMPRESSION:  1. No acute intracranial abnormality.  2. Moderate generalized atrophy and white matter disease likely  reflects the sequela of chronic microvascular ischemia.  3. Arachnoid cyst over the left convexity.   ? Patient Stated Goals I would like to walk better and not fall   ? Currently in Pain? No/denies   ? ?  ?  ? ?  ? ? ?INTERVENTIONS:  ? ?Neuromuscular re-ed:  Performed at support bar with CGA and use of gait belt unless otherwise stated:  ? ?Side stepping along step beam with airex pad on one end and yellow dynadisc on opp- Side step to each end x 10 each. No UE support - mild unsteadiness ?Side step along blue airex beam x 8 each way requiring min UE support ? ?Staggered standing on blue airex beam - hold 30 sec x 3 trials each leg- Patient performed slightly better with left foot in front.  ? ?Therex:  ? ?Step tap x 20 reps alt LE  ?Step ups x 20 reps alt LE ?Standing calf raises from 1/2 foam x 15 reps with 2  sec hold ?Standing toe raises from 1/2 foam x 15 reps with 2 sec ? ?Sit to stand with 1 foot on airex pad x 5 each LE (without UE support)  ? ?Heel to to gait sequencing (1 UE on support bar) and hedgehog placed in reference to heel strike position- Patient performed 20 reps each LE. Much more accurate with right heel striking in proper position versus left heel.  ? ? ?Gait in clinic - walking 160 feet using single point cane- initially good yet short reciprocal step for approx 100 feet yet did began shuffling left foot- Able to stop and regroup on his own and complete walk.  ? ?Education provided throughout session via VC/TC and demonstration to facilitate movement at target joints and correct muscle activation for all testing and exercises performed.  ? ? ? ? ? ? ? ? ? ? ? ? ? ? ? ? ? ? ? ? ? ? ? ? ? ? ? PT Education - 12/25/21 0918   ? ? Education Details Exercise technique   ? Person(s) Educated Patient   ? Methods Explanation;Demonstration;Tactile cues;Verbal cues   ? Comprehension Verbalized understanding;Returned demonstration;Verbal cues required;Tactile cues required;Need further instruction   ? ?  ?  ? ?  ? ? ? PT Short Term Goals - 08/21/21 0814   ? ?  ? PT SHORT TERM GOAL #1  ? Title Pt will be independent with HEP in order to improve strength and balance in order to decrease fall risk and improve function at home and work.   ? Baseline 07/21/2021-  Paitent presents with no formal HEP in place. 08/21/2021- Patient reports compliance with HEP and states no questions as of today- able to perform standin using his rollator for support.   ? Time 6   ? Period [place]

## 2021-12-29 ENCOUNTER — Ambulatory Visit: Payer: Medicare Other

## 2021-12-29 DIAGNOSIS — R278 Other lack of coordination: Secondary | ICD-10-CM

## 2021-12-29 DIAGNOSIS — R2689 Other abnormalities of gait and mobility: Secondary | ICD-10-CM

## 2021-12-29 DIAGNOSIS — R262 Difficulty in walking, not elsewhere classified: Secondary | ICD-10-CM

## 2021-12-29 DIAGNOSIS — M6281 Muscle weakness (generalized): Secondary | ICD-10-CM

## 2021-12-29 DIAGNOSIS — R269 Unspecified abnormalities of gait and mobility: Secondary | ICD-10-CM

## 2021-12-29 DIAGNOSIS — R2681 Unsteadiness on feet: Secondary | ICD-10-CM

## 2021-12-29 NOTE — Therapy (Signed)
[place] ?[facility] ?[place], [place], [address] ?Phone: [phone]   Fax:  [phone] ? ?Physical Therapy Treatment ? ?Patient Details  ?Name: Tony Huffman ?MRN: [id number] ?Date of Birth: [birth year] ?Referring Provider (PT): Dr. Joselyn Arrow ? ? ?Encounter Date: 12/29/2021 ? ? PT End of Session - 12/29/21 0854   ? ? Visit Number 44   ? Number of Visits 47   ? Date for PT Re-Evaluation 01/05/22   ? Authorization Type Medicare Part B; next session 1/10 PN 11/10/21   ? Authorization Time Period 07/21/2021- 10/13/2021; 10/13/2021-01/05/2022   ? Progress Note Due on Visit 50   ? PT Start Time [phone]   ? PT Stop Time 0929   ? PT Time Calculation (min) 42 min   ? Equipment Utilized During Treatment Gait belt   ? Activity Tolerance Patient tolerated treatment well   ? Behavior During Therapy [facility] for tasks assessed/performed   ? ?  ?  ? ?  ? ? ?Past Medical History:  ?Diagnosis Date  ? Abnormal heart rhythm 10/20/2014  ? 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 Adrenal insufficiency ([place])   ? Allergy   ? Ambulates with cane   ? Anxiety   ? Arrhythmia   ? Cataract cortical, senile   ? 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 Chronic kidney disease, stage 3 ([place]) 11/13/2011  ? Cough 03/29/2014  ? Edema leg 03/07/2017  ? Elevated cholesterol with high triglycerides 09/04/2013  ? Elevated red blood cell count   ? Erectile dysfunction   ? Falls frequently   ? Fever 03/29/2014  ? Generalized weakness 03/29/2014  ? GERD (gastroesophageal reflux disease)   ? Gout 09/04/2013  ? H/O adrenal insufficiency 05/17/2014  ? H/O atrial flutter 03/07/2017  ? H/O atrial flutter   ? H/O eye surgery   ? H/O sebaceous cyst 01/06/2018  ? H/O thyroid disease   ? H/O upper respiratory infection 10/15/2013  ? Hearing loss   ? History of back surgery   ? History of blepharoplasty   ? History of cancer 10/04/2015  ? History of chemotherapy   ? History of esophagogastroduodenoscopy (EGD)   ? History of prediabetes    ? Hyperlipidemia 06/16/2014  ? Hypertension   ? Hypertriglyceridemia   ? Hypothyroidism (acquired) 04/14/2014  ? Left leg swelling 11/23/2016  ? Low HDL (under 40)   ? 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[place]) 11/10/2010  ? Malignant neoplasm of unspecified kidney, except renal pelvis ([place])   ? OSA (obstructive sleep apnea) 07/24/2016  ? OSA (obstructive sleep apnea)   ? PAF (paroxysmal atrial fibrillation) ([place])   ? PAF (paroxysmal atrial fibrillation) ([place])   ? Pancreatic mass 08/01/2018  ? Renal carcinoma ([place]) 06/16/2015  ? RLS (restless legs syndrome) 07/24/2016  ? Sleep apnea   ? Testicular hypofunction   ? Tubular adenoma 10/28/2017  ? Tussive syncopes 10/15/2013  ? Type 2 diabetes mellitus ([place])   ? Vitamin D deficiency disease   ? ? ?Past Surgical History:  ?Procedure Laterality Date  ? ADRENALECTOMY    ? CATARACT EXTRACTION  10/2014  ? CATARACT EXTRACTION EXTRACAPSULAR  11/05/2016  ? with Insertion Intraocular Prostheis.   ? COLONOSCOPY  05/06/2017  ? with removal lesions by snare.  ? COLONOSCOPY W/ BIOPSIES    ? 05/06/2017  ? NEPHRECTOMY    ? POPLITEAL SYNOVIAL CYST EXCISION    ? [place]  ? THYROIDECTOMY    ? THYROIDECTOMY, PARTIAL    ?  TONSILLECTOMY    ? ? ?There were no vitals filed for this visit. ? ? Subjective Assessment - 12/29/21 0851   ? ? Subjective Patient reports doing well today without any new complaints.   ? Patient is accompained by: Family member   wife - Tony Huffman  ? Pertinent History Patient is a 76 year old male with referral from Neurology- Dr. Joselyn Arrow for unspecified abnormality of gait. He presents today with his wife and reports > 6 month history of progressive issues with poor balance and multiple falls. Patient has renal Cancer and currently has Pallative care- Was stabilized out of Hospice. Patient presents with the following past medical history: A-Fib and flutter, Lung Cancer, Obstructive sleep apnea, Upper respiratory infection, Addison's disease, Hypothyroidism, Renal  Cancer, DM, gout, arthritis, Chronic kidney disease and Restless leg syndrome. Patient lives with wife in independent living at [facility]. Prior level of function was independent with transfers and mobility using walker yet some assist with dressing.   ? Limitations Lifting;Standing;Walking;House hold activities   ? How long can you sit comfortably? No issues   ? How long can you stand comfortably? <5 min   ? How long can you walk comfortably? <5 min   ? Diagnostic tests EXAM:  MRI HEAD WITHOUT CONTRAST     TECHNIQUE:  Multiplanar, multiecho pulse sequences of the brain and surrounding  structures were obtained without intravenous contrast.     COMPARISON:  CT head without contrast 01/20/2021     FINDINGS:  Brain: Moderate generalized atrophy is present. 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 Ventricles are enlarged  bilaterally. There is mild ventricular asymmetry, the right is  larger.     No acute infarct, hemorrhage, or mass lesion is present. No other  significant extra-axial collection is present.     The internal auditory canals are within normal limits. The brainstem  and cerebellum are within normal limits.     Vascular: Flow is present in the major intracranial arteries.     Skull and upper cervical spine: The craniocervical junction is  normal. Upper cervical spine is within normal limits. Marrow signal  is unremarkable.     Sinuses/Orbits: The paranasal sinuses and mastoid air cells are  clear. Bilateral lens replacements are noted. Globes and orbits are  otherwise unremarkable.     IMPRESSION:  1. No acute intracranial abnormality.  2. Moderate generalized atrophy and white matter disease likely  reflects the sequela of chronic microvascular ischemia.  3. Arachnoid cyst over the left convexity.   ? Patient Stated Goals I would like to walk better and not fall   ? Currently in Pain? No/denies   ? ?  ?  ? ?  ? ? ?INTERVENTIONS:  ? ?Therapeutic Exercises:  ? ?Precor Leg press-  ?40 lb 2  sets of 12 reps using B LE ?25lb 1 set of 12 reps single LE press (performed each leg) - rates- Easy ?40lb 1 set of 10 reps single LE press (performed each leg) - patient reported as moderate ?55lb 1 set of 8 reps single LE press (performed each LE) -Patient reported as med to hard.  ? ?Precor calf press- ?3 sets of 12 reps at 55lb (BLE)  ? ?Sit to stand from chair (with airex pad in seat) holding onto  dumbells each UE: ?1) 12 reps with 7# ?2) 10 reps with 8# ?3) 10 reps with 12# ? ?Donkey kicks- standing at support bar x 10 reps x 2  sets ? ?Standing hip march combo with hip ER x 10 reps x 2 sets ?Standing calf raise x 12 reps ?Standing toe raises x 12 reps ? ?Education provided throughout session via VC/TC and demonstration to facilitate movement at target joints and correct muscle activation for all testing and exercises performed.  ? ? ? ? ? ? ? ? ? ? ? ? ? ? ? ? ? ? ? ? ? ? ? ? PT Education - 12/29/21 0853   ? ? Education Details Exercise technique   ? Person(s) Educated Patient   ? Methods Explanation;Demonstration;Tactile cues;Verbal cues   ? Comprehension Verbalized understanding;Returned demonstration;Verbal cues required;Tactile cues required;Need further instruction   ? ?  ?  ? ?  ? ? ? PT Short Term Goals - 08/21/21 0814   ? ?  ? PT SHORT TERM GOAL #1  ? Title Pt will be independent with HEP in order to improve strength and balance in order to decrease fall risk and improve function at home and work.   ? Baseline 07/21/2021- Paitent presents with no formal HEP in place. 08/21/2021- Patient reports compliance with HEP and states no questions as of today- able to perform standin using his rollator for support.   ? Time 6   ? Period Weeks   ? Status Achieved   ? Target Date 09/01/21   ? ?  ?  ? ?  ? ? ? ? PT Long Term Goals - 12/14/21 0815   ? ?  ? PT LONG TERM GOAL #1  ? Title Pt will improve FOTO to target score of 53  to display perceived improvements in ability to complete ADL's.   ? Baseline  07/21/2021= 45; 1/5=51. 10/12/2021- Just reassessed for progress note so did not reassess again- will continue with goal into new certification 6/28: 36%   ? Time 12   ? Period Weeks   ? Status On-going   ? Target

## 2022-01-01 ENCOUNTER — Ambulatory Visit: Payer: Medicare Other | Attending: Neurology

## 2022-01-01 DIAGNOSIS — R278 Other lack of coordination: Secondary | ICD-10-CM | POA: Insufficient documentation

## 2022-01-01 DIAGNOSIS — R2681 Unsteadiness on feet: Secondary | ICD-10-CM | POA: Diagnosis present

## 2022-01-01 DIAGNOSIS — R262 Difficulty in walking, not elsewhere classified: Secondary | ICD-10-CM | POA: Diagnosis present

## 2022-01-01 DIAGNOSIS — M6281 Muscle weakness (generalized): Secondary | ICD-10-CM | POA: Diagnosis present

## 2022-01-01 DIAGNOSIS — R296 Repeated falls: Secondary | ICD-10-CM | POA: Diagnosis present

## 2022-01-01 DIAGNOSIS — R2689 Other abnormalities of gait and mobility: Secondary | ICD-10-CM | POA: Diagnosis present

## 2022-01-01 DIAGNOSIS — R269 Unspecified abnormalities of gait and mobility: Secondary | ICD-10-CM | POA: Insufficient documentation

## 2022-01-01 LAB — BASIC METABOLIC PANEL
BUN: 21 (ref 4–21)
CO2: 27 — AB (ref 13–22)
Chloride: 101 (ref 99–108)
Creatinine: 1.1 (ref 0.6–1.3)
Glucose: 182
Potassium: 5 mEq/L (ref 3.5–5.1)
Sodium: 136 — AB (ref 137–147)

## 2022-01-01 LAB — HEMOGLOBIN A1C: Hemoglobin A1C: 7.2

## 2022-01-01 LAB — COMPREHENSIVE METABOLIC PANEL: Calcium: 9.3 (ref 8.7–10.7)

## 2022-01-01 LAB — TSH: TSH: 3.18 (ref 0.41–5.90)

## 2022-01-01 NOTE — Therapy (Signed)
[place] ?[place], [place], [address] ?Phone: [phone]   Fax:  [phone] ? ?Physical Therapy Treatment ? ?Patient Details  ?Name: Tony Huffman ?MRN: [id number] ?Date of Birth: 01/30/1946 ?Referring Provider (PT): Dr. Joselyn Arrow ? ? ?Encounter Date: 01/01/2022 ? ? PT End of Session - 01/01/22 1058   ? ? Visit Number 39   ? Number of Visits 47   ? Date for PT Re-Evaluation 01/05/22   ? Authorization Type Medicare Part B; next session 1/10 PN 11/10/21   ? Authorization Time Period 07/21/2021- 10/13/2021; 10/13/2021-01/05/2022   ? Progress Note Due on Visit 50   ? PT Start Time 0845   ? PT Stop Time [phone]   ? PT Time Calculation (min) 43 min   ? Equipment Utilized During Treatment Gait belt   ? Activity Tolerance Patient tolerated treatment well   ? Behavior During Therapy [facility] for tasks assessed/performed   ? ?  ?  ? ?  ? ? ?Past Medical History:  ?Diagnosis Date  ? Abnormal heart rhythm 10/20/2014  ? Addison's disease ([place])   ? Adrenal insufficiency ([place])   ? Allergy   ? Ambulates with cane   ? Anxiety   ? Arrhythmia   ? Cataract cortical, senile   ? 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 Chronic kidney disease, stage 3 ([place]) 11/13/2011  ? Cough 03/29/2014  ? Edema leg 03/07/2017  ? Elevated cholesterol with high triglycerides 09/04/2013  ? Elevated red blood cell count   ? Erectile dysfunction   ? Falls frequently   ? Fever 03/29/2014  ? Generalized weakness 03/29/2014  ? GERD (gastroesophageal reflux disease)   ? Gout 09/04/2013  ? H/O adrenal insufficiency 05/17/2014  ? H/O atrial flutter 03/07/2017  ? H/O atrial flutter   ? H/O eye surgery   ? H/O sebaceous cyst 01/06/2018  ? H/O thyroid disease   ? H/O upper respiratory infection 10/15/2013  ? Hearing loss   ? History of back surgery   ? History of blepharoplasty   ? History of cancer 10/04/2015  ? History of chemotherapy   ? History of esophagogastroduodenoscopy (EGD)   ? History of prediabetes    ? Hyperlipidemia 06/16/2014  ? Hypertension   ? Hypertriglyceridemia   ? Hypothyroidism (acquired) 04/14/2014  ? Left leg swelling 11/23/2016  ? Low HDL (under 40)   ? 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[place]) 11/10/2010  ? Malignant neoplasm of unspecified kidney, except renal pelvis ([place])   ? OSA (obstructive sleep apnea) 07/24/2016  ? OSA (obstructive sleep apnea)   ? PAF (paroxysmal atrial fibrillation) ([place])   ? PAF (paroxysmal atrial fibrillation) ([place])   ? Pancreatic mass 08/01/2018  ? Renal carcinoma ([place]) 06/16/2015  ? RLS (restless legs syndrome) 07/24/2016  ? Sleep apnea   ? Testicular hypofunction   ? Tubular adenoma 10/28/2017  ? Tussive syncopes 10/15/2013  ? Type 2 diabetes mellitus ([place])   ? Vitamin D deficiency disease   ? ? ?Past Surgical History:  ?Procedure Laterality Date  ? ADRENALECTOMY    ? CATARACT EXTRACTION  10/2014  ? CATARACT EXTRACTION EXTRACAPSULAR  11/05/2016  ? with Insertion Intraocular Prostheis.   ? COLONOSCOPY  05/06/2017  ? with removal lesions by snare.  ? COLONOSCOPY W/ BIOPSIES    ? 05/06/2017  ? NEPHRECTOMY    ? POPLITEAL SYNOVIAL CYST EXCISION    ? [place]  ? THYROIDECTOMY    ? THYROIDECTOMY, PARTIAL    ?  TONSILLECTOMY    ? ? ?There were no vitals filed for this visit. ? ? Subjective Assessment - 01/01/22 1033   ? ? Subjective Patient reports having a good weekend- Denies any falls and no pain   ? Patient is accompained by: Family member   wife - [name]  ? Pertinent History Patient is a 76 year old male with referral from Neurology- Dr. Joselyn Arrow for unspecified abnormality of gait. He presents today with his wife and reports > 6 month history of progressive issues with poor balance and multiple falls. Patient has renal Cancer and currently has Pallative care- Was stabilized out of Hospice. Patient presents with the following past medical history: A-Fib and flutter, Lung Cancer, Obstructive sleep apnea, Upper respiratory infection, Addison's disease, Hypothyroidism,  Renal Cancer, DM, gout, arthritis, Chronic kidney disease and Restless leg syndrome. Patient lives with wife in independent living at [facility]. Prior level of function was independent with transfers and mobility using walker yet some assist with dressing.   ? Limitations Lifting;Standing;Walking;House hold activities   ? How long can you sit comfortably? No issues   ? How long can you stand comfortably? <5 min   ? How long can you walk comfortably? <5 min   ? Diagnostic tests EXAM:  MRI HEAD WITHOUT CONTRAST     TECHNIQUE:  Multiplanar, multiecho pulse sequences of the brain and surrounding  structures were obtained without intravenous contrast.     COMPARISON:  CT head without contrast 01/20/2021     FINDINGS:  Brain: Moderate generalized atrophy is present. 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 Ventricles are enlarged  bilaterally. There is mild ventricular asymmetry, the right is  larger.     No acute infarct, hemorrhage, or mass lesion is present. No other  significant extra-axial collection is present.     The internal auditory canals are within normal limits. The brainstem  and cerebellum are within normal limits.     Vascular: Flow is present in the major intracranial arteries.     Skull and upper cervical spine: The craniocervical junction is  normal. Upper cervical spine is within normal limits. Marrow signal  is unremarkable.     Sinuses/Orbits: The paranasal sinuses and mastoid air cells are  clear. Bilateral lens replacements are noted. Globes and orbits are  otherwise unremarkable.     IMPRESSION:  1. No acute intracranial abnormality.  2. Moderate generalized atrophy and white matter disease likely  reflects the sequela of chronic microvascular ischemia.  3. Arachnoid cyst over the left convexity.   ? Patient Stated Goals I would like to walk better and not fall   ? Currently in Pain? No/denies   ? ?  ?  ? ?  ? ? ?INTERVENTIONS:  ? ?Therapeutic Exercises: ? ?Seated knee ext with 4lb  with 5 sec hold- eccentric slow control back down ?Seated ham curl - GTB x 12 reps BLE ?Seated hip flex- Step tap onto 1/2 foam with 4lb AW- 20 reps - alt LE ?Seated hip flex/abd up and over cane- 4lb AW x 15 reps each.  ?Sit to stand with slow eccentric sit- hold 5 sec at bottom of squat just above seat of chair x 10 reps.  ? ? ?Neuromuscular re-ed:  ?Soccer kick with 4lb- Kicking ball against wall and laterally moving- (Using either leg as needed) x 3 min ?Soccer kick without AW- 3 min-  ?Standing Heel to gait sequencing using GTB around left ankle x 20 followed  by 20 more on right side.  ? ?Education provided throughout session via VC/TC and demonstration to facilitate movement at target joints and correct muscle activation for all testing and exercises performed.  ? ? ? ? ? ? ? ? ? ? ? ? ? ? ? ? ? ? ? ? ? ? ? ? ? ? ? PT Education - 01/01/22 1057   ? ? Education Details Exercise technique   ? Person(s) Educated Patient   ? Methods Explanation;Demonstration;Tactile cues;Verbal cues   ? Comprehension Verbalized understanding;Returned demonstration;Verbal cues required;Tactile cues required;Need further instruction   ? ?  ?  ? ?  ? ? ? PT Short Term Goals - 08/21/21 0814   ? ?  ? PT SHORT TERM GOAL #1  ? Title Pt will be independent with HEP in order to improve strength and balance in order to decrease fall risk and improve function at home and work.   ? Baseline 07/21/2021- Paitent presents with no formal HEP in place. 08/21/2021- Patient reports compliance with HEP and states no questions as of today- able to perform standin using his rollator for support.   ? Time 6   ? Period Weeks   ? Status Achieved   ? Target Date 09/01/21   ? ?  ?  ? ?  ? ? ? ? PT Long Term Goals - 12/14/21 0815   ? ?  ? PT LONG TERM GOAL #1  ? Title Pt will improve FOTO to target score of 53  to display perceived improvements in ability to complete ADL's.   ? Baseline 07/21/2021= 45; 1/5=51. 10/12/2021- Just reassessed for progress note so  did not reassess again- will continue with goal into new certification 9/41: 74%   ? Time 12   ? Period Weeks   ? Status On-going   ? Target Date 01/05/22   ?  ? PT LONG TERM GOAL #2  ? Title Pt will i

## 2022-01-05 ENCOUNTER — Ambulatory Visit: Payer: Medicare Other

## 2022-01-05 DIAGNOSIS — M6281 Muscle weakness (generalized): Secondary | ICD-10-CM

## 2022-01-05 DIAGNOSIS — R2681 Unsteadiness on feet: Secondary | ICD-10-CM

## 2022-01-05 DIAGNOSIS — R2689 Other abnormalities of gait and mobility: Secondary | ICD-10-CM

## 2022-01-05 DIAGNOSIS — R278 Other lack of coordination: Secondary | ICD-10-CM

## 2022-01-05 DIAGNOSIS — R296 Repeated falls: Secondary | ICD-10-CM

## 2022-01-05 DIAGNOSIS — R269 Unspecified abnormalities of gait and mobility: Secondary | ICD-10-CM

## 2022-01-05 DIAGNOSIS — R262 Difficulty in walking, not elsewhere classified: Secondary | ICD-10-CM | POA: Diagnosis not present

## 2022-01-05 NOTE — Therapy (Signed)
[place] [facility] ?[place], [place], [address] ?Phone: [phone]   Fax:  [phone] ? ?Physical Therapy Treatment/Recertification for dates 01/05/2022- 03/30/2022 ? ?Patient Details  ?Name: Tony Huffman ?MRN: [id number] ?Date of Birth: March 06, 1946 ?Referring Provider (PT): Dr. Joselyn Arrow ? ? ?Encounter Date: 01/05/2022 ? ? PT End of Session - 01/05/22 0850   ? ? Visit Number 77   ? Number of Visits 58   ? Date for PT Re-Evaluation 03/30/22   ? Authorization Type Medicare Part B; next session 1/10 PN 11/10/21   ? Authorization Time Period 07/21/2021- 10/13/2021; 3/00/9233-0/0/7622; Recert 03/03/3353-5/62/5638   ? Progress Note Due on Visit 50   ? PT Start Time 0845   ? PT Stop Time 9373   ? PT Time Calculation (min) 42 min   ? Equipment Utilized During Treatment Gait belt   ? Activity Tolerance Patient tolerated treatment well   ? Behavior During Therapy [facility] for tasks assessed/performed   ? ?  ?  ? ?  ? ? ?Past Medical History:  ?Diagnosis Date  ? Abnormal heart rhythm 10/20/2014  ? Addison's disease ([place])   ? Adrenal insufficiency ([place])   ? Allergy   ? Ambulates with cane   ? Anxiety   ? Arrhythmia   ? Cataract cortical, senile   ? 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 Chronic kidney disease, stage 3 ([place]) 11/13/2011  ? Cough 03/29/2014  ? Edema leg 03/07/2017  ? Elevated cholesterol with high triglycerides 09/04/2013  ? Elevated red blood cell count   ? Erectile dysfunction   ? Falls frequently   ? Fever 03/29/2014  ? Generalized weakness 03/29/2014  ? GERD (gastroesophageal reflux disease)   ? Gout 09/04/2013  ? H/O adrenal insufficiency 05/17/2014  ? H/O atrial flutter 03/07/2017  ? H/O atrial flutter   ? H/O eye surgery   ? H/O sebaceous cyst 01/06/2018  ? H/O thyroid disease   ? H/O upper respiratory infection 10/15/2013  ? Hearing loss   ? History of back surgery   ? History of blepharoplasty   ? History of cancer 10/04/2015  ? History of chemotherapy   ?  History of esophagogastroduodenoscopy (EGD)   ? History of prediabetes   ? Hyperlipidemia 06/16/2014  ? Hypertension   ? Hypertriglyceridemia   ? Hypothyroidism (acquired) 04/14/2014  ? Left leg swelling 11/23/2016  ? Low HDL (under 40)   ? 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[place]) 11/10/2010  ? Malignant neoplasm of unspecified kidney, except renal pelvis ([place])   ? OSA (obstructive sleep apnea) 07/24/2016  ? OSA (obstructive sleep apnea)   ? PAF (paroxysmal atrial fibrillation) ([place])   ? PAF (paroxysmal atrial fibrillation) ([place])   ? Pancreatic mass 08/01/2018  ? Renal carcinoma ([place]) 06/16/2015  ? RLS (restless legs syndrome) 07/24/2016  ? Sleep apnea   ? Testicular hypofunction   ? Tubular adenoma 10/28/2017  ? Tussive syncopes 10/15/2013  ? Type 2 diabetes mellitus ([place])   ? Vitamin D deficiency disease   ? ? ?Past Surgical History:  ?Procedure Laterality Date  ? ADRENALECTOMY    ? CATARACT EXTRACTION  10/2014  ? CATARACT EXTRACTION EXTRACAPSULAR  11/05/2016  ? with Insertion Intraocular Prostheis.   ? COLONOSCOPY  05/06/2017  ? with removal lesions by snare.  ? COLONOSCOPY W/ BIOPSIES    ? 05/06/2017  ? NEPHRECTOMY    ? POPLITEAL SYNOVIAL CYST EXCISION    ? [place]  ? THYROIDECTOMY    ?  THYROIDECTOMY, PARTIAL    ? TONSILLECTOMY    ? ? ?There were no vitals filed for this visit. ? ? Subjective Assessment - 01/05/22 1143   ? ? Subjective Patient reports having a good day so far and feels like the PT has really helped him walk better and stay on his feet.   ? Patient is accompained by: Family member   wife - [name]  ? Pertinent History Patient is a 76 year old male with referral from Neurology- Dr. Joselyn Arrow for unspecified abnormality of gait. He presents today with his wife and reports > 6 month history of progressive issues with poor balance and multiple falls. Patient has renal Cancer and currently has Pallative care- Was stabilized out of Hospice. Patient presents with the following past medical  history: A-Fib and flutter, Lung Cancer, Obstructive sleep apnea, Upper respiratory infection, Addison's disease, Hypothyroidism, Renal Cancer, DM, gout, arthritis, Chronic kidney disease and Restless leg syndrome. Patient lives with wife in independent living at [facility]. Prior level of function was independent with transfers and mobility using walker yet some assist with dressing.   ? Limitations Lifting;Standing;Walking;House hold activities   ? How long can you sit comfortably? No issues   ? How long can you stand comfortably? <5 min   ? How long can you walk comfortably? <5 min   ? Diagnostic tests EXAM:  MRI HEAD WITHOUT CONTRAST     TECHNIQUE:  Multiplanar, multiecho pulse sequences of the brain and surrounding  structures were obtained without intravenous contrast.     COMPARISON:  CT head without contrast 01/20/2021     FINDINGS:  Brain: Moderate generalized atrophy is present. 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 Ventricles are enlarged  bilaterally. There is mild ventricular asymmetry, the right is  larger.     No acute infarct, hemorrhage, or mass lesion is present. No other  significant extra-axial collection is present.     The internal auditory canals are within normal limits. The brainstem  and cerebellum are within normal limits.     Vascular: Flow is present in the major intracranial arteries.     Skull and upper cervical spine: The craniocervical junction is  normal. Upper cervical spine is within normal limits. Marrow signal  is unremarkable.     Sinuses/Orbits: The paranasal sinuses and mastoid air cells are  clear. Bilateral lens replacements are noted. Globes and orbits are  otherwise unremarkable.     IMPRESSION:  1. No acute intracranial abnormality.  2. Moderate generalized atrophy and white matter disease likely  reflects the sequela of chronic microvascular ischemia.  3. Arachnoid cyst over the left convexity.   ? Patient Stated Goals I would like to walk better and  not fall   ? Currently in Pain? No/denies   ? ?  ?  ? ?  ? ? ? ?INTERVENTIONS ? ? ?Reassessed remaining goals for recert visit. ? ?FOTO= 59% ? ?10 MWT= 0.56 m/s  ? ?6 min walk test= 495 feet using single point cane ? ?Manual muscle testing - Left LE = 4/5 hip flex/knee ext ? ? ?Up/down 4 steps with B rails x 2 trials- Supervision with VC to make sure patient places entire foot on step for safety with ascending.  ? ? ? ? ? ?   ? ? ? ? ? ? ? ? ? ? ? ? ? PT Education - 01/05/22 1143   ? ? Education Details Plan for recert of PT today.   ?  Person(s) Educated Patient   ? Methods Explanation;Demonstration;Tactile cues;Verbal cues   ? Comprehension Verbalized understanding;Tactile cues required;Returned demonstration;Verbal cues required;Need further instruction   ? ?  ?  ? ?  ? ? ? PT Short Term Goals - 08/21/21 0814   ? ?  ? PT SHORT TERM GOAL #1  ? Title Pt will be independent with HEP in order to improve strength and balance in order to decrease fall risk and improve function at home and work.   ? Baseline 07/21/2021- Paitent presents with no formal HEP in place. 08/21/2021- Patient reports compliance with HEP and states no questions as of today- able to perform standin using his rollator for support.   ? Time 6   ? Period Weeks   ? Status Achieved   ? Target Date 09/01/21   ? ?  ?  ? ?  ? ? ? ? PT Long Term Goals - 01/05/22 0851   ? ?  ? PT LONG TERM GOAL #1  ? Title Pt will improve FOTO to target score of 53  to display perceived improvements in ability to complete ADL's.   ? Baseline 07/21/2021= 45; 1/5=51. 10/12/2021- Just reassessed for progress note so did not reassess again- will continue with goal into new certification 9/62: 83%   ? Time 12   ? Period Weeks   ? Status Achieved   ? Target Date 03/30/22   ?  ? PT LONG TERM GOAL #2  ? Title Pt will improve BERG by at least 5 points in order to demonstrate clinically significant improvement in balance.   ? Baseline 07/21/2021= 39/56; 08/21/2021=46/56- Will keep  goal active to ensure patient able to test consistently; 10/05/2021= 52/56   ? Time 12   ? Period Weeks   ? Status Achieved   ? Target Date 10/13/21   ?  ? PT LONG TERM GOAL #3  ? Title Pt will decrease 5TSTS by a

## 2022-01-08 ENCOUNTER — Ambulatory Visit: Payer: Medicare Other

## 2022-01-08 ENCOUNTER — Other Ambulatory Visit: Payer: Medicare Other | Admitting: Student

## 2022-01-08 DIAGNOSIS — Z515 Encounter for palliative care: Secondary | ICD-10-CM

## 2022-01-08 DIAGNOSIS — C649 Malignant neoplasm of unspecified kidney, except renal pelvis: Secondary | ICD-10-CM

## 2022-01-08 DIAGNOSIS — R2689 Other abnormalities of gait and mobility: Secondary | ICD-10-CM

## 2022-01-08 DIAGNOSIS — W19XXXA Unspecified fall, initial encounter: Secondary | ICD-10-CM

## 2022-01-08 NOTE — Progress Notes (Signed)
? ? ?[employer] ?Community Palliative Care Consult Note ?Telephone: [phone]  ?Fax: [phone]  ? ? ?Date of encounter: 01/08/22 ?11:27 AM ?PATIENT NAME: Tony Huffman ?Tony Huffman [id number]   ?[phone] (home)  ?DOB: 01/26/1946 ?MRN: [id number] ?PRIMARY CARE PROVIDER:    ?[name], NP,  ?[address] ?[place] [address] ?[phone] ? ?REFERRING PROVIDER:   ?[name], NP ?[address] ?[place],   [address] ?[phone] ? ?RESPONSIBLE PARTY:    ?Contact Information   ? ? Name Relation Home Work Mobile  ? [employer], [place]   [phone]  ? ?  ? ? ? ?I met face to face with patient and family in the home. Palliative Care was asked to follow this patient by consultation request of  [name], NP to address advance care planning and complex medical decision making. This is a follow up visit. ? ?                                 ASSESSMENT AND PLAN / RECOMMENDATIONS:  ? ?Advance Care Planning/Goals of Care: Goals include to maximize quality of life and symptom management. Patient/health care surrogate gave his/her permission to discuss. ?Our advance care planning conversation included a discussion about:    ?The value and importance of advance care planning  ?Experiences with loved ones who have been seriously ill or have died  ?Exploration of personal, cultural or spiritual beliefs that might influence medical decisions  ?Exploration of goals of care in the event of a sudden injury or illness  ?CODE STATUS: DNR ? ?Education on palliative Medicine. Will continue to provide supportive care, symptom management.  ? ? ?Symptom Management/Plan: ? ?Movement disorder, abnormal gait, imbalance-currently receiving therapy. Patient reports fall today due to imbalance; no apparent injury. Continue therapy as directed. Use walker for ambulation; monitor for falls/safety. Encourage use of caregivers when wife is out of town d/t increased need  for adl support. Follow up with Neurology as directed. ? ?S/P fall-No apparent injury. Encourage use of walker for ambulation. Continue therapy as directed.  ? ?Metastatic renal carcinoma-wishes to continue on comfort path; patient previously on hospice but discharged due to stability. Education on palliative vs. Hospice. Will refer back to hospice should patient decline. ? ?Follow up Palliative Care Visit: Palliative care will continue to follow for complex medical decision making, advance care planning, and clarification of goals. Phone call in 4 weeks. Return in 8 weeks or prn. ? ? ?This visit was coded based on medical decision making (MDM). ? ?PPS: 50% ? ?HOSPICE ELIGIBILITY/DIAGNOSIS: TBD ? ?Chief Complaint: Palliative Medicine follow up visit.  ? ?HISTORY OF PRESENT ILLNESS:  Tony Huffman is a 76 y.o. year old male  with metastatic renal cell carcinoma, Addison's disease, polyneuropathy, T2DM, hyperlipidemia, hypertension, atrial fibrillation, vitamin D deficiency, OSA, RLS, chronic gouty arthritis.   ? ? ?Patient is still working with PT, frequency down to once a week. He reports having a fall just before NP arrival; he states he lost his balance. Denies pain, reports hitting right side of body and head. Reports needing some more assistance with adl's. Does have a pendant. Eating well; weight stable. Blood sugar 138 mg/dL; checking MWF mornings. A 10 point review of systems is negative, except for the pertinent positives and negatives detailed in the HPI. ? ?History obtained from review of EMR, discussion with primary team, and interview with  family, facility staff/caregiver and/or Tony Huffman.  ?I reviewed available labs, medications, imaging, studies and related documents from the EMR.  Records reviewed and summarized above.  ? ? ?Physical Exam: ?Pulse 78, resp 16, b/p 128/78, sats 96% on room air ?Constitutional: NAD ?General: frail appearing ?EYES: anicteric sclera, lids intact, no discharge  ?ENMT: intact  hearing, oral mucous membranes moist, dentition intact ?CV: S1S2, RRR, no LE edema ?Pulmonary: LCTA, no increased work of breathing, no cough, room air ?Abdomen: intake 100%, normo-active BS + 4 quadrants, soft and non tender, no ascites ?GU: deferred ?MSK: no sarcopenia, moves all extremities, ambulatory ?Skin: warm and dry, no rashes or wounds on visible skin ?Neuro:  +generalized weakness,  A & O x 3, forgetful. PERRLA ?Psych: non-anxious affect, pleasant ?Hem/lymph/immuno: no widespread bruising ? ? ?Thank you for the opportunity to participate in the care of Tony Huffman.  The palliative care team will continue to follow. Please call our office at [phone] if we can be of additional assistance.  ? ?Ezekiel Slocumb, NP  ? ?COVID-19 PATIENT SCREENING TOOL ?Asked and negative response unless otherwise noted:  ? ?Have you had symptoms of covid, tested positive or been in contact with someone with symptoms/positive test in the past 5-10 days? No ? ?

## 2022-01-12 ENCOUNTER — Ambulatory Visit: Payer: Medicare Other

## 2022-01-12 DIAGNOSIS — R262 Difficulty in walking, not elsewhere classified: Secondary | ICD-10-CM

## 2022-01-12 DIAGNOSIS — M6281 Muscle weakness (generalized): Secondary | ICD-10-CM

## 2022-01-12 DIAGNOSIS — R278 Other lack of coordination: Secondary | ICD-10-CM

## 2022-01-12 DIAGNOSIS — R2681 Unsteadiness on feet: Secondary | ICD-10-CM

## 2022-01-12 DIAGNOSIS — R296 Repeated falls: Secondary | ICD-10-CM

## 2022-01-12 DIAGNOSIS — R269 Unspecified abnormalities of gait and mobility: Secondary | ICD-10-CM

## 2022-01-12 DIAGNOSIS — R2689 Other abnormalities of gait and mobility: Secondary | ICD-10-CM

## 2022-01-12 NOTE — Therapy (Signed)
[facility] ?[facility] ?[place], [place], [address] ?Phone: [phone]   Fax:  [phone] ? ?Physical Therapy Treatment ? ?Patient Details  ?Name: Tony Huffman ?MRN: [id number] ?Date of Birth: 08-23-1946 ?Referring Provider (PT): Dr. Joselyn Arrow ? ? ?Encounter Date: 01/12/2022 ? ? PT End of Session - 01/12/22 1209   ? ? Visit Number 25   ? Number of Visits 58   ? Date for PT Re-Evaluation 03/30/22   ? Authorization Type Medicare Part B; next session 1/10 PN 11/10/21   ? Authorization Time Period 07/21/2021- 10/13/2021; 03/22/6332-02/02/5624; Recert 03/03/8936-3/42/8768   ? Progress Note Due on Visit 50   ? PT Start Time [phone]   ? PT Stop Time 0925   ? PT Time Calculation (min) 42 min   ? Equipment Utilized During Treatment Gait belt   ? Activity Tolerance Patient tolerated treatment well   ? Behavior During Therapy [facility] for tasks assessed/performed   ? ?  ?  ? ?  ? ? ?Past Medical History:  ?Diagnosis Date  ? Abnormal heart rhythm 10/20/2014  ? Addison's disease ([place])   ? Adrenal insufficiency ([place])   ? Allergy   ? Ambulates with cane   ? Anxiety   ? Arrhythmia   ? Cataract cortical, senile   ? 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 Chronic kidney disease, stage 3 ([place]) 11/13/2011  ? Cough 03/29/2014  ? Edema leg 03/07/2017  ? Elevated cholesterol with high triglycerides 09/04/2013  ? Elevated red blood cell count   ? Erectile dysfunction   ? Falls frequently   ? Fever 03/29/2014  ? Generalized weakness 03/29/2014  ? GERD (gastroesophageal reflux disease)   ? Gout 09/04/2013  ? H/O adrenal insufficiency 05/17/2014  ? H/O atrial flutter 03/07/2017  ? H/O atrial flutter   ? H/O eye surgery   ? H/O sebaceous cyst 01/06/2018  ? H/O thyroid disease   ? H/O upper respiratory infection 10/15/2013  ? Hearing loss   ? History of back surgery   ? History of blepharoplasty   ? History of cancer 10/04/2015  ? History of chemotherapy   ? History of esophagogastroduodenoscopy (EGD)    ? History of prediabetes   ? Hyperlipidemia 06/16/2014  ? Hypertension   ? Hypertriglyceridemia   ? Hypothyroidism (acquired) 04/14/2014  ? Left leg swelling 11/23/2016  ? Low HDL (under 40)   ? 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[place]) 11/10/2010  ? Malignant neoplasm of unspecified kidney, except renal pelvis ([place])   ? OSA (obstructive sleep apnea) 07/24/2016  ? OSA (obstructive sleep apnea)   ? PAF (paroxysmal atrial fibrillation) ([place])   ? PAF (paroxysmal atrial fibrillation) ([place])   ? Pancreatic mass 08/01/2018  ? Renal carcinoma ([place]) 06/16/2015  ? RLS (restless legs syndrome) 07/24/2016  ? Sleep apnea   ? Testicular hypofunction   ? Tubular adenoma 10/28/2017  ? Tussive syncopes 10/15/2013  ? Type 2 diabetes mellitus ([place])   ? Vitamin D deficiency disease   ? ? ?Past Surgical History:  ?Procedure Laterality Date  ? ADRENALECTOMY    ? CATARACT EXTRACTION  10/2014  ? CATARACT EXTRACTION EXTRACAPSULAR  11/05/2016  ? with Insertion Intraocular Prostheis.   ? COLONOSCOPY  05/06/2017  ? with removal lesions by snare.  ? COLONOSCOPY W/ BIOPSIES    ? 05/06/2017  ? NEPHRECTOMY    ? POPLITEAL SYNOVIAL CYST EXCISION    ? [place]  ? THYROIDECTOMY    ?  THYROIDECTOMY, PARTIAL    ? TONSILLECTOMY    ? ? ?There were no vitals filed for this visit. ? ? Subjective Assessment - 01/12/22 1208   ? ? Subjective Patient did report one fall this week- not sure what happened but fell in kitchen. Denies any injury or pain.   ? Patient is accompained by: Family member   wife - [name]  ? Pertinent History Patient is a 76 year old male with referral from Neurology- Dr. Joselyn Arrow for unspecified abnormality of gait. He presents today with his wife and reports > 6 month history of progressive issues with poor balance and multiple falls. Patient has renal Cancer and currently has Pallative care- Was stabilized out of Hospice. Patient presents with the following past medical history: A-Fib and flutter, Lung Cancer, Obstructive sleep  apnea, Upper respiratory infection, Addison's disease, Hypothyroidism, Renal Cancer, DM, gout, arthritis, Chronic kidney disease and Restless leg syndrome. Patient lives with wife in independent living at [facility]. Prior level of function was independent with transfers and mobility using walker yet some assist with dressing.   ? Limitations Lifting;Standing;Walking;House hold activities   ? How long can you sit comfortably? No issues   ? How long can you stand comfortably? <5 min   ? How long can you walk comfortably? <5 min   ? Diagnostic tests EXAM:  MRI HEAD WITHOUT CONTRAST     TECHNIQUE:  Multiplanar, multiecho pulse sequences of the brain and surrounding  structures were obtained without intravenous contrast.     COMPARISON:  CT head without contrast 01/20/2021     FINDINGS:  Brain: Moderate generalized atrophy is present. 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 Ventricles are enlarged  bilaterally. There is mild ventricular asymmetry, the right is  larger.     No acute infarct, hemorrhage, or mass lesion is present. No other  significant extra-axial collection is present.     The internal auditory canals are within normal limits. The brainstem  and cerebellum are within normal limits.     Vascular: Flow is present in the major intracranial arteries.     Skull and upper cervical spine: The craniocervical junction is  normal. Upper cervical spine is within normal limits. Marrow signal  is unremarkable.     Sinuses/Orbits: The paranasal sinuses and mastoid air cells are  clear. Bilateral lens replacements are noted. Globes and orbits are  otherwise unremarkable.     IMPRESSION:  1. No acute intracranial abnormality.  2. Moderate generalized atrophy and white matter disease likely  reflects the sequela of chronic microvascular ischemia.  3. Arachnoid cyst over the left convexity.   ? Patient Stated Goals I would like to walk better and not fall   ? Currently in Pain? No/denies   ? ?  ?  ? ?   ? ?INTERVENTIONS:  ? ?Therex:  ? ?Seated Ham curls BLE x 15 reps with GTB ? ?Seated LAQ BLE x 15 reps  with 3lb ? ?Seated hip flex/abd up/over orange hurdle  ? ? ?Standing step tap with sticky note positioned on left side of step block and instruction to make sure he lands his foot on the sticky note for improved accuracy of placement/proprioception for Left LE x 25 reps. Repeat on right side (patient approx twice as fast on right side going up)  ? ? ?Static stand on BOSU (curve side up) - attempted for several minutes ? ?Squats on BOSU (curve side up) with BUE support x 15  reps ? ?Ladder drills (forward and backward) x 10 reps each - VC to take increased overall step length  ? ?Ladder drills (side step) down and back x 5 - focusing on wide step. Patient was challenged with left LE yet did well with BUE Support and wide steps. ? ?Education provided throughout session via VC/TC and demonstration to facilitate movement at target joints and correct muscle activation for all testing and exercises performed.  ? ? ? ? ? ? ? ? ? ? ? ? ? ? ? ? ? ? ? ? ? PT Education - 01/12/22 1208   ? ? Education Details Exercise technique   ? Person(s) Educated Patient   ? Methods Explanation;Demonstration;Tactile cues;Verbal cues   ? Comprehension Verbalized understanding;Returned demonstration;Verbal cues required;Tactile cues required;Need further instruction   ? ?  ?  ? ?  ? ? ? PT Short Term Goals - 08/21/21 0814   ? ?  ? PT SHORT TERM GOAL #1  ? Title Pt will be independent with HEP in order to improve strength and balance in order to decrease fall risk and improve function at home and work.   ? Baseline 07/21/2021- Paitent presents with no formal HEP in place. 08/21/2021- Patient reports compliance with HEP and states no questions as of today- able to perform standin using his rollator for support.   ? Time 6   ? Period Weeks   ? Status Achieved   ? Target Date 09/01/21   ? ?  ?  ? ?  ? ? ? ? PT Long Term Goals - 01/05/22 0851    ? ?  ? PT LONG TERM GOAL #1  ? Title Pt will improve FOTO to target score of 53  to display perceived improvements in ability to complete ADL's.   ? Baseline 07/21/2021= 45; 1/5=51. 10/12/2021- Just Parke Poisson

## 2022-01-15 ENCOUNTER — Ambulatory Visit: Payer: Medicare Other

## 2022-01-19 ENCOUNTER — Ambulatory Visit: Payer: Medicare Other

## 2022-01-19 DIAGNOSIS — R2681 Unsteadiness on feet: Secondary | ICD-10-CM

## 2022-01-19 DIAGNOSIS — R278 Other lack of coordination: Secondary | ICD-10-CM

## 2022-01-19 DIAGNOSIS — R2689 Other abnormalities of gait and mobility: Secondary | ICD-10-CM

## 2022-01-19 DIAGNOSIS — M6281 Muscle weakness (generalized): Secondary | ICD-10-CM

## 2022-01-19 DIAGNOSIS — R262 Difficulty in walking, not elsewhere classified: Secondary | ICD-10-CM | POA: Diagnosis not present

## 2022-01-19 DIAGNOSIS — R296 Repeated falls: Secondary | ICD-10-CM

## 2022-01-19 DIAGNOSIS — R269 Unspecified abnormalities of gait and mobility: Secondary | ICD-10-CM

## 2022-01-19 NOTE — Therapy (Signed)
[facility] ?[facility] ?[place], [place], [address] ?Phone: [phone]   Fax:  [phone] ? ?Physical Therapy Treatment ? ?Patient Details  ?Name: Tony Huffman ?MRN: [id number] ?Date of Birth: 25-Aug-1946 ?Referring Provider (PT): Dr. Joselyn Arrow ? ? ?Encounter Date: 01/19/2022 ? ? PT End of Session - 01/19/22 0810   ? ? Visit Number 48   ? Number of Visits 58   ? Date for PT Re-Evaluation 03/30/22   ? Authorization Type Medicare Part B; next session 1/10 PN 11/10/21   ? Authorization Time Period 07/21/2021- 10/13/2021; 4/74/2595-03/04/8755; Recert 01/01/3294-1/88/4166   ? Progress Note Due on Visit 50   ? PT Start Time 0800   ? PT Stop Time 0845   ? PT Time Calculation (min) 45 min   ? Equipment Utilized During Treatment Gait belt   ? Activity Tolerance Patient tolerated treatment well   ? Behavior During Therapy [facility] for tasks assessed/performed   ? ?  ?  ? ?  ? ? ?Past Medical History:  ?Diagnosis Date  ? Abnormal heart rhythm 10/20/2014  ? 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 Adrenal insufficiency ([place])   ? Allergy   ? Ambulates with cane   ? Anxiety   ? Arrhythmia   ? Cataract cortical, senile   ? 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 Chronic kidney disease, stage 3 ([place]) 11/13/2011  ? Cough 03/29/2014  ? Edema leg 03/07/2017  ? Elevated cholesterol with high triglycerides 09/04/2013  ? Elevated red blood cell count   ? Erectile dysfunction   ? Falls frequently   ? Fever 03/29/2014  ? Generalized weakness 03/29/2014  ? GERD (gastroesophageal reflux disease)   ? Gout 09/04/2013  ? H/O adrenal insufficiency 05/17/2014  ? H/O atrial flutter 03/07/2017  ? H/O atrial flutter   ? H/O eye surgery   ? H/O sebaceous cyst 01/06/2018  ? H/O thyroid disease   ? H/O upper respiratory infection 10/15/2013  ? Hearing loss   ? History of back surgery   ? History of blepharoplasty   ? History of cancer 10/04/2015  ? History of chemotherapy   ? History of esophagogastroduodenoscopy (EGD)    ? History of prediabetes   ? Hyperlipidemia 06/16/2014  ? Hypertension   ? Hypertriglyceridemia   ? Hypothyroidism (acquired) 04/14/2014  ? Left leg swelling 11/23/2016  ? Low HDL (under 40)   ? 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[place]) 11/10/2010  ? Malignant neoplasm of unspecified kidney, except renal pelvis ([place])   ? OSA (obstructive sleep apnea) 07/24/2016  ? OSA (obstructive sleep apnea)   ? PAF (paroxysmal atrial fibrillation) ([place])   ? PAF (paroxysmal atrial fibrillation) ([place])   ? Pancreatic mass 08/01/2018  ? Renal carcinoma ([place]) 06/16/2015  ? RLS (restless legs syndrome) 07/24/2016  ? Sleep apnea   ? Testicular hypofunction   ? Tubular adenoma 10/28/2017  ? Tussive syncopes 10/15/2013  ? Type 2 diabetes mellitus ([place])   ? Vitamin D deficiency disease   ? ? ?Past Surgical History:  ?Procedure Laterality Date  ? ADRENALECTOMY    ? CATARACT EXTRACTION  10/2014  ? CATARACT EXTRACTION EXTRACAPSULAR  11/05/2016  ? with Insertion Intraocular Prostheis.   ? COLONOSCOPY  05/06/2017  ? with removal lesions by snare.  ? COLONOSCOPY W/ BIOPSIES    ? 05/06/2017  ? NEPHRECTOMY    ? POPLITEAL SYNOVIAL CYST EXCISION    ? [place]  ? THYROIDECTOMY    ?  THYROIDECTOMY, PARTIAL    ? TONSILLECTOMY    ? ? ?There were no vitals filed for this visit. ? ? Subjective Assessment - 01/19/22 0804   ? ? Subjective Patient reported that he has been on a few walks with his wife using his cane. He reports he did fairly well without any falls.   ? Patient is accompained by: Family member   wife - [name]  ? Pertinent History Patient is a 76 year old male with referral from Neurology- Dr. Joselyn Arrow for unspecified abnormality of gait. He presents today with his wife and reports > 6 month history of progressive issues with poor balance and multiple falls. Patient has renal Cancer and currently has Pallative care- Was stabilized out of Hospice. Patient presents with the following past medical history: A-Fib and flutter, Lung Cancer,  Obstructive sleep apnea, Upper respiratory infection, Addison's disease, Hypothyroidism, Renal Cancer, DM, gout, arthritis, Chronic kidney disease and Restless leg syndrome. Patient lives with wife in independent living at [facility]. Prior level of function was independent with transfers and mobility using walker yet some assist with dressing.   ? Limitations Lifting;Standing;Walking;House hold activities   ? How long can you sit comfortably? No issues   ? How long can you stand comfortably? <5 min   ? How long can you walk comfortably? <5 min   ? Diagnostic tests EXAM:  MRI HEAD WITHOUT CONTRAST     TECHNIQUE:  Multiplanar, multiecho pulse sequences of the brain and surrounding  structures were obtained without intravenous contrast.     COMPARISON:  CT head without contrast 01/20/2021     FINDINGS:  Brain: Moderate generalized atrophy is present. 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 Ventricles are enlarged  bilaterally. There is mild ventricular asymmetry, the right is  larger.     No acute infarct, hemorrhage, or mass lesion is present. No other  significant extra-axial collection is present.     The internal auditory canals are within normal limits. The brainstem  and cerebellum are within normal limits.     Vascular: Flow is present in the major intracranial arteries.     Skull and upper cervical spine: The craniocervical junction is  normal. Upper cervical spine is within normal limits. Marrow signal  is unremarkable.     Sinuses/Orbits: The paranasal sinuses and mastoid air cells are  clear. Bilateral lens replacements are noted. Globes and orbits are  otherwise unremarkable.     IMPRESSION:  1. No acute intracranial abnormality.  2. Moderate generalized atrophy and white matter disease likely  reflects the sequela of chronic microvascular ischemia.  3. Arachnoid cyst over the left convexity.   ? Patient Stated Goals I would like to walk better and not fall   ? Currently in Pain? No/denies    ? ?  ?  ? ?  ? ? ?INTERVENTIONS:  ? ?Heel to toe sequencing activity in // bars - using 4lb AW- one LE swings from toe off to heel strike (toe box landing on 1/2 foam) x 10 reps x each LE x 2 total sets.   Patient with decreased step length on left initially but did improve with reps. Rates as easy.  ? ? ?Stepping over 1/2 foam in // bars - initially with LLE and 4lb AW x 10 reps then starting with right LE x 10 more reps. Increased difficulty with coordination and strength on left side- increased time required and decreased overall balance requiring 1 UE support  at time. Patient rates as Hard.  ? ?Side step up to 6" wood box to right then down off box then back up to left and down x 10 reps each way using 4lb AW. Patient rates as medium ? ?Biodex TM: BUE Support  ?-with 4lb AW at 0.6 mph x 2 min 45 sec -0.04 mi ?- without weight at 0.6 mph x 3 min-0.05 mi ?*Patient reported stopping on both due to UE fatigue despite VC to try to only use UE for balance.  ? ?Forward/retro gait x 15 feet back and forth without an AD and no UE support x 8. VC to take a longer step with LE (both forward and backward for optimal reciprocal steps)  ? ? ? ? ? ? ? ? ? ? ? ? ? ? ? ? ? ? ? ? PT Education - 01/19/22 0805   ? ? Education Details Exercise technique   ? Person(s) Educated Patient   ? Methods Explanation;Demonstration;Tactile cues;Verbal cues   ? Comprehension Verbalized understanding;Returned demonstration;Verbal cues required;Tactile cues required;Need further instruction   ? ?  ?  ? ?  ? ? ? PT Short Term Goals - 08/21/21 0814   ? ?  ? PT SHORT TERM GOAL #1  ? Title Pt will be independent with HEP in order to improve strength and balance in order to decrease fall risk and improve function at home and work.   ? Baseline 07/21/2021- Paitent presents with no formal HEP in place. 08/21/2021- Patient reports compliance with HEP and states no questions as of today- able to perform standin using his rollator for support.   ? Time 6    ? Period Weeks   ? Status Achieved   ? Target Date 09/01/21   ? ?  ?  ? ?  ? ? ? ? PT Long Term Goals - 01/05/22 0851   ? ?  ? PT LONG TERM GOAL #1  ? Title Pt will improve FOTO to target score of 53

## 2022-01-22 ENCOUNTER — Ambulatory Visit: Payer: Medicare Other

## 2022-01-26 ENCOUNTER — Ambulatory Visit: Payer: Medicare Other

## 2022-01-26 DIAGNOSIS — R262 Difficulty in walking, not elsewhere classified: Secondary | ICD-10-CM | POA: Diagnosis not present

## 2022-01-26 DIAGNOSIS — R296 Repeated falls: Secondary | ICD-10-CM

## 2022-01-26 DIAGNOSIS — M6281 Muscle weakness (generalized): Secondary | ICD-10-CM

## 2022-01-26 DIAGNOSIS — R2681 Unsteadiness on feet: Secondary | ICD-10-CM

## 2022-01-26 DIAGNOSIS — R269 Unspecified abnormalities of gait and mobility: Secondary | ICD-10-CM

## 2022-01-26 DIAGNOSIS — R2689 Other abnormalities of gait and mobility: Secondary | ICD-10-CM

## 2022-01-26 DIAGNOSIS — R278 Other lack of coordination: Secondary | ICD-10-CM

## 2022-01-26 NOTE — Therapy (Signed)
[facility] ?[place], [place], [address] ?Phone: [phone]   Fax:  [phone] ? ?Physical Therapy Treatment ? ?Patient Details  ?Name: Tony Huffman ?MRN: [id number] ?Date of Birth: 1946-06-24 ?Referring Provider (PT): Dr. Joselyn Arrow ? ? ?Encounter Date: 01/26/2022 ? ? PT End of Session - 01/26/22 1115   ? ? Visit Number 63   ? Number of Visits 58   ? Date for PT Re-Evaluation 03/30/22   ? Authorization Type Medicare Part B; next session 1/10 PN 11/10/21   ? Authorization Time Period 07/21/2021- 10/13/2021; 01/12/2439-1/0/2725; Recert 12/05/6438-3/47/4259   ? Progress Note Due on Visit 50   ? PT Start Time [phone]   ? PT Stop Time [phone]   ? PT Time Calculation (min) 42 min   ? Equipment Utilized During Treatment Gait belt   ? Activity Tolerance Patient tolerated treatment well   ? Behavior During Therapy [facility] for tasks assessed/performed   ? ?  ?  ? ?  ? ? ?Past Medical History:  ?Diagnosis Date  ? Abnormal heart rhythm 10/20/2014  ? Addison's disease ([place])   ? Adrenal insufficiency ([place])   ? Allergy   ? Ambulates with cane   ? Anxiety   ? Arrhythmia   ? Cataract cortical, senile   ? 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 Chronic kidney disease, stage 3 ([place]) 11/13/2011  ? Cough 03/29/2014  ? Edema leg 03/07/2017  ? Elevated cholesterol with high triglycerides 09/04/2013  ? Elevated red blood cell count   ? Erectile dysfunction   ? Falls frequently   ? Fever 03/29/2014  ? Generalized weakness 03/29/2014  ? GERD (gastroesophageal reflux disease)   ? Gout 09/04/2013  ? H/O adrenal insufficiency 05/17/2014  ? H/O atrial flutter 03/07/2017  ? H/O atrial flutter   ? H/O eye surgery   ? H/O sebaceous cyst 01/06/2018  ? H/O thyroid disease   ? H/O upper respiratory infection 10/15/2013  ? Hearing loss   ? History of back surgery   ? History of blepharoplasty   ? History of cancer 10/04/2015  ? History of chemotherapy   ? History of esophagogastroduodenoscopy (EGD)    ? History of prediabetes   ? Hyperlipidemia 06/16/2014  ? Hypertension   ? Hypertriglyceridemia   ? Hypothyroidism (acquired) 04/14/2014  ? Left leg swelling 11/23/2016  ? Low HDL (under 40)   ? 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[place]) 11/10/2010  ? Malignant neoplasm of unspecified kidney, except renal pelvis ([place])   ? OSA (obstructive sleep apnea) 07/24/2016  ? OSA (obstructive sleep apnea)   ? PAF (paroxysmal atrial fibrillation) ([place])   ? PAF (paroxysmal atrial fibrillation) ([place])   ? Pancreatic mass 08/01/2018  ? Renal carcinoma ([place]) 06/16/2015  ? RLS (restless legs syndrome) 07/24/2016  ? Sleep apnea   ? Testicular hypofunction   ? Tubular adenoma 10/28/2017  ? Tussive syncopes 10/15/2013  ? Type 2 diabetes mellitus ([place])   ? Vitamin D deficiency disease   ? ? ?Past Surgical History:  ?Procedure Laterality Date  ? ADRENALECTOMY    ? CATARACT EXTRACTION  10/2014  ? CATARACT EXTRACTION EXTRACAPSULAR  11/05/2016  ? with Insertion Intraocular Prostheis.   ? COLONOSCOPY  05/06/2017  ? with removal lesions by snare.  ? COLONOSCOPY W/ BIOPSIES    ? 05/06/2017  ? NEPHRECTOMY    ? POPLITEAL SYNOVIAL CYST EXCISION    ? [place]  ? THYROIDECTOMY    ?  THYROIDECTOMY, PARTIAL    ? TONSILLECTOMY    ? ? ?There were no vitals filed for this visit. ? ? Subjective Assessment - 01/26/22 1113   ? ? Subjective Pt denying any falls. Spouse reports she still has concerns with pt furniture walking in home leaning anteriorly to hold onto things prematurely when they are far away.   ? Patient is accompained by: Family member   wife - [name]  ? Pertinent History Patient is a 76 year old male with referral from Neurology- Dr. Joselyn Arrow for unspecified abnormality of gait. He presents today with his wife and reports > 6 month history of progressive issues with poor balance and multiple falls. Patient has renal Cancer and currently has Pallative care- Was stabilized out of Hospice. Patient presents with the following past medical  history: A-Fib and flutter, Lung Cancer, Obstructive sleep apnea, Upper respiratory infection, Addison's disease, Hypothyroidism, Renal Cancer, DM, gout, arthritis, Chronic kidney disease and Restless leg syndrome. Patient lives with wife in independent living at [facility]. Prior level of function was independent with transfers and mobility using walker yet some assist with dressing.   ? Limitations Lifting;Standing;Walking;House hold activities   ? How long can you sit comfortably? No issues   ? How long can you stand comfortably? <5 min   ? How long can you walk comfortably? <5 min   ? Diagnostic tests EXAM:  MRI HEAD WITHOUT CONTRAST     TECHNIQUE:  Multiplanar, multiecho pulse sequences of the brain and surrounding  structures were obtained without intravenous contrast.     COMPARISON:  CT head without contrast 01/20/2021     FINDINGS:  Brain: Moderate generalized atrophy is present. 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 Ventricles are enlarged  bilaterally. There is mild ventricular asymmetry, the right is  larger.     No acute infarct, hemorrhage, or mass lesion is present. No other  significant extra-axial collection is present.     The internal auditory canals are within normal limits. The brainstem  and cerebellum are within normal limits.     Vascular: Flow is present in the major intracranial arteries.     Skull and upper cervical spine: The craniocervical junction is  normal. Upper cervical spine is within normal limits. Marrow signal  is unremarkable.     Sinuses/Orbits: The paranasal sinuses and mastoid air cells are  clear. Bilateral lens replacements are noted. Globes and orbits are  otherwise unremarkable.     IMPRESSION:  1. No acute intracranial abnormality.  2. Moderate generalized atrophy and white matter disease likely  reflects the sequela of chronic microvascular ischemia.  3. Arachnoid cyst over the left convexity.   ? Patient Stated Goals I would like to walk better and  not fall   ? Currently in Pain? No/denies   ? ?  ?  ? ?  ? ? ? ?There.ex:  ? ?Alternating Lateral step ups on 6" box with BUE support: x10/LE CGA ? ?Forwards/backwards hurdle (x2) step overs with BUE support: x6 laps in // bars. Frequent VC's for upright posture and reducing L circumduction on hip to clear hurdle with good carryover. CGA. ? ?Side steps over hurdle (x2) with BUE support: x6 laps in // bars. Decreased foot clearance on L foot leading to knocking over hurdles. CGA. ? ?  ? ?Gait training ? ?Biodex TM: BUE Support  ? ?No weights at .6 MPH for 2 minutes, 30 seconds. .4 MPH at 30 sec due to  slowed gait and heavy UE reliance. Pt requiring consistent VC's for improving upright posture and foot clearance (L >R). CGA ?  ?  With 4 lbs AW, 0.4 MPH with heavy focus on L foot clearance. Decreased speed compared to last session due to quicker fatigue from previous treatments. 2 minutes 30 sec. CGA. ? ? ?79' with SPC. Anterior trunk lean. VC's for upright posture and improved glut activation and L foot clerance during swing phase with fair carryover. CGA.  ? ? ?Forward/retro gait in // bars x6 laps back and forth without an AD and no UE support. VC to take a longer step with LE (both forward and backward for optimal reciprocal steps). CGA  ? ? ? ? ? ? PT Education - 01/26/22 1114   ? ? Education Details form/technique with exercise   ? Person(s) Educated Patient   ? Methods Explanation;Demonstration;Tactile cues;Verbal cues   ? Comprehension Verbalized understanding;Returned demonstration;Verbal cues required;Tactile cues required;Need further instruction   ? ?  ?  ? ?  ? ? ? PT Short Term Goals - 08/21/21 0814   ? ?  ? PT SHORT TERM GOAL #1  ? Title Pt will be independent with HEP in order to improve strength and balance in order to decrease fall risk and improve function at home and work.   ? Baseline 07/21/2021- Paitent presents with no formal HEP in place. 08/21/2021- Patient reports compliance with HEP and  states no questions as of today- able to perform standin using his rollator for support.   ? Time 6   ? Period Weeks   ? Status Achieved   ? Target Date 09/01/21   ? ?  ?  ? ?  ? ? ? ? PT Long Term Goals -

## 2022-01-29 ENCOUNTER — Ambulatory Visit: Payer: Medicare Other

## 2022-01-29 ENCOUNTER — Ambulatory Visit: Payer: Medicare Other | Attending: Neurology

## 2022-01-29 DIAGNOSIS — R2681 Unsteadiness on feet: Secondary | ICD-10-CM | POA: Diagnosis present

## 2022-01-29 DIAGNOSIS — R269 Unspecified abnormalities of gait and mobility: Secondary | ICD-10-CM | POA: Insufficient documentation

## 2022-01-29 DIAGNOSIS — R296 Repeated falls: Secondary | ICD-10-CM | POA: Insufficient documentation

## 2022-01-29 DIAGNOSIS — R262 Difficulty in walking, not elsewhere classified: Secondary | ICD-10-CM | POA: Insufficient documentation

## 2022-01-29 DIAGNOSIS — R278 Other lack of coordination: Secondary | ICD-10-CM | POA: Diagnosis present

## 2022-01-29 DIAGNOSIS — M6281 Muscle weakness (generalized): Secondary | ICD-10-CM | POA: Insufficient documentation

## 2022-01-29 DIAGNOSIS — R2689 Other abnormalities of gait and mobility: Secondary | ICD-10-CM | POA: Insufficient documentation

## 2022-01-29 NOTE — Therapy (Signed)
[facility] ?[place], [place], [address] ?Phone: [phone]   Fax:  [phone] ? ?Physical Therapy Treatment/Physical Therapy Progress Note ? ? ?Dates of reporting period  12/14/2021  to   01/29/2022 ? ?Patient Details  ?Name: Tony Huffman ?MRN: [id number] ?Date of Birth: 1946-05-05 ?Referring Provider (PT): Dr. Joselyn Arrow ? ? ?Encounter Date: 01/29/2022 ? ? PT End of Session - 01/29/22 0909   ? ? Visit Number 50   ? Number of Visits 58   ? Date for PT Re-Evaluation 03/30/22   ? Authorization Type Medicare Part B; next session 1/10 PN 11/10/21   ? Authorization Time Period 07/21/2021- 10/13/2021; 8/34/1962-11/02/9796; Recert 06/02/1193-1/74/0814   ? Progress Note Due on Visit 60   ? PT Start Time 1346   ? PT Stop Time 1426   ? PT Time Calculation (min) 40 min   ? Equipment Utilized During Treatment Gait belt   ? Activity Tolerance Patient tolerated treatment well   ? Behavior During Therapy [facility] for tasks assessed/performed   ? ?  ?  ? ?  ? ? ?Past Medical History:  ?Diagnosis Date  ? Abnormal heart rhythm 10/20/2014  ? Addison's disease ([place])   ? Adrenal insufficiency ([place])   ? Allergy   ? Ambulates with cane   ? Anxiety   ? Arrhythmia   ? Cataract cortical, senile   ? 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 Chronic kidney disease, stage 3 ([place]) 11/13/2011  ? Cough 03/29/2014  ? Edema leg 03/07/2017  ? Elevated cholesterol with high triglycerides 09/04/2013  ? Elevated red blood cell count   ? Erectile dysfunction   ? Falls frequently   ? Fever 03/29/2014  ? Generalized weakness 03/29/2014  ? GERD (gastroesophageal reflux disease)   ? Gout 09/04/2013  ? H/O adrenal insufficiency 05/17/2014  ? H/O atrial flutter 03/07/2017  ? H/O atrial flutter   ? H/O eye surgery   ? H/O sebaceous cyst 01/06/2018  ? H/O thyroid disease   ? H/O upper respiratory infection 10/15/2013  ? Hearing loss   ? History of back surgery   ? History of blepharoplasty   ? History of cancer  10/04/2015  ? History of chemotherapy   ? History of esophagogastroduodenoscopy (EGD)   ? History of prediabetes   ? Hyperlipidemia 06/16/2014  ? Hypertension   ? Hypertriglyceridemia   ? Hypothyroidism (acquired) 04/14/2014  ? Left leg swelling 11/23/2016  ? Low HDL (under 40)   ? 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[place]) 11/10/2010  ? Malignant neoplasm of unspecified kidney, except renal pelvis ([place])   ? OSA (obstructive sleep apnea) 07/24/2016  ? OSA (obstructive sleep apnea)   ? PAF (paroxysmal atrial fibrillation) ([place])   ? PAF (paroxysmal atrial fibrillation) ([place])   ? Pancreatic mass 08/01/2018  ? Renal carcinoma ([place]) 06/16/2015  ? RLS (restless legs syndrome) 07/24/2016  ? Sleep apnea   ? Testicular hypofunction   ? Tubular adenoma 10/28/2017  ? Tussive syncopes 10/15/2013  ? Type 2 diabetes mellitus ([place])   ? Vitamin D deficiency disease   ? ? ?Past Surgical History:  ?Procedure Laterality Date  ? ADRENALECTOMY    ? CATARACT EXTRACTION  10/2014  ? CATARACT EXTRACTION EXTRACAPSULAR  11/05/2016  ? with Insertion Intraocular Prostheis.   ? COLONOSCOPY  05/06/2017  ? with removal lesions by snare.  ? COLONOSCOPY W/ BIOPSIES    ? 05/06/2017  ? NEPHRECTOMY    ? POPLITEAL SYNOVIAL CYST  EXCISION    ? [place]  ? THYROIDECTOMY    ? THYROIDECTOMY, PARTIAL    ? TONSILLECTOMY    ? ? ?There were no vitals filed for this visit. ? ? Subjective Assessment - 01/29/22 0907   ? ? Subjective Patient reports having a rough morning but feeling better this afternoon.   ? Patient is accompained by: Family member   wife - [name]  ? Pertinent History Patient is a 76 year old male with referral from Neurology- Dr. Joselyn Arrow for unspecified abnormality of gait. He presents today with his wife and reports > 6 month history of progressive issues with poor balance and multiple falls. Patient has renal Cancer and currently has Pallative care- Was stabilized out of Hospice. Patient presents with the following past medical history:  A-Fib and flutter, Lung Cancer, Obstructive sleep apnea, Upper respiratory infection, Addison's disease, Hypothyroidism, Renal Cancer, DM, gout, arthritis, Chronic kidney disease and Restless leg syndrome. Patient lives with wife in independent living at [facility]. Prior level of function was independent with transfers and mobility using walker yet some assist with dressing.   ? Limitations Lifting;Standing;Walking;House hold activities   ? How long can you sit comfortably? No issues   ? How long can you stand comfortably? <5 min   ? How long can you walk comfortably? <5 min   ? Diagnostic tests EXAM:  MRI HEAD WITHOUT CONTRAST     TECHNIQUE:  Multiplanar, multiecho pulse sequences of the brain and surrounding  structures were obtained without intravenous contrast.     COMPARISON:  CT head without contrast 01/20/2021     FINDINGS:  Brain: Moderate generalized atrophy is present. 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 Ventricles are enlarged  bilaterally. There is mild ventricular asymmetry, the right is  larger.     No acute infarct, hemorrhage, or mass lesion is present. No other  significant extra-axial collection is present.     The internal auditory canals are within normal limits. The brainstem  and cerebellum are within normal limits.     Vascular: Flow is present in the major intracranial arteries.     Skull and upper cervical spine: The craniocervical junction is  normal. Upper cervical spine is within normal limits. Marrow signal  is unremarkable.     Sinuses/Orbits: The paranasal sinuses and mastoid air cells are  clear. Bilateral lens replacements are noted. Globes and orbits are  otherwise unremarkable.     IMPRESSION:  1. No acute intracranial abnormality.  2. Moderate generalized atrophy and white matter disease likely  reflects the sequela of chronic microvascular ischemia.  3. Arachnoid cyst over the left convexity.   ? Patient Stated Goals I would like to walk better and not fall    ? Currently in Pain? No/denies   ? ?  ?  ? ?  ? ? ?INTERVENTIONS:  ? ? ? ?Gait training ?  ?Biodex TM: BUE Support  ?  ?0.5-0.6 MPH for 3 minutes, 15 seconds. Pt requiring consistent VC's for improving upright posture and foot clearance (L >R). CGA ?             ? ?Patient performed another round at 5 min total- less overall VC for step sequencing.  ? ?Therex:  ? ?Single leg press - 25 lb. Left LE x 12 reps; 12 reps on right LE ?Progressed to 40 lb R then L LE x 12 reps each x 2 sets. Patient reported fatigue but  no significant difference- Right or Left.  ? ? ?6 min walk test= 470 feet in 4 min 30 sec ? ?Education provided throughout session via VC/TC and demonstration to facilitate movement at target joints and correct muscle activation for all testing and exercises performed.  ? ? ? ?     ? ? ? ? ? ? ? ? ? ? ? ? ? ? ? PT Education - 01/30/22 0908   ? ? Education Details Exercise technique   ? Person(s) Educated Patient   ? Methods Explanation;Demonstration;Tactile cues;Verbal cues   ? Comprehension Verbalized understanding;Returned demonstration;Tactile cues required;Verbal cues required;Need further instruction   ? ?  ?  ? ?  ? ? ? PT Short Term Goals - 08/21/21 0814   ? ?  ? PT SHORT TERM GOAL #1  ? Title Pt will be independent with HEP in order to improve strength and balance in order to decrease fall risk and improve function at home and work.   ? Baseline 07/21/2021- Paitent presents with no formal HEP in place. 08/21/2021- Patient reports compliance with HEP and states no questions as of today- able to perform standin using his rollator for support.   ? Time 6   ? Period Weeks   ? Status Achieved   ? Target Date 09/01/21   ? ?  ?  ? ?  ? ? ? ? PT Long Term Goals - 01/29/22 0910   ? ?  ? PT LONG TERM GOAL #1  ? Title Pt will improve FOTO to target score of 53  to display perceived improvements in ability to complete ADL's.   ? Baseline 07/21/2021= 45; 1/5=51. 10/12/2021- Just reassessed for progress note so  did not reassess again- will continue with goal into new certification 2/48: 25%   ? Time 12   ? Period Weeks   ? Status Achieved   ? Target Date 03/30/22   ?  ? PT LONG TERM GOAL #2  ? Title Pt will im

## 2022-02-02 ENCOUNTER — Ambulatory Visit: Payer: Medicare Other

## 2022-02-05 ENCOUNTER — Ambulatory Visit: Payer: Medicare Other

## 2022-02-07 ENCOUNTER — Ambulatory Visit: Payer: Medicare Other

## 2022-02-07 DIAGNOSIS — R2689 Other abnormalities of gait and mobility: Secondary | ICD-10-CM

## 2022-02-07 DIAGNOSIS — M6281 Muscle weakness (generalized): Secondary | ICD-10-CM

## 2022-02-07 DIAGNOSIS — R296 Repeated falls: Secondary | ICD-10-CM

## 2022-02-07 DIAGNOSIS — R262 Difficulty in walking, not elsewhere classified: Secondary | ICD-10-CM | POA: Diagnosis not present

## 2022-02-07 DIAGNOSIS — R2681 Unsteadiness on feet: Secondary | ICD-10-CM

## 2022-02-07 DIAGNOSIS — R269 Unspecified abnormalities of gait and mobility: Secondary | ICD-10-CM

## 2022-02-07 DIAGNOSIS — R278 Other lack of coordination: Secondary | ICD-10-CM

## 2022-02-07 NOTE — Therapy (Signed)
[place] [facility] ?[place], [place], [address] ?Phone: [phone]   Fax:  [phone] ? ?Physical Therapy Treatment ? ?Patient Details  ?Name: Tony Huffman ?MRN: [id number] ?Date of Birth: November 11, 1945 ?Referring Provider (PT): Dr. Joselyn Arrow ? ? ?Encounter Date: 02/07/2022 ? ? PT End of Session - 02/07/22 1604   ? ? Visit Number 51   ? Number of Visits 58   ? Date for PT Re-Evaluation 03/30/22   ? Authorization Type Medicare Part B; next session 1/10 PN 11/10/21   ? Authorization Time Period 07/21/2021- 10/13/2021; 2/50/5397-03/07/3418; Recert 12/06/9022-0/97/3532   ? Progress Note Due on Visit 60   ? PT Start Time 1600   ? PT Stop Time 1640   ? PT Time Calculation (min) 40 min   ? Equipment Utilized During Treatment Gait belt   ? Activity Tolerance Patient tolerated treatment well;No increased pain;Patient limited by fatigue   ? Behavior During Therapy [facility] for tasks assessed/performed   ? ?  ?  ? ?  ? ? ?Past Medical History:  ?Diagnosis Date  ? Abnormal heart rhythm 10/20/2014  ? Addison's disease ([place])   ? Adrenal insufficiency ([place])   ? Allergy   ? Ambulates with cane   ? Anxiety   ? Arrhythmia   ? Cataract cortical, senile   ? 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 Chronic kidney disease, stage 3 ([place]) 11/13/2011  ? Cough 03/29/2014  ? Edema leg 03/07/2017  ? Elevated cholesterol with high triglycerides 09/04/2013  ? Elevated red blood cell count   ? Erectile dysfunction   ? Falls frequently   ? Fever 03/29/2014  ? Generalized weakness 03/29/2014  ? GERD (gastroesophageal reflux disease)   ? Gout 09/04/2013  ? H/O adrenal insufficiency 05/17/2014  ? H/O atrial flutter 03/07/2017  ? H/O atrial flutter   ? H/O eye surgery   ? H/O sebaceous cyst 01/06/2018  ? H/O thyroid disease   ? H/O upper respiratory infection 10/15/2013  ? Hearing loss   ? History of back surgery   ? History of blepharoplasty   ? History of cancer 10/04/2015  ? History of chemotherapy   ?  History of esophagogastroduodenoscopy (EGD)   ? History of prediabetes   ? Hyperlipidemia 06/16/2014  ? Hypertension   ? Hypertriglyceridemia   ? Hypothyroidism (acquired) 04/14/2014  ? Left leg swelling 11/23/2016  ? Low HDL (under 40)   ? 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[place]) 11/10/2010  ? Malignant neoplasm of unspecified kidney, except renal pelvis ([place])   ? OSA (obstructive sleep apnea) 07/24/2016  ? OSA (obstructive sleep apnea)   ? PAF (paroxysmal atrial fibrillation) ([place])   ? PAF (paroxysmal atrial fibrillation) ([place])   ? Pancreatic mass 08/01/2018  ? Renal carcinoma ([place]) 06/16/2015  ? RLS (restless legs syndrome) 07/24/2016  ? Sleep apnea   ? Testicular hypofunction   ? Tubular adenoma 10/28/2017  ? Tussive syncopes 10/15/2013  ? Type 2 diabetes mellitus ([place])   ? Vitamin D deficiency disease   ? ? ?Past Surgical History:  ?Procedure Laterality Date  ? ADRENALECTOMY    ? CATARACT EXTRACTION  10/2014  ? CATARACT EXTRACTION EXTRACAPSULAR  11/05/2016  ? with Insertion Intraocular Prostheis.   ? COLONOSCOPY  05/06/2017  ? with removal lesions by snare.  ? COLONOSCOPY W/ BIOPSIES    ? 05/06/2017  ? NEPHRECTOMY    ? POPLITEAL SYNOVIAL CYST EXCISION    ? [place]  ?  THYROIDECTOMY    ? THYROIDECTOMY, PARTIAL    ? TONSILLECTOMY    ? ? ?There were no vitals filed for this visit. ? ? Subjective Assessment - 02/07/22 1601   ? ? Subjective Pt reports getting covid booster yesterday, has some HA there after and some today. Pt says he has been wlaking daily with his wife. No other update.   ? Pertinent History Patient is a 76 year old male with referral from Neurology- Dr. Joselyn Arrow for unspecified abnormality of gait. He presents today with his wife and reports > 6 month history of progressive issues with poor balance and multiple falls. Patient has renal Cancer and currently has Pallative care- Was stabilized out of Hospice. Patient presents with the following past medical history: A-Fib and flutter, Lung  Cancer, Obstructive sleep apnea, Upper respiratory infection, Addison's disease, Hypothyroidism, Renal Cancer, DM, gout, arthritis, Chronic kidney disease and Restless leg syndrome. Patient lives with wife in independent living at [facility]. Prior level of function was independent with transfers and mobility using walker yet some assist with dressing.   ? Diagnostic tests EXAM:  MRI HEAD WITHOUT CONTRAST     TECHNIQUE:  Multiplanar, multiecho pulse sequences of the brain and surrounding  structures were obtained without intravenous contrast.     COMPARISON:  CT head without contrast 01/20/2021     FINDINGS:  Brain: Moderate generalized atrophy is present. 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 Ventricles are enlarged  bilaterally. There is mild ventricular asymmetry, the right is  larger.     No acute infarct, hemorrhage, or mass lesion is present. No other  significant extra-axial collection is present.     The internal auditory canals are within normal limits. The brainstem  and cerebellum are within normal limits.     Vascular: Flow is present in the major intracranial arteries.     Skull and upper cervical spine: The craniocervical junction is  normal. Upper cervical spine is within normal limits. Marrow signal  is unremarkable.     Sinuses/Orbits: The paranasal sinuses and mastoid air cells are  clear. Bilateral lens replacements are noted. Globes and orbits are  otherwise unremarkable.     IMPRESSION:  1. No acute intracranial abnormality.  2. Moderate generalized atrophy and white matter disease likely  reflects the sequela of chronic microvascular ischemia.  3. Arachnoid cyst over the left convexity.   ? Pain Score 3    ? Pain Location --   HA  ? ?  ?  ? ?  ? ? ?INTERVENTION THIS DATE:  ?-AMB overground 311f c SPC RUE, 2-point pattern, unable to maintain safe LLE placement and foot clearance after 2053f ?-16042fMB Left AFO (coming out of [place] sandal)  ?-LLE Marching 2x15 ?-LLE Ankle DF  2x15 c 4lb AW  ?-Bilat SAQ 1x15x3secH bilat, 1x15x3secH ayt 7.5lb  ? ? PT Education - 02/07/22 1629   ? ? Education Details use and nonuse of AFO   ? Person(s) Educated Patient   ? Methods Explanation   ? Comprehension Verbalized understanding;Returned demonstration   ? ?  ?  ? ?  ? ? ? PT Short Term Goals - 08/21/21 0814   ? ?  ? PT SHORT TERM GOAL #1  ? Title Pt will be independent with HEP in order to improve strength and balance in order to decrease fall risk and improve function at home and work.   ? Baseline 07/21/2021- Paitent presents with no formal HEP  in place. 08/21/2021- Patient reports compliance with HEP and states no questions as of today- able to perform standin using his rollator for support.   ? Time 6   ? Period Weeks   ? Status Achieved   ? Target Date 09/01/21   ? ?  ?  ? ?  ? ? ? ? PT Long Term Goals - 01/29/22 0910   ? ?  ? PT LONG TERM GOAL #1  ? Title Pt will improve FOTO to target score of 53  to display perceived improvements in ability to complete ADL's.   ? Baseline 07/21/2021= 45; 1/5=51. 10/12/2021- Just reassessed for progress note so did not reassess again- will continue with goal into new certification 0/07: 62%   ? Time 12   ? Period Weeks   ? Status Achieved   ? Target Date 03/30/22   ?  ? PT LONG TERM GOAL #2  ? Title Pt will improve BERG by at least 5 points in order to demonstrate clinically significant improvement in balance.   ? Baseline 07/21/2021= 39/56; 08/21/2021=46/56- Will keep goal active to ensure patient able to test consistently; 10/05/2021= 52/56   ? Time 12   ? Period Weeks   ? Status Achieved   ? Target Date 10/13/21   ?  ? PT LONG TERM GOAL #3  ? Title Pt will decrease 5TSTS by at least 5 seconds in order to demonstrate clinically significant improvement in LE strength.   ? Baseline 07/21/2021= 21 sec without UE Support; 11/21= 14.0 sec without UE support-.Will Keep goal active to ensure consistency.  10/05/2021=14.75 sec without UE support  (improved from 21 sec and  unchanged from last assessment of 14 sec)   ? Time 12   ? Period Weeks   ? Status Achieved   ? Target Date 10/13/21   ?  ? PT LONG TERM GOAL #4  ? Title Pt will decrease TUG to below  18 seconds/decreas

## 2022-02-09 ENCOUNTER — Ambulatory Visit: Payer: Medicare Other

## 2022-02-12 ENCOUNTER — Ambulatory Visit: Payer: Medicare Other

## 2022-02-13 ENCOUNTER — Encounter: Payer: Self-pay | Admitting: Nurse Practitioner

## 2022-02-13 ENCOUNTER — Ambulatory Visit (INDEPENDENT_AMBULATORY_CARE_PROVIDER_SITE_OTHER): Payer: Medicare Other | Admitting: Nurse Practitioner

## 2022-02-13 ENCOUNTER — Telehealth: Payer: Self-pay

## 2022-02-13 ENCOUNTER — Encounter: Payer: Medicare Other | Admitting: Nurse Practitioner

## 2022-02-13 DIAGNOSIS — Z0000 Encounter for general adult medical examination without abnormal findings: Secondary | ICD-10-CM | POA: Diagnosis not present

## 2022-02-13 NOTE — Patient Instructions (Signed)
Tony Huffman , ?Thank you for taking time to come for your Medicare Wellness Visit. I appreciate your ongoing commitment to your health goals. Please review the following plan we discussed and let me know if I can assist you in the future.  ? ?Screening recommendations/referrals: ?Colonoscopy aged out ?Recommended yearly ophthalmology/optometry visit for glaucoma screening and checkup ?Recommended yearly dental visit for hygiene and checkup ? ?Vaccinations: ?Influenza vaccine up to date ?Pneumococcal vaccine up to date ?Tdap vaccine up to date ?Shingles vaccine DUE- recommend to get at your local pharmacy      ? ?Advanced directives: on file. ? ?Conditions/risks identified: advanced age, fall risk.  ? ?Next appointment: yearly for AWV ? ?Preventive Care 70 Years and Older, Male ?Preventive care refers to lifestyle choices and visits with your health care provider that can promote health and wellness. ?What does preventive care include? ?A yearly physical exam. This is also called an annual well check. ?Dental exams once or twice a year. ?Routine eye exams. Ask your health care provider how often you should have your eyes checked. ?Personal lifestyle choices, including: ?Daily care of your teeth and gums. ?Regular physical activity. ?Eating a healthy diet. ?Avoiding tobacco and drug use. ?Limiting alcohol use. ?Practicing safe sex. ?Taking low doses of aspirin every day. ?Taking vitamin and mineral supplements as recommended by your health care provider. ?What happens during an annual well check? ?The services and screenings done by your health care provider during your annual well check will depend on your age, overall health, lifestyle risk factors, and family history of disease. ?Counseling  ?Your health care provider may ask you questions about your: ?Alcohol use. ?Tobacco use. ?Drug use. ?Emotional well-being. ?Home and relationship well-being. ?Sexual activity. ?Eating habits. ?History of falls. ?Memory and  ability to understand (cognition). ?Work and work Statistician. ?Screening  ?You may have the following tests or measurements: ?Height, weight, and BMI. ?Blood pressure. ?Lipid and cholesterol levels. These may be checked every 5 years, or more frequently if you are over 60 years old. ?Skin check. ?Lung cancer screening. You may have this screening every year starting at age 71 if you have a 30-pack-year history of smoking and currently smoke or have quit within the past 15 years. ?Fecal occult blood test (FOBT) of the stool. You may have this test every year starting at age 29. ?Flexible sigmoidoscopy or colonoscopy. You may have a sigmoidoscopy every 5 years or a colonoscopy every 10 years starting at age 60. ?Prostate cancer screening. Recommendations will vary depending on your family history and other risks. ?Hepatitis C blood test. ?Hepatitis B blood test. ?Sexually transmitted disease (STD) testing. ?Diabetes screening. This is done by checking your blood sugar (glucose) after you have not eaten for a while (fasting). You may have this done every 1-3 years. ?Abdominal aortic aneurysm (AAA) screening. You may need this if you are a current or former smoker. ?Osteoporosis. You may be screened starting at age 64 if you are at high risk. ?Talk with your health care provider about your test results, treatment options, and if necessary, the need for more tests. ?Vaccines  ?Your health care provider may recommend certain vaccines, such as: ?Influenza vaccine. This is recommended every year. ?Tetanus, diphtheria, and acellular pertussis (Tdap, Td) vaccine. You may need a Td booster every 10 years. ?Zoster vaccine. You may need this after age 67. ?Pneumococcal 13-valent conjugate (PCV13) vaccine. One dose is recommended after age 74. ?Pneumococcal polysaccharide (PPSV23) vaccine. One dose is recommended after age 84. ?  Talk to your health care provider about which screenings and vaccines you need and how often you need  them. ?This information is not intended to replace advice given to you by your health care provider. Make sure you discuss any questions you have with your health care provider. ?Document Released: 10/14/2015 Document Revised: 06/06/2016 Document Reviewed: 07/19/2015 ?Elsevier Interactive Patient Education ? 2017 [place]. ? ?Fall Prevention in the Home ?Falls can cause injuries. They can happen to people of all ages. There are many things you can do to make your home safe and to help prevent falls. ?What can I do on the outside of my home? ?Regularly fix the edges of walkways and driveways and fix any cracks. ?Remove anything that might make you trip as you walk through a door, such as a raised step or threshold. ?Trim any bushes or trees on the path to your home. ?Use bright outdoor lighting. ?Clear any walking paths of anything that might make someone trip, such as rocks or tools. ?Regularly check to see if handrails are loose or broken. Make sure that both sides of any steps have handrails. ?Any raised decks and porches should have guardrails on the edges. ?Have any leaves, snow, or ice cleared regularly. ?Use sand or salt on walking paths during winter. ?Clean up any spills in your garage right away. This includes oil or grease spills. ?What can I do in the bathroom? ?Use night lights. ?Install grab bars by the toilet and in the tub and shower. Do not use towel bars as grab bars. ?Use non-skid mats or decals in the tub or shower. ?If you need to sit down in the shower, use a plastic, non-slip stool. ?Keep the floor dry. Clean up any water that spills on the floor as soon as it happens. ?Remove soap buildup in the tub or shower regularly. ?Attach bath mats securely with double-sided non-slip rug tape. ?Do not have throw rugs and other things on the floor that can make you trip. ?What can I do in the bedroom? ?Use night lights. ?Make sure that you have a light by your bed that is easy to reach. ?Do not use  any sheets or blankets that are too big for your bed. They should not hang down onto the floor. ?Have a firm chair that has side arms. You can use this for support while you get dressed. ?Do not have throw rugs and other things on the floor that can make you trip. ?What can I do in the kitchen? ?Clean up any spills right away. ?Avoid walking on wet floors. ?Keep items that you use a lot in easy-to-reach places. ?If you need to reach something above you, use a strong step stool that has a grab bar. ?Keep electrical cords out of the way. ?Do not use floor polish or wax that makes floors slippery. If you must use wax, use non-skid floor wax. ?Do not have throw rugs and other things on the floor that can make you trip. ?What can I do with my stairs? ?Do not leave any items on the stairs. ?Make sure that there are handrails on both sides of the stairs and use them. Fix handrails that are broken or loose. Make sure that handrails are as long as the stairways. ?Check any carpeting to make sure that it is firmly attached to the stairs. Fix any carpet that is loose or worn. ?Avoid having throw rugs at the top or bottom of the stairs. If you do have throw  rugs, attach them to the floor with carpet tape. ?Make sure that you have a light switch at the top of the stairs and the bottom of the stairs. If you do not have them, ask someone to add them for you. ?What else can I do to help prevent falls? ?Wear shoes that: ?Do not have high heels. ?Have rubber bottoms. ?Are comfortable and fit you well. ?Are closed at the toe. Do not wear sandals. ?If you use a stepladder: ?Make sure that it is fully opened. Do not climb a closed stepladder. ?Make sure that both sides of the stepladder are locked into place. ?Ask someone to hold it for you, if possible. ?Clearly mark and make sure that you can see: ?Any grab bars or handrails. ?First and last steps. ?Where the edge of each step is. ?Use tools that help you move around (mobility aids)  if they are needed. These include: ?Canes. ?Walkers. ?Scooters. ?Crutches. ?Turn on the lights when you go into a dark area. Replace any light bulbs as soon as they burn out. ?Set up your furniture so you hav

## 2022-02-13 NOTE — Progress Notes (Signed)
This service is provided via telemedicine ? ?No vital signs collected/recorded due to the encounter was a telemedicine visit.  ? ?Location of patient (ex: home, work):  Home ? ?Patient consents to a telephone visit:  Yes, see encounter dated 02/13/2022 ? ?Location of the provider (ex: office, home):  [place] ? ?Name of any referring provider:  N/A ? ?Names of all persons participating in the telemedicine service and their role in the encounter:  Sherrie Mustache, Nurse Practitioner, [name], CMA, and patient.  ? ?Time spent on call:  12 minutes with medical assistant ? ?

## 2022-02-13 NOTE — Telephone Encounter (Signed)
I made 4 unsuccessful attempts to reach patient to conduct AWV. Called patient and an automated message came on stating could not complete call. ? ?1st-11:00 am ?2nd-11:06 am ?3rd-11:16  am ?4th 11:20 am ?

## 2022-02-13 NOTE — Progress Notes (Signed)
? ?Subjective:  ? Tony Huffman is a 76 y.o. male who presents for Medicare Annual/Subsequent preventive examination. ? ?Review of Systems    ? ?Cardiac Risk Factors include: hypertension;diabetes mellitus;sedentary lifestyle;advanced age (>30mn, >>83women);male gender ? ?   ?Objective:  ?  ?There were no vitals filed for this visit. ?There is no height or weight on file to calculate BMI. ? ? ?  02/13/2022  ?  2:21 PM 10/10/2021  ? 11:10 AM 07/21/2021  ? 10:22 AM 02/09/2021  ?  1:48 PM 01/20/2021  ? 11:37 AM 01/12/2020  ?  8:45 AM  ?Advanced Directives  ?Does Patient Have a Medical Advance Directive? _0  Yes  ?Type of Advance Directive Out of facility DNR (pink MOST or yellow form) Out of facility DNR (pink MOST or yellow form) [place];Out of facility DNR (pink MOST or yellow form) Out of facility DNR (pink MOST or yellow form) Out of facility DNR (pink MOST or yellow form) Out of facility DNR (pink MOST or yellow form);Living will;Healthcare Power of Attorney  ?Does patient want to make changes to medical advance directive? No - Patient declined No - Patient declined No - Patient declined No - Patient declined No - Patient declined No - Patient declined  ?[place] of [place]?   No - copy requested   No - copy requested  ?Pre-existing out of facility DNR order (yellow form or pink MOST form) Pink MOST form placed in chart (order not valid for inpatient use);Yellow form placed in chart (order not valid for inpatient use) Pink MOST form placed in chart (order not valid for inpatient use);Yellow form placed in chart (order not valid for inpatient use)  Pink MOST form placed in chart (order not valid for inpatient use);Yellow form placed in chart (order not valid for inpatient use)    ? ? ?Current Medications (verified) ?Outpatient Encounter Medications as of 02/13/2022  ?Medication Sig  ? Accu-Chek Softclix Lancets lancets Use to check blood sugar  once daily. Dx: E11.49  ? blood glucose meter kit and supplies 1 each by Other route as directed. Dispense based on patient and insurance preference. Use up to four times daily as directed. (FOR ICD-10 E10.9, E11.9).  ? Cholecalciferol 125 MCG (5000 UT) capsule Take 5,000 Units by mouth daily.  ? colchicine 0.6 MG tablet Take 1 tablet (0.6 mg total) by mouth as needed. First sign of Gout Flare. Take 2 tablets ( 1.215m by mouth at first sign of gout flare followed by 1 tablet ( 0.16m50mafter 1 hour. ( max 1.8mg74mthin 1 hours)  ? cyanocobalamin 1000 MCG tablet Take 1,000 mcg by mouth every other day.  ? Fludrocortisone Acetate (FLORINEF PO) Take 0.1 mg by mouth every other day. On alternate days take 1/2 tablet.  ? furosemide (LASIX) 20 MG tablet Take 20 mg by mouth as needed. For Edema.  ? gabapentin (NEURONTIN) 600 MG tablet Take 900 mg by mouth daily. Takes 1 and 1/2 tablet by mouth nightly.  ? glucose blood (ACCU-CHEK AVIVA PLUS) test strip Use to test blood sugar once daily. Dx: E11.49  ? hydrocortisone (CORTEF) 10 MG tablet Take by mouth daily. Takes 1 and 1/2 tablet every morning, 1/2 tablet every afternoon, and 1/2 tablet every evening. Take double dose as directed for stress, may take up to 85 tablets monthly.  ? levothyroxine (SYNTHROID) 75 MCG tablet Take 75 mcg by mouth daily.  ? lidocaine (XYLOCAINE) 5 %  ointment Apply 1 application topically as needed.  ? metFORMIN (GLUMETZA) 500 MG (MOD) 24 hr tablet Take 500 mg by mouth daily.  ? Multiple Vitamins-Minerals (PRESERVISION AREDS 2 PO) Take 1 capsule by mouth in the morning and at bedtime.  ? nortriptyline (PAMELOR) 10 MG capsule 2 nightly  ? predniSONE (STERAPRED UNI-PAK 21 TAB) 10 MG (21) TBPK tablet Use as directed  ? PSYLLIUM HUSK PO Take 3.4 g by mouth every evening.  ? ?No facility-administered encounter medications on file as of 02/13/2022.  ? ? ?Allergies (verified) ?Other, Tegaderm ag mesh [silver], and Tape  ? ?History: ?Past Medical History:   ?Diagnosis Date  ? Abnormal heart rhythm 10/20/2014  ? Addison's disease ([place])   ? Adrenal insufficiency ([place])   ? Allergy   ? Ambulates with cane   ? Anxiety   ? Arrhythmia   ? Cataract cortical, senile   ? 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 Chronic kidney disease, stage 3 ([place]) 11/13/2011  ? Cough 03/29/2014  ? Edema leg 03/07/2017  ? Elevated cholesterol with high triglycerides 09/04/2013  ? Elevated red blood cell count   ? Erectile dysfunction   ? Falls frequently   ? Fever 03/29/2014  ? Generalized weakness 03/29/2014  ? GERD (gastroesophageal reflux disease)   ? Gout 09/04/2013  ? H/O adrenal insufficiency 05/17/2014  ? H/O atrial flutter 03/07/2017  ? H/O atrial flutter   ? H/O eye surgery   ? H/O sebaceous cyst 01/06/2018  ? H/O thyroid disease   ? H/O upper respiratory infection 10/15/2013  ? Hearing loss   ? History of back surgery   ? History of blepharoplasty   ? History of cancer 10/04/2015  ? History of chemotherapy   ? History of esophagogastroduodenoscopy (EGD)   ? History of prediabetes   ? Hyperlipidemia 06/16/2014  ? Hypertension   ? Hypertriglyceridemia   ? Hypothyroidism (acquired) 04/14/2014  ? Left leg swelling 11/23/2016  ? Low HDL (under 40)   ? 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[place]) 11/10/2010  ? Malignant neoplasm of unspecified kidney, except renal pelvis ([place])   ? OSA (obstructive sleep apnea) 07/24/2016  ? OSA (obstructive sleep apnea)   ? PAF (paroxysmal atrial fibrillation) ([place])   ? PAF (paroxysmal atrial fibrillation) ([place])   ? Pancreatic mass 08/01/2018  ? Renal carcinoma ([place]) 06/16/2015  ? RLS (restless legs syndrome) 07/24/2016  ? Sleep apnea   ? Testicular hypofunction   ? Tubular adenoma 10/28/2017  ? Tussive syncopes 10/15/2013  ? Type 2 diabetes mellitus ([place])   ? Vitamin D deficiency disease   ? ?Past Surgical History:  ?Procedure Laterality Date  ? ADRENALECTOMY    ? CATARACT EXTRACTION  10/2014  ? CATARACT EXTRACTION EXTRACAPSULAR  11/05/2016  ? with Insertion  Intraocular Prostheis.   ? COLONOSCOPY  05/06/2017  ? with removal lesions by snare.  ? COLONOSCOPY W/ BIOPSIES    ? 05/06/2017  ? NEPHRECTOMY    ? POPLITEAL SYNOVIAL CYST EXCISION    ? [place]  ? THYROIDECTOMY    ? THYROIDECTOMY, PARTIAL    ? TONSILLECTOMY    ? ?Family History  ?Problem Relation Age of Onset  ? Ovarian cancer Mother   ? Cancer Mother   ? Cancer Father   ? Heart disease Father   ? Coronary artery disease Father   ? Hypertension Father   ? Macular degeneration Father   ? Heart disease Brother   ? Diabetes Brother   ? Coronary artery  disease Brother   ? Diabetes type II Brother   ? Coronary artery disease Paternal Grandfather   ? ?Social History  ? ?Socioeconomic History  ? Marital status: Married  ?  Spouse name: Not on file  ? Number of children: Not on file  ? Years of education: Not on file  ? Highest education level: Not on file  ?Occupational History  ? Not on file  ?Tobacco Use  ? Smoking status: Former  ?  Packs/day: 1.00  ?  Years: 10.00  ?  Pack years: 10.00  ?  Types: Cigarettes  ?  Quit date: 62  ?  Years since quitting: 45.4  ? Smokeless tobacco: Never  ?Vaping Use  ? Vaping Use: Never used  ?Substance and Sexual Activity  ? Alcohol use: Not Currently  ? Drug use: Never  ? Sexual activity: Not on file  ?Other Topics Concern  ? Not on file  ?Social History Narrative  ? Tobacco use, amount per day now: None.  ? Past tobacco use, amount per day: 1 pack a day.  ? How many years did you use tobacco: 10   ? Alcohol use (drinks per week): None  ? Diet: Vegetarian.   ? Do you drink/eat things with caffeine: No  ? Marital status: Married                                 What year were you married? 2002  ? Do you live in a house, apartment, assisted living, condo, trailer, etc.? House  ? Is it one or more stories? One  ? How many persons live in your home? 2  ? Do you have pets in your home?( please list)   ? Highest Level of education completed: Bachelors in Music.  ? Current or past  profession: Scientist, research (physical sciences).  ? Do you exercise? Yes                                    ? Type and how often? Balance   ? Do you have a living will? Yes  ? Do you have a DNR form?  Yes

## 2022-02-13 NOTE — Telephone Encounter (Signed)
Mr. mychal, decarlo are scheduled for a virtual visit with your provider today.   ? ?Just as we do with appointments in the office, we must obtain your consent to participate.  Your consent will be active for this visit and any virtual visit you may have with one of our providers in the next 365 days.   ? ?If you have a MyChart account, I can also send a copy of this consent to you electronically.  All virtual visits are billed to your insurance company just like a traditional visit in the office.  As this is a virtual visit, video technology does not allow for your provider to perform a traditional examination.  This may limit your provider's ability to fully assess your condition.  If your provider identifies any concerns that need to be evaluated in person or the need to arrange testing such as labs, EKG, etc, we will make arrangements to do so.   ? ?Although advances in technology are sophisticated, we cannot ensure that it will always work on either your end or our end.  If the connection with a video visit is poor, we may have to switch to a telephone visit.  With either a video or telephone visit, we are not always able to ensure that we have a secure connection.   I need to obtain your verbal consent now.   Are you willing to proceed with your visit today?  ? ?Tony Huffman has provided verbal consent on 02/13/2022 for a virtual visit (video or telephone). ? ? ?[name], CMA ?02/13/2022  2:27 PM ?  ?

## 2022-02-14 NOTE — Progress Notes (Signed)
err

## 2022-02-16 ENCOUNTER — Ambulatory Visit: Payer: Medicare Other

## 2022-02-16 DIAGNOSIS — R296 Repeated falls: Secondary | ICD-10-CM

## 2022-02-16 DIAGNOSIS — R269 Unspecified abnormalities of gait and mobility: Secondary | ICD-10-CM

## 2022-02-16 DIAGNOSIS — R262 Difficulty in walking, not elsewhere classified: Secondary | ICD-10-CM | POA: Diagnosis not present

## 2022-02-16 DIAGNOSIS — M6281 Muscle weakness (generalized): Secondary | ICD-10-CM

## 2022-02-16 DIAGNOSIS — R2681 Unsteadiness on feet: Secondary | ICD-10-CM

## 2022-02-16 DIAGNOSIS — R2689 Other abnormalities of gait and mobility: Secondary | ICD-10-CM

## 2022-02-16 DIAGNOSIS — R278 Other lack of coordination: Secondary | ICD-10-CM

## 2022-02-16 NOTE — Therapy (Signed)
[facility] [address] [place], [place], [address] Phone: [phone]   Fax:  [phone]  Physical Therapy Treatment  Patient Details  Name: Tony Huffman MRN: [id number] Date of Birth: 11/22/1945 Referring Provider (PT): Dr. H. Shah   Encounter Date: 02/16/2022   PT End of Session - 02/16/22 0851     Visit Number 52    Number of Visits 58    Date for PT Re-Evaluation 03/30/22    Authorization Type Medicare Part B; next session 1/10 PN 11/10/21    Authorization Time Period 01/05/2022-03/30/2022    Progress Note Due on Visit 60    PT Start Time 0845    PT Stop Time 0925    PT Time Calculation (min) 40 min    Equipment Utilized During Treatment Gait belt    Activity Tolerance Patient tolerated treatment well;No increased pain;Patient limited by fatigue    Behavior During Therapy WFL for tasks assessed/performed             Past Medical History:  Diagnosis Date   Abnormal heart rhythm 10/20/2014   Addison's disease (HCC)    Adrenal insufficiency (HCC)    Allergy    Ambulates with cane    Anxiety    Arrhythmia    Cataract cortical, senile    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   Chronic kidney disease, stage 3 (HCC) 11/13/2011   Cough 03/29/2014   Edema leg 03/07/2017   Elevated cholesterol with high triglycerides 09/04/2013   Elevated red blood cell count    Erectile dysfunction    Falls frequently    Fever 03/29/2014   Generalized weakness 03/29/2014   GERD (gastroesophageal reflux disease)    Gout 09/04/2013   H/O adrenal insufficiency 05/17/2014   H/O atrial flutter 03/07/2017   H/O atrial flutter    H/O eye surgery    H/O sebaceous cyst 01/06/2018   H/O thyroid disease    H/O upper respiratory infection 10/15/2013   Hearing loss    History of back surgery    History of blepharoplasty    History of cancer 10/04/2015   History of chemotherapy    History of esophagogastroduodenoscopy (EGD)     History of prediabetes    Hyperlipidemia 06/16/2014   Hypertension    Hypertriglyceridemia    Hypothyroidism (acquired) 04/14/2014   Left leg swelling 11/23/2016   Low HDL (under 40)    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HCC) 11/10/2010   Malignant neoplasm of unspecified kidney, except renal pelvis (HCC)    OSA (obstructive sleep apnea) 07/24/2016   OSA (obstructive sleep apnea)    PAF (paroxysmal atrial fibrillation) (HCC)    PAF (paroxysmal atrial fibrillation) (HCC)    Pancreatic mass 08/01/2018   Renal carcinoma (HCC) 06/16/2015   RLS (restless legs syndrome) 07/24/2016   Sleep apnea    Testicular hypofunction    Tubular adenoma 10/28/2017   Tussive syncopes 10/15/2013   Type 2 diabetes mellitus (HCC)    Vitamin D deficiency disease     Past Surgical History:  Procedure Laterality Date   ADRENALECTOMY     CATARACT EXTRACTION  10/2014   CATARACT EXTRACTION EXTRACAPSULAR  11/05/2016   with Insertion Intraocular Prostheis.    COLONOSCOPY  05/06/2017   with removal lesions by snare.   COLONOSCOPY W/ BIOPSIES     05/06/2017   NEPHRECTOMY     POPLITEAL SYNOVIAL CYST EXCISION     SPINE SURGERY  1989   THYROIDECTOMY       THYROIDECTOMY, PARTIAL     TONSILLECTOMY      There were no vitals filed for this visit.   Subjective Assessment - 02/16/22 0849     Subjective Pt doing well today. No updates since last session.    Pertinent History Patient is a 76 year old male with referral from Neurology- Dr. H. Shah for unspecified abnormality of gait. He presents today with his wife and reports > 6 month history of progressive issues with poor balance and multiple falls. Patient has renal Cancer and currently has Pallative care- Was stabilized out of Hospice. Patient presents with the following past medical history: A-Fib and flutter, Lung Cancer, Obstructive sleep apnea, Upper respiratory infection, Addison's disease, Hypothyroidism, Renal Cancer, DM, gout, arthritis, Chronic kidney  disease and Restless leg syndrome. Patient lives with wife in independent living at [facility]. Prior level of function was independent with transfers and mobility using walker yet some assist with dressing.    Currently in Pain? No/denies             INTERVENTION THIS DATE:  -AMB 150ft 10 AW drap from left thigh -soccer ball kicking Left x15 -AMB 150ft 20 AW drap from left thigh -soccer ball kicking Left x15  -half foam lateral step over with 4lb AW LLE x20, 1x20 fwd, retro LLE leading   -STS from chair x8, dynadisc under Rt foot  -lateral step up over and down 1x10 bilat     PT Education - 02/16/22 0917     Education Details Explained how tactile cues improve mobility potential    Person(s) Educated Patient    Methods Explanation;Demonstration    Comprehension Need further instruction;Tactile cues required;Verbalized understanding              PT Short Term Goals - 08/21/21 0814       PT SHORT TERM GOAL #1   Title Pt will be independent with HEP in order to improve strength and balance in order to decrease fall risk and improve function at home and work.    Baseline 07/21/2021- Paitent presents with no formal HEP in place. 08/21/2021- Patient reports compliance with HEP and states no questions as of today- able to perform standin using his rollator for support.    Time 6    Period Weeks    Status Achieved    Target Date 09/01/21               PT Long Term Goals - 01/29/22 0910       PT LONG TERM GOAL #1   Title Pt will improve FOTO to target score of 53  to display perceived improvements in ability to complete ADL's.    Baseline 07/21/2021= 45; 1/5=51. 10/12/2021- Just reassessed for progress note so did not reassess again- will continue with goal into new certification 2/10: 49%    Time 12    Period Weeks    Status Achieved    Target Date 03/30/22      PT LONG TERM GOAL #2   Title Pt will improve BERG by at least 5 points in order to demonstrate  clinically significant improvement in balance.    Baseline 07/21/2021= 39/56; 08/21/2021=46/56- Will keep goal active to ensure patient able to test consistently; 10/05/2021= 52/56    Time 12    Period Weeks    Status Achieved    Target Date 10/13/21      PT LONG TERM GOAL #3   Title Pt will decrease 5TSTS by at least   5 seconds in order to demonstrate clinically significant improvement in LE strength.    Baseline 07/21/2021= 21 sec without UE Support; 11/21= 14.0 sec without UE support-.Will Keep goal active to ensure consistency.  10/05/2021=14.75 sec without UE support  (improved from 21 sec and unchanged from last assessment of 14 sec)    Time 12    Period Weeks    Status Achieved    Target Date 10/13/21      PT LONG TERM GOAL #4   Title Pt will decrease TUG to below  18 seconds/decrease in order to demonstrate decreased fall risk.    Baseline 07/21/2021= 23 sec with 4WW; 08/21/2021= 22.13 sec with 4WW; 10/05/2021= 19.51 sec avg with 4WW 2/10: 15.4 seconds with cane  12/14/2021= 18.0 sec    Time 12    Period Weeks    Status Achieved    Target Date 01/05/22      PT LONG TERM GOAL #5   Title Pt will increase 10MWT by at least 0.23 m/s in order to demonstrate clinically significant improvement in community ambulation.    Baseline 07/21/2021= 0.43m/s; 08/21/2021= 0.57 m/s using 4WW; 10/05/2021= 0.6 m/s avg using 4WW 2/10: 0.51 m/s with cane. 12/14/2021= 0.65 m/s using SPC    Time 12    Period Weeks    Status Partially Met    Target Date 03/30/22      PT LONG TERM GOAL #6   Title Patient will increase six minute walk test distance to > 100 for progression to community ambulator and improve gait ability    Baseline 01/05/2022= 495 feet using SPC; 01/29/2022= 470 feet in 4 min 30 sec using SPC    Time 12    Period Weeks    Status On-going    Target Date 03/30/22                   Plan - 02/16/22 0917     Clinical Impression Statement Continued with cues for attending to LLE in gait  and prolonged activities. Pt loses attention to limb overtime which results in decline in gross motor control, can be regained with cues for attention again.    Personal Factors and Comorbidities Comorbidity 3+    Comorbidities A- Fib, Renal cancer, Addison's disease, gout, arthritis, DM    Examination-Activity Limitations Caring for Others;Carry;Dressing;Lift;Reach Overhead;Squat;Stairs;Transfers;Stand    Examination-Participation Restrictions Community Activity;Cleaning;Driving;Yard Work;Shop    Stability/Clinical Decision Making Evolving/Moderate complexity    Clinical Decision Making Moderate    Rehab Potential Good    PT Frequency 1x / week    PT Duration 12 weeks    PT Treatment/Interventions ADLs/Self Care Home Management;Canalith Repostioning;Cryotherapy;Electrical Stimulation;Moist Heat;Traction;Ultrasound;DME Instruction;Gait training;Stair training;Functional mobility training;Therapeutic activities;Therapeutic exercise;Balance training;Neuromuscular re-education;Patient/family education;Orthotic Fit/Training;Wheelchair mobility training;Manual techniques;Passive range of motion;Dry needling;Energy conservation;Taping;Vestibular    PT Next Visit Plan Continue with progress LE strengthening; gait training for safe mobility, Neuromuscular re-ed for improved overall balance.  Continue plan of care as previously indicated    PT Home Exercise Plan 08/17/2021-Access Code: [email or web address]; no updates    Consulted and Agree with Plan of Care Patient;Family member/caregiver    Family Member Consulted WIfe- [name]             Patient will benefit from skilled therapeutic intervention in order to improve the following deficits and impairments:  Abnormal gait, Decreased activity tolerance, Decreased balance, Decreased coordination, Decreased endurance, Decreased knowledge of use of DME, Decreased mobility, Decreased strength, Difficulty walking, Hypomobility, Impaired sensation, Impaired  vision/preception, Postural dysfunction,   Pain  Visit Diagnosis: Difficulty in walking, not elsewhere classified  Muscle weakness (generalized)  Abnormality of gait and mobility  Other abnormalities of gait and mobility  Other lack of coordination  Unsteadiness on feet  Repeated falls     Problem List Patient Active Problem List   Diagnosis Date Noted   Addisons disease (HCC) 03/16/2021   Low HDL (under 40) 03/16/2021   Difficulty sleeping 11/21/2020   Numbness and tingling 11/21/2020   Difficulty walking 01/14/2020   History of recent fall 01/14/2020   Sleep disorder 01/14/2020   Pancreatic mass 08/01/2018   H/O sebaceous cyst 01/06/2018   Tubular adenoma 10/28/2017   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   Edema leg 03/07/2017   Hx of atrial flutter 03/07/2017   OSA (obstructive sleep apnea) 07/24/2016   RLS (restless legs syndrome) 07/24/2016   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   Atrial fibrillation and flutter (HCC) 10/20/2014   Hyperlipidemia 06/16/2014   Adrenal crisis (HCC) 04/19/2014   Cough 03/29/2014   Weakness 03/29/2014   Cough syncope 10/15/2013   URI (upper respiratory infection) 10/15/2013   Dyslipidemia 09/04/2013   Gout 09/04/2013   Chronic kidney disease, stage III (moderate) (HCC) 11/13/2011   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   Hypothyroidism (acquired) 11/30/2010   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HCC) 11/10/2010   9:32 AM, 02/16/22 Allan C Buccola, PT, DPT Physical Therapist - [facility]  Outpatient Physical Therapy- Main Campus [phone]     Buccola,Allan C, PT 02/16/2022, 9:22 AM  [facility] [address] [place], , [address] Phone: [phone]   Fax:  [phone]  Name: Tony Huffman MRN: [id number] Date of Birth: 04/16/1946    

## 2022-02-19 ENCOUNTER — Ambulatory Visit: Payer: Medicare Other

## 2022-02-23 ENCOUNTER — Ambulatory Visit: Payer: Medicare Other

## 2022-02-23 ENCOUNTER — Telehealth: Payer: Self-pay | Admitting: Student

## 2022-02-23 DIAGNOSIS — R262 Difficulty in walking, not elsewhere classified: Secondary | ICD-10-CM

## 2022-02-23 DIAGNOSIS — R278 Other lack of coordination: Secondary | ICD-10-CM

## 2022-02-23 DIAGNOSIS — R2689 Other abnormalities of gait and mobility: Secondary | ICD-10-CM

## 2022-02-23 DIAGNOSIS — R2681 Unsteadiness on feet: Secondary | ICD-10-CM

## 2022-02-23 DIAGNOSIS — R296 Repeated falls: Secondary | ICD-10-CM

## 2022-02-23 DIAGNOSIS — M6281 Muscle weakness (generalized): Secondary | ICD-10-CM

## 2022-02-23 DIAGNOSIS — R269 Unspecified abnormalities of gait and mobility: Secondary | ICD-10-CM

## 2022-02-23 NOTE — Therapy (Signed)
[place] MAIN [facility] SERVICES [address] [place], [place], [address] Phone: [phone]   Fax:  [phone]  Physical Therapy Treatment  Patient Details  Name: Tony Huffman MRN: [id number] Date of Birth: 1946-07-12 Referring Provider (PT): Dr. Joselyn Arrow   Encounter Date: 02/23/2022   PT End of Session - 02/23/22 0852     Visit Number 58    Number of Visits 14    Date for PT Re-Evaluation 03/30/22    Authorization Type Medicare Part B; next session 1/10 PN 11/10/21    Authorization Time Period 01/05/2022-03/30/2022    Progress Note Due on Visit 60    PT Start Time 0845    PT Stop Time 0928    PT Time Calculation (min) 43 min    Equipment Utilized During Treatment Gait belt    Activity Tolerance Patient tolerated treatment well;No increased pain;Patient limited by fatigue    Behavior During Therapy [facility] for tasks assessed/performed             Past Medical History:  Diagnosis Date   Abnormal heart rhythm 10/20/2014   Addison's disease ([place])    Adrenal insufficiency ([place])    Allergy    Ambulates with cane    Anxiety    Arrhythmia    Cataract cortical, senile    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   Chronic kidney disease, stage 3 ([place]) 11/13/2011   Cough 03/29/2014   Edema leg 03/07/2017   Elevated cholesterol with high triglycerides 09/04/2013   Elevated red blood cell count    Erectile dysfunction    Falls frequently    Fever 03/29/2014   Generalized weakness 03/29/2014   GERD (gastroesophageal reflux disease)    Gout 09/04/2013   H/O adrenal insufficiency 05/17/2014   H/O atrial flutter 03/07/2017   H/O atrial flutter    H/O eye surgery    H/O sebaceous cyst 01/06/2018   H/O thyroid disease    H/O upper respiratory infection 10/15/2013   Hearing loss    History of back surgery    History of blepharoplasty    History of cancer 10/04/2015   History of chemotherapy    History of esophagogastroduodenoscopy (EGD)     History of prediabetes    Hyperlipidemia 06/16/2014   Hypertension    Hypertriglyceridemia    Hypothyroidism (acquired) 04/14/2014   Left leg swelling 11/23/2016   Low HDL (under 40)    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[place]) 11/10/2010   Malignant neoplasm of unspecified kidney, except renal pelvis (HCC)    OSA (obstructive sleep apnea) 07/24/2016   OSA (obstructive sleep apnea)    PAF (paroxysmal atrial fibrillation) (HCC)    PAF (paroxysmal atrial fibrillation) ([place])    Pancreatic mass 08/01/2018   Renal carcinoma ([place]) 06/16/2015   RLS (restless legs syndrome) 07/24/2016   Sleep apnea    Testicular hypofunction    Tubular adenoma 10/28/2017   Tussive syncopes 10/15/2013   Type 2 diabetes mellitus ([place])    Vitamin D deficiency disease     Past Surgical History:  Procedure Laterality Date   ADRENALECTOMY     CATARACT EXTRACTION  10/2014   CATARACT EXTRACTION EXTRACAPSULAR  11/05/2016   with Insertion Intraocular Prostheis.    COLONOSCOPY  05/06/2017   with removal lesions by snare.   COLONOSCOPY W/ BIOPSIES     05/06/2017   NEPHRECTOMY     POPLITEAL SYNOVIAL CYST EXCISION     SPINE SURGERY  1989   THYROIDECTOMY  THYROIDECTOMY, PARTIAL     TONSILLECTOMY      There were no vitals filed for this visit.   Subjective Assessment - 02/23/22 0848     Subjective Pt reports his wife is in [place] and he is on his own this weekend.    Pertinent History Patient is a 76 year old male with referral from Neurology- Dr. Joselyn Arrow for unspecified abnormality of gait. He presents today with his wife and reports > 6 month history of progressive issues with poor balance and multiple falls. Patient has renal Cancer and currently has Pallative care- Was stabilized out of Hospice. Patient presents with the following past medical history: A-Fib and flutter, Lung Cancer, Obstructive sleep apnea, Upper respiratory infection, Addison's disease, Hypothyroidism, Renal Cancer, DM, gout,  arthritis, Chronic kidney disease and Restless leg syndrome. Patient lives with wife in independent living at [facility]. Prior level of function was independent with transfers and mobility using walker yet some assist with dressing.    Currently in Pain? No/denies              INTERVENTIONS:   Therapeutic Exerises:  Pre-workout O2= 97%; HR=75 bpm Nustep- Interval L0- 2 min L2- 2 min L0-21mn L2 -2 min total distance= 0.2  O2 sat=96% and HR= 86 bpm   Sit to stand from chair without UE support, green therapad under right foot x 12 reps.    Matrix cable sytem- heel to toe activity with 7.5 lb- lower leg/ankle strap- going forward x 3 sets of 15 reps on left  Matrix cable system - heel to toe activity with 7.5 lb- lower leg/ankle strap - going backward x 3 sets of 15 reps on left   Matrix cable system- seated ham curls with 12.5lb x 15 reps   Education provided throughout session via VC/TC and demonstration to facilitate movement at target joints and correct muscle activation for all testing and exercises performed.                  PT Education - 02/23/22 1117     Education Details Exercise technique    Person(s) Educated Patient    Methods Explanation;Demonstration;Tactile cues;Verbal cues    Comprehension Verbalized understanding;Returned demonstration;Verbal cues required;Tactile cues required;Need further instruction              PT Short Term Goals - 08/21/21 0814       PT SHORT TERM GOAL #1   Title Pt will be independent with HEP in order to improve strength and balance in order to decrease fall risk and improve function at home and work.    Baseline 07/21/2021- Paitent presents with no formal HEP in place. 08/21/2021- Patient reports compliance with HEP and states no questions as of today- able to perform standin using his rollator for support.    Time 6    Period Weeks    Status Achieved    Target Date 09/01/21               PT Long  Term Goals - 01/29/22 0910       PT LONG TERM GOAL #1   Title Pt will improve FOTO to target score of 53  to display perceived improvements in ability to complete ADL's.    Baseline 07/21/2021= 45; 1/5=51. 10/12/2021- Just reassessed for progress note so did not reassess again- will continue with goal into new certification 26/04 454%   Time 12    Period Weeks    Status Achieved  Target Date 03/30/22      PT LONG TERM GOAL #2   Title Pt will improve BERG by at least 5 points in order to demonstrate clinically significant improvement in balance.    Baseline 07/21/2021= 39/56; 08/21/2021=46/56- Will keep goal active to ensure patient able to test consistently; 10/05/2021= 52/56    Time 12    Period Weeks    Status Achieved    Target Date 10/13/21      PT LONG TERM GOAL #3   Title Pt will decrease 5TSTS by at least 5 seconds in order to demonstrate clinically significant improvement in LE strength.    Baseline 07/21/2021= 21 sec without UE Support; 11/21= 14.0 sec without UE support-.Will Keep goal active to ensure consistency.  10/05/2021=14.75 sec without UE support  (improved from 21 sec and unchanged from last assessment of 14 sec)    Time 12    Period Weeks    Status Achieved    Target Date 10/13/21      PT LONG TERM GOAL #4   Title Pt will decrease TUG to below  18 seconds/decrease in order to demonstrate decreased fall risk.    Baseline 07/21/2021= 23 sec with 4WW; 08/21/2021= 22.13 sec with 4WW; 10/05/2021= 19.51 sec avg with 4WW 2/10: 15.4 seconds with cane  12/14/2021= 18.0 sec    Time 12    Period Weeks    Status Achieved    Target Date 01/05/22      PT LONG TERM GOAL #5   Title Pt will increase 10MWT by at least 0.23 m/s in order to demonstrate clinically significant improvement in community ambulation.    Baseline 07/21/2021= 0.6ms; 08/21/2021= 0.57 m/s using 4WW; 10/05/2021= 0.6 m/s avg using 4WW 2/10: 0.51 m/s with cane. 12/14/2021= 0.65 m/s using SPC    Time 12    Period  Weeks    Status Partially Met    Target Date 03/30/22      PT LONG TERM GOAL #6   Title Patient will increase six minute walk test distance to > 100 for progression to community ambulator and improve gait ability    Baseline 01/05/2022= 495 feet using SPC; 01/29/2022= 470 feet in 4 min 30 sec using SPC    Time 12    Period Weeks    Status On-going    Target Date 03/30/22                   Plan - 02/23/22 0853     Clinical Impression Statement Patient presented with good motivation for today's session. Treatment continued to focus on Left LE strengthening and attention to left with mobility. He performed well with specific tasks and reported only fatigue as limiting factor. He was able to focus on complete all activities on left LE today with VC. Patient will benefit from continued PT services to focus on LE strength and safety with mobility to improve overall mobility, decrease fall risk, and improve quality of life.    Personal Factors and Comorbidities Comorbidity 3+    Comorbidities A- Fib, Renal cancer, Addison's disease, gout, arthritis, DM    Examination-Activity Limitations Caring for Others;Carry;Dressing;Lift;Reach Overhead;Squat;Stairs;Transfers;Stand    Examination-Participation Restrictions CInvestment banker, operationalYard Work;Shop    Stability/Clinical Decision Making Evolving/Moderate complexity    Rehab Potential Good    PT Frequency 1x / week    PT Duration 12 weeks    PT Treatment/Interventions ADLs/Self Care Home Management;Canalith Repostioning;Cryotherapy;Electrical Stimulation;Moist Heat;Traction;Ultrasound;DME Instruction;Gait training;Stair training;Functional mobility training;Therapeutic activities;Therapeutic exercise;Balance training;Neuromuscular re-education;Patient/family education;Orthotic  Fit/Training;Wheelchair mobility training;Manual techniques;Passive range of motion;Dry needling;Energy conservation;Taping;Vestibular    PT Next Visit Plan  Continue with progress LE strengthening; gait training for safe mobility, Neuromuscular re-ed for improved overall balance.  Continue plan of care as previously indicated    PT Home Exercise Plan 08/17/2021-Access Code: [email or web address]; no updates    Consulted and Agree with Plan of Care Patient;Family member/caregiver    Family Member Consulted WIfe- [name]             Patient will benefit from skilled therapeutic intervention in order to improve the following deficits and impairments:  Abnormal gait, Decreased activity tolerance, Decreased balance, Decreased coordination, Decreased endurance, Decreased knowledge of use of DME, Decreased mobility, Decreased strength, Difficulty walking, Hypomobility, Impaired sensation, Impaired vision/preception, Postural dysfunction, Pain  Visit Diagnosis: Difficulty in walking, not elsewhere classified  Muscle weakness (generalized)  Abnormality of gait and mobility  Other abnormalities of gait and mobility  Other lack of coordination  Unsteadiness on feet  Repeated falls     Problem List Patient Active Problem List   Diagnosis Date Noted   Addisons disease ([place]) 03/16/2021   Low HDL (under 40) 03/16/2021   Difficulty sleeping 11/21/2020   Numbness and tingling 11/21/2020   Difficulty walking 01/14/2020   History of recent fall 01/14/2020   Sleep disorder 01/14/2020   Pancreatic mass 08/01/2018   H/O sebaceous cyst 01/06/2018   Tubular adenoma 10/28/2017   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   Edema leg 03/07/2017   Hx of atrial flutter 03/07/2017   OSA (obstructive sleep apnea) 07/24/2016   RLS (restless legs syndrome) 07/24/2016   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   Atrial fibrillation and flutter ([place]) 10/20/2014   Hyperlipidemia 06/16/2014   Adrenal crisis ([place]) 04/19/2014   Cough 03/29/2014   Weakness 03/29/2014   Cough syncope 10/15/2013   URI (upper respiratory infection) 10/15/2013   Dyslipidemia  09/04/2013   Gout 09/04/2013   Chronic kidney disease, stage III (moderate) ([place]) 11/13/2011   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   Hypothyroidism (acquired) 11/30/2010   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[place]) 11/10/2010    Lewis Moccasin, PT 02/23/2022, 11:18 AM  [place] MAIN [facility] SERVICES [address] [place], [place], [address] Phone: [phone]   Fax:  [phone]  Name: Tony Huffman MRN: [id number] Date of Birth: 12/12/1945

## 2022-02-23 NOTE — Telephone Encounter (Signed)
Message left to check on patient; awaiting return call.

## 2022-02-27 ENCOUNTER — Encounter: Payer: Self-pay | Admitting: Nurse Practitioner

## 2022-02-27 ENCOUNTER — Ambulatory Visit: Payer: Medicare Other | Admitting: Nurse Practitioner

## 2022-02-27 VITALS — BP 120/70 | HR 88 | Temp 99.2°F | Ht 68.0 in

## 2022-02-27 DIAGNOSIS — W19XXXA Unspecified fall, initial encounter: Secondary | ICD-10-CM | POA: Diagnosis not present

## 2022-02-27 DIAGNOSIS — L729 Follicular cyst of the skin and subcutaneous tissue, unspecified: Secondary | ICD-10-CM | POA: Diagnosis not present

## 2022-02-27 DIAGNOSIS — L089 Local infection of the skin and subcutaneous tissue, unspecified: Secondary | ICD-10-CM | POA: Diagnosis not present

## 2022-02-27 MED ORDER — DOXYCYCLINE HYCLATE 100 MG PO TABS
100.0000 mg | ORAL_TABLET | Freq: Two times a day (BID) | ORAL | 0 refills | Status: DC
Start: 2022-02-27 — End: 2022-04-05

## 2022-02-27 NOTE — Patient Instructions (Signed)
To start doxycycline 100 mg by mouth twice daily take with food Can use probiotic to help with gut health  Warm compresses with epsom salt- 20 mins 3 times daily

## 2022-02-27 NOTE — Progress Notes (Signed)
Careteam: Patient Care Team: Tony Chandler, NP as PCP - General (Geriatric Medicine) Tony Carina Betsey Holiday, MD as Physician Assistant (Endocrinology) Tony Bene, MD as Referring Physician (Neurology)  Advanced Directive information    Allergies  Allergen Reactions   Other Rash    Blisters with bandaids Blisters with bandaids    Tegaderm Ag Mesh [Silver]    Tape Rash    Blisters, rash. Paper tape OK. Blisters, rash. Paper tape OK.     Chief Complaint  Patient presents with   Acute Visit    Patient complains of chest pain. Patient states that he has a cyst. Patient noticed it 3 days ago. No drainage. Patient having pain in area along with redness. Patient used acne medication with no relief.Patient fell on yesterday, hitting eye on concrete.     HPI: Patient is a 76 y.o. male seen in today at the [facility] for painful cyst on chest.  Noticed 3 days ago and since then it has become more painful and red.  No drainage.   Reports he is more wobbly since cyst has appeared and feel yesterday. No headache today (had yesterday), no blurred vision or increase in confusion or fatigue.  He called EMT yesterday and took vitals which were normal.  Low grade temp today but this is new.    Review of Systems:  Review of Systems  Constitutional:  Negative for chills, fever and weight loss.  HENT:  Negative for tinnitus.   Cardiovascular:  Negative for chest pain and palpitations.  Gastrointestinal:  Negative for abdominal pain, constipation, diarrhea and heartburn.  Genitourinary:  Negative for dysuria, frequency and urgency.  Musculoskeletal:  Positive for falls.  Skin:        Red tender area on chest   Neurological:  Positive for sensory change and weakness. Negative for dizziness, focal weakness and headaches.   Past Medical History:  Diagnosis Date   Abnormal heart rhythm 10/20/2014   Addison's disease ([place])    Adrenal insufficiency (HCC)    Allergy     Ambulates with cane    Anxiety    Arrhythmia    Cataract cortical, senile    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   Chronic kidney disease, stage 3 ([place]) 11/13/2011   Cough 03/29/2014   Edema leg 03/07/2017   Elevated cholesterol with high triglycerides 09/04/2013   Elevated red blood cell count    Erectile dysfunction    Falls frequently    Fever 03/29/2014   Generalized weakness 03/29/2014   GERD (gastroesophageal reflux disease)    Gout 09/04/2013   H/O adrenal insufficiency 05/17/2014   H/O atrial flutter 03/07/2017   H/O atrial flutter    H/O eye surgery    H/O sebaceous cyst 01/06/2018   H/O thyroid disease    H/O upper respiratory infection 10/15/2013   Hearing loss    History of back surgery    History of blepharoplasty    History of cancer 10/04/2015   History of chemotherapy    History of esophagogastroduodenoscopy (EGD)    History of prediabetes    Hyperlipidemia 06/16/2014   Hypertension    Hypertriglyceridemia    Hypothyroidism (acquired) 04/14/2014   Left leg swelling 11/23/2016   Low HDL (under 40)    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[place]) 11/10/2010   Malignant neoplasm of unspecified kidney, except renal pelvis (HCC)    OSA (obstructive sleep apnea) 07/24/2016   OSA (obstructive sleep apnea)    PAF (paroxysmal atrial  fibrillation) ([place])    PAF (paroxysmal atrial fibrillation) ([place])    Pancreatic mass 08/01/2018   Renal carcinoma ([place]) 06/16/2015   RLS (restless legs syndrome) 07/24/2016   Sleep apnea    Testicular hypofunction    Tubular adenoma 10/28/2017   Tussive syncopes 10/15/2013   Type 2 diabetes mellitus ([place])    Vitamin D deficiency disease    Past Surgical History:  Procedure Laterality Date   ADRENALECTOMY     CATARACT EXTRACTION  10/2014   CATARACT EXTRACTION EXTRACAPSULAR  11/05/2016   with Insertion Intraocular Prostheis.    COLONOSCOPY  05/06/2017   with removal lesions by snare.   COLONOSCOPY W/ BIOPSIES     05/06/2017    NEPHRECTOMY     POPLITEAL SYNOVIAL CYST EXCISION     SPINE SURGERY  1989   THYROIDECTOMY     THYROIDECTOMY, PARTIAL     TONSILLECTOMY     Social History:   reports that he quit smoking about 45 years ago. His smoking use included cigarettes. He has a 10.00 pack-year smoking history. He has never used smokeless tobacco. He reports that he does not currently use alcohol. He reports that he does not use drugs.  Family History  Problem Relation Age of Onset   Ovarian cancer Mother    Cancer Mother    Cancer Father    Heart disease Father    Coronary artery disease Father    Hypertension Father    Macular degeneration Father    Heart disease Brother    Diabetes Brother    Coronary artery disease Brother    Diabetes type II Brother    Coronary artery disease Paternal Grandfather     Medications: Patient's Medications  New Prescriptions   No medications on file  Previous Medications   ACCU-CHEK SOFTCLIX LANCETS LANCETS    Use to check blood sugar once daily. Dx: E11.49   BLOOD GLUCOSE METER KIT AND SUPPLIES    1 each by Other route as directed. Dispense based on patient and insurance preference. Use up to four times daily as directed. (FOR ICD-10 E10.9, E11.9).   CHOLECALCIFEROL 125 MCG (5000 UT) CAPSULE    Take 5,000 Units by mouth daily.   COLCHICINE 0.6 MG TABLET    Take 1 tablet (0.6 mg total) by mouth as needed. First sign of Gout Flare. Take 2 tablets ( 1.20m) by mouth at first sign of gout flare followed by 1 tablet ( 0.669m after 1 hour. ( max 1.83m65mithin 1 hours)   CYANOCOBALAMIN 1000 MCG TABLET    Take 1,000 mcg by mouth every other day.   FLUDROCORTISONE ACETATE (FLORINEF PO)    Take 0.1 mg by mouth every other day. On alternate days take 1/2 tablet.   FUROSEMIDE (LASIX) 20 MG TABLET    Take 20 mg by mouth as needed. For Edema.   GABAPENTIN (NEURONTIN) 600 MG TABLET    Take 900 mg by mouth daily. Takes 1 and 1/2 tablet by mouth nightly.   GLUCOSE BLOOD (ACCU-CHEK AVIVA  PLUS) TEST STRIP    Use to test blood sugar once daily. Dx: E11.49   HYDROCORTISONE (CORTEF) 10 MG TABLET    Take by mouth daily. Takes 1 and 1/2 tablet every morning, 1/2 tablet every afternoon, and 1/2 tablet every evening. Take double dose as directed for stress, may take up to 85 tablets monthly.   LEVOTHYROXINE (SYNTHROID) 75 MCG TABLET    Take 75 mcg by mouth daily.   LIDOCAINE (XYLOCAINE) 5 %  OINTMENT    Apply 1 application topically as needed.   METFORMIN (GLUMETZA) 500 MG (MOD) 24 HR TABLET    Take 500 mg by mouth daily.   MULTIPLE VITAMINS-MINERALS (PRESERVISION AREDS 2 PO)    Take 1 capsule by mouth in the morning and at bedtime.   NORTRIPTYLINE (PAMELOR) 10 MG CAPSULE    2 nightly   PREDNISONE (STERAPRED UNI-PAK 21 TAB) 10 MG (21) TBPK TABLET    Use as directed   PSYLLIUM HUSK PO    Take 3.4 g by mouth every evening.  Modified Medications   No medications on file  Discontinued Medications   No medications on file    Physical Exam:  Vitals:   02/27/22 0944  BP: 120/70  Pulse: 88  Temp: 99.2 F (37.3 C)  Height: _0  (1.727 m)   Body mass index is 31.32 kg/m. Wt Readings from Last 3 Encounters:  10/10/21 206 lb (93.4 kg)  07/21/21 201 lb (91.2 kg)  05/16/21 205 lb (93 kg)    Physical Exam Constitutional:      General: He is not in acute distress.    Appearance: He is well-developed. He is not diaphoretic.  HENT:     Head: Normocephalic and atraumatic.     Right Ear: External ear normal.     Left Ear: External ear normal.     Mouth/Throat:     Pharynx: No oropharyngeal exudate.  Eyes:     Conjunctiva/sclera: Conjunctivae normal.     Pupils: Pupils are equal, round, and reactive to light.  Musculoskeletal:        General: No tenderness.     Right lower leg: No edema.     Left lower leg: No edema.  Skin:    General: Skin is warm and dry.     Findings: Erythema (red tender area on chest, no drainage.) present.  Neurological:     Mental Status: He is alert  and oriented to person, place, and time.    Labs reviewed: Basic Metabolic Panel: Recent Labs    04/07/21 0000 08/14/21 0000  NA  --  139  K  --  4.8  CL  --  103  CO2  --  29*  BUN  --  18  CREATININE  --  1.1  CALCIUM  --  9.1  TSH 2.51 3.34   Liver Function Tests: No results for input(s): AST, ALT, ALKPHOS, BILITOT, PROT, ALBUMIN in the last 8760 hours. No results for input(s): LIPASE, AMYLASE in the last 8760 hours. No results for input(s): AMMONIA in the last 8760 hours. CBC: No results for input(s): WBC, NEUTROABS, HGB, HCT, MCV, PLT in the last 8760 hours. Lipid Panel: No results for input(s): CHOL, HDL, LDLCALC, TRIG, CHOLHDL, LDLDIRECT in the last 8760 hours. TSH: Recent Labs    04/07/21 0000 08/14/21 0000  TSH 2.51 3.34   A1C: Lab Results  Component Value Date   HGBA1C 7.2 08/14/2021     Assessment/Plan 1. Infected cyst of skin - to use epsom salt warm soaks to chest 3 times daily ~20 mins - doxycycline (VIBRA-TABS) 100 MG tablet; Take 1 tablet (100 mg total) by mouth 2 (two) times daily.  Dispense: 20 tablet; Refill: 0 - strict follow up precautions given  2. Fall, initial encounter -without acute injury noted. No pain.  -educated to use walker at this time for stability. (Using cane and very unsteady) and fall prevention.    Carlos American. Harle Battiest  [facility] [phone]

## 2022-03-01 ENCOUNTER — Ambulatory Visit: Payer: Medicare Other | Attending: Neurology

## 2022-03-01 DIAGNOSIS — R2689 Other abnormalities of gait and mobility: Secondary | ICD-10-CM | POA: Insufficient documentation

## 2022-03-01 DIAGNOSIS — R2681 Unsteadiness on feet: Secondary | ICD-10-CM | POA: Diagnosis present

## 2022-03-01 DIAGNOSIS — M6281 Muscle weakness (generalized): Secondary | ICD-10-CM | POA: Diagnosis present

## 2022-03-01 DIAGNOSIS — R269 Unspecified abnormalities of gait and mobility: Secondary | ICD-10-CM | POA: Insufficient documentation

## 2022-03-01 DIAGNOSIS — R296 Repeated falls: Secondary | ICD-10-CM | POA: Diagnosis present

## 2022-03-01 DIAGNOSIS — R278 Other lack of coordination: Secondary | ICD-10-CM | POA: Insufficient documentation

## 2022-03-01 DIAGNOSIS — R262 Difficulty in walking, not elsewhere classified: Secondary | ICD-10-CM | POA: Diagnosis present

## 2022-03-01 NOTE — Therapy (Signed)
[place] MAIN [facility] SERVICES [address] [place], [place], [address] Phone: [phone]   Fax:  [phone]  Physical Therapy Treatment  Patient Details  Name: Tony Huffman MRN: [id number] Date of Birth: 09/19/1946 Referring Provider (PT): Dr. Joselyn Arrow   Encounter Date: 03/01/2022   PT End of Session - 03/01/22 1119     Visit Number 92    Number of Visits 56    Date for PT Re-Evaluation 03/30/22    Authorization Type Medicare Part B; next session 1/10 PN 11/10/21    Authorization Time Period 01/05/2022-03/30/2022    Progress Note Due on Visit 60    PT Start Time 1017    PT Stop Time 1059    PT Time Calculation (min) 42 min    Equipment Utilized During Treatment Gait belt    Activity Tolerance Patient tolerated treatment well;No increased pain;Patient limited by fatigue    Behavior During Therapy [facility] for tasks assessed/performed             Past Medical History:  Diagnosis Date   Abnormal heart rhythm 10/20/2014   Addison's disease ([place])    Adrenal insufficiency ([place])    Allergy    Ambulates with cane    Anxiety    Arrhythmia    Cataract cortical, senile    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   Chronic kidney disease, stage 3 ([place]) 11/13/2011   Cough 03/29/2014   Edema leg 03/07/2017   Elevated cholesterol with high triglycerides 09/04/2013   Elevated red blood cell count    Erectile dysfunction    Falls frequently    Fever 03/29/2014   Generalized weakness 03/29/2014   GERD (gastroesophageal reflux disease)    Gout 09/04/2013   H/O adrenal insufficiency 05/17/2014   H/O atrial flutter 03/07/2017   H/O atrial flutter    H/O eye surgery    H/O sebaceous cyst 01/06/2018   H/O thyroid disease    H/O upper respiratory infection 10/15/2013   Hearing loss    History of back surgery    History of blepharoplasty    History of cancer 10/04/2015   History of chemotherapy    History of esophagogastroduodenoscopy (EGD)     History of prediabetes    Hyperlipidemia 06/16/2014   Hypertension    Hypertriglyceridemia    Hypothyroidism (acquired) 04/14/2014   Left leg swelling 11/23/2016   Low HDL (under 40)    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[place]) 11/10/2010   Malignant neoplasm of unspecified kidney, except renal pelvis (HCC)    OSA (obstructive sleep apnea) 07/24/2016   OSA (obstructive sleep apnea)    PAF (paroxysmal atrial fibrillation) (HCC)    PAF (paroxysmal atrial fibrillation) ([place])    Pancreatic mass 08/01/2018   Renal carcinoma ([place]) 06/16/2015   RLS (restless legs syndrome) 07/24/2016   Sleep apnea    Testicular hypofunction    Tubular adenoma 10/28/2017   Tussive syncopes 10/15/2013   Type 2 diabetes mellitus ([place])    Vitamin D deficiency disease     Past Surgical History:  Procedure Laterality Date   ADRENALECTOMY     CATARACT EXTRACTION  10/2014   CATARACT EXTRACTION EXTRACAPSULAR  11/05/2016   with Insertion Intraocular Prostheis.    COLONOSCOPY  05/06/2017   with removal lesions by snare.   COLONOSCOPY W/ BIOPSIES     05/06/2017   NEPHRECTOMY     POPLITEAL SYNOVIAL CYST EXCISION     SPINE SURGERY  1989   THYROIDECTOMY  THYROIDECTOMY, PARTIAL     TONSILLECTOMY      There were no vitals filed for this visit.   Subjective Assessment - 03/01/22 1116     Subjective Patient reports having a fall earlier this week- states he did bump his head  and left knee but denies any pain. States he was walking oudoors with cane and became off balance and couldn't recover falling forward on his face. States he has been using his rollator    Pertinent History Patient is a 76 year old male with referral from Neurology- Dr. Joselyn Arrow for unspecified abnormality of gait. He presents today with his wife and reports > 6 month history of progressive issues with poor balance and multiple falls. Patient has renal Cancer and currently has Pallative care- Was stabilized out of Hospice. Patient presents  with the following past medical history: A-Fib and flutter, Lung Cancer, Obstructive sleep apnea, Upper respiratory infection, Addison's disease, Hypothyroidism, Renal Cancer, DM, gout, arthritis, Chronic kidney disease and Restless leg syndrome. Patient lives with wife in independent living at [facility]. Prior level of function was independent with transfers and mobility using walker yet some assist with dressing.    Patient Stated Goals I would like to walk better and not fall    Currently in Pain? No/denies           INTERVENTIONS:   Therapeutic activities:   Discussed safety/fall education  with gait due to report of recent fall. Discussed attempting to stop and regroup prior to loosing control or excessive forward trunk lean. Discussed cane vs. Rollator- advised patient that due to recent falls that he should use his rollator at this time for max safety.    Observed gait using the 2 types of equipment that the patient utilizes:   gait= 150 feet with SPC= 2 min (Increased dragging of left LE after about 75 feet- increased risk of falling)  Gait =150 feet with 4WW= 1 min 45 sec (Less overall dragging of foot - but also requires VC to maintain safety and to not lean excessive forward and keep one foot at back wheels of walker.   Therapeutic Exercise:  Discussed aquatic exercises and instructed in potential poolside exercises to be performed in stomach height depth water.   At Support bar today with CGA and use of gait belt- patient performed:  - stand march - stand hip ext - stand hip abd - stand knee flex - stand minisquats - stand calf raises - Stand side step (length of bar x 3)  - Stand side squat (length of bar and back x 5)   Education provided throughout session via VC/TC and demonstration to facilitate movement at target joints and correct muscle activation for all testing and exercises performed.                     PT Education - 03/01/22 1118      Education Details Gait safety including gait quality and use of appropriate device.    Person(s) Educated Patient    Methods Explanation;Demonstration;Tactile cues;Verbal cues    Comprehension Verbalized understanding;Returned demonstration;Verbal cues required;Tactile cues required;Need further instruction              PT Short Term Goals - 08/21/21 0814       PT SHORT TERM GOAL #1   Title Pt will be independent with HEP in order to improve strength and balance in order to decrease fall risk and improve function at home and work.  Baseline 07/21/2021- Paitent presents with no formal HEP in place. 08/21/2021- Patient reports compliance with HEP and states no questions as of today- able to perform standin using his rollator for support.    Time 6    Period Weeks    Status Achieved    Target Date 09/01/21               PT Long Term Goals - 01/29/22 0910       PT LONG TERM GOAL #1   Title Pt will improve FOTO to target score of 53  to display perceived improvements in ability to complete ADL's.    Baseline 07/21/2021= 45; 1/5=51. 10/12/2021- Just reassessed for progress note so did not reassess again- will continue with goal into new certification 0/86: 76%    Time 12    Period Weeks    Status Achieved    Target Date 03/30/22      PT LONG TERM GOAL #2   Title Pt will improve BERG by at least 5 points in order to demonstrate clinically significant improvement in balance.    Baseline 07/21/2021= 39/56; 08/21/2021=46/56- Will keep goal active to ensure patient able to test consistently; 10/05/2021= 52/56    Time 12    Period Weeks    Status Achieved    Target Date 10/13/21      PT LONG TERM GOAL #3   Title Pt will decrease 5TSTS by at least 5 seconds in order to demonstrate clinically significant improvement in LE strength.    Baseline 07/21/2021= 21 sec without UE Support; 11/21= 14.0 sec without UE support-.Will Keep goal active to ensure consistency.  10/05/2021=14.75 sec  without UE support  (improved from 21 sec and unchanged from last assessment of 14 sec)    Time 12    Period Weeks    Status Achieved    Target Date 10/13/21      PT LONG TERM GOAL #4   Title Pt will decrease TUG to below  18 seconds/decrease in order to demonstrate decreased fall risk.    Baseline 07/21/2021= 23 sec with 4WW; 08/21/2021= 22.13 sec with 4WW; 10/05/2021= 19.51 sec avg with 4WW 2/10: 15.4 seconds with cane  12/14/2021= 18.0 sec    Time 12    Period Weeks    Status Achieved    Target Date 01/05/22      PT LONG TERM GOAL #5   Title Pt will increase 10MWT by at least 0.23 m/s in order to demonstrate clinically significant improvement in community ambulation.    Baseline 07/21/2021= 0.40ms; 08/21/2021= 0.57 m/s using 4WW; 10/05/2021= 0.6 m/s avg using 4WW 2/10: 0.51 m/s with cane. 12/14/2021= 0.65 m/s using SPC    Time 12    Period Weeks    Status Partially Met    Target Date 03/30/22      PT LONG TERM GOAL #6   Title Patient will increase six minute walk test distance to > 100 for progression to community ambulator and improve gait ability    Baseline 01/05/2022= 495 feet using SPC; 01/29/2022= 470 feet in 4 min 30 sec using SPC    Time 12    Period Weeks    Status On-going    Target Date 03/30/22                   Plan - 03/01/22 1119     Clinical Impression Statement Patient presents with ongoing disconnect with coordinating left LE with walking tasks and remains at a  high risk of falling. Discussed fall precautions again today with patient after his report of another fall. Advised him to use his 4WW as he appeared safer with this device as tested vs. Cane today. He was also interested to starting some aquatic exercises at his pool so instructed in some basic LE strengthening to be performed in water. He verbalized and demo good understanding of all exercises. Patient will benefit from continued PT services to focus on LE strength and safety with mobility to improve  overall mobility, decrease fall risk, and improve quality of life.    Personal Factors and Comorbidities Comorbidity 3+    Comorbidities A- Fib, Renal cancer, Addison's disease, gout, arthritis, DM    Examination-Activity Limitations Caring for Others;Carry;Dressing;Lift;Reach Overhead;Squat;Stairs;Transfers;Stand    Examination-Participation Restrictions Investment banker, operational;Yard Work;Shop    Stability/Clinical Decision Making Evolving/Moderate complexity    Rehab Potential Good    PT Frequency 1x / week    PT Duration 12 weeks    PT Treatment/Interventions ADLs/Self Care Home Management;Canalith Repostioning;Cryotherapy;Electrical Stimulation;Moist Heat;Traction;Ultrasound;DME Instruction;Gait training;Stair training;Functional mobility training;Therapeutic activities;Therapeutic exercise;Balance training;Neuromuscular re-education;Patient/family education;Orthotic Fit/Training;Wheelchair mobility training;Manual techniques;Passive range of motion;Dry needling;Energy conservation;Taping;Vestibular    PT Next Visit Plan Continue with progress LE strengthening; gait training for safe mobility, Neuromuscular re-ed for improved overall balance.  Continue plan of care as previously indicated    PT Home Exercise Plan 08/17/2021-Access Code: [email or web address]; no updates    Consulted and Agree with Plan of Care Patient;Family member/caregiver    Family Member Consulted WIfe- [name]             Patient will benefit from skilled therapeutic intervention in order to improve the following deficits and impairments:  Abnormal gait, Decreased activity tolerance, Decreased balance, Decreased coordination, Decreased endurance, Decreased knowledge of use of DME, Decreased mobility, Decreased strength, Difficulty walking, Hypomobility, Impaired sensation, Impaired vision/preception, Postural dysfunction, Pain  Visit Diagnosis: Difficulty in walking, not elsewhere classified  Muscle weakness  (generalized)  Abnormality of gait and mobility  Other abnormalities of gait and mobility  Other lack of coordination  Unsteadiness on feet  Repeated falls     Problem List Patient Active Problem List   Diagnosis Date Noted   Addisons disease ([place]) 03/16/2021   Low HDL (under 40) 03/16/2021   Difficulty sleeping 11/21/2020   Numbness and tingling 11/21/2020   Difficulty walking 01/14/2020   History of recent fall 01/14/2020   Sleep disorder 01/14/2020   Pancreatic mass 08/01/2018   H/O sebaceous cyst 01/06/2018   Tubular adenoma 10/28/2017   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   Edema leg 03/07/2017   Hx of atrial flutter 03/07/2017   OSA (obstructive sleep apnea) 07/24/2016   RLS (restless legs syndrome) 07/24/2016   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   Atrial fibrillation and flutter ([place]) 10/20/2014   Hyperlipidemia 06/16/2014   Adrenal crisis ([place]) 04/19/2014   Cough 03/29/2014   Weakness 03/29/2014   Cough syncope 10/15/2013   URI (upper respiratory infection) 10/15/2013   Dyslipidemia 09/04/2013   Gout 09/04/2013   Chronic kidney disease, stage III (moderate) ([place]) 11/13/2011   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   Hypothyroidism (acquired) 11/30/2010   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[place]) 11/10/2010    Lewis Moccasin, PT 03/01/2022, 1:31 PM  [place] MAIN [facility] SERVICES [address] [place], [place], [address] Phone: [phone]   Fax:  [phone]  Name: Tony Huffman MRN: [id number] Date of Birth: 1946-05-14

## 2022-03-02 ENCOUNTER — Ambulatory Visit: Payer: Medicare Other

## 2022-03-05 ENCOUNTER — Ambulatory Visit: Payer: Medicare Other

## 2022-03-09 ENCOUNTER — Ambulatory Visit: Payer: Medicare Other

## 2022-03-09 DIAGNOSIS — R269 Unspecified abnormalities of gait and mobility: Secondary | ICD-10-CM

## 2022-03-09 DIAGNOSIS — R262 Difficulty in walking, not elsewhere classified: Secondary | ICD-10-CM | POA: Diagnosis not present

## 2022-03-09 DIAGNOSIS — R2689 Other abnormalities of gait and mobility: Secondary | ICD-10-CM

## 2022-03-09 DIAGNOSIS — R278 Other lack of coordination: Secondary | ICD-10-CM

## 2022-03-09 DIAGNOSIS — M6281 Muscle weakness (generalized): Secondary | ICD-10-CM

## 2022-03-09 DIAGNOSIS — R296 Repeated falls: Secondary | ICD-10-CM

## 2022-03-09 DIAGNOSIS — R2681 Unsteadiness on feet: Secondary | ICD-10-CM

## 2022-03-09 NOTE — Therapy (Signed)
[place] MAIN [facility] SERVICES [address] [place], [place], [address] Phone: [phone]   Fax:  [phone]  Physical Therapy Treatment  Patient Details  Name: Tony Huffman MRN: [id number] Date of Birth: Jan 30, 1946 Referring Provider (PT): Dr. Joselyn Arrow   Encounter Date: 03/09/2022   PT End of Session - 03/09/22 0839     Visit Number 48    Number of Visits 29    Date for PT Re-Evaluation 03/30/22    Authorization Type Medicare Part B; next session 1/10 PN 11/10/21    Authorization Time Period 01/05/2022-03/30/2022    Progress Note Due on Visit 60    PT Start Time 0840    PT Stop Time 0929    PT Time Calculation (min) 49 min    Equipment Utilized During Treatment Gait belt    Activity Tolerance Patient tolerated treatment well;No increased pain;Patient limited by fatigue    Behavior During Therapy [facility] for tasks assessed/performed             Past Medical History:  Diagnosis Date   Abnormal heart rhythm 10/20/2014   Addison's disease ([place])    Adrenal insufficiency ([place])    Allergy    Ambulates with cane    Anxiety    Arrhythmia    Cataract cortical, senile    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   Chronic kidney disease, stage 3 ([place]) 11/13/2011   Cough 03/29/2014   Edema leg 03/07/2017   Elevated cholesterol with high triglycerides 09/04/2013   Elevated red blood cell count    Erectile dysfunction    Falls frequently    Fever 03/29/2014   Generalized weakness 03/29/2014   GERD (gastroesophageal reflux disease)    Gout 09/04/2013   H/O adrenal insufficiency 05/17/2014   H/O atrial flutter 03/07/2017   H/O atrial flutter    H/O eye surgery    H/O sebaceous cyst 01/06/2018   H/O thyroid disease    H/O upper respiratory infection 10/15/2013   Hearing loss    History of back surgery    History of blepharoplasty    History of cancer 10/04/2015   History of chemotherapy    History of esophagogastroduodenoscopy (EGD)     History of prediabetes    Hyperlipidemia 06/16/2014   Hypertension    Hypertriglyceridemia    Hypothyroidism (acquired) 04/14/2014   Left leg swelling 11/23/2016   Low HDL (under 40)    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[place]) 11/10/2010   Malignant neoplasm of unspecified kidney, except renal pelvis (HCC)    OSA (obstructive sleep apnea) 07/24/2016   OSA (obstructive sleep apnea)    PAF (paroxysmal atrial fibrillation) (HCC)    PAF (paroxysmal atrial fibrillation) ([place])    Pancreatic mass 08/01/2018   Renal carcinoma ([place]) 06/16/2015   RLS (restless legs syndrome) 07/24/2016   Sleep apnea    Testicular hypofunction    Tubular adenoma 10/28/2017   Tussive syncopes 10/15/2013   Type 2 diabetes mellitus ([place])    Vitamin D deficiency disease     Past Surgical History:  Procedure Laterality Date   ADRENALECTOMY     CATARACT EXTRACTION  10/2014   CATARACT EXTRACTION EXTRACAPSULAR  11/05/2016   with Insertion Intraocular Prostheis.    COLONOSCOPY  05/06/2017   with removal lesions by snare.   COLONOSCOPY W/ BIOPSIES     05/06/2017   NEPHRECTOMY     POPLITEAL SYNOVIAL CYST EXCISION     SPINE SURGERY  1989   THYROIDECTOMY  THYROIDECTOMY, PARTIAL     TONSILLECTOMY      There were no vitals filed for this visit.   Subjective Assessment - 03/09/22 0838     Subjective Patient reports having a better week so far. Denies any further falls and no pain today.    Pertinent History Patient is a 76 year old male with referral from Neurology- Dr. Joselyn Arrow for unspecified abnormality of gait. He presents today with his wife and reports > 6 month history of progressive issues with poor balance and multiple falls. Patient has renal Cancer and currently has Pallative care- Was stabilized out of Hospice. Patient presents with the following past medical history: A-Fib and flutter, Lung Cancer, Obstructive sleep apnea, Upper respiratory infection, Addison's disease, Hypothyroidism, Renal Cancer,  DM, gout, arthritis, Chronic kidney disease and Restless leg syndrome. Patient lives with wife in independent living at [facility]. Prior level of function was independent with transfers and mobility using walker yet some assist with dressing.    Patient Stated Goals I would like to walk better and not fall    Currently in Pain? No/denies               INTERVENTIONS:   Therex:   Nustep- Progressive intensity - VC to keep SPM at 50 SPM or greater L3 - 1 min 30 sec L5- 1 min L6- 45 sec L7- 30  sec L8- 15 sec  L3- 11min L5- 1 min L6- 49min L7- 30 sec  L8- 30 sec Total distance = 0.18 mi O2 sat= 96%; HR=75 bpm   170 feet gait with SPC and using 5lb AW on LE - VC for step length- Patient able to walk with good reciprocal steps for first 75 feet but required 2 stops due to poor control of gait with forward lean and dragging left LE.He was able to regroup and recover on his own with only prompts to stop.   2nd walk - SPC with 5 lb x 75 feet in 1 min 12 sec  3rd walk - SPC without weight x 75  feet in 53.40 sec  Soccer kick activity: CGA and gait belt- Patient performed reactive soccer kick with SPT( who kicked ball from approx 15 feet away- to either patient's right or left side). Patient performed several min of kicking then progressed to adding a step forward with opp leg of kick leg with about 50% accuracy.    Standing Hip ext using Matrix cable system- 7.5 lb 2 sets of 12 reps- VC for technique (to avoid excessive leaning forward)   Standing Hip swings over 18" height block  (left to right over block then back right to left) x 5 each LE then switch for total of 10 reps each LE  Education provided throughout session via VC/TC and demonstration to facilitate movement at target joints and correct muscle activation for all testing and exercises performed. Rationale for Evaluation and Treatment Rehabilitation                       PT Education - 03/09/22 0839      Education Details Exercise technique    Person(s) Educated Patient    Methods Explanation;Demonstration;Tactile cues;Verbal cues    Comprehension Verbalized understanding;Returned demonstration;Verbal cues required;Tactile cues required;Need further instruction              PT Short Term Goals - 08/21/21 0814       PT SHORT TERM GOAL #1   Title Pt will  be independent with HEP in order to improve strength and balance in order to decrease fall risk and improve function at home and work.    Baseline 07/21/2021- Paitent presents with no formal HEP in place. 08/21/2021- Patient reports compliance with HEP and states no questions as of today- able to perform standin using his rollator for support.    Time 6    Period Weeks    Status Achieved    Target Date 09/01/21               PT Long Term Goals - 01/29/22 0910       PT LONG TERM GOAL #1   Title Pt will improve FOTO to target score of 53  to display perceived improvements in ability to complete ADL's.    Baseline 07/21/2021= 45; 1/5=51. 10/12/2021- Just reassessed for progress note so did not reassess again- will continue with goal into new certification 9/76: 73%    Time 12    Period Weeks    Status Achieved    Target Date 03/30/22      PT LONG TERM GOAL #2   Title Pt will improve BERG by at least 5 points in order to demonstrate clinically significant improvement in balance.    Baseline 07/21/2021= 39/56; 08/21/2021=46/56- Will keep goal active to ensure patient able to test consistently; 10/05/2021= 52/56    Time 12    Period Weeks    Status Achieved    Target Date 10/13/21      PT LONG TERM GOAL #3   Title Pt will decrease 5TSTS by at least 5 seconds in order to demonstrate clinically significant improvement in LE strength.    Baseline 07/21/2021= 21 sec without UE Support; 11/21= 14.0 sec without UE support-.Will Keep goal active to ensure consistency.  10/05/2021=14.75 sec without UE support  (improved from 21 sec  and unchanged from last assessment of 14 sec)    Time 12    Period Weeks    Status Achieved    Target Date 10/13/21      PT LONG TERM GOAL #4   Title Pt will decrease TUG to below  18 seconds/decrease in order to demonstrate decreased fall risk.    Baseline 07/21/2021= 23 sec with 4WW; 08/21/2021= 22.13 sec with 4WW; 10/05/2021= 19.51 sec avg with 4WW 2/10: 15.4 seconds with cane  12/14/2021= 18.0 sec    Time 12    Period Weeks    Status Achieved    Target Date 01/05/22      PT LONG TERM GOAL #5   Title Pt will increase 10MWT by at least 0.23 m/s in order to demonstrate clinically significant improvement in community ambulation.    Baseline 07/21/2021= 0.45m/s; 08/21/2021= 0.57 m/s using 4WW; 10/05/2021= 0.6 m/s avg using 4WW 2/10: 0.51 m/s with cane. 12/14/2021= 0.65 m/s using SPC    Time 12    Period Weeks    Status Partially Met    Target Date 03/30/22      PT LONG TERM GOAL #6   Title Patient will increase six minute walk test distance to > 100 for progression to community ambulator and improve gait ability    Baseline 01/05/2022= 495 feet using SPC; 01/29/2022= 470 feet in 4 min 30 sec using SPC    Time 12    Period Weeks    Status On-going    Target Date 03/30/22  Plan - 03/09/22 0839     Clinical Impression Statement Patient performed well overall today - significant faster without weight but seemed to keep attention on left side better overall with decreased VC. He was challenged with LE strengthening exhibiting increased difficulty with Left LE vs. Right specifically with with hip ext and swings. He also had some diffculty motor planning- step reaction with kicking soccer ball yet no LOB. Patient will benefit from continued PT services to focus on LE strength and safety with mobility to improve overall mobility, decrease fall risk, and improve quality of life.    Personal Factors and Comorbidities Comorbidity 3+    Comorbidities A- Fib, Renal cancer,  Addison's disease, gout, arthritis, DM    Examination-Activity Limitations Caring for Others;Carry;Dressing;Lift;Reach Overhead;Squat;Stairs;Transfers;Stand    Examination-Participation Restrictions Investment banker, operational;Yard Work;Shop    Stability/Clinical Decision Making Evolving/Moderate complexity    Rehab Potential Good    PT Frequency 1x / week    PT Duration 12 weeks    PT Treatment/Interventions ADLs/Self Care Home Management;Canalith Repostioning;Cryotherapy;Electrical Stimulation;Moist Heat;Traction;Ultrasound;DME Instruction;Gait training;Stair training;Functional mobility training;Therapeutic activities;Therapeutic exercise;Balance training;Neuromuscular re-education;Patient/family education;Orthotic Fit/Training;Wheelchair mobility training;Manual techniques;Passive range of motion;Dry needling;Energy conservation;Taping;Vestibular    PT Next Visit Plan Continue with progress LE strengthening; gait training for safe mobility, Neuromuscular re-ed for improved overall balance.  Continue plan of care as previously indicated    PT Home Exercise Plan 08/17/2021-Access Code: [email or web address]; no updates    Consulted and Agree with Plan of Care Patient;Family member/caregiver    Family Member Consulted WIfe- [name]             Patient will benefit from skilled therapeutic intervention in order to improve the following deficits and impairments:  Abnormal gait, Decreased activity tolerance, Decreased balance, Decreased coordination, Decreased endurance, Decreased knowledge of use of DME, Decreased mobility, Decreased strength, Difficulty walking, Hypomobility, Impaired sensation, Impaired vision/preception, Postural dysfunction, Pain  Visit Diagnosis: Difficulty in walking, not elsewhere classified  Muscle weakness (generalized)  Abnormality of gait and mobility  Other abnormalities of gait and mobility  Other lack of coordination  Unsteadiness on feet  Repeated  falls     Problem List Patient Active Problem List   Diagnosis Date Noted   Addisons disease ([place]) 03/16/2021   Low HDL (under 40) 03/16/2021   Difficulty sleeping 11/21/2020   Numbness and tingling 11/21/2020   Difficulty walking 01/14/2020   History of recent fall 01/14/2020   Sleep disorder 01/14/2020   Pancreatic mass 08/01/2018   H/O sebaceous cyst 01/06/2018   Tubular adenoma 10/28/2017   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   Edema leg 03/07/2017   Hx of atrial flutter 03/07/2017   OSA (obstructive sleep apnea) 07/24/2016   RLS (restless legs syndrome) 07/24/2016   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 SERVICES [address] [place], [place], [address] Phone: [phone]   Fax:  [phone]  Name: Tony Huffman MRN: [id number] Date of Birth: 1945/12/06

## 2022-03-16 ENCOUNTER — Ambulatory Visit: Payer: Medicare Other

## 2022-03-16 DIAGNOSIS — R2689 Other abnormalities of gait and mobility: Secondary | ICD-10-CM

## 2022-03-16 DIAGNOSIS — R262 Difficulty in walking, not elsewhere classified: Secondary | ICD-10-CM

## 2022-03-16 DIAGNOSIS — R278 Other lack of coordination: Secondary | ICD-10-CM

## 2022-03-16 DIAGNOSIS — R296 Repeated falls: Secondary | ICD-10-CM

## 2022-03-16 DIAGNOSIS — R2681 Unsteadiness on feet: Secondary | ICD-10-CM

## 2022-03-16 DIAGNOSIS — M6281 Muscle weakness (generalized): Secondary | ICD-10-CM

## 2022-03-16 DIAGNOSIS — R269 Unspecified abnormalities of gait and mobility: Secondary | ICD-10-CM

## 2022-03-16 NOTE — Therapy (Signed)
[place] MAIN [facility] SERVICES [address] [place], [place], [address] Phone: [phone]   Fax:  [phone]  Physical Therapy Treatment  Patient Details  Name: Tony Huffman MRN: [id number] Date of Birth: 11-19-1945 Referring Provider (PT): Dr. Joselyn Arrow   Encounter Date: 03/16/2022   PT End of Session - 03/16/22 0855     Visit Number 76    Number of Visits 43    Date for PT Re-Evaluation 03/30/22    Authorization Type Medicare Part B; next session 1/10 PN 11/10/21    Authorization Time Period 01/05/2022-03/30/2022    Progress Note Due on Visit 60    PT Start Time 0847    PT Stop Time 0927    PT Time Calculation (min) 40 min    Equipment Utilized During Treatment Gait belt    Activity Tolerance Patient tolerated treatment well;No increased pain;Patient limited by fatigue    Behavior During Therapy [facility] for tasks assessed/performed             Past Medical History:  Diagnosis Date   Abnormal heart rhythm 10/20/2014   Addison's disease ([place])    Adrenal insufficiency ([place])    Allergy    Ambulates with cane    Anxiety    Arrhythmia    Cataract cortical, senile    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   Chronic kidney disease, stage 3 ([place]) 11/13/2011   Cough 03/29/2014   Edema leg 03/07/2017   Elevated cholesterol with high triglycerides 09/04/2013   Elevated red blood cell count    Erectile dysfunction    Falls frequently    Fever 03/29/2014   Generalized weakness 03/29/2014   GERD (gastroesophageal reflux disease)    Gout 09/04/2013   H/O adrenal insufficiency 05/17/2014   H/O atrial flutter 03/07/2017   H/O atrial flutter    H/O eye surgery    H/O sebaceous cyst 01/06/2018   H/O thyroid disease    H/O upper respiratory infection 10/15/2013   Hearing loss    History of back surgery    History of blepharoplasty    History of cancer 10/04/2015   History of chemotherapy    History of esophagogastroduodenoscopy (EGD)     History of prediabetes    Hyperlipidemia 06/16/2014   Hypertension    Hypertriglyceridemia    Hypothyroidism (acquired) 04/14/2014   Left leg swelling 11/23/2016   Low HDL (under 40)    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[place]) 11/10/2010   Malignant neoplasm of unspecified kidney, except renal pelvis (HCC)    OSA (obstructive sleep apnea) 07/24/2016   OSA (obstructive sleep apnea)    PAF (paroxysmal atrial fibrillation) (HCC)    PAF (paroxysmal atrial fibrillation) ([place])    Pancreatic mass 08/01/2018   Renal carcinoma ([place]) 06/16/2015   RLS (restless legs syndrome) 07/24/2016   Sleep apnea    Testicular hypofunction    Tubular adenoma 10/28/2017   Tussive syncopes 10/15/2013   Type 2 diabetes mellitus ([place])    Vitamin D deficiency disease     Past Surgical History:  Procedure Laterality Date   ADRENALECTOMY     CATARACT EXTRACTION  10/2014   CATARACT EXTRACTION EXTRACAPSULAR  11/05/2016   with Insertion Intraocular Prostheis.    COLONOSCOPY  05/06/2017   with removal lesions by snare.   COLONOSCOPY W/ BIOPSIES     05/06/2017   NEPHRECTOMY     POPLITEAL SYNOVIAL CYST EXCISION     SPINE SURGERY  1989   THYROIDECTOMY  THYROIDECTOMY, PARTIAL     TONSILLECTOMY      There were no vitals filed for this visit.   Subjective Assessment - 03/16/22 0852     Subjective Patient reports having a better week so far. Denies any further falls and no pain today.    Pertinent History Patient is a 76 year old male with referral from Neurology- Dr. Joselyn Arrow for unspecified abnormality of gait. He presents today with his wife and reports > 6 month history of progressive issues with poor balance and multiple falls. Patient has renal Cancer and currently has Pallative care- Was stabilized out of Hospice. Patient presents with the following past medical history: A-Fib and flutter, Lung Cancer, Obstructive sleep apnea, Upper respiratory infection, Addison's disease, Hypothyroidism, Renal Cancer,  DM, gout, arthritis, Chronic kidney disease and Restless leg syndrome. Patient lives with wife in independent living at [facility]. Prior level of function was independent with transfers and mobility using walker yet some assist with dressing.    Patient Stated Goals I would like to walk better and not fall                INTERVENTIONS:   Therapeutic Exercises:   Octane fitness bike x 8 min- BUE/LE- Cues to keep speed > 20  Gait in // bars Forward x 5 down and back - focusing on reciprocal steps  Gait with 5lb AW on left LE in // bars Forward x 10 down and back - focusing on reciprocal steps and VC to take longer step backward with left LE  Side steps with 5lb AW on LLE in // bars down and back x 10  step up onto 4" step with combo- opp LE high knee into GTB for resistance x 25 reps alt LE's   Gait with use of SPC and 1st lap (160 feet with 5lb AW on Left LE= 63mn 43 sec) and 2nd lap with any weight and measured 1 min 23 sec - Occasional VC to stop and regroup due to intermittent dragging of left LE- But overall less than previous visits.  Education provided throughout session via VC/TC and demonstration to facilitate movement at target joints and correct muscle activation for all testing and exercises performed.   Rationale for Evaluation and Treatment Rehabilitation                      PT Education - 03/16/22 0854     Education Details Exercise technique    Person(s) Educated Patient    Methods Explanation;Demonstration;Tactile cues;Verbal cues    Comprehension Verbalized understanding;Returned demonstration;Verbal cues required;Tactile cues required;Need further instruction              PT Short Term Goals - 08/21/21 0814       PT SHORT TERM GOAL #1   Title Pt will be independent with HEP in order to improve strength and balance in order to decrease fall risk and improve function at home and work.    Baseline 07/21/2021- Paitent presents with no  formal HEP in place. 08/21/2021- Patient reports compliance with HEP and states no questions as of today- able to perform standin using his rollator for support.    Time 6    Period Weeks    Status Achieved    Target Date 09/01/21               PT Long Term Goals - 01/29/22 0910       PT LONG TERM GOAL #1  Title Pt will improve FOTO to target score of 53  to display perceived improvements in ability to complete ADL's.    Baseline 07/21/2021= 45; 1/5=51. 10/12/2021- Just reassessed for progress note so did not reassess again- will continue with goal into new certification 2/11: 94%    Time 12    Period Weeks    Status Achieved    Target Date 03/30/22      PT LONG TERM GOAL #2   Title Pt will improve BERG by at least 5 points in order to demonstrate clinically significant improvement in balance.    Baseline 07/21/2021= 39/56; 08/21/2021=46/56- Will keep goal active to ensure patient able to test consistently; 10/05/2021= 52/56    Time 12    Period Weeks    Status Achieved    Target Date 10/13/21      PT LONG TERM GOAL #3   Title Pt will decrease 5TSTS by at least 5 seconds in order to demonstrate clinically significant improvement in LE strength.    Baseline 07/21/2021= 21 sec without UE Support; 11/21= 14.0 sec without UE support-.Will Keep goal active to ensure consistency.  10/05/2021=14.75 sec without UE support  (improved from 21 sec and unchanged from last assessment of 14 sec)    Time 12    Period Weeks    Status Achieved    Target Date 10/13/21      PT LONG TERM GOAL #4   Title Pt will decrease TUG to below  18 seconds/decrease in order to demonstrate decreased fall risk.    Baseline 07/21/2021= 23 sec with 4WW; 08/21/2021= 22.13 sec with 4WW; 10/05/2021= 19.51 sec avg with 4WW 2/10: 15.4 seconds with cane  12/14/2021= 18.0 sec    Time 12    Period Weeks    Status Achieved    Target Date 01/05/22      PT LONG TERM GOAL #5   Title Pt will increase 10MWT by at least 0.23  m/s in order to demonstrate clinically significant improvement in community ambulation.    Baseline 07/21/2021= 0.59ms; 08/21/2021= 0.57 m/s using 4WW; 10/05/2021= 0.6 m/s avg using 4WW 2/10: 0.51 m/s with cane. 12/14/2021= 0.65 m/s using SPC    Time 12    Period Weeks    Status Partially Met    Target Date 03/30/22      PT LONG TERM GOAL #6   Title Patient will increase six minute walk test distance to > 100 for progression to community ambulator and improve gait ability    Baseline 01/05/2022= 495 feet using SPC; 01/29/2022= 470 feet in 4 min 30 sec using SPC    Time 12    Period Weeks    Status On-going    Target Date 03/30/22                   Plan - 03/16/22 0857     Clinical Impression Statement Patient presents with excellent motivation for today's session. He performed well with introduction to octane trainer today and performed well ambulating with resistance. He exhibited decreased foot drag with all activities but particularly walking today. He also presented with less overall fatigue and improved concentration on left side today. Patient will benefit from continued PT services to focus on LE strength and safety with mobility to improve overall mobility, decrease fall risk, and improve quality of life.    Personal Factors and Comorbidities Comorbidity 3+    Comorbidities A- Fib, Renal cancer, Addison's disease, gout, arthritis, DM    Examination-Activity  Limitations Caring for Others;Carry;Dressing;Lift;Reach Overhead;Squat;Stairs;Transfers;Stand    Examination-Participation Restrictions Investment banker, operational;Yard Work;Shop    Stability/Clinical Decision Making Evolving/Moderate complexity    Rehab Potential Good    PT Frequency 1x / week    PT Duration 12 weeks    PT Treatment/Interventions ADLs/Self Care Home Management;Canalith Repostioning;Cryotherapy;Electrical Stimulation;Moist Heat;Traction;Ultrasound;DME Instruction;Gait training;Stair  training;Functional mobility training;Therapeutic activities;Therapeutic exercise;Balance training;Neuromuscular re-education;Patient/family education;Orthotic Fit/Training;Wheelchair mobility training;Manual techniques;Passive range of motion;Dry needling;Energy conservation;Taping;Vestibular    PT Next Visit Plan Continue with progress LE strengthening; gait training for safe mobility, Neuromuscular re-ed for improved overall balance.  Continue plan of care as previously indicated    PT Home Exercise Plan 08/17/2021-Access Code: [email or web address]; no updates    Consulted and Agree with Plan of Care Patient;Family member/caregiver    Family Member Consulted WIfe- [name]             Patient will benefit from skilled therapeutic intervention in order to improve the following deficits and impairments:  Abnormal gait, Decreased activity tolerance, Decreased balance, Decreased coordination, Decreased endurance, Decreased knowledge of use of DME, Decreased mobility, Decreased strength, Difficulty walking, Hypomobility, Impaired sensation, Impaired vision/preception, Postural dysfunction, Pain  Visit Diagnosis: Abnormality of gait and mobility  Difficulty in walking, not elsewhere classified  Muscle weakness (generalized)  Other abnormalities of gait and mobility  Other lack of coordination  Unsteadiness on feet  Repeated falls     Problem List Patient Active Problem List   Diagnosis Date Noted   Addisons disease ([place]) 03/16/2021   Low HDL (under 40) 03/16/2021   Difficulty sleeping 11/21/2020   Numbness and tingling 11/21/2020   Difficulty walking 01/14/2020   History of recent fall 01/14/2020   Sleep disorder 01/14/2020   Pancreatic mass 08/01/2018   H/O sebaceous cyst 01/06/2018   Tubular adenoma 10/28/2017   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   Edema leg 03/07/2017   Hx of atrial flutter 03/07/2017   OSA (obstructive sleep apnea) 07/24/2016   RLS (restless legs syndrome)  07/24/2016   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   Atrial fibrillation and flutter ([place]) 10/20/2014   Hyperlipidemia 06/16/2014   Adrenal crisis ([place]) 04/19/2014   Cough 03/29/2014   Weakness 03/29/2014   Cough syncope 10/15/2013   URI (upper respiratory infection) 10/15/2013   Dyslipidemia 09/04/2013   Gout 09/04/2013   Chronic kidney disease, stage III (moderate) ([place]) 11/13/2011   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   Hypothyroidism (acquired) 11/30/2010   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[place]) 11/10/2010    Lewis Moccasin, PT 03/16/2022, 9:44 AM  [facility] MAIN [facility] SERVICES [address] [place], [place], [address] Phone: [phone]   Fax:  [phone]  Name: Tony Huffman MRN: [id number] Date of Birth: 08-10-1946

## 2022-03-23 ENCOUNTER — Ambulatory Visit: Payer: Medicare Other

## 2022-03-23 DIAGNOSIS — R278 Other lack of coordination: Secondary | ICD-10-CM

## 2022-03-23 DIAGNOSIS — R262 Difficulty in walking, not elsewhere classified: Secondary | ICD-10-CM

## 2022-03-23 DIAGNOSIS — R296 Repeated falls: Secondary | ICD-10-CM

## 2022-03-23 DIAGNOSIS — M6281 Muscle weakness (generalized): Secondary | ICD-10-CM

## 2022-03-23 DIAGNOSIS — R2689 Other abnormalities of gait and mobility: Secondary | ICD-10-CM

## 2022-03-23 DIAGNOSIS — R2681 Unsteadiness on feet: Secondary | ICD-10-CM

## 2022-03-23 DIAGNOSIS — R269 Unspecified abnormalities of gait and mobility: Secondary | ICD-10-CM

## 2022-03-26 ENCOUNTER — Other Ambulatory Visit: Payer: Medicare Other | Admitting: Student

## 2022-03-26 DIAGNOSIS — G259 Extrapyramidal and movement disorder, unspecified: Secondary | ICD-10-CM

## 2022-03-26 DIAGNOSIS — R2689 Other abnormalities of gait and mobility: Secondary | ICD-10-CM

## 2022-03-26 DIAGNOSIS — E1165 Type 2 diabetes mellitus with hyperglycemia: Secondary | ICD-10-CM

## 2022-03-26 DIAGNOSIS — R269 Unspecified abnormalities of gait and mobility: Secondary | ICD-10-CM

## 2022-03-26 DIAGNOSIS — Z515 Encounter for palliative care: Secondary | ICD-10-CM

## 2022-03-26 DIAGNOSIS — C649 Malignant neoplasm of unspecified kidney, except renal pelvis: Secondary | ICD-10-CM

## 2022-03-30 ENCOUNTER — Ambulatory Visit: Payer: Medicare Other

## 2022-03-30 DIAGNOSIS — R278 Other lack of coordination: Secondary | ICD-10-CM

## 2022-03-30 DIAGNOSIS — M6281 Muscle weakness (generalized): Secondary | ICD-10-CM

## 2022-03-30 DIAGNOSIS — R2681 Unsteadiness on feet: Secondary | ICD-10-CM

## 2022-03-30 DIAGNOSIS — R2689 Other abnormalities of gait and mobility: Secondary | ICD-10-CM

## 2022-03-30 DIAGNOSIS — R262 Difficulty in walking, not elsewhere classified: Secondary | ICD-10-CM

## 2022-03-30 DIAGNOSIS — R269 Unspecified abnormalities of gait and mobility: Secondary | ICD-10-CM

## 2022-03-30 NOTE — Therapy (Signed)
OUTPATIENT PHYSICAL THERAPY TREATMENT NOTE   Patient Name: Tony Huffman MRN: [id number] DOB:10/22/1945, 76 y.o., male Today's Date: 03/30/2022  PCP: Sherrie Mustache, NP REFERRING PROVIDER: Dr. Jennings Books   PT End of Session - 03/30/22 0853     Visit Number 41    Number of Visits 60    Date for PT Re-Evaluation 06/22/22    Authorization Type Medicare Part A & B; BCBS Secondary    Authorization Time Period 01/05/2022-03/30/2022; 03/30/22-06/22/22    Progress Note Due on Visit 60    PT Start Time 0849    PT Stop Time 0929    PT Time Calculation (min) 40 min    Equipment Utilized During Treatment Gait belt    Activity Tolerance Patient tolerated treatment well;No increased pain;Patient limited by fatigue    Behavior During Therapy [facility] for tasks assessed/performed             Past Medical History:  Diagnosis Date   Abnormal heart rhythm 10/20/2014   Addison's disease ([place])    Adrenal insufficiency ([place])    Allergy    Ambulates with cane    Anxiety    Arrhythmia    Cataract cortical, senile    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   Chronic kidney disease, stage 3 ([place]) 11/13/2011   Cough 03/29/2014   Edema leg 03/07/2017   Elevated cholesterol with high triglycerides 09/04/2013   Elevated red blood cell count    Erectile dysfunction    Falls frequently    Fever 03/29/2014   Generalized weakness 03/29/2014   GERD (gastroesophageal reflux disease)    Gout 09/04/2013   H/O adrenal insufficiency 05/17/2014   H/O atrial flutter 03/07/2017   H/O atrial flutter    H/O eye surgery    H/O sebaceous cyst 01/06/2018   H/O thyroid disease    H/O upper respiratory infection 10/15/2013   Hearing loss    History of back surgery    History of blepharoplasty    History of cancer 10/04/2015   History of chemotherapy    History of esophagogastroduodenoscopy (EGD)    History of prediabetes    Hyperlipidemia 06/16/2014   Hypertension    Hypertriglyceridemia    Hypothyroidism  (acquired) 04/14/2014   Left leg swelling 11/23/2016   Low HDL (under 40)    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[place]) 11/10/2010   Malignant neoplasm of unspecified kidney, except renal pelvis (HCC)    OSA (obstructive sleep apnea) 07/24/2016   OSA (obstructive sleep apnea)    PAF (paroxysmal atrial fibrillation) (HCC)    PAF (paroxysmal atrial fibrillation) ([place])    Pancreatic mass 08/01/2018   Renal carcinoma ([place]) 06/16/2015   RLS (restless legs syndrome) 07/24/2016   Sleep apnea    Testicular hypofunction    Tubular adenoma 10/28/2017   Tussive syncopes 10/15/2013   Type 2 diabetes mellitus ([place])    Vitamin D deficiency disease    Past Surgical History:  Procedure Laterality Date   ADRENALECTOMY     CATARACT EXTRACTION  10/2014   CATARACT EXTRACTION EXTRACAPSULAR  11/05/2016   with Insertion Intraocular Prostheis.    COLONOSCOPY  05/06/2017   with removal lesions by snare.   COLONOSCOPY W/ BIOPSIES     05/06/2017   NEPHRECTOMY     POPLITEAL SYNOVIAL CYST EXCISION     SPINE SURGERY  1989   THYROIDECTOMY     THYROIDECTOMY, PARTIAL     TONSILLECTOMY     Patient Active Problem List   Diagnosis Date Noted  Addisons disease ([place]) 03/16/2021   Low HDL (under 40) 03/16/2021   Difficulty sleeping 11/21/2020   Numbness and tingling 11/21/2020   Difficulty walking 01/14/2020   History of recent fall 01/14/2020   Sleep disorder 01/14/2020   Pancreatic mass 08/01/2018   H/O sebaceous cyst 01/06/2018   Tubular adenoma 10/28/2017   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   Edema leg 03/07/2017   Hx of atrial flutter 03/07/2017   OSA (obstructive sleep apnea) 07/24/2016   RLS (restless legs syndrome) 07/24/2016   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   Atrial fibrillation and flutter ([place]) 10/20/2014   Hyperlipidemia 06/16/2014   Adrenal crisis ([place]) 04/19/2014   Cough 03/29/2014   Weakness 03/29/2014   Cough syncope 10/15/2013   URI (upper respiratory  infection) 10/15/2013   Dyslipidemia 09/04/2013   Gout 09/04/2013   Chronic kidney disease, stage III (moderate) ([place]) 11/13/2011   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   Hypothyroidism (acquired) 11/30/2010   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[place]) 11/10/2010    REFERRING DIAG: Unspecified abnormalities of gait and mobility  THERAPY DIAG:  Abnormality of gait and mobility  Difficulty in walking, not elsewhere classified  Muscle weakness (generalized)  Other abnormalities of gait and mobility  Other lack of coordination  Unsteadiness on feet  Rationale for Evaluation and Treatment Rehabilitation  PERTINENT HISTORY: Patient is a 76 year old male with referral from Neurology- Dr. Joselyn Arrow for unspecified abnormality of gait. He presents today with his wife and reports > 6 month history of progressive issues with poor balance and multiple falls. Patient has renal Cancer and currently has Pallative care- Was stabilized out of Hospice. Patient presents with the following past medical history: A-Fib and flutter, Lung Cancer, Obstructive sleep apnea, Upper respiratory infection, Addison's disease, Hypothyroidism, Renal Cancer, DM, gout, arthritis, Chronic kidney disease and Restless leg syndrome. Patient lives with wife in independent living at [facility]. Prior level of function was independent with transfers and mobility using walker yet some assist with dressing.   PRECAUTIONS: falls   Subjective Assessment - 03/30/22 0852     Subjective Pt/SO reports balance still seems worse than typical over past two weeks. Denies any falls. Has been using rollator more.    Pertinent History Patient is a 76 year old male with referral from Neurology- Dr. Joselyn Arrow for unspecified abnormality of gait. He presents today with his wife and reports > 6 month history of progressive issues with poor balance and multiple falls. Patient has renal Cancer and currently has Pallative care- Was stabilized out of  Hospice. Patient presents with the following past medical history: A-Fib and flutter, Lung Cancer, Obstructive sleep apnea, Upper respiratory infection, Addison's disease, Hypothyroidism, Renal Cancer, DM, gout, arthritis, Chronic kidney disease and Restless leg syndrome. Patient lives with wife in independent living at [facility]. Prior level of function was independent with transfers and mobility using walker yet some assist with dressing.    Currently in Pain? No/denies                TREATMENT:   03/30/22: -Nustep: Seat 10, arms 13; Level 2 x 3 minutes, level 4 x 4 minutes (difficulty achieving SPM goal)  -Forward walking backward in // bars c 5lb AW Left 5RT *sit break  -lateral stepping in // bars c 5lb AW Left 4RT *sit break -forward/backward AMB in // bars over red mat 4RT  *sit break -lateral stepping in // bars over red mat 3RT -seated marching 1x30 , 5lb  AW Left -standing 12" step taps 1x15 with Left AW 5lb  *minguardA for STS safety between exercises, as well as 4WW navigation in clinic between stations.     03/23/2022 Therex:  Nustep- LE only - L3 x 3 min L5 x 3 min= Total distance = 0.14 mi  Gait with 5lb AW on left LE in // bars Forward x 10 down and back - focusing on reciprocal steps and VC to take longer step backward with left LE -Ladder drill Gait with 5lb AW on left LE in // bars Forward x 10 down and back - focusing on reciprocal steps and VC to take longer step backward with left LE  - Step ups with 5lb AW left LE x 12 reps  Step up with 5 lb AW on Left with right LE step up and then high knee on left toward bar- x 15 reps   Heel to toe gait swing with GTB x 15 reps each LE   Sit to stand with Right  LE on yellow Dynadisc to emphazize power to left LE- Increased difficulty yet able to complete 10 reps.   Education provided throughout session via VC/TC and demonstration to facilitate movement at target joints and correct muscle activation for all testing  and exercises performed.     PATIENT EDUCATION: Education details: exercise technique Person educated: Patient Education method: Explanation, Demonstration, Tactile cues, and Verbal cues Education comprehension: verbalized understanding, returned demonstration, verbal cues required, tactile cues required, and needs further education   HOME EXERCISE PROGRAM: No updates in today's session    PT Short Term Goals - 03/30/22 1003       PT SHORT TERM GOAL #1   Title Pt will be independent with HEP in order to improve strength and balance in order to decrease fall risk and improve function at home and work.    Baseline 07/21/2021- Paitent presents with no formal HEP in place. 08/21/2021- Patient reports compliance with HEP and states no questions as of today- able to perform standin using his rollator for support.    Time 6    Period Weeks    Status Achieved    Target Date 09/01/21             PT Long Term Goals - 03/30/22 1003       PT LONG TERM GOAL #1   Title Pt will achieve FOTO score of >52 to display perceived improvements in ability to complete ADL's.    Baseline 07/21/2021= 45; 1/5=51. 10/12/2021- Just reassessed for progress note so did not reassess again- will continue with goal into new certification 3/15: 17%    Time 12    Period Weeks    Status Revised    Target Date 06/22/22      PT LONG TERM GOAL #2   Title Pt will score Berg Balance Test >51 to indicated decreased falls risk.    Baseline 07/21/2021= 39/56; 08/21/2021=46/56- Will keep goal active to ensure patient able to test consistently; 10/05/2021= 52/56; 6/30: needs retest    Time 12    Period Weeks    Status Revised    Target Date 06/22/22      PT LONG TERM GOAL #3   Title Pt will score 5TSTS  <12s hands-free in order to demonstrate clinically significant improvement in LE power.    Baseline 10/05/2021=14.75 sec without UE support;    Time 12    Period Weeks    Status On-going    Target Date 06/22/22  PT LONG TERM GOAL #4   Title Pt will perform TUG <15 seconds in order to demonstrate decreased fall risk.    Baseline 12/14/2021= 18.0 sec c SPC    Time 12    Period Weeks    Status New    Target Date 06/22/22      PT LONG TERM GOAL #5   Title Pt will perform 10MWT @ >0.69ms in order to facilitate ablity to perform AMB in community.    Baseline 12/14/2021= 0.65 m/s using SPC    Time 12    Period Weeks    Status New    Target Date 06/22/22      PT LONG TERM GOAL #6   Title Patient will perform six minute walk test > 10028fto improve ability to safely navigate community for IADL.    Baseline 01/05/2022= 495 feet using SPC; 01/29/2022= 470 feet in 4 min 30 sec using SPC    Time 12    Period Weeks    Status On-going    Target Date 06/22/22                  Plan - 03/30/22 0905     Clinical Impression Statement Continued to work on gait control and LLE awareness. Similar to previous sessions, Pt maintains symmetrical control of LLE foot clearance for 1-2 minutes with a rapid decline in performance and control despite cues for correction and off loading. Pt remain focus and motivated to improve his safety in ADL mobility. Patient will benefit from continued PT services to focus on LE strength and safety with mobility to improve overall mobility, decrease fall risk, and improve quality of life.    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, [employer]ard Work;Shop    Stability/Clinical Decision Making Evolving/Moderate complexity    Clinical Decision Making Moderate    Rehab Potential Good    PT Frequency 1x / week    PT Duration 12 weeks    PT Treatment/Interventions ADLs/Self Care Home Management;Canalith  Repostioning;Cryotherapy;Electrical Stimulation;Moist Heat;Traction;Ultrasound;DME Instruction;Gait training;Stair training;Functional mobility training;Therapeutic activities;Therapeutic exercise;Balance training;Neuromuscular re-education;Patient/family education;Orthotic Fit/Training;Wheelchair mobility training;Manual techniques;Passive range of motion;Dry needling;Energy conservation;Taping;Vestibular    PT Next Visit Plan Needs 6MWT, BBT, 5xSTS for goals. Continue with progress LE strengthening; gait training for safe mobility, Neuromuscular re-ed for improved overall balance.  Continue plan of care as previously indicated    PT Home Exercise Plan 08/17/2021-Access Code: [email or web address]; no updates    Consulted and Agree with Plan of Care Patient;Family member/caregiver    Family Member Consulted Wife- [name]                 10:00 AM, 03/30/22 AlEtta GrandchildPT, DPT Physical Therapist - [phone]    Zakhari Fogel C, PT 03/30/2022, 10:00 AM

## 2022-04-04 ENCOUNTER — Telehealth: Payer: Self-pay

## 2022-04-04 NOTE — Telephone Encounter (Signed)
Jancee Myner the IL RN at [facility] [employer] 1946/04/10 had gout flare, wife out of town. She is filling out an FL2 form. She is wondering if she sends it today can she get it back today. So that the patient can go to their respite room. [phone]  Confirmed with Dinah we are unable to guarantee that it would be back to her today.  Called and left her a voicemail.

## 2022-04-05 ENCOUNTER — Ambulatory Visit: Payer: Medicare Other | Admitting: Nurse Practitioner

## 2022-04-05 ENCOUNTER — Encounter: Payer: Self-pay | Admitting: Nurse Practitioner

## 2022-04-05 VITALS — BP 118/76 | HR 75 | Temp 98.4°F | Ht 68.0 in | Wt 199.0 lb

## 2022-04-05 DIAGNOSIS — M109 Gout, unspecified: Secondary | ICD-10-CM | POA: Diagnosis not present

## 2022-04-05 NOTE — Patient Instructions (Addendum)
To repeat colchicine 0.6 mg by mouth now, may have additional dose tonight if needed  Hold for diarrhea.     Please come to [facility] MONDAY July 17th at 7:30 prior to your appt for lab work.

## 2022-04-05 NOTE — Progress Notes (Signed)
Careteam: Patient Care Team: Tony Chandler, NP as PCP - General (Geriatric Medicine) [name], MD as Physician Assistant (Endocrinology) [name], MD as Referring Physician (Neurology)  Advanced Directive information Does Patient Have a Medical Advance Directive?: Yes, Type of Advance Directive: Out of facility DNR (pink MOST or yellow form), Pre-existing out of facility DNR order (yellow form or pink MOST form): Pink MOST form placed in chart (order not valid for inpatient use);Yellow form placed in chart (order not valid for inpatient use), Does patient want to make changes to medical advance directive?: No - Patient declined  Allergies  Allergen Reactions   Other Rash    Blisters with bandaids Blisters with bandaids    Tegaderm Ag Mesh [Silver]    Tape Rash    Blisters, rash. Paper tape OK. Blisters, rash. Paper tape OK.     Chief Complaint  Patient presents with   Acute Visit    Gout flare up. Moderate fall risk. Requesting clarity on how often he Tony Huffman take colchicine.      HPI: Patient is a 76 y.o. male seen in today at the [facility] for gout flare.  Started night before last.  Took colchicine 1.2 mg yesterday. Somewhat better today. Could not put his foot on the ground. Now able to walk.  Pain level is better than yesterday but still there.  Foot is red with swelling in joint of base of toe.  No diarrhea. No fever He is currently under respite care and needs orders for his colchicine.   Review of Systems:  Review of Systems  Gastrointestinal:  Negative for abdominal pain, constipation and diarrhea.  Musculoskeletal:  Positive for joint pain and myalgias.    Past Medical History:  Diagnosis Date   Abnormal heart rhythm 10/20/2014   Addison's disease ([place])    Adrenal insufficiency (HCC)    Allergy    Ambulates with cane    Anxiety    Arrhythmia    Cataract cortical, senile    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   Chronic kidney  disease, stage 3 ([place]) 11/13/2011   Cough 03/29/2014   Edema leg 03/07/2017   Elevated cholesterol with high triglycerides 09/04/2013   Elevated red blood cell count    Erectile dysfunction    Falls frequently    Fever 03/29/2014   Generalized weakness 03/29/2014   GERD (gastroesophageal reflux disease)    Gout 09/04/2013   H/O adrenal insufficiency 05/17/2014   H/O atrial flutter 03/07/2017   H/O atrial flutter    H/O eye surgery    H/O sebaceous cyst 01/06/2018   H/O thyroid disease    H/O upper respiratory infection 10/15/2013   Hearing loss    History of back surgery    History of blepharoplasty    History of cancer 10/04/2015   History of chemotherapy    History of esophagogastroduodenoscopy (EGD)    History of prediabetes    Hyperlipidemia 06/16/2014   Hypertension    Hypertriglyceridemia    Hypothyroidism (acquired) 04/14/2014   Left leg swelling 11/23/2016   Low HDL (under 40)    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[place]) 11/10/2010   Malignant neoplasm of unspecified kidney, except renal pelvis (HCC)    OSA (obstructive sleep apnea) 07/24/2016   OSA (obstructive sleep apnea)    PAF (paroxysmal atrial fibrillation) (HCC)    PAF (paroxysmal atrial fibrillation) ([place])    Pancreatic mass 08/01/2018   Renal carcinoma ([place]) 06/16/2015   RLS (restless  legs syndrome) 07/24/2016   Sleep apnea    Testicular hypofunction    Tubular adenoma 10/28/2017   Tussive syncopes 10/15/2013   Type 2 diabetes mellitus ([place])    Vitamin D deficiency disease    Past Surgical History:  Procedure Laterality Date   ADRENALECTOMY     CATARACT EXTRACTION  10/2014   CATARACT EXTRACTION EXTRACAPSULAR  11/05/2016   with Insertion Intraocular Prostheis.    COLONOSCOPY  05/06/2017   with removal lesions by snare.   COLONOSCOPY W/ BIOPSIES     05/06/2017   NEPHRECTOMY     POPLITEAL SYNOVIAL CYST EXCISION     SPINE SURGERY  1989   THYROIDECTOMY     THYROIDECTOMY, PARTIAL      TONSILLECTOMY     Social History:   reports that he quit smoking about 45 years ago. His smoking use included cigarettes. He has a 10.00 pack-year smoking history. He has never used smokeless tobacco. He reports that he does not currently use alcohol. He reports that he does not use drugs.  Family History  Problem Relation Age of Onset   Ovarian cancer Mother    Cancer Mother    Cancer Father    Heart disease Father    Coronary artery disease Father    Hypertension Father    Macular degeneration Father    Heart disease Brother    Diabetes Brother    Coronary artery disease Brother    Diabetes type II Brother    Coronary artery disease Paternal Grandfather     Medications: Patient's Medications  New Prescriptions   No medications on file  Previous Medications   ACCU-CHEK SOFTCLIX LANCETS LANCETS    Use to check blood sugar once daily. Dx: E11.49   BLOOD GLUCOSE METER KIT AND SUPPLIES    1 each by Other route as directed. Dispense based on patient and insurance preference. Use up to four times daily as directed. (FOR ICD-10 E10.9, E11.9).   CHOLECALCIFEROL 125 MCG (5000 UT) CAPSULE    Take 5,000 Units by mouth daily.   COLCHICINE 0.6 MG TABLET    Take 1 tablet (0.6 mg total) by mouth as needed. First sign of Gout Flare. Take 2 tablets ( 1.329m) by mouth at first sign of gout flare followed by 1 tablet ( 0.622m after 1 hour. ( max 1.29m5mithin 1 hours)   CYANOCOBALAMIN 1000 MCG TABLET    Take 1,000 mcg by mouth every other day.   FLUDROCORTISONE ACETATE (FLORINEF PO)    Take 0.1 mg by mouth every other day. On alternate days take 1/2 tablet.   FUROSEMIDE (LASIX) 20 MG TABLET    Take 20 mg by mouth as needed. For Edema.   GABAPENTIN (NEURONTIN) 600 MG TABLET    Take 900 mg by mouth daily. Takes 1 and 1/2 tablet by mouth nightly.   GLUCOSE BLOOD (ACCU-CHEK AVIVA PLUS) TEST STRIP    Use to test blood sugar once daily. Dx: E11.49   HYDROCORTISONE (CORTEF) 10 MG TABLET    Take by mouth  daily. Takes 1 and 1/2 tablet every morning, 1/2 tablet every afternoon, and 1/2 tablet every evening. Take double dose as directed for stress, may take up to 85 tablets monthly.   LEVOTHYROXINE (SYNTHROID) 75 MCG TABLET    Take 75 mcg by mouth daily.   METFORMIN (GLUMETZA) 500 MG (MOD) 24 HR TABLET    Take 500 mg by mouth daily.   MULTIPLE VITAMINS-MINERALS (PRESERVISION AREDS 2 PO)    Take  1 capsule by mouth in the morning and at bedtime.   PSYLLIUM HUSK PO    Take 3.4 g by mouth every evening.  Modified Medications   No medications on file  Discontinued Medications   DOXYCYCLINE (VIBRA-TABS) 100 MG TABLET    Take 1 tablet (100 mg total) by mouth 2 (two) times daily.   LIDOCAINE (XYLOCAINE) 5 % OINTMENT    Apply 1 application topically as needed.   NORTRIPTYLINE (PAMELOR) 10 MG CAPSULE    2 nightly   PREDNISONE (STERAPRED UNI-PAK 21 TAB) 10 MG (21) TBPK TABLET    Use as directed    Physical Exam:  Vitals:   04/05/22 1016  BP: 118/76  Pulse: 75  Temp: 98.4 F (36.9 C)  SpO2: 97%  Weight: 199 lb (90.3 kg)  Height: _0  (1.727 m)   Body mass index is 30.26 kg/m. Wt Readings from Last 3 Encounters:  04/05/22 199 lb (90.3 kg)  10/10/21 206 lb (93.4 kg)  07/21/21 201 lb (91.2 kg)    Physical Exam Constitutional:      Appearance: Normal appearance.  Musculoskeletal:     Right foot: Bony tenderness present.     Comments: Redness, pain and swelling to MP joint of right toe   Skin:    General: Skin is warm.  Neurological:     Mental Status: He is alert. Mental status is at baseline.  Psychiatric:        Mood and Affect: Mood normal.     Labs reviewed: Basic Metabolic Panel: Recent Labs    04/07/21 0000 08/14/21 0000  NA  --  139  K  --  4.8  CL  --  103  CO2  --  29*  BUN  --  18  CREATININE  --  1.1  CALCIUM  --  9.1  TSH 2.51 3.34   Liver Function Tests: No results for input(s): "AST", "ALT", "ALKPHOS", "BILITOT", "PROT", "ALBUMIN" in the last 8760  hours. No results for input(s): "LIPASE", "AMYLASE" in the last 8760 hours. No results for input(s): "AMMONIA" in the last 8760 hours. CBC: No results for input(s): "WBC", "NEUTROABS", "HGB", "HCT", "MCV", "PLT" in the last 8760 hours. Lipid Panel: No results for input(s): "CHOL", "HDL", "LDLCALC", "TRIG", "CHOLHDL", "LDLDIRECT" in the last 8760 hours. TSH: Recent Labs    04/07/21 0000 08/14/21 0000  TSH 2.51 3.34   A1C: Lab Results  Component Value Date   HGBA1C 7.2 08/14/2021     Assessment/Plan 1. Acute gout of right foot, unspecified cause -some better today after colchicine, will have him take additional tablet this am and can repeat in the PM if needed -low purine diet recommended. Will follow up uric acid level with next blood work.    Carlos American. [place], [facility] [phone]

## 2022-04-06 ENCOUNTER — Ambulatory Visit: Payer: Medicare Other

## 2022-04-13 ENCOUNTER — Ambulatory Visit: Payer: Medicare Other | Attending: Neurology

## 2022-04-13 DIAGNOSIS — M6281 Muscle weakness (generalized): Secondary | ICD-10-CM | POA: Diagnosis present

## 2022-04-13 DIAGNOSIS — R2689 Other abnormalities of gait and mobility: Secondary | ICD-10-CM

## 2022-04-13 DIAGNOSIS — R296 Repeated falls: Secondary | ICD-10-CM

## 2022-04-13 DIAGNOSIS — R269 Unspecified abnormalities of gait and mobility: Secondary | ICD-10-CM

## 2022-04-13 DIAGNOSIS — R262 Difficulty in walking, not elsewhere classified: Secondary | ICD-10-CM | POA: Diagnosis present

## 2022-04-13 DIAGNOSIS — R2681 Unsteadiness on feet: Secondary | ICD-10-CM | POA: Diagnosis present

## 2022-04-13 DIAGNOSIS — R278 Other lack of coordination: Secondary | ICD-10-CM | POA: Diagnosis present

## 2022-04-13 NOTE — Therapy (Signed)
OUTPATIENT PHYSICAL THERAPY TREATMENT NOTE   Patient Name: Tony Huffman MRN: [id number] DOB:[birth year], 76 y.o., male Today's Date: 04/13/2022  PCP: Sherrie Mustache, NP REFERRING PROVIDER: Dr. Jennings Books   PT End of Session - 04/13/22 0834     Visit Number 56    Number of Visits 81    Date for PT Re-Evaluation 06/22/22    Authorization Type Medicare Part A & B; BCBS Secondary    Authorization Time Period 01/05/2022-03/30/2022; 03/30/22-06/22/22    Progress Note Due on Visit 60    PT Start Time 0844    PT Stop Time 0929    PT Time Calculation (min) 45 min    Equipment Utilized During Treatment Gait belt    Activity Tolerance Patient tolerated treatment well;No increased pain    Behavior During Therapy [facility] for tasks assessed/performed             Past Medical History:  Diagnosis Date   Abnormal heart rhythm 10/20/2014   Addison's disease ([place])    Adrenal insufficiency ([place])    Allergy    Ambulates with cane    Anxiety    Arrhythmia    Cataract cortical, senile    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   Chronic kidney disease, stage 3 ([place]) 11/13/2011   Cough 03/29/2014   Edema leg 03/07/2017   Elevated cholesterol with high triglycerides 09/04/2013   Elevated red blood cell count    Erectile dysfunction    Falls frequently    Fever 03/29/2014   Generalized weakness 03/29/2014   GERD (gastroesophageal reflux disease)    Gout 09/04/2013   H/O adrenal insufficiency 05/17/2014   H/O atrial flutter 03/07/2017   H/O atrial flutter    H/O eye surgery    H/O sebaceous cyst 01/06/2018   H/O thyroid disease    H/O upper respiratory infection 10/15/2013   Hearing loss    History of back surgery    History of blepharoplasty    History of cancer 10/04/2015   History of chemotherapy    History of esophagogastroduodenoscopy (EGD)    History of prediabetes    Hyperlipidemia 06/16/2014   Hypertension    Hypertriglyceridemia    Hypothyroidism (acquired) 04/14/2014   Left  leg swelling 11/23/2016   Low HDL (under 40)    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[place]) 11/10/2010   Malignant neoplasm of unspecified kidney, except renal pelvis (HCC)    OSA (obstructive sleep apnea) 07/24/2016   OSA (obstructive sleep apnea)    PAF (paroxysmal atrial fibrillation) (HCC)    PAF (paroxysmal atrial fibrillation) ([place])    Pancreatic mass 08/01/2018   Renal carcinoma ([place]) 06/16/2015   RLS (restless legs syndrome) 07/24/2016   Sleep apnea    Testicular hypofunction    Tubular adenoma 10/28/2017   Tussive syncopes 10/15/2013   Type 2 diabetes mellitus ([place])    Vitamin D deficiency disease    Past Surgical History:  Procedure Laterality Date   ADRENALECTOMY     CATARACT EXTRACTION  10/2014   CATARACT EXTRACTION EXTRACAPSULAR  11/05/2016   with Insertion Intraocular Prostheis.    COLONOSCOPY  05/06/2017   with removal lesions by snare.   COLONOSCOPY W/ BIOPSIES     05/06/2017   NEPHRECTOMY     POPLITEAL SYNOVIAL CYST EXCISION     SPINE SURGERY  1989   THYROIDECTOMY     THYROIDECTOMY, PARTIAL     TONSILLECTOMY     Patient Active Problem List   Diagnosis Date Noted   Addisons disease (  [place]) 03/16/2021   Low HDL (under 40) 03/16/2021   Difficulty sleeping 11/21/2020   Numbness and tingling 11/21/2020   Difficulty walking 01/14/2020   History of recent fall 01/14/2020   Sleep disorder 01/14/2020   Pancreatic mass 08/01/2018   H/O sebaceous cyst 01/06/2018   Tubular adenoma 10/28/2017   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   Edema leg 03/07/2017   Hx of atrial flutter 03/07/2017   OSA (obstructive sleep apnea) 07/24/2016   RLS (restless legs syndrome) 07/24/2016   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   Atrial fibrillation and flutter ([place]) 10/20/2014   Hyperlipidemia 06/16/2014   Adrenal crisis ([place]) 04/19/2014   Cough 03/29/2014   Weakness 03/29/2014   Cough syncope 10/15/2013   URI (upper respiratory infection) 10/15/2013    Dyslipidemia 09/04/2013   Gout 09/04/2013   Chronic kidney disease, stage III (moderate) ([place]) 11/13/2011   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   Hypothyroidism (acquired) 11/30/2010   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[place]) 11/10/2010    REFERRING DIAG: Unspecified abnormalities of gait and mobility  THERAPY DIAG:  Abnormality of gait and mobility  Difficulty in walking, not elsewhere classified  Muscle weakness (generalized)  Other abnormalities of gait and mobility  Other lack of coordination  Unsteadiness on feet  Repeated falls  Rationale for Evaluation and Treatment Rehabilitation  PERTINENT HISTORY: Patient is a 76 year old male with referral from Neurology- Dr. Joselyn Arrow for unspecified abnormality of gait. He presents today with his wife and reports > 6 month history of progressive issues with poor balance and multiple falls. Patient has renal Cancer and currently has Pallative care- Was stabilized out of Hospice. Patient presents with the following past medical history: A-Fib and flutter, Lung Cancer, Obstructive sleep apnea, Upper respiratory infection, Addison's disease, Hypothyroidism, Renal Cancer, DM, gout, arthritis, Chronic kidney disease and Restless leg syndrome. Patient lives with wife in independent living at [facility]. Prior level of function was independent with transfers and mobility using walker yet some assist with dressing.   PRECAUTIONS: falls  SUBJECTIVE: Pt reports 2 falls since previous session. One on July 5th, due to inability to put weight on R foot from gout flare up and one last night trying to descend curb with SPC in community leaving restaurant. Pt skinned medial aspect of R knee endorsing tenderness and along medial forefoot. Pt required assistance to stand up from ground but able to ambulate afterward without issue on R knee and L forefoot. Mild tenderness along foot, able to bear weight on foot, no swelling, color changes or erythema noted.  Wants to practice how to stand up from falls in future sessions.  PAIN:  Are you having pain? No     TODAY'S TREATMENT:   04/13/22:  There.ex:   Nu-Step Seat 10. L4 for 5 minutes maintaining SPM > 60 SPM for LE strengthening.    Alternating step ups forward with 6" step in // bars: x10 leading with RLE up and down for focus on eccentric control with descending curbs/stairs   Alternating lateral step ups R/L with 6" step in // bars: x10/direction. BUE support on // bar. VC's throughout for sequencing.    STS x10 with mod multimodal cuing for glut activation and upright posture.    Education provided to pt and spouse on possible postural strengthening in future sessions to prevent anterior trunk lean with standing and gait to reduce risk of falls.    Frequent seated rest b/t exercise due to LE fatigue  Neuro Re-Ed:   150' gait with SPC and CGA. X2 need for pt to stop and readjust SPC for upright posture and prevent L foot drag.    150' gait with rollator and CGA. Mod to max multimodal cuing to maintain rollator closer to BOS to prevent anterior trunk and and anterior falls. Pt generally unsafe with inability to keep rollator within BOS especially with pt dragging L foot as pt becomes fatigued. Pt at high risk for falls. Encouraged pt to ambulate with [facility] for improved safety, but to avoid asc/desc curbs and utilize ramps or flat entrances.    Practice asc/desc curb with 6" step and SPC in RUE. PT demo prior to completion with education on "up with the good, down with the bad". Pt practicing asc/desc curb for 10 minutes. Pt requiring CGA throughout with frequent VC's and education sequencing SPC with stepping up and down safely. Pt with difficulty with carryover in placing SPC on top of curb with ascending increasing unsteadiness with leading with LLE onto curb.       PATIENT EDUCATION: Education details: exercise technique. Safest DME currently to use.  Person educated:  Patient Education method: Explanation, Demonstration, Tactile cues, and Verbal cues Education comprehension: verbalized understanding, returned demonstration, verbal cues required, tactile cues required, and needs further education   HOME EXERCISE PROGRAM: No updates in today's session   PT Short Term Goals -       PT SHORT TERM GOAL #1   Title Pt will be independent with HEP in order to improve strength and balance in order to decrease fall risk and improve function at home and work.    Baseline 07/21/2021- Paitent presents with no formal HEP in place. 08/21/2021- Patient reports compliance with HEP and states no questions as of today- able to perform standin using his rollator for support.    Time 6    Period Weeks    Status Achieved    Target Date 09/01/21              PT Long Term Goals -      PT LONG TERM GOAL #1   Title Pt will improve FOTO to target score of 53  to display perceived improvements in ability to complete ADL's.    Baseline 07/21/2021= 45; 1/5=51. 10/12/2021- Just reassessed for progress note so did not reassess again- will continue with goal into new certification 2/58: 52%    Time 12    Period Weeks    Status Achieved    Target Date 03/30/22      PT LONG TERM GOAL #2   Title Pt will improve BERG by at least 5 points in order to demonstrate clinically significant improvement in balance.    Baseline 07/21/2021= 39/56; 08/21/2021=46/56- Will keep goal active to ensure patient able to test consistently; 10/05/2021= 52/56    Time 12    Period Weeks    Status Achieved    Target Date 10/13/21      PT LONG TERM GOAL #3   Title Pt will decrease 5TSTS by at least 5 seconds in order to demonstrate clinically significant improvement in LE strength.    Baseline 07/21/2021= 21 sec without UE Support; 11/21= 14.0 sec without UE support-.Will Keep goal active to ensure consistency.  10/05/2021=14.75 sec without UE support  (improved from 21 sec and unchanged from last  assessment of 14 sec)    Time 12    Period Weeks    Status Achieved    Target Date  10/13/21      PT LONG TERM GOAL #4   Title Pt will decrease TUG to below  18 seconds/decrease in order to demonstrate decreased fall risk.    Baseline 07/21/2021= 23 sec with 4WW; 08/21/2021= 22.13 sec with 4WW; 10/05/2021= 19.51 sec avg with 4WW 2/10: 15.4 seconds with cane  12/14/2021= 18.0 sec    Time 12    Period Weeks    Status Achieved    Target Date 01/05/22      PT LONG TERM GOAL #5   Title Pt will increase 10MWT by at least 0.23 m/s in order to demonstrate clinically significant improvement in community ambulation.    Baseline 07/21/2021= 0.41ms; 08/21/2021= 0.57 m/s using 4WW; 10/05/2021= 0.6 m/s avg using 4WW 2/10: 0.51 m/s with cane. 12/14/2021= 0.65 m/s using SPC    Time 12    Period Weeks    Status Partially Met    Target Date 03/30/22      PT LONG TERM GOAL #6   Title Patient will increase six minute walk test distance to > 100 for progression to community ambulator and improve gait ability    Baseline 01/05/2022= 495 feet using SPC; 01/29/2022= 470 feet in 4 min 30 sec using SPC    Time 12    Period Weeks    Status On-going    Target Date 03/30/22              Plan - 03/23/22 0855     Clinical Impression Statement Focus of session on reassessment of safest DME trialing SPC versus rollator. Pt remains at increased risk for falls with [facility]ator [facility] and nature of mobiltiy of rollator in combination of severe anterior trunk lean leading to very high risk for falls anteriorly. Remainder of session with focus on safe stair/curb navigation with [facility] had fall attempting to descend curb with SPC. Pt encouraged to utilize ramps with SPC currently to decrease risk of future falls. Pt would benefit from postural strengthening and motor control to assist in upright posture to prevent anterior LOB and falls. Pt will require progress note next session to address progress  towards goals.    Personal Factors and Comorbidities Comorbidity 3+    Comorbidities A- Fib, Renal cancer, Addison's disease, gout, arthritis, DM    Examination-Activity Limitations Caring for Others;Carry;Dressing;Lift;Reach Overhead;Squat;Stairs;Transfers;Stand    Examination-Participation Restrictions CInvestment banker, operationalYard Work;Shop    Stability/Clinical Decision Making Evolving/Moderate complexity    Rehab Potential Good    PT Frequency 1x / week    PT Duration 12 weeks    PT Treatment/Interventions ADLs/Self Care Home Management;Canalith Repostioning;Cryotherapy;Electrical Stimulation;Moist Heat;Traction;Ultrasound;DME Instruction;Gait training;Stair training;Functional mobility training;Therapeutic activities;Therapeutic exercise;Balance training;Neuromuscular re-education;Patient/family education;Orthotic Fit/Training;Wheelchair mobility training;Manual techniques;Passive range of motion;Dry needling;Energy conservation;Taping;Vestibular    PT Next Visit Plan Progress note, postural strengthening    PT Home Exercise Plan 08/17/2021-Access Code: [email or web address]; no updates    Consulted and Agree with Plan of Care Patient;Family member/caregiver    Family Member Consulted WIfe- MCarlean Jews Fairly IV, PT, DPT Physical Therapist- [facility] 04/13/2022, 10:39 AM

## 2022-04-17 ENCOUNTER — Encounter: Payer: Self-pay | Admitting: Nurse Practitioner

## 2022-04-17 ENCOUNTER — Ambulatory Visit: Payer: Medicare Other | Admitting: Nurse Practitioner

## 2022-04-17 VITALS — BP 126/74 | HR 74 | Temp 98.4°F | Ht 68.0 in | Wt 194.0 lb

## 2022-04-17 DIAGNOSIS — M109 Gout, unspecified: Secondary | ICD-10-CM | POA: Diagnosis not present

## 2022-04-17 DIAGNOSIS — R350 Frequency of micturition: Secondary | ICD-10-CM

## 2022-04-17 DIAGNOSIS — E2740 Unspecified adrenocortical insufficiency: Secondary | ICD-10-CM | POA: Diagnosis not present

## 2022-04-17 DIAGNOSIS — E890 Postprocedural hypothyroidism: Secondary | ICD-10-CM

## 2022-04-17 DIAGNOSIS — E1149 Type 2 diabetes mellitus with other diabetic neurological complication: Secondary | ICD-10-CM

## 2022-04-17 DIAGNOSIS — G4733 Obstructive sleep apnea (adult) (pediatric): Secondary | ICD-10-CM

## 2022-04-17 DIAGNOSIS — N401 Enlarged prostate with lower urinary tract symptoms: Secondary | ICD-10-CM

## 2022-04-17 DIAGNOSIS — C642 Malignant neoplasm of left kidney, except renal pelvis: Secondary | ICD-10-CM | POA: Diagnosis not present

## 2022-04-17 DIAGNOSIS — Z9989 Dependence on other enabling machines and devices: Secondary | ICD-10-CM

## 2022-04-17 MED ORDER — TAMSULOSIN HCL 0.4 MG PO CAPS: 0.4000 mg | ORAL_CAPSULE | Freq: Every day | ORAL | 3 refills | Status: DC

## 2022-04-17 NOTE — Progress Notes (Signed)
Careteam: Patient Care Team: [name], NP as PCP - General (Geriatric Medicine) [name], MD as Physician Assistant (Endocrinology) [name], MD as Referring Physician (Neurology)  Advanced Directive information Does Patient Have a Medical Advance Directive?: Yes, Type of Advance Directive: Out of facility DNR (pink MOST or yellow form), Pre-existing out of facility DNR order (yellow form or pink MOST form): Pink MOST form placed in chart (order not valid for inpatient use);Yellow form placed in chart (order not valid for inpatient use), Does patient want to make changes to medical advance directive?: No - Patient declined  Allergies  Allergen Reactions   Other Rash    Blisters with bandaids Blisters with bandaids    Tegaderm Ag Mesh [Silver]    Tape Rash    Blisters, rash. Paper tape OK. Blisters, rash. Paper tape OK.     Chief Complaint  Patient presents with   Medication Management    6 month follow-up. Discuss need for Hep C screening and shingrix (signed up at the pharmacy) or post pone if patient refuses.      HPI: Patient is a 76 y.o. male seen in today at the [facility] for routine follow up.   Gout flare has improved, joint still a little sore but improved.   He was in respite for 3 days while wife was away.   Last A1c of 7.2 in April. Followed up with endocrinology on Oct for recheck.  Blood sugars have been between 140-200s and these are random readings.   Reports increase in urinary frequency and feels like he is emptying and some post void dribble.  Reports weak stream when urinating. Getting up once or twice to urinate   He is getting treatment for macular degeneration. Injection syfovre.   He is followed by PT which is helping gait.   Continues to follow up with palliative care, comfort approach.    Review of Systems:  Review of Systems  Constitutional:  Negative for chills, fever and weight loss.  HENT:  Negative  for tinnitus.   Respiratory:  Negative for cough, sputum production and shortness of breath.   Cardiovascular:  Negative for chest pain, palpitations and leg swelling.  Gastrointestinal:  Negative for abdominal pain, constipation, diarrhea and heartburn.  Genitourinary:  Positive for frequency. Negative for dysuria and urgency.  Musculoskeletal:  Positive for joint pain. Negative for back pain, falls and myalgias.  Skin: Negative.   Neurological:  Positive for weakness. Negative for dizziness and headaches.  Psychiatric/Behavioral:  Negative for depression and memory loss. The patient does not have insomnia.    Past Medical History:  Diagnosis Date   Abnormal heart rhythm 10/20/2014   Addison's disease ([place])    Adrenal insufficiency (HCC)    Allergy    Ambulates with cane    Anxiety    Arrhythmia    Cataract cortical, senile    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   Chronic kidney disease, stage 3 ([place]) 11/13/2011   Cough 03/29/2014   Edema leg 03/07/2017   Elevated cholesterol with high triglycerides 09/04/2013   Elevated red blood cell count    Erectile dysfunction    Falls frequently    Fever 03/29/2014   Generalized weakness 03/29/2014   GERD (gastroesophageal reflux disease)    Gout 09/04/2013   H/O adrenal insufficiency 05/17/2014   H/O atrial flutter 03/07/2017   H/O atrial flutter    H/O eye surgery    H/O sebaceous cyst 01/06/2018  H/O thyroid disease    H/O upper respiratory infection 10/15/2013   Hearing loss    History of back surgery    History of blepharoplasty    History of cancer 10/04/2015   History of chemotherapy    History of esophagogastroduodenoscopy (EGD)    History of prediabetes    Hyperlipidemia 06/16/2014   Hypertension    Hypertriglyceridemia    Hypothyroidism (acquired) 04/14/2014   Left leg swelling 11/23/2016   Low HDL (under 40)    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[place]) 11/10/2010   Malignant neoplasm of unspecified kidney, except  renal pelvis (HCC)    OSA (obstructive sleep apnea) 07/24/2016   OSA (obstructive sleep apnea)    PAF (paroxysmal atrial fibrillation) (HCC)    PAF (paroxysmal atrial fibrillation) ([place])    Pancreatic mass 08/01/2018   Renal carcinoma ([place]) 06/16/2015   RLS (restless legs syndrome) 07/24/2016   Sleep apnea    Testicular hypofunction    Tubular adenoma 10/28/2017   Tussive syncopes 10/15/2013   Type 2 diabetes mellitus ([place])    Vitamin D deficiency disease    Past Surgical History:  Procedure Laterality Date   ADRENALECTOMY     CATARACT EXTRACTION  10/2014   CATARACT EXTRACTION EXTRACAPSULAR  11/05/2016   with Insertion Intraocular Prostheis.    COLONOSCOPY  05/06/2017   with removal lesions by snare.   COLONOSCOPY W/ BIOPSIES     05/06/2017   CYSTECTOMY     Upper Neck/Chest Area, [facility]   NEPHRECTOMY     POPLITEAL SYNOVIAL CYST EXCISION     SPINE SURGERY  1989   THYROIDECTOMY     THYROIDECTOMY, PARTIAL     TONSILLECTOMY     Social History:   reports that he quit smoking about 45 years ago. His smoking use included cigarettes. He has a 10.00 pack-year smoking history. He has never used smokeless tobacco. He reports that he does not currently use alcohol. He reports that he does not use drugs.  Family History  Problem Relation Age of Onset   Ovarian cancer Mother    Cancer Mother    Cancer Father    Heart disease Father    Coronary artery disease Father    Hypertension Father    Macular degeneration Father    Heart disease Brother    Diabetes Brother    Coronary artery disease Brother    Diabetes type II Brother    Coronary artery disease Paternal Grandfather     Medications: Patient's Medications  New Prescriptions   TAMSULOSIN (FLOMAX) 0.4 MG CAPS CAPSULE    Take 1 capsule (0.4 mg total) by mouth daily after supper.  Previous Medications   ACCU-CHEK SOFTCLIX LANCETS LANCETS    Use to check blood sugar once daily. Dx: E11.49   BLOOD GLUCOSE METER  KIT AND SUPPLIES    1 each by Other route as directed. Dispense based on patient and insurance preference. Use up to four times daily as directed. (FOR ICD-10 E10.9, E11.9).   CHOLECALCIFEROL 125 MCG (5000 UT) CAPSULE    Take 5,000 Units by mouth daily.   COLCHICINE 0.6 MG TABLET    Take 1 tablet (0.6 mg total) by mouth as needed. First sign of Gout Flare. Take 2 tablets ( 1.$RemoveBef'2mg'woPowlFlmt$ ) by mouth at first sign of gout flare followed by 1 tablet ( 0.$RemoveBe'6mg'bCfNSnEoB$ ) after 1 hour. ( max 1.$Remov'8mg'OJzSlz$  within 1 hours)   CYANOCOBALAMIN 1000 MCG TABLET    Take 1,000 mcg by mouth every other day.  FLUDROCORTISONE ACETATE (FLORINEF PO)    Take 0.1 mg by mouth every other day. On alternate days take 1/2 tablet.   FUROSEMIDE (LASIX) 20 MG TABLET    Take 20 mg by mouth as needed. For Edema.   GABAPENTIN (NEURONTIN) 600 MG TABLET    Take 900 mg by mouth daily. Takes 1 and 1/2 tablet by mouth nightly.   GLUCOSE BLOOD (ACCU-CHEK AVIVA PLUS) TEST STRIP    Use to test blood sugar once daily. Dx: E11.49   HYDROCORTISONE (CORTEF) 10 MG TABLET    Take by mouth daily. Takes 1 and 1/2 tablet every morning, 1/2 tablet every afternoon, and 1/2 tablet every evening. Take double dose as directed for stress, may take up to 85 tablets monthly.   LEVOTHYROXINE (SYNTHROID) 75 MCG TABLET    Take 75 mcg by mouth daily.   METFORMIN (GLUMETZA) 500 MG (MOD) 24 HR TABLET    Take 500 mg by mouth daily.   MULTIPLE VITAMINS-MINERALS (PRESERVISION AREDS 2 PO)    Take 1 capsule by mouth in the morning and at bedtime.   PSYLLIUM HUSK PO    Take 3.4 g by mouth every evening.  Modified Medications   No medications on file  Discontinued Medications   No medications on file    Physical Exam:  Vitals:   04/17/22 1307  BP: 126/74  Pulse: 74  Temp: 98.4 F (36.9 C)  SpO2: 96%  Weight: 194 lb (88 kg)  Height: $Remove'5\' 8"'HszXuMp$  (1.727 m)   Body mass index is 29.5 kg/m. Wt Readings from Last 3 Encounters:  04/17/22 194 lb (88 kg)  04/05/22 199 lb (90.3 kg)  10/10/21  206 lb (93.4 kg)    Physical Exam Constitutional:      General: He is not in acute distress.    Appearance: He is well-developed. He is not diaphoretic.  HENT:     Head: Normocephalic and atraumatic.     Right Ear: External ear normal.     Left Ear: External ear normal.     Mouth/Throat:     Pharynx: No oropharyngeal exudate.  Eyes:     Conjunctiva/sclera: Conjunctivae normal.     Pupils: Pupils are equal, round, and reactive to light.  Cardiovascular:     Rate and Rhythm: Normal rate and regular rhythm.     Heart sounds: Normal heart sounds.  Pulmonary:     Effort: Pulmonary effort is normal.     Breath sounds: Normal breath sounds.  Abdominal:     General: Bowel sounds are normal.     Palpations: Abdomen is soft.  Musculoskeletal:        General: No tenderness.     Cervical back: Normal range of motion and neck supple.     Right lower leg: No edema.     Left lower leg: No edema.  Skin:    General: Skin is warm and dry.  Neurological:     Mental Status: He is alert and oriented to person, place, and time.     Motor: Weakness present.     Gait: Gait abnormal (walks with walker and support of spouse).  Psychiatric:        Mood and Affect: Mood normal.     Labs reviewed: Basic Metabolic Panel: Recent Labs    08/14/21 0000  NA 139  K 4.8  CL 103  CO2 29*  BUN 18  CREATININE 1.1  CALCIUM 9.1  TSH 3.34   Liver Function Tests: No results for input(s): "  AST", "ALT", "ALKPHOS", "BILITOT", "PROT", "ALBUMIN" in the last 8760 hours. No results for input(s): "LIPASE", "AMYLASE" in the last 8760 hours. No results for input(s): "AMMONIA" in the last 8760 hours. CBC: No results for input(s): "WBC", "NEUTROABS", "HGB", "HCT", "MCV", "PLT" in the last 8760 hours. Lipid Panel: No results for input(s): "CHOL", "HDL", "LDLCALC", "TRIG", "CHOLHDL", "LDLDIRECT" in the last 8760 hours. TSH: Recent Labs    08/14/21 0000  TSH 3.34   A1C: Lab Results  Component Value Date    HGBA1C 7.2 08/14/2021     Assessment/Plan 1. 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 Gout, unspecified cause, unspecified chronicity, unspecified site -improved, wife had given him a lot of high purine foods with trying to incorporate healthier diet.   3. Adrenal insufficiency (HCC) Followed by endocrine, continues on florinef and cortef  4. Type 2 diabetes mellitus with neurological complications (HCC) -last A1c at goal. Encouraged dietary compliance, routine foot care/monitoring and to keep up with diabetic eye exams through ophthalmology  -blood sugars reviewed with pt and in appropriate range  5. OSA on CPAP Ongoing.  6. Postoperative hypothyroidism TSH at goal In April.   7. Benign prostatic hyperplasia with urinary frequency Will start Flomax to help with symptoms.  - tamsulosin (FLOMAX) 0.4 MG CAPS capsule; Take 1 capsule (0.4 mg total) by mouth daily after supper.  Dispense: 30 capsule; Refill: 3   Next appt: 3 months.  Carlos American. [place], [facility] [phone]

## 2022-04-20 ENCOUNTER — Ambulatory Visit: Payer: Medicare Other | Admitting: Nurse Practitioner

## 2022-04-20 ENCOUNTER — Ambulatory Visit: Payer: Medicare Other

## 2022-04-20 DIAGNOSIS — R269 Unspecified abnormalities of gait and mobility: Secondary | ICD-10-CM | POA: Diagnosis not present

## 2022-04-20 DIAGNOSIS — M6281 Muscle weakness (generalized): Secondary | ICD-10-CM

## 2022-04-20 DIAGNOSIS — R262 Difficulty in walking, not elsewhere classified: Secondary | ICD-10-CM

## 2022-04-20 DIAGNOSIS — R278 Other lack of coordination: Secondary | ICD-10-CM

## 2022-04-20 DIAGNOSIS — R2689 Other abnormalities of gait and mobility: Secondary | ICD-10-CM

## 2022-04-20 DIAGNOSIS — R296 Repeated falls: Secondary | ICD-10-CM

## 2022-04-20 DIAGNOSIS — R2681 Unsteadiness on feet: Secondary | ICD-10-CM

## 2022-04-20 NOTE — Therapy (Signed)
OUTPATIENT PHYSICAL THERAPY TREATMENT NOTE/PROGRESS NOTE  DATES OF REPORTING PERIOD: 01/29/22 - 04/20/22   Patient Name: Tony Huffman MRN: [id number] DOB:October 29, 1945, 76 y.o., male Today's Date: 04/20/2022  PCP: Sherrie Mustache, NP REFERRING PROVIDER: Dr. Jennings Books   PT End of Session - 04/20/22 0850     Visit Number 60    Number of Visits 47    Date for PT Re-Evaluation 06/22/22    Authorization Type Medicare Part A & B; BCBS Secondary    Authorization Time Period 01/05/2022-03/30/2022; 03/30/22-06/22/22    Progress Note Due on Visit 60    PT Start Time 0848    PT Stop Time 0930    PT Time Calculation (min) 42 min    Equipment Utilized During Treatment Gait belt    Activity Tolerance Patient tolerated treatment well;No increased pain    Behavior During Therapy [facility] for tasks assessed/performed             Past Medical History:  Diagnosis Date   Abnormal heart rhythm 10/20/2014   Addison's disease ([place])    Adrenal insufficiency ([place])    Allergy    Ambulates with cane    Anxiety    Arrhythmia    Cataract cortical, senile    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   Chronic kidney disease, stage 3 ([place]) 11/13/2011   Cough 03/29/2014   Edema leg 03/07/2017   Elevated cholesterol with high triglycerides 09/04/2013   Elevated red blood cell count    Erectile dysfunction    Falls frequently    Fever 03/29/2014   Generalized weakness 03/29/2014   GERD (gastroesophageal reflux disease)    Gout 09/04/2013   H/O adrenal insufficiency 05/17/2014   H/O atrial flutter 03/07/2017   H/O atrial flutter    H/O eye surgery    H/O sebaceous cyst 01/06/2018   H/O thyroid disease    H/O upper respiratory infection 10/15/2013   Hearing loss    History of back surgery    History of blepharoplasty    History of cancer 10/04/2015   History of chemotherapy    History of esophagogastroduodenoscopy (EGD)    History of prediabetes    Hyperlipidemia 06/16/2014   Hypertension     Hypertriglyceridemia    Hypothyroidism (acquired) 04/14/2014   Left leg swelling 11/23/2016   Low HDL (under 40)    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[place]) 11/10/2010   Malignant neoplasm of unspecified kidney, except renal pelvis (HCC)    OSA (obstructive sleep apnea) 07/24/2016   OSA (obstructive sleep apnea)    PAF (paroxysmal atrial fibrillation) (HCC)    PAF (paroxysmal atrial fibrillation) ([place])    Pancreatic mass 08/01/2018   Renal carcinoma ([place]) 06/16/2015   RLS (restless legs syndrome) 07/24/2016   Sleep apnea    Testicular hypofunction    Tubular adenoma 10/28/2017   Tussive syncopes 10/15/2013   Type 2 diabetes mellitus ([place])    Vitamin D deficiency disease    Past Surgical History:  Procedure Laterality Date   ADRENALECTOMY     CATARACT EXTRACTION  10/2014   CATARACT EXTRACTION EXTRACAPSULAR  11/05/2016   with Insertion Intraocular Prostheis.    COLONOSCOPY  05/06/2017   with removal lesions by snare.   COLONOSCOPY W/ BIOPSIES     05/06/2017   CYSTECTOMY     Upper Neck/Chest Area, [facility]   NEPHRECTOMY     POPLITEAL SYNOVIAL CYST EXCISION     SPINE SURGERY  1989   THYROIDECTOMY     THYROIDECTOMY, PARTIAL  TONSILLECTOMY     Patient Active Problem List   Diagnosis Date Noted   Addisons disease ([place]) 03/16/2021   Low HDL (under 40) 03/16/2021   Difficulty sleeping 11/21/2020   Numbness and tingling 11/21/2020   Difficulty walking 01/14/2020   History of recent fall 01/14/2020   Sleep disorder 01/14/2020   Pancreatic mass 08/01/2018   H/O sebaceous cyst 01/06/2018   Tubular adenoma 10/28/2017   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   Edema leg 03/07/2017   Hx of atrial flutter 03/07/2017   OSA (obstructive sleep apnea) 07/24/2016   RLS (restless legs syndrome) 07/24/2016   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   Atrial fibrillation and flutter ([place]) 10/20/2014   Hyperlipidemia 06/16/2014   Adrenal crisis ([place])  04/19/2014   Cough 03/29/2014   Weakness 03/29/2014   Cough syncope 10/15/2013   URI (upper respiratory infection) 10/15/2013   Dyslipidemia 09/04/2013   Gout 09/04/2013   Chronic kidney disease, stage III (moderate) ([place]) 11/13/2011   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   Hypothyroidism (acquired) 11/30/2010   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[place]) 11/10/2010    REFERRING DIAG: Unspecified abnormalities of gait and mobility  THERAPY DIAG:  Abnormality of gait and mobility  Difficulty in walking, not elsewhere classified  Muscle weakness (generalized)  Other abnormalities of gait and mobility  Other lack of coordination  Repeated falls  Unsteadiness on feet  Rationale for Evaluation and Treatment Rehabilitation  PERTINENT HISTORY: Patient is a 76 year old male with referral from Neurology- Dr. Joselyn Arrow for unspecified abnormality of gait. He presents today with his wife and reports > 6 month history of progressive issues with poor balance and multiple falls. Patient has renal Cancer and currently has Pallative care- Was stabilized out of Hospice. Patient presents with the following past medical history: A-Fib and flutter, Lung Cancer, Obstructive sleep apnea, Upper respiratory infection, Addison's disease, Hypothyroidism, Renal Cancer, DM, gout, arthritis, Chronic kidney disease and Restless leg syndrome. Patient lives with wife in independent living at [facility]. Prior level of function was independent with transfers and mobility using walker yet some assist with dressing.   PRECAUTIONS: falls  SUBJECTIVE: Pt denies falls since last visit. Has successfully navigating curbs at home utilizing correct LE sequencing learned from PT. Has had some dizziness and fatigue after being prescribed Flomax yesterday.   PAIN:  Are you having pain? No     TODAY'S TREATMENT:    04/20/22: There.ex:   Orthostatic screening Vitals seated prior to PT:   BP: 115/65  HR: 72  BPM  Standing:   BP: 108/69 mm Hg  HR: 75 BPM  Standing 2 minutes:  BP: 106/74 mm Hg  HR: 75 BPM   Reassessment of 10 meter gait speed and 6MWT. Decreased 10 meter gait speed noted with SPC compared to previous performance. Pt's 6MWT globally in status quo. Able to complete full 6 minutes which is improvement from previous testing however distance is still decreased from initial eval test. Pt requiring frequent multimodal cuing to stop gait and reset due to increased shuffling, increasing velocity of shortened steps and progressive anterior trunk leaning.   Seated postural strengthening:   Blue TB scap retractions with back unsupported from seat: 3x10 with VC's for upright posture and preventing cervical flexion and sacral sitting   Seated B shoulder horizontal abduction: 3x10 with VC's to maintain shoulders flexed at 90 degrees  Education provided on increased falls risk with common side effects of medication (Flomax) leading to dizziness and  fatigue. Pt very much so appearing at increased risk of falls compared to baseline today due to significant shuffling of gait and dragging LLE with anterior truncal lean compared to pt's baseline. Education to monitor symptoms and increase awareness when ambulating. Education provided to contact provider if symptoms persist. Pt and spouse verbalizing understanding.    PATIENT EDUCATION: Education details: exercise technique. Risk for falls currently with possible medication side effects. Educated to contact providing physician if symptoms persist  Person educated: Patient Education method: Explanation, Demonstration, Tactile cues, and Verbal cues Education comprehension: verbalized understanding, returned demonstration, verbal cues required, tactile cues required, and needs further education   HOME EXERCISE PROGRAM: No updates in today's session   PT Short Term Goals -       PT SHORT TERM GOAL #1   Title Pt will be independent with HEP in  order to improve strength and balance in order to decrease fall risk and improve function at home and work.    Baseline 07/21/2021- Paitent presents with no formal HEP in place. 08/21/2021- Patient reports compliance with HEP and states no questions as of today- able to perform standin using his rollator for support.    Time 6    Period Weeks    Status Achieved    Target Date 09/01/21              PT Long Term Goals -      PT LONG TERM GOAL #1   Title Pt will improve FOTO to target score of 53  to display perceived improvements in ability to complete ADL's.    Baseline 07/21/2021= 45; 1/5=51. 10/12/2021- Just reassessed for progress note so did not reassess again- will continue with goal into new certification 5/88: 50%    Time 12    Period Weeks    Status Achieved    Target Date 03/30/22      PT LONG TERM GOAL #2   Title Pt will improve BERG by at least 5 points in order to demonstrate clinically significant improvement in balance.    Baseline 07/21/2021= 39/56; 08/21/2021=46/56- Will keep goal active to ensure patient able to test consistently; 10/05/2021= 52/56    Time 12    Period Weeks    Status Achieved    Target Date 10/13/21      PT LONG TERM GOAL #3   Title Pt will decrease 5TSTS by at least 5 seconds in order to demonstrate clinically significant improvement in LE strength.    Baseline 07/21/2021= 21 sec without UE Support; 11/21= 14.0 sec without UE support-.Will Keep goal active to ensure consistency.  10/05/2021=14.75 sec without UE support  (improved from 21 sec and unchanged from last assessment of 14 sec)    Time 12    Period Weeks    Status Achieved    Target Date 10/13/21      PT LONG TERM GOAL #4   Title Pt will decrease TUG to below  18 seconds/decrease in order to demonstrate decreased fall risk.    Baseline 07/21/2021= 23 sec with 4WW; 08/21/2021= 22.13 sec with 4WW; 10/05/2021= 19.51 sec avg with 4WW 2/10: 15.4 seconds with cane  12/14/2021= 18.0 sec    Time  12    Period Weeks    Status Achieved    Target Date 01/05/22      PT LONG TERM GOAL #5   Title Pt will increase 10MWT by at least 0.23 m/s in order to demonstrate clinically significant improvement in community ambulation.  Baseline 07/21/2021= 0.20ms; 08/21/2021= 0.57 m/s using 4WW; 10/05/2021= 0.6 m/s avg using 4WW 2/10: 0.51 m/s with cane. 12/14/2021= 0.65 m/s using SPC ; 04/20/22: .599 m/s SPC   Time 12    Period Weeks    Status Partially Met    Target Date 03/30/22      PT LONG TERM GOAL #6   Title Patient will increase six minute walk test distance to > 100 for progression to community ambulator and improve gait ability    Baseline 01/05/2022= 495 feet using SPC; 01/29/2022= 470 feet in 4 min 30 sec using SPC ; 04/20/2022: 478'   Time 12    Period Weeks    Status On-going    Target Date 03/30/22              Plan - 03/23/22 0855     Clinical Impression Statement Pt at 60th visit warranting progress note. Pt overall fatigued from lack of sleep and experiencing dizziness yesterday. Pt overall ISQ with 6MWT but decreased gait velocity with SPC today. Possible regression in gait speed due to fatigue today but pt was able to complete full 6 minutes for 6MWT which pt was unable when last assessed. Although completing 6 minutes total, pt ambulating shorter distance that pt was able to complete on last assessment in 4 minutes total. Pt may warrant more accurate reassessment next visit due to fatigue today. Orthostatics screened today which were negative. Pt at increased risk of falls due to worsening of shuffling gait, LLE dragging and anterior trunk lean today requiring frequent stopping of gait and cuing for [facility] and upright posture with limited carryover. Education provided on increased falls risk from baseline and contacting provider if symptoms persist as pt symptoms could be related to side effects from newly prescribed medication (Flomax). Patient's condition has the potential  to improve in response to therapy. Maximum improvement is yet to be obtained. The anticipated improvement is attainable and reasonable in a generally predictable time. Will continue current PT POC as able to reduce risk of falls.      Personal Factors and Comorbidities Comorbidity 3+    Comorbidities A- Fib, Renal cancer, Addison's disease, gout, arthritis, DM    Examination-Activity Limitations Caring for Others;Carry;Dressing;Lift;Reach Overhead;Squat;Stairs;Transfers;Stand    Examination-Participation Restrictions CInvestment banker, operationalYard Work;Shop    Stability/Clinical Decision Making Evolving/Moderate complexity    Rehab Potential Good    PT Frequency 1x / week    PT Duration 12 weeks    PT Treatment/Interventions ADLs/Self Care Home Management;Canalith Repostioning;Cryotherapy;Electrical Stimulation;Moist Heat;Traction;Ultrasound;DME Instruction;Gait training;Stair training;Functional mobility training;Therapeutic activities;Therapeutic exercise;Balance training;Neuromuscular re-education;Patient/family education;Orthotic Fit/Training;Wheelchair mobility training;Manual techniques;Passive range of motion;Dry needling;Energy conservation;Taping;Vestibular    PT Next Visit Plan Reassess gait speed ans 6MWT if symptoms improved. Please incorporate postural strengthening due to anterior trunk lean with gait.    PT Home Exercise Plan 08/17/2021-Access Code: [email or web address]; no updates    Consulted and Agree with Plan of Care Patient;Family member/caregiver    Family Member Consulted WIfe- MCarlean Jews Fairly IV, PT, DPT Physical Therapist- [facility] 04/20/2022, 9:54 AM

## 2022-04-24 ENCOUNTER — Encounter: Payer: Self-pay | Admitting: Urology

## 2022-04-24 ENCOUNTER — Other Ambulatory Visit
Admission: RE | Admit: 2022-04-24 | Discharge: 2022-04-24 | Disposition: A | Discharge status: Home / Self Care | Payer: Medicare Other | Attending: Urology | Admitting: Urology

## 2022-04-24 ENCOUNTER — Other Ambulatory Visit: Payer: Self-pay | Admitting: *Deleted

## 2022-04-24 ENCOUNTER — Ambulatory Visit (INDEPENDENT_AMBULATORY_CARE_PROVIDER_SITE_OTHER): Payer: Medicare Other | Admitting: Urology

## 2022-04-24 VITALS — BP 122/78 | HR 74 | Ht 68.0 in | Wt 201.0 lb

## 2022-04-24 DIAGNOSIS — C649 Malignant neoplasm of unspecified kidney, except renal pelvis: Secondary | ICD-10-CM

## 2022-04-24 DIAGNOSIS — R399 Unspecified symptoms and signs involving the genitourinary system: Secondary | ICD-10-CM

## 2022-04-24 DIAGNOSIS — R300 Dysuria: Secondary | ICD-10-CM | POA: Diagnosis present

## 2022-04-24 DIAGNOSIS — N3941 Urge incontinence: Secondary | ICD-10-CM | POA: Diagnosis not present

## 2022-04-24 DIAGNOSIS — K8689 Other specified diseases of pancreas: Secondary | ICD-10-CM

## 2022-04-24 DIAGNOSIS — N401 Enlarged prostate with lower urinary tract symptoms: Secondary | ICD-10-CM | POA: Diagnosis not present

## 2022-04-24 DIAGNOSIS — R350 Frequency of micturition: Secondary | ICD-10-CM | POA: Diagnosis not present

## 2022-04-24 DIAGNOSIS — Z85528 Personal history of other malignant neoplasm of kidney: Secondary | ICD-10-CM

## 2022-04-24 LAB — URINALYSIS, COMPLETE (UACMP) WITH MICROSCOPIC
Bacteria, UA: NONE SEEN
Bilirubin Urine: NEGATIVE
Glucose, UA: NEGATIVE mg/dL
Hgb urine dipstick: NEGATIVE
Ketones, ur: NEGATIVE mg/dL
Leukocytes,Ua: NEGATIVE
Nitrite: NEGATIVE
Protein, ur: NEGATIVE mg/dL
RBC / HPF: NONE SEEN RBC/hpf (ref 0–5)
Specific Gravity, Urine: 1.015 (ref 1.005–1.030)
Squamous Epithelial / HPF: NONE SEEN (ref 0–5)
WBC, UA: NONE SEEN WBC/hpf (ref 0–5)
pH: 6 (ref 5.0–8.0)

## 2022-04-24 LAB — BLADDER SCAN AMB NON-IMAGING

## 2022-04-24 MED ORDER — MIRABEGRON ER 50 MG PO TB24: 50.0000 mg | ORAL_TABLET | Freq: Every day | ORAL | 0 refills | Status: DC

## 2022-04-24 NOTE — Progress Notes (Signed)
04/24/22 10:06 AM   Tony Huffman October 15, 1945 [id number]  CC: Lower urinary tract symptoms, history of metastatic kidney cancer  HPI: 76 year old male with complex urologic history.  From extensive chart review and conversation with the patient and his wife, he was diagnosed with stage IV kidney cancer with a large 9 cm left renal mass and left adrenal mass and underwent left nephrectomy and adrenalectomy in 2011 in [place].  He was followed by hematology and oncology in [place] at [facility], and developed a recurrence in the right adrenal gland in 2011 which ultimately underwent surgical resection, and was also treated with pazopanib.  His care was then transferred to [facility] and felt to have recurrence in the lungs based on the PET scan, as well as in the pancreas with an enlarging pancreatic mass.  It sounds like he actually moved to hospice care, but continued to feel well and never had any symptoms or weight loss.  He lost faith in healthcare system after all of this, and has not been seen by any medical provider since 2020.  The most recent imaging was from April 2020 that showed an enhancing and enlarging mass at the pancreatic head measuring 3 cm, normal right kidney, adrenal gland surgically absent bilaterally, mildly enlarged heterogenous prostate, and slight increase in bilateral pulmonary nodules.  He is not interested in further work-up at this time.  He presents today for urinary symptoms that have been going on about 1 month.  Primary complaints are urinary urgency and frequency during the day with occasional urge incontinence, as well as nocturia 0-2 times overnight depending on what he drinks.  He has sleep apnea and is compliant with CPAP machine.  He denies any dysuria or gross hematuria.  He drinks decaf coffee in the morning, water and green tea during the day, and sometimes an NA Guinness in the evening.  He was prescribed Flomax by PCP, but was concerned about possible side effects, and felt  lightheaded when he took it 1 time and opted to see urology for further options.  Urinalysis today completely benign, PVR normal at 16 mL.     PMH: Past Medical History:  Diagnosis Date   Abnormal heart rhythm 10/20/2014   Addison's disease ([place])    Adrenal insufficiency (HCC)    Allergy    Ambulates with cane    Anxiety    Arrhythmia    Cataract cortical, senile    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   Chronic kidney disease, stage 3 ([place]) 11/13/2011   Cough 03/29/2014   Edema leg 03/07/2017   Elevated cholesterol with high triglycerides 09/04/2013   Elevated red blood cell count    Erectile dysfunction    Falls frequently    Fever 03/29/2014   Generalized weakness 03/29/2014   GERD (gastroesophageal reflux disease)    Gout 09/04/2013   H/O adrenal insufficiency 05/17/2014   H/O atrial flutter 03/07/2017   H/O atrial flutter    H/O eye surgery    H/O sebaceous cyst 01/06/2018   H/O thyroid disease    H/O upper respiratory infection 10/15/2013   Hearing loss    History of back surgery    History of blepharoplasty    History of cancer 10/04/2015   History of chemotherapy    History of esophagogastroduodenoscopy (EGD)    History of prediabetes    Hyperlipidemia 06/16/2014   Hypertension    Hypertriglyceridemia    Hypothyroidism (acquired) 04/14/2014   Left leg swelling 11/23/2016   Low HDL (under 40)  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[place]) 11/10/2010   Malignant neoplasm of unspecified kidney, except renal pelvis (HCC)    OSA (obstructive sleep apnea) 07/24/2016   OSA (obstructive sleep apnea)    PAF (paroxysmal atrial fibrillation) (HCC)    PAF (paroxysmal atrial fibrillation) ([place])    Pancreatic mass 08/01/2018   Renal carcinoma ([place]) 06/16/2015   RLS (restless legs syndrome) 07/24/2016   Sleep apnea    Testicular hypofunction    Tubular adenoma 10/28/2017   Tussive syncopes 10/15/2013   Type 2 diabetes mellitus ([place])    Vitamin D deficiency disease      Surgical History: Past Surgical History:  Procedure Laterality Date   ADRENALECTOMY     CATARACT EXTRACTION  10/2014   CATARACT EXTRACTION EXTRACAPSULAR  11/05/2016   with Insertion Intraocular Prostheis.    COLONOSCOPY  05/06/2017   with removal lesions by snare.   COLONOSCOPY W/ BIOPSIES     05/06/2017   CYSTECTOMY     Upper Neck/Chest Area, [facility]   NEPHRECTOMY     POPLITEAL SYNOVIAL CYST EXCISION     SPINE SURGERY  1989   THYROIDECTOMY     THYROIDECTOMY, PARTIAL     TONSILLECTOMY       Family History: Family History  Problem Relation Age of Onset   Ovarian cancer Mother    Cancer Mother    Cancer Father    Heart disease Father    Coronary artery disease Father    Hypertension Father    Macular degeneration Father    Heart disease Brother    Diabetes Brother    Coronary artery disease Brother    Diabetes type II Brother    Coronary artery disease Paternal Grandfather     Social History:  reports that he quit smoking about 45 years ago. His smoking use included cigarettes. He has a 10.00 pack-year smoking history. He has never used smokeless tobacco. He reports that he does not currently use alcohol. He reports that he does not use drugs.  Physical Exam: BP 122/78 (BP Location: Left Arm, Patient Position: Sitting, Cuff Size: Large)   Pulse 74   Ht $R'5\' 8"'Tf$  (1.727 m)   Wt 201 lb (91.2 kg)   BMI 30.56 kg/m    Constitutional:  Alert and oriented, No acute distress. Cardiovascular: No clubbing, cyanosis, or edema. Respiratory: Normal respiratory effort, no increased work of breathing. GI: Abdomen is soft, nontender, nondistended, no abdominal masses GU: Phallus with patent meatus, no lesions  Laboratory Data: Reviewed UA benign Hemoglobin A1c 7.2 Normal renal function creatinine 1.1, EGFR greater than 60  Assessment & Plan:   76 year old male with history of stage IV metastatic kidney cancer originally diagnosed in 2011, status post left  radical nephrectomy and adrenalectomy, follow-up right adrenalectomy for metastatic disease, previously on immunotherapy, and last imaged in 2020 with enlarging pancreatic mass and small pulmonary nodules were enlarging and concerning for worsening metastatic disease.  He was transition to hospice for 2 years, but remained asymptomatic, had no weight loss and continued to feel well.  He did not follow-up with urology or oncology after that.  I had a frank conversation with the patient today about limitations of diagnosis by deferring further imaging or work-up with his history of metastatic disease and pancreatic mass.  He has undergone numerous biopsies and imaging test before, and defers further work-up.  Risk and benefits discussed extensively, and I think he has a good understanding of his medical conditions.  Regarding the urinary symptoms,  we discussed overlap between BPH and OAB, as well as effect of aging, diabetes, fluid intake on urinary symptoms.    We focused on behavioral strategies, and he is also interested in a trial of Myrbetriq 50 mg daily.  Risk and benefits discussed.  Trial of Myrbetriq 50 mg daily RTC 1 month symptom check and PVR Consider re- discussing cross-sectional imaging if worsening urinary symptoms in the future  I spent 60 total minutes on the day of the encounter including pre-visit review of the medical record, face-to-face time with the patient, and post visit ordering of labs/imaging/tests.  Nickolas Madrid, MD 04/24/2022  [facility] [address] [place], [place] [address] [phone]

## 2022-04-24 NOTE — Patient Instructions (Signed)

## 2022-04-27 ENCOUNTER — Ambulatory Visit: Payer: Medicare Other

## 2022-04-27 DIAGNOSIS — R2681 Unsteadiness on feet: Secondary | ICD-10-CM

## 2022-04-27 DIAGNOSIS — M6281 Muscle weakness (generalized): Secondary | ICD-10-CM

## 2022-04-27 DIAGNOSIS — R269 Unspecified abnormalities of gait and mobility: Secondary | ICD-10-CM | POA: Diagnosis not present

## 2022-04-27 DIAGNOSIS — R278 Other lack of coordination: Secondary | ICD-10-CM

## 2022-04-27 DIAGNOSIS — R2689 Other abnormalities of gait and mobility: Secondary | ICD-10-CM

## 2022-04-27 DIAGNOSIS — R296 Repeated falls: Secondary | ICD-10-CM

## 2022-04-27 DIAGNOSIS — R262 Difficulty in walking, not elsewhere classified: Secondary | ICD-10-CM

## 2022-04-27 NOTE — Therapy (Signed)
OUTPATIENT PHYSICAL THERAPY TREATMENT NOTE  Patient Name: Tony Huffman MRN: [id number] DOB:11-Oct-1945, 76 y.o., male Today's Date: 04/27/2022  PCP: Sherrie Mustache, NP REFERRING PROVIDER: Dr. Jennings Books   PT End of Session - 04/27/22 0848     Visit Number 18    Number of Visits 61    Date for PT Re-Evaluation 06/22/22    Authorization Type Medicare Part A & B; BCBS Secondary    Authorization Time Period 01/05/2022-03/30/2022; 03/30/22-06/22/22    Progress Note Due on Visit 60    PT Start Time 0849    PT Stop Time 0929    PT Time Calculation (min) 40 min    Equipment Utilized During Treatment Gait belt    Activity Tolerance Patient tolerated treatment well;No increased pain    Behavior During Therapy [facility] for tasks assessed/performed             Past Medical History:  Diagnosis Date   Abnormal heart rhythm 10/20/2014   Addison's disease ([place])    Adrenal insufficiency ([place])    Allergy    Ambulates with cane    Anxiety    Arrhythmia    Cataract cortical, senile    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   Chronic kidney disease, stage 3 ([place]) 11/13/2011   Cough 03/29/2014   Edema leg 03/07/2017   Elevated cholesterol with high triglycerides 09/04/2013   Elevated red blood cell count    Erectile dysfunction    Falls frequently    Fever 03/29/2014   Generalized weakness 03/29/2014   GERD (gastroesophageal reflux disease)    Gout 09/04/2013   H/O adrenal insufficiency 05/17/2014   H/O atrial flutter 03/07/2017   H/O atrial flutter    H/O eye surgery    H/O sebaceous cyst 01/06/2018   H/O thyroid disease    H/O upper respiratory infection 10/15/2013   Hearing loss    History of back surgery    History of blepharoplasty    History of cancer 10/04/2015   History of chemotherapy    History of esophagogastroduodenoscopy (EGD)    History of prediabetes    Hyperlipidemia 06/16/2014   Hypertension    Hypertriglyceridemia    Hypothyroidism (acquired) 04/14/2014   Left leg  swelling 11/23/2016   Low HDL (under 40)    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[place]) 11/10/2010   Malignant neoplasm of unspecified kidney, except renal pelvis (HCC)    OSA (obstructive sleep apnea) 07/24/2016   OSA (obstructive sleep apnea)    PAF (paroxysmal atrial fibrillation) (HCC)    PAF (paroxysmal atrial fibrillation) ([place])    Pancreatic mass 08/01/2018   Renal carcinoma ([place]) 06/16/2015   RLS (restless legs syndrome) 07/24/2016   Sleep apnea    Testicular hypofunction    Tubular adenoma 10/28/2017   Tussive syncopes 10/15/2013   Type 2 diabetes mellitus ([place])    Vitamin D deficiency disease    Past Surgical History:  Procedure Laterality Date   ADRENALECTOMY     CATARACT EXTRACTION  10/2014   CATARACT EXTRACTION EXTRACAPSULAR  11/05/2016   with Insertion Intraocular Prostheis.    COLONOSCOPY  05/06/2017   with removal lesions by snare.   COLONOSCOPY W/ BIOPSIES     05/06/2017   CYSTECTOMY     Upper Neck/Chest Area, [facility]   NEPHRECTOMY     POPLITEAL SYNOVIAL CYST EXCISION     SPINE SURGERY  1989   THYROIDECTOMY     THYROIDECTOMY, PARTIAL     TONSILLECTOMY     Patient Active  Problem List   Diagnosis Date Noted   Addisons disease ([place]) 03/16/2021   Low HDL (under 40) 03/16/2021   Difficulty sleeping 11/21/2020   Numbness and tingling 11/21/2020   Difficulty walking 01/14/2020   History of recent fall 01/14/2020   Sleep disorder 01/14/2020   Pancreatic mass 08/01/2018   H/O sebaceous cyst 01/06/2018   Tubular adenoma 10/28/2017   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   Edema leg 03/07/2017   Hx of atrial flutter 03/07/2017   OSA (obstructive sleep apnea) 07/24/2016   RLS (restless legs syndrome) 07/24/2016   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   Atrial fibrillation and flutter ([place]) 10/20/2014   Hyperlipidemia 06/16/2014   Adrenal crisis ([place]) 04/19/2014   Cough 03/29/2014   Weakness 03/29/2014   Cough syncope  10/15/2013   URI (upper respiratory infection) 10/15/2013   Dyslipidemia 09/04/2013   Gout 09/04/2013   Chronic kidney disease, stage III (moderate) ([place]) 11/13/2011   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   Hypothyroidism (acquired) 11/30/2010   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[place]) 11/10/2010    REFERRING DIAG: Unspecified abnormalities of gait and mobility  THERAPY DIAG:  Abnormality of gait and mobility  Difficulty in walking, not elsewhere classified  Muscle weakness (generalized)  Other abnormalities of gait and mobility  Other lack of coordination  Repeated falls  Unsteadiness on feet  Rationale for Evaluation and Treatment Rehabilitation  PERTINENT HISTORY: Patient is a 76 year old male with referral from Neurology- Dr. Joselyn Arrow for unspecified abnormality of gait. He presents today with his wife and reports > 6 month history of progressive issues with poor balance and multiple falls. Patient has renal Cancer and currently has Pallative care- Was stabilized out of Hospice. Patient presents with the following past medical history: A-Fib and flutter, Lung Cancer, Obstructive sleep apnea, Upper respiratory infection, Addison's disease, Hypothyroidism, Renal Cancer, DM, gout, arthritis, Chronic kidney disease and Restless leg syndrome. Patient lives with wife in independent living at [facility]. Prior level of function was independent with transfers and mobility using walker yet some assist with dressing.   PRECAUTIONS: falls  SUBJECTIVE: Pt denies falls since last visit. Patient reports feeling pretty good overall- states he has had a good week. PAIN:  Are you having pain? No     TODAY'S TREATMENT:    04/27/22: THEREX:    6 min walk: approx 500 feet using spc, close CGA- 4 stops to regroup as he ambulated too fast- leaning forward and at times dragging left LE 3 way SLR (supine, sidelye, prone) x 15 reps each LE with 3lb AW Seated LAQ 3lb AW 2 sets of 15  reps Seated Ham curl with GTB 2 sets of 15 reps Seated calf raises x 12 reps Patient fatigued at end of session yet denied any pain     PATIENT EDUCATION: Education details: exercise technique. Person educated: Patient Education method: Explanation, Demonstration, Tactile cues, and Verbal cues Education comprehension: verbalized understanding, returned demonstration, verbal cues required, tactile cues required, and needs further education   HOME EXERCISE PROGRAM: No updates in today's session   PT Short Term Goals -       PT SHORT TERM GOAL #1   Title Pt will be independent with HEP in order to improve strength and balance in order to decrease fall risk and improve function at home and work.    Baseline 07/21/2021- Paitent presents with no formal HEP in place. 08/21/2021- Patient reports compliance with HEP and states no questions as of  today- able to perform standin using his rollator for support.    Time 6    Period Weeks    Status Achieved    Target Date 09/01/21              PT Long Term Goals -      PT LONG TERM GOAL #1   Title Pt will improve FOTO to target score of 53  to display perceived improvements in ability to complete ADL's.    Baseline 07/21/2021= 45; 1/5=51. 10/12/2021- Just reassessed for progress note so did not reassess again- will continue with goal into new certification 3/82: 50%    Time 12    Period Weeks    Status Achieved    Target Date 03/30/22      PT LONG TERM GOAL #2   Title Pt will improve BERG by at least 5 points in order to demonstrate clinically significant improvement in balance.    Baseline 07/21/2021= 39/56; 08/21/2021=46/56- Will keep goal active to ensure patient able to test consistently; 10/05/2021= 52/56    Time 12    Period Weeks    Status Achieved    Target Date 10/13/21      PT LONG TERM GOAL #3   Title Pt will decrease 5TSTS by at least 5 seconds in order to demonstrate clinically significant improvement in LE strength.     Baseline 07/21/2021= 21 sec without UE Support; 11/21= 14.0 sec without UE support-.Will Keep goal active to ensure consistency.  10/05/2021=14.75 sec without UE support  (improved from 21 sec and unchanged from last assessment of 14 sec)    Time 12    Period Weeks    Status Achieved    Target Date 10/13/21      PT LONG TERM GOAL #4   Title Pt will decrease TUG to below  18 seconds/decrease in order to demonstrate decreased fall risk.    Baseline 07/21/2021= 23 sec with 4WW; 08/21/2021= 22.13 sec with 4WW; 10/05/2021= 19.51 sec avg with 4WW 2/10: 15.4 seconds with cane  12/14/2021= 18.0 sec    Time 12    Period Weeks    Status Achieved    Target Date 01/05/22      PT LONG TERM GOAL #5   Title Pt will increase 10MWT by at least 0.23 m/s in order to demonstrate clinically significant improvement in community ambulation.    Baseline 07/21/2021= 0.16ms; 08/21/2021= 0.57 m/s using 4WW; 10/05/2021= 0.6 m/s avg using 4WW 2/10: 0.51 m/s with cane. 12/14/2021= 0.65 m/s using SPC ; 04/20/22: .599 m/s SPC   Time 12    Period Weeks    Status Partially Met    Target Date 03/30/22      PT LONG TERM GOAL #6   Title Patient will increase six minute walk test distance to > 100 for progression to community ambulator and improve gait ability    Baseline 01/05/2022= 495 feet using SPC; 01/29/2022= 470 feet in 4 min 30 sec using SPC ; 04/20/2022: 478'   Time 12    Period Weeks    Status On-going    Target Date 03/30/22              Plan - 03/23/22 0855     Clinical Impression Statement Patient presented with improved functional mobility vs. Previous visit yet still at increased fall risk- required VC to stop and re-group due to left LE dragging which patient does not always seem to recognize. Treatment focused on B LE strengthening with  some obvious increased fatigue/weakness in left LE vs. Right. Patient will benefit from continued PT services to focus on LE strength and safety with mobility to improve  overall mobility, decrease fall risk, and improve quality of life.     Personal Factors and Comorbidities Comorbidity 3+    Comorbidities A- Fib, Renal cancer, Addison's disease, gout, arthritis, DM    Examination-Activity Limitations Caring for Others;Carry;Dressing;Lift;Reach Overhead;Squat;Stairs;Transfers;Stand    Examination-Participation Restrictions Investment banker, operational;Yard Work;Shop    Stability/Clinical Decision Making Evolving/Moderate complexity    Rehab Potential Good    PT Frequency 1x / week    PT Duration 12 weeks    PT Treatment/Interventions ADLs/Self Care Home Management;Canalith Repostioning;Cryotherapy;Electrical Stimulation;Moist Heat;Traction;Ultrasound;DME Instruction;Gait training;Stair training;Functional mobility training;Therapeutic activities;Therapeutic exercise;Balance training;Neuromuscular re-education;Patient/family education;Orthotic Fit/Training;Wheelchair mobility training;Manual techniques;Passive range of motion;Dry needling;Energy conservation;Taping;Vestibular    PT Next Visit Plan Reassess gait speed ans 6MWT if symptoms improved. Please incorporate postural strengthening due to anterior trunk lean with gait.    PT Home Exercise Plan 08/17/2021-Access Code: [email or web address]; no updates    Consulted and Agree with Plan of Care Patient;Family member/caregiver    Family Member Consulted WIfe- [name]              Ollen Bowl, PT Physical Therapist- [facility]  04/27/2022, 3:52 PM

## 2022-05-04 ENCOUNTER — Ambulatory Visit: Payer: Medicare Other | Attending: Neurology

## 2022-05-04 DIAGNOSIS — R278 Other lack of coordination: Secondary | ICD-10-CM | POA: Diagnosis present

## 2022-05-04 DIAGNOSIS — M6281 Muscle weakness (generalized): Secondary | ICD-10-CM | POA: Insufficient documentation

## 2022-05-04 DIAGNOSIS — R262 Difficulty in walking, not elsewhere classified: Secondary | ICD-10-CM | POA: Diagnosis present

## 2022-05-04 DIAGNOSIS — R269 Unspecified abnormalities of gait and mobility: Secondary | ICD-10-CM | POA: Diagnosis present

## 2022-05-04 DIAGNOSIS — R296 Repeated falls: Secondary | ICD-10-CM | POA: Diagnosis present

## 2022-05-04 DIAGNOSIS — R2689 Other abnormalities of gait and mobility: Secondary | ICD-10-CM | POA: Insufficient documentation

## 2022-05-04 DIAGNOSIS — R2681 Unsteadiness on feet: Secondary | ICD-10-CM | POA: Diagnosis present

## 2022-05-04 NOTE — Therapy (Signed)
OUTPATIENT PHYSICAL THERAPY TREATMENT NOTE  Patient Name: Tony Huffman MRN: [id number] DOB:October 31, 1945, 76 y.o., male Today's Date: 05/04/2022  PCP: Sherrie Mustache, NP REFERRING PROVIDER: Dr. Jennings Books   PT End of Session - 05/04/22 0853     Visit Number 48    Number of Visits 75    Date for PT Re-Evaluation 06/22/22    Authorization Type Medicare Part A & B; BCBS Secondary    Authorization Time Period 01/05/2022-03/30/2022; 03/30/22-06/22/22    Progress Note Due on Visit 60    PT Start Time 0847    PT Stop Time 0929    PT Time Calculation (min) 42 min    Equipment Utilized During Treatment Gait belt    Activity Tolerance Patient tolerated treatment well;No increased pain    Behavior During Therapy [facility] for tasks assessed/performed             Past Medical History:  Diagnosis Date   Abnormal heart rhythm 10/20/2014   Addison's disease ([place])    Adrenal insufficiency ([place])    Allergy    Ambulates with cane    Anxiety    Arrhythmia    Cataract cortical, senile    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   Chronic kidney disease, stage 3 ([place]) 11/13/2011   Cough 03/29/2014   Edema leg 03/07/2017   Elevated cholesterol with high triglycerides 09/04/2013   Elevated red blood cell count    Erectile dysfunction    Falls frequently    Fever 03/29/2014   Generalized weakness 03/29/2014   GERD (gastroesophageal reflux disease)    Gout 09/04/2013   H/O adrenal insufficiency 05/17/2014   H/O atrial flutter 03/07/2017   H/O atrial flutter    H/O eye surgery    H/O sebaceous cyst 01/06/2018   H/O thyroid disease    H/O upper respiratory infection 10/15/2013   Hearing loss    History of back surgery    History of blepharoplasty    History of cancer 10/04/2015   History of chemotherapy    History of esophagogastroduodenoscopy (EGD)    History of prediabetes    Hyperlipidemia 06/16/2014   Hypertension    Hypertriglyceridemia    Hypothyroidism (acquired) 04/14/2014   Left leg  swelling 11/23/2016   Low HDL (under 40)    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[place]) 11/10/2010   Malignant neoplasm of unspecified kidney, except renal pelvis (HCC)    OSA (obstructive sleep apnea) 07/24/2016   OSA (obstructive sleep apnea)    PAF (paroxysmal atrial fibrillation) (HCC)    PAF (paroxysmal atrial fibrillation) ([place])    Pancreatic mass 08/01/2018   Renal carcinoma ([place]) 06/16/2015   RLS (restless legs syndrome) 07/24/2016   Sleep apnea    Testicular hypofunction    Tubular adenoma 10/28/2017   Tussive syncopes 10/15/2013   Type 2 diabetes mellitus ([place])    Vitamin D deficiency disease    Past Surgical History:  Procedure Laterality Date   ADRENALECTOMY     CATARACT EXTRACTION  10/2014   CATARACT EXTRACTION EXTRACAPSULAR  11/05/2016   with Insertion Intraocular Prostheis.    COLONOSCOPY  05/06/2017   with removal lesions by snare.   COLONOSCOPY W/ BIOPSIES     05/06/2017   CYSTECTOMY     Upper Neck/Chest Area, [facility]   NEPHRECTOMY     POPLITEAL SYNOVIAL CYST EXCISION     SPINE SURGERY  1989   THYROIDECTOMY     THYROIDECTOMY, PARTIAL     TONSILLECTOMY     Patient Active  Problem List   Diagnosis Date Noted   Addisons disease ([place]) 03/16/2021   Low HDL (under 40) 03/16/2021   Difficulty sleeping 11/21/2020   Numbness and tingling 11/21/2020   Difficulty walking 01/14/2020   History of recent fall 01/14/2020   Sleep disorder 01/14/2020   Pancreatic mass 08/01/2018   H/O sebaceous cyst 01/06/2018   Tubular adenoma 10/28/2017   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   Edema leg 03/07/2017   Hx of atrial flutter 03/07/2017   OSA (obstructive sleep apnea) 07/24/2016   RLS (restless legs syndrome) 07/24/2016   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   Atrial fibrillation and flutter ([place]) 10/20/2014   Hyperlipidemia 06/16/2014   Adrenal crisis ([place]) 04/19/2014   Cough 03/29/2014   Weakness 03/29/2014   Cough syncope  10/15/2013   URI (upper respiratory infection) 10/15/2013   Dyslipidemia 09/04/2013   Gout 09/04/2013   Chronic kidney disease, stage III (moderate) ([place]) 11/13/2011   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   Hypothyroidism (acquired) 11/30/2010   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 Joselyn Arrow for unspecified abnormality of gait. He presents today with his wife and reports > 6 month history of progressive issues with poor balance and multiple falls. Patient has renal Cancer and currently has Pallative care- Was stabilized out of Hospice. Patient presents with the following past medical history: A-Fib and flutter, Lung Cancer, Obstructive sleep apnea, Upper respiratory infection, Addison's disease, Hypothyroidism, Renal Cancer, DM, gout, arthritis, Chronic kidney disease and Restless leg syndrome. Patient lives with wife in independent living at [facility]. Prior level of function was independent with transfers and mobility using walker yet some assist with dressing.   PRECAUTIONS: falls  SUBJECTIVE: Patient reports feeling a little sluggish overall today and moving slow.  PAIN:  Are you having pain? No     TODAY'S TREATMENT:   MANUAL THERAPY: Hold 20sec x 4 each leg  - Manual stretch to B hamstrings -single knee to chest - Hip ER/IR -Piriformis -Lower trunk rotation   THEREX:   - Lower trunk rotation - 20 reps (slow cadence)  - Thoracic rotation - sideleye x 12 reps (open book) each side - Quadruped Full Range Thoracic  Rotation with Reach x 10 reps each.   GAIT TRAINING:  6 min walk: approx 450 feet using spc, close CGA- 6 stops to regroup as his trunk was getting ahead of him and increased left LE dragging today.      PATIENT EDUCATION: Education details: exercise technique. Person educated: Patient Education method: Explanation, Demonstration, Tactile cues, and Verbal cues Education comprehension: verbalized understanding, returned demonstration, verbal cues required, tactile cues required, and needs further education   HOME EXERCISE PROGRAM:  Access Code: [email or web address] URL: [email or web address] Date: 05/04/2022 Prepared by: Sande Brothers  Exercises - Sidelying Thoracic Rotation with Open Book  - 1 x daily - 3 sets - 10 reps - Supine Lower Trunk Rotation  - 1 x daily - 3 sets - 10 reps - Quadruped Full Range Thoracic Rotation with Reach  - 1 x daily - 3 sets - 10 reps  PT Short Term Goals -       PT SHORT TERM GOAL #1   Title Pt will be independent with HEP in order to improve strength and balance in order to decrease fall risk and improve function at home and work.    Baseline 07/21/2021- Paitent presents with no formal HEP in place. 08/21/2021- Patient reports compliance with HEP and states no questions as of today- able to perform standin using his rollator for support.    Time 6    Period Weeks    Status Achieved    Target Date 09/01/21              PT Long Term Goals -      PT LONG TERM GOAL #1   Title Pt will improve FOTO to target score of 53  to display perceived improvements in ability to complete ADL's.    Baseline 07/21/2021= 45; 1/5=51. 10/12/2021- Just reassessed for progress note so did not reassess again- will continue with goal into new certification 8/58: 85%    Time 12    Period Weeks    Status Achieved    Target Date 03/30/22      PT LONG TERM GOAL #2   Title Pt will improve BERG by at least 5 points in order to demonstrate clinically  significant improvement in balance.    Baseline 07/21/2021= 39/56; 08/21/2021=46/56- Will keep goal active to ensure patient able to test consistently; 10/05/2021= 52/56    Time 12    Period Weeks    Status Achieved    Target Date 10/13/21      PT LONG TERM GOAL #3   Title Pt will decrease 5TSTS by at least 5 seconds in order to demonstrate clinically significant improvement in LE strength.    Baseline 07/21/2021= 21 sec without UE Support; 11/21= 14.0 sec without UE support-.Will Keep goal active to ensure consistency.  10/05/2021=14.75 sec without UE support  (improved from 21 sec and unchanged from last assessment of 14 sec)    Time 12    Period Weeks    Status Achieved    Target Date 10/13/21      PT LONG TERM GOAL #4   Title Pt will decrease TUG to below  18 seconds/decrease in order to demonstrate decreased fall risk.    Baseline 07/21/2021= 23 sec with 4WW; 08/21/2021= 22.13 sec with 4WW; 10/05/2021= 19.51 sec avg with 4WW 2/10: 15.4 seconds with cane  12/14/2021= 18.0 sec    Time 12    Period Weeks    Status Achieved    Target Date 01/05/22      PT LONG TERM GOAL #5   Title Pt will increase 10MWT by at least 0.23 m/s in order to demonstrate clinically significant improvement in community ambulation.    Baseline 07/21/2021= 0.73ms; 08/21/2021= 0.57 m/s using 4WW; 10/05/2021= 0.6 m/s avg using 4WW 2/10: 0.51 m/s with cane. 12/14/2021= 0.65 m/s using SPC ; 04/20/22: .599 m/s SPC   Time 12    Period Weeks    Status Partially Met    Target Date 03/30/22      PT LONG TERM GOAL #6   Title Patient will increase six minute walk test distance to > 100 for progression to community ambulator and improve gait ability    Baseline 01/05/2022= 495 feet using SPC; 01/29/2022= 470 feet in 4 min 30 sec using SPC ; 04/20/2022: 478'   Time 12    Period Weeks    Status On-going  Target Date 03/30/22              Plan - 03/23/22 0855     Clinical Impression Statement Patient presented with  increased stiffness in left LE vs. Right and performed well with bed mobility to achieve correct positioning. He exhibited increased Left foot drag but patient reported feeling weaker overall today.  Patient will benefit from continued PT services to focus on LE strength and safety with mobility to improve overall mobility, decrease fall risk, and improve quality of life.     Personal Factors and Comorbidities Comorbidity 3+    Comorbidities A- Fib, Renal cancer, Addison's disease, gout, arthritis, DM    Examination-Activity Limitations Caring for Others;Carry;Dressing;Lift;Reach Overhead;Squat;Stairs;Transfers;Stand    Examination-Participation Restrictions Investment banker, operational;Yard Work;Shop    Stability/Clinical Decision Making Evolving/Moderate complexity    Rehab Potential Good    PT Frequency 1x / week    PT Duration 12 weeks    PT Treatment/Interventions ADLs/Self Care Home Management;Canalith Repostioning;Cryotherapy;Electrical Stimulation;Moist Heat;Traction;Ultrasound;DME Instruction;Gait training;Stair training;Functional mobility training;Therapeutic activities;Therapeutic exercise;Balance training;Neuromuscular re-education;Patient/family education;Orthotic Fit/Training;Wheelchair mobility training;Manual techniques;Passive range of motion;Dry needling;Energy conservation;Taping;Vestibular    PT Next Visit Plan Reassess gait speed ans 6MWT if symptoms improved. Please incorporate postural strengthening due to anterior trunk lean with gait.    PT Home Exercise Plan 08/17/2021-Access Code: [email or web address]; no updates    Consulted and Agree with Plan of Care Patient;Family member/caregiver    Family Member Consulted WIfe- [name]              Ollen Bowl, PT Physical Therapist- [facility]  05/04/2022, 11:59 AM

## 2022-05-07 LAB — CBC AND DIFFERENTIAL
HCT: 43 (ref 41–53)
Hemoglobin: 14.2 (ref 13.5–17.5)
Neutrophils Absolute: 3826
Platelets: 292 10*3/uL (ref 150–400)
WBC: 7.4

## 2022-05-07 LAB — LIPID PANEL
Cholesterol: 165 (ref 0–200)
HDL: 34 — AB (ref 35–70)
Triglycerides: 408 — AB (ref 40–160)

## 2022-05-07 LAB — HEPATIC FUNCTION PANEL
ALT: 19 U/L (ref 10–40)
AST: 16 (ref 14–40)
Alkaline Phosphatase: 83 (ref 25–125)
Bilirubin, Total: 0.6

## 2022-05-07 LAB — CBC: RBC: 5.09 (ref 3.87–5.11)

## 2022-05-07 LAB — COMPREHENSIVE METABOLIC PANEL
Albumin: 4.3 (ref 3.5–5.0)
Globulin: 2.3

## 2022-05-08 ENCOUNTER — Telehealth: Payer: Self-pay | Admitting: Nurse Practitioner

## 2022-05-08 ENCOUNTER — Encounter: Payer: Self-pay | Admitting: *Deleted

## 2022-05-08 DIAGNOSIS — M109 Gout, unspecified: Secondary | ICD-10-CM

## 2022-05-08 DIAGNOSIS — E781 Pure hyperglyceridemia: Secondary | ICD-10-CM

## 2022-05-08 MED ORDER — ALLOPURINOL 100 MG PO TABS: 100.0000 mg | ORAL_TABLET | Freq: Every day | ORAL | 6 refills | Status: DC

## 2022-05-08 MED ORDER — FENOFIBRATE 160 MG PO TABS: 160.0000 mg | ORAL_TABLET | Freq: Every day | ORAL | 1 refills | Status: DC

## 2022-05-08 NOTE — Telephone Encounter (Signed)
-----   Message from Albertina Sen[place] May, [place] sent at 05/08/2022 11:50 AM EDT ----- Abstracted to Chart.

## 2022-05-08 NOTE — Telephone Encounter (Signed)
Called pt and he is not taking flomax- went to urologist and they recommended myrbetriq which he is taking  Uric acid elevated at 9, discussed that he is at risk for ongoing flares, allopurinol 100 mg daily sent to pharmacy to decrease uric acid level. Unsure if he will take.   Elevated triglycerides noted- he reports he is vegan, encouraged to follow  low triglyceride diet and start fenofibrate 160 mg daily but he is unsure. Rx sent to pharmacy if he decides he wants to take,

## 2022-05-09 ENCOUNTER — Telehealth: Payer: Self-pay | Admitting: Nurse Practitioner

## 2022-05-09 NOTE — Telephone Encounter (Signed)
Patient called and was wanting to be seen by Dr. Damita Dunnings. Patient stated he was referred by Yvone Neu and Ok Edwards. Will Dr.Duncan be willing to see Tony Huffman? Thank you!

## 2022-05-10 NOTE — Telephone Encounter (Signed)
Please set up OV this fall when possible.  Thanks.

## 2022-05-11 ENCOUNTER — Ambulatory Visit: Payer: Medicare Other

## 2022-05-11 DIAGNOSIS — R269 Unspecified abnormalities of gait and mobility: Secondary | ICD-10-CM | POA: Diagnosis not present

## 2022-05-11 DIAGNOSIS — R262 Difficulty in walking, not elsewhere classified: Secondary | ICD-10-CM

## 2022-05-11 DIAGNOSIS — M6281 Muscle weakness (generalized): Secondary | ICD-10-CM

## 2022-05-11 DIAGNOSIS — R2681 Unsteadiness on feet: Secondary | ICD-10-CM

## 2022-05-11 DIAGNOSIS — R278 Other lack of coordination: Secondary | ICD-10-CM

## 2022-05-11 DIAGNOSIS — R2689 Other abnormalities of gait and mobility: Secondary | ICD-10-CM

## 2022-05-11 DIAGNOSIS — R296 Repeated falls: Secondary | ICD-10-CM

## 2022-05-11 NOTE — Therapy (Signed)
OUTPATIENT PHYSICAL THERAPY TREATMENT NOTE  Patient Name: Tony Huffman MRN: [id number] DOB:16-Feb-1946, 76 y.o., male Today's Date: 05/11/2022  PCP: Sherrie Mustache, NP REFERRING PROVIDER: Dr. Jennings Books   PT End of Session - 05/11/22 0858     Visit Number 12    Number of Visits 19    Date for PT Re-Evaluation 06/22/22    Authorization Type Medicare Part A & B; BCBS Secondary    Authorization Time Period 03/30/22-06/22/22    Progress Note Due on Visit 38    PT Start Time 0847    PT Stop Time 0927    PT Time Calculation (min) 40 min    Equipment Utilized During Treatment Gait belt    Activity Tolerance Patient tolerated treatment well;No increased pain    Behavior During Therapy [facility] for tasks assessed/performed             Past Medical History:  Diagnosis Date   Abnormal heart rhythm 10/20/2014   Addison's disease ([place])    Adrenal insufficiency ([place])    Allergy    Ambulates with cane    Anxiety    Arrhythmia    Cataract cortical, senile    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   Chronic kidney disease, stage 3 ([place]) 11/13/2011   Cough 03/29/2014   Edema leg 03/07/2017   Elevated cholesterol with high triglycerides 09/04/2013   Elevated red blood cell count    Erectile dysfunction    Falls frequently    Fever 03/29/2014   Generalized weakness 03/29/2014   GERD (gastroesophageal reflux disease)    Gout 09/04/2013   H/O adrenal insufficiency 05/17/2014   H/O atrial flutter 03/07/2017   H/O atrial flutter    H/O eye surgery    H/O sebaceous cyst 01/06/2018   H/O thyroid disease    H/O upper respiratory infection 10/15/2013   Hearing loss    History of back surgery    History of blepharoplasty    History of cancer 10/04/2015   History of chemotherapy    History of esophagogastroduodenoscopy (EGD)    History of prediabetes    Hyperlipidemia 06/16/2014   Hypertension    Hypertriglyceridemia    Hypothyroidism (acquired) 04/14/2014   Left leg swelling 11/23/2016    Low HDL (under 40)    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[place]) 11/10/2010   Malignant neoplasm of unspecified kidney, except renal pelvis (HCC)    OSA (obstructive sleep apnea) 07/24/2016   OSA (obstructive sleep apnea)    PAF (paroxysmal atrial fibrillation) (HCC)    PAF (paroxysmal atrial fibrillation) ([place])    Pancreatic mass 08/01/2018   Renal carcinoma ([place]) 06/16/2015   RLS (restless legs syndrome) 07/24/2016   Sleep apnea    Testicular hypofunction    Tubular adenoma 10/28/2017   Tussive syncopes 10/15/2013   Type 2 diabetes mellitus ([place])    Vitamin D deficiency disease    Past Surgical History:  Procedure Laterality Date   ADRENALECTOMY     CATARACT EXTRACTION  10/2014   CATARACT EXTRACTION EXTRACAPSULAR  11/05/2016   with Insertion Intraocular Prostheis.    COLONOSCOPY  05/06/2017   with removal lesions by snare.   COLONOSCOPY W/ BIOPSIES     05/06/2017   CYSTECTOMY     Upper Neck/Chest Area, [facility]   NEPHRECTOMY     POPLITEAL SYNOVIAL CYST EXCISION     SPINE SURGERY  1989   THYROIDECTOMY     THYROIDECTOMY, PARTIAL     TONSILLECTOMY     Patient Active Problem  List   Diagnosis Date Noted   Addisons disease ([place]) 03/16/2021   Low HDL (under 40) 03/16/2021   Difficulty sleeping 11/21/2020   Numbness and tingling 11/21/2020   Difficulty walking 01/14/2020   History of recent fall 01/14/2020   Sleep disorder 01/14/2020   Pancreatic mass 08/01/2018   H/O sebaceous cyst 01/06/2018   Tubular adenoma 10/28/2017   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   Edema leg 03/07/2017   Hx of atrial flutter 03/07/2017   OSA (obstructive sleep apnea) 07/24/2016   RLS (restless legs syndrome) 07/24/2016   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   Atrial fibrillation and flutter ([place]) 10/20/2014   Hyperlipidemia 06/16/2014   Adrenal crisis ([place]) 04/19/2014   Cough 03/29/2014   Weakness 03/29/2014   Cough syncope 10/15/2013   URI (upper  respiratory infection) 10/15/2013   Dyslipidemia 09/04/2013   Gout 09/04/2013   Chronic kidney disease, stage III (moderate) ([place]) 11/13/2011   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   Hypothyroidism (acquired) 11/30/2010   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[place]) 11/10/2010    REFERRING DIAG: Unspecified abnormalities of gait and mobility  THERAPY DIAG:  Abnormality of gait and mobility  Difficulty in walking, not elsewhere classified  Muscle weakness (generalized)  Other abnormalities of gait and mobility  Other lack of coordination  Repeated falls  Unsteadiness on feet  Rationale for Evaluation and Treatment Rehabilitation  PERTINENT HISTORY: Patient is a 76 year old male with referral from Neurology- Dr. Joselyn Arrow for unspecified abnormality of gait. He presents today with his wife and reports > 6 month history of progressive issues with poor balance and multiple falls. Patient has renal Cancer and currently has Pallative care- Was stabilized out of Hospice. Patient presents with the following past medical history: A-Fib and flutter, Lung Cancer, Obstructive sleep apnea, Upper respiratory infection, Addison's disease, Hypothyroidism, Renal Cancer, DM, gout, arthritis, Chronic kidney disease and Restless leg syndrome. Patient lives with wife in independent living at [facility]. Prior level of function was independent with transfers and mobility using walker yet some assist with dressing.   PRECAUTIONS: falls  SUBJECTIVE: Patient reports feeling a little sluggish overall today and moving slow. New medication still contributing. Reports 2 near falls last week arrested by a friend nearby. Pt reports LOB while stepping down a curb with SPC, wife adds that he was attempting a turn while stepping down.   PAIN:  Are you having pain? No     TODAY'S TREATMENT:  -Nustep, seat 10, arms 10, level 2 x 9 minutes, goal SPM >55-60 (using sousa marches as an auditory cue for tempo)   -overground AMB 145f c bari 4WW (unsafe distance despite cues)  -5lb AW in // bars 3 lengths forward, backward marching, sit rest break, lateral side stepping for 3 lengths -5lb Left AW lateral step ups 10x bilat, rest break -overground AMB c SPC RUE (better foot clearance than 4WW due to ability to naturally perform lateral weight shiting -6" fwd step ups step downs 180 degree turns x16 c SPC (emphasis on SPC for stability/righting, pt recalling prior cues for foot sequencing)    PATIENT EDUCATION: Education details: exercise technique. Person educated: Patient Education method: Explanation, Demonstration, Tactile cues, and Verbal cues Education comprehension: verbalized understanding, returned demonstration, verbal cues required, tactile cues required, and needs further education   HOME EXERCISE PROGRAM:  Access Code: [email or web address] URL: [email or web address] Date: 05/04/2022 Prepared by: JSande Brothers Exercises - Sidelying Thoracic Rotation with Open Book  - 1 x daily -  3 sets - 10 reps - Supine Lower Trunk Rotation  - 1 x daily - 3 sets - 10 reps - Quadruped Full Range Thoracic Rotation with Reach  - 1 x daily - 3 sets - 10 reps    PT Short Term Goals -       PT SHORT TERM GOAL #1   Title Pt will be independent with HEP in order to improve strength and balance in order to decrease fall risk and improve function at home and work.    Baseline 07/21/2021- Paitent presents with no formal HEP in place. 08/21/2021- Patient reports compliance with HEP and states no questions as of today- able to perform standin using his rollator for support.    Time 6    Period Weeks    Status Achieved    Target Date 09/01/21              PT Long Term Goals -      PT LONG TERM GOAL #1   Title Pt will improve FOTO to target score of 53  to display perceived improvements in ability to complete ADL's.    Baseline 07/21/2021= 45; 1/5=51. 10/12/2021- Just reassessed for  progress note so did not reassess again- will continue with goal into new certification 2/54: 27%    Time 12    Period Weeks    Status Achieved    Target Date 03/30/22      PT LONG TERM GOAL #2   Title Pt will improve BERG by at least 5 points in order to demonstrate clinically significant improvement in balance.    Baseline 07/21/2021= 39/56; 08/21/2021=46/56- Will keep goal active to ensure patient able to test consistently; 10/05/2021= 52/56    Time 12    Period Weeks    Status Achieved    Target Date 10/13/21      PT LONG TERM GOAL #3   Title Pt will decrease 5TSTS by at least 5 seconds in order to demonstrate clinically significant improvement in LE strength.    Baseline 07/21/2021= 21 sec without UE Support; 11/21= 14.0 sec without UE support-.Will Keep goal active to ensure consistency.  10/05/2021=14.75 sec without UE support  (improved from 21 sec and unchanged from last assessment of 14 sec)    Time 12    Period Weeks    Status Achieved    Target Date 10/13/21      PT LONG TERM GOAL #4   Title Pt will decrease TUG to below  18 seconds/decrease in order to demonstrate decreased fall risk.    Baseline 07/21/2021= 23 sec with 4WW; 08/21/2021= 22.13 sec with 4WW; 10/05/2021= 19.51 sec avg with 4WW 2/10: 15.4 seconds with cane  12/14/2021= 18.0 sec    Time 12    Period Weeks    Status Achieved    Target Date 01/05/22      PT LONG TERM GOAL #5   Title Pt will increase 10MWT by at least 0.23 m/s in order to demonstrate clinically significant improvement in community ambulation.    Baseline 07/21/2021= 0.63ms; 08/21/2021= 0.57 m/s using 4WW; 10/05/2021= 0.6 m/s avg using 4WW 2/10: 0.51 m/s with cane. 12/14/2021= 0.65 m/s using SPC ; 04/20/22: .599 m/s SPC   Time 12    Period Weeks    Status Partially Met    Target Date 03/30/22      PT LONG TERM GOAL #6   Title Patient will increase six minute walk test distance to > 100 for progression to  community ambulator and improve gait ability     Baseline 01/05/2022= 495 feet using SPC; 01/29/2022= 470 feet in 4 min 30 sec using SPC ; 04/20/2022: 478'   Time 12    Period Weeks    Status On-going    Target Date 03/30/22              Plan - 03/23/22 0855     Clinical Impression Statement Pt still groggy in mornings due to new medication. Balance continues to be off. Nustep still difficult from a velocity standpoint, unliklely related to resistance level but more bradykinetic tendencies.  Postural decomposition remains a safety risk factor in gait, seems to occur earlier in gait today, well before 120f. Patient will benefit from continued PT services to focus on LE strength and safety with mobility to improve overall mobility, decrease fall risk, and improve quality of life.     Personal Factors and Comorbidities Comorbidity 3+    Comorbidities A- Fib, Renal cancer, Addison's disease, gout, arthritis, DM    Examination-Activity Limitations Caring for Others;Carry;Dressing;Lift;Reach Overhead;Squat;Stairs;Transfers;Stand    Examination-Participation Restrictions CInvestment banker, operationalYard Work;Shop    Stability/Clinical Decision Making Evolving/Moderate complexity    Rehab Potential Good    PT Frequency 1x / week    PT Duration 12 weeks    PT Treatment/Interventions ADLs/Self Care Home Management;Canalith Repostioning;Cryotherapy;Electrical Stimulation;Moist Heat;Traction;Ultrasound;DME Instruction;Gait training;Stair training;Functional mobility training;Therapeutic activities;Therapeutic exercise;Balance training;Neuromuscular re-education;Patient/family education;Orthotic Fit/Training;Wheelchair mobility training;Manual techniques;Passive range of motion;Dry needling;Energy conservation;Taping;Vestibular    PT Next Visit Plan Reassess gait speed ans 6MWT if symptoms improved. Please incorporate postural strengthening due to anterior trunk lean with gait.    PT Home Exercise Plan 08/17/2021-Access Code: [email or web address]; no  updates    Consulted and Agree with Plan of Care Patient;Family member/caregiver    Family Member Consulted WIfe- [name]              9:18 AM, 05/11/22 AEtta Grandchild PT, DPT Physical Therapist - [phone]    05/11/2022, 9:18 AM

## 2022-05-11 NOTE — Telephone Encounter (Signed)
Called patient and LVM for him to call and set up new patient appointment.

## 2022-05-11 NOTE — Telephone Encounter (Signed)
Patient is scheduled for 05/24/22

## 2022-05-18 ENCOUNTER — Ambulatory Visit: Payer: Medicare Other

## 2022-05-18 DIAGNOSIS — R2681 Unsteadiness on feet: Secondary | ICD-10-CM

## 2022-05-18 DIAGNOSIS — R2689 Other abnormalities of gait and mobility: Secondary | ICD-10-CM

## 2022-05-18 DIAGNOSIS — R296 Repeated falls: Secondary | ICD-10-CM

## 2022-05-18 DIAGNOSIS — M6281 Muscle weakness (generalized): Secondary | ICD-10-CM

## 2022-05-18 DIAGNOSIS — R278 Other lack of coordination: Secondary | ICD-10-CM

## 2022-05-18 DIAGNOSIS — R262 Difficulty in walking, not elsewhere classified: Secondary | ICD-10-CM

## 2022-05-18 DIAGNOSIS — R269 Unspecified abnormalities of gait and mobility: Secondary | ICD-10-CM | POA: Diagnosis not present

## 2022-05-18 NOTE — Therapy (Signed)
OUTPATIENT PHYSICAL THERAPY TREATMENT NOTE  Patient Name: Tony Huffman MRN: [id number] DOB:11-20-1945, 76 y.o., male Today's Date: 05/18/2022  PCP: Sherrie Mustache, NP REFERRING PROVIDER: Dr. Jennings Books   PT End of Session - 05/18/22 0848     Visit Number 46    Number of Visits 78    Date for PT Re-Evaluation 06/22/22    Authorization Type Medicare Part A & B; BCBS Secondary    Authorization Time Period 03/30/22-06/22/22    Progress Note Due on Visit 78    PT Start Time 0845    PT Stop Time 0930    PT Time Calculation (min) 45 min    Equipment Utilized During Treatment Gait belt    Activity Tolerance Patient tolerated treatment well;No increased pain    Behavior During Therapy [facility] for tasks assessed/performed             Past Medical History:  Diagnosis Date   Abnormal heart rhythm 10/20/2014   Addison's disease ([place])    Adrenal insufficiency ([place])    Allergy    Ambulates with cane    Anxiety    Arrhythmia    Cataract cortical, senile    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   Chronic kidney disease, stage 3 ([place]) 11/13/2011   Cough 03/29/2014   Edema leg 03/07/2017   Elevated cholesterol with high triglycerides 09/04/2013   Elevated red blood cell count    Erectile dysfunction    Falls frequently    Fever 03/29/2014   Generalized weakness 03/29/2014   GERD (gastroesophageal reflux disease)    Gout 09/04/2013   H/O adrenal insufficiency 05/17/2014   H/O atrial flutter 03/07/2017   H/O atrial flutter    H/O eye surgery    H/O sebaceous cyst 01/06/2018   H/O thyroid disease    H/O upper respiratory infection 10/15/2013   Hearing loss    History of back surgery    History of blepharoplasty    History of cancer 10/04/2015   History of chemotherapy    History of esophagogastroduodenoscopy (EGD)    History of prediabetes    Hyperlipidemia 06/16/2014   Hypertension    Hypertriglyceridemia    Hypothyroidism (acquired) 04/14/2014   Left leg swelling 11/23/2016    Low HDL (under 40)    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[place]) 11/10/2010   Malignant neoplasm of unspecified kidney, except renal pelvis (HCC)    OSA (obstructive sleep apnea) 07/24/2016   OSA (obstructive sleep apnea)    PAF (paroxysmal atrial fibrillation) (HCC)    PAF (paroxysmal atrial fibrillation) ([place])    Pancreatic mass 08/01/2018   Renal carcinoma ([place]) 06/16/2015   RLS (restless legs syndrome) 07/24/2016   Sleep apnea    Testicular hypofunction    Tubular adenoma 10/28/2017   Tussive syncopes 10/15/2013   Type 2 diabetes mellitus ([place])    Vitamin D deficiency disease    Past Surgical History:  Procedure Laterality Date   ADRENALECTOMY     CATARACT EXTRACTION  10/2014   CATARACT EXTRACTION EXTRACAPSULAR  11/05/2016   with Insertion Intraocular Prostheis.    COLONOSCOPY  05/06/2017   with removal lesions by snare.   COLONOSCOPY W/ BIOPSIES     05/06/2017   CYSTECTOMY     Upper Neck/Chest Area, [facility]   NEPHRECTOMY     POPLITEAL SYNOVIAL CYST EXCISION     SPINE SURGERY  1989   THYROIDECTOMY     THYROIDECTOMY, PARTIAL     TONSILLECTOMY     Patient Active Problem  List   Diagnosis Date Noted   Addisons disease ([place]) 03/16/2021   Low HDL (under 40) 03/16/2021   Difficulty sleeping 11/21/2020   Numbness and tingling 11/21/2020   Difficulty walking 01/14/2020   History of recent fall 01/14/2020   Sleep disorder 01/14/2020   Pancreatic mass 08/01/2018   H/O sebaceous cyst 01/06/2018   Tubular adenoma 10/28/2017   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   Edema leg 03/07/2017   Hx of atrial flutter 03/07/2017   OSA (obstructive sleep apnea) 07/24/2016   RLS (restless legs syndrome) 07/24/2016   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   Atrial fibrillation and flutter ([place]) 10/20/2014   Hyperlipidemia 06/16/2014   Adrenal crisis ([place]) 04/19/2014   Cough 03/29/2014   Weakness 03/29/2014   Cough syncope 10/15/2013   URI (upper  respiratory infection) 10/15/2013   Dyslipidemia 09/04/2013   Gout 09/04/2013   Chronic kidney disease, stage III (moderate) ([place]) 11/13/2011   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   Hypothyroidism (acquired) 11/30/2010   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 Joselyn Arrow for unspecified abnormality of gait. He presents today with his wife and reports > 6 month history of progressive issues with poor balance and multiple falls. Patient has renal Cancer and currently has Pallative care- Was stabilized out of Hospice. Patient presents with the following past medical history: A-Fib and flutter, Lung Cancer, Obstructive sleep apnea, Upper respiratory infection, Addison's disease, Hypothyroidism, Renal Cancer, DM, gout, arthritis, Chronic kidney disease and Restless leg syndrome. Patient lives with wife in independent living at [facility]. Prior level of function was independent with transfers and mobility using walker yet some assist with dressing.   PRECAUTIONS: falls  SUBJECTIVE: Pt reports no falls. Overall remains the same functionally with [facility] and rollator use.   PAIN:  Are you having pain? No     TODAY'S TREATMENT: 05/18/22 Nu- Step, seat 10, arms 10, level 2 for 6 minutes SPM > 55.   Ambulation with SPC 2 x150' CGA. Mod VC's for LLE step lengths and step height. Does require seated rest during second bout as pt demonstrating worsening LLE steps and foot clearance leading to dragging limb. 2 min seated rest with improvement in LLE  clearance and step lengths after seated rest.   Alternating, static 5# AW marches to RTB as visual target: x20/LE with BUE support. Some R lateral lean to elevate LLE  Heel walking in // bars with BUE support: x4 laps for ankle DF strengthening. R hip pain and mild buckling of R knee.    Alternating heel to toe raises: x20/LE   Forwards 6" step ups with BUE support x10/LE   Lateral 6" step ups with BUE support x6/LE   Backwards ambulation with SPC in RUE in // bars: x4 laps for posterior chain strengthening, CGA   Standing scap retractions with blue TB  x20 feet normal width apart      Standing tandem RLE forwards x20   PATIENT EDUCATION: Education details: exercise technique.  Person educated: Patient Education method: Explanation, Demonstration, Tactile cues, and Verbal cues Education comprehension:  verbalized understanding, returned demonstration, verbal cues required, tactile cues required, and needs further education   HOME EXERCISE PROGRAM:  Access Code: [email or web address] URL: [email or web address] Date: 05/04/2022 Prepared by: Sande Brothers  Exercises - Sidelying Thoracic Rotation with Open Book  - 1 x daily - 3 sets - 10 reps - Supine Lower Trunk Rotation  - 1 x daily - 3 sets - 10 reps - Quadruped Full Range Thoracic Rotation with Reach  - 1 x daily - 3 sets - 10 reps    PT Short Term Goals -       PT SHORT TERM GOAL #1   Title Pt will be independent with HEP in order to improve strength and balance in order to decrease fall risk and improve function at home and work.    Baseline 07/21/2021- Paitent presents with no formal HEP in place. 08/21/2021- Patient reports compliance with HEP and states no questions as of today- able to perform standin using his rollator for support.    Time 6    Period Weeks    Status Achieved    Target Date 09/01/21              PT Long Term Goals -      PT LONG TERM GOAL #1   Title Pt will improve FOTO to  target score of 53  to display perceived improvements in ability to complete ADL's.    Baseline 07/21/2021= 45; 1/5=51. 10/12/2021- Just reassessed for progress note so did not reassess again- will continue with goal into new certification 6/78: 93%    Time 12    Period Weeks    Status Achieved    Target Date 03/30/22      PT LONG TERM GOAL #2   Title Pt will improve BERG by at least 5 points in order to demonstrate clinically significant improvement in balance.    Baseline 07/21/2021= 39/56; 08/21/2021=46/56- Will keep goal active to ensure patient able to test consistently; 10/05/2021= 52/56    Time 12    Period Weeks    Status Achieved    Target Date 10/13/21      PT LONG TERM GOAL #3   Title Pt will decrease 5TSTS by at least 5 seconds in order to demonstrate clinically significant improvement in LE strength.    Baseline 07/21/2021= 21 sec without UE Support; 11/21= 14.0 sec without UE support-.Will Keep goal active to ensure consistency.  10/05/2021=14.75 sec without UE support  (improved from 21 sec and unchanged from last assessment of 14 sec)    Time 12    Period Weeks    Status Achieved    Target Date 10/13/21      PT LONG TERM GOAL #4   Title Pt will decrease TUG to below  18 seconds/decrease in order to demonstrate decreased fall risk.    Baseline 07/21/2021= 23 sec with 4WW; 08/21/2021= 22.13 sec with 4WW; 10/05/2021= 19.51 sec avg with 4WW 2/10: 15.4 seconds with cane  12/14/2021= 18.0 sec    Time 12    Period Weeks    Status Achieved    Target Date 01/05/22      PT LONG TERM GOAL #5   Title Pt will increase 10MWT by at least 0.23 m/s in order to demonstrate clinically significant improvement in community ambulation.    Baseline 07/21/2021= 0.39ms; 08/21/2021= 0.57 m/s using 4WW; 10/05/2021= 0.6 m/s avg using 4WW 2/10: 0.51 m/s with cane. 12/14/2021= 0.65 m/s using SPC ; 04/20/22: .599 m/s SPC  Time 12    Period Weeks    Status Partially Met    Target Date 03/30/22      PT  LONG TERM GOAL #6   Title Patient will increase six minute walk test distance to > 100 for progression to community ambulator and improve gait ability    Baseline 01/05/2022= 495 feet using SPC; 01/29/2022= 470 feet in 4 min 30 sec using SPC ; 04/20/2022: 478'   Time 12    Period Weeks    Status On-going    Target Date 03/30/22              Plan - 03/23/22 0855     Clinical Impression Statement Continuing PT POC with continuing to improve velocity of limb movements and LLE strengthening. Incorporating postural stability and strengthening in attempts to prevent anterior trunk lean which pt commonly performs with gait that worsens as LLE becomes fatigued. Pt continues to remain at increase risk for falls due to posture and LLE weakness. Pt will continue to benefit from skilled PT services to improve risk for falls via postural and gait stability and strengthening.     Personal Factors and Comorbidities Comorbidity 3+    Comorbidities A- Fib, Renal cancer, Addison's disease, gout, arthritis, DM    Examination-Activity Limitations Caring for Others;Carry;Dressing;Lift;Reach Overhead;Squat;Stairs;Transfers;Stand    Examination-Participation Restrictions Investment banker, operational;Yard Work;Shop    Stability/Clinical Decision Making Evolving/Moderate complexity    Rehab Potential Good    PT Frequency 1x / week    PT Duration 12 weeks    PT Treatment/Interventions ADLs/Self Care Home Management;Canalith Repostioning;Cryotherapy;Electrical Stimulation;Moist Heat;Traction;Ultrasound;DME Instruction;Gait training;Stair training;Functional mobility training;Therapeutic activities;Therapeutic exercise;Balance training;Neuromuscular re-education;Patient/family education;Orthotic Fit/Training;Wheelchair mobility training;Manual techniques;Passive range of motion;Dry needling;Energy conservation;Taping;Vestibular    PT Next Visit Plan Reassess gait speed ans 6MWT if symptoms improved. Please  incorporate postural strengthening due to anterior trunk lean with gait.    PT Home Exercise Plan 08/17/2021-Access Code: [email or web address]; no updates    Consulted and Agree with Plan of Care Patient;Family member/caregiver    Family Member Consulted WIfe- Carlean Jews. Fairly IV, PT, DPT Physical Therapist- [facility]  05/18/2022, 9:46 AM

## 2022-05-24 ENCOUNTER — Encounter: Payer: Self-pay | Admitting: Family Medicine

## 2022-05-24 ENCOUNTER — Ambulatory Visit (INDEPENDENT_AMBULATORY_CARE_PROVIDER_SITE_OTHER): Payer: Medicare Other | Admitting: Family Medicine

## 2022-05-24 DIAGNOSIS — G2581 Restless legs syndrome: Secondary | ICD-10-CM | POA: Diagnosis not present

## 2022-05-24 DIAGNOSIS — E119 Type 2 diabetes mellitus without complications: Secondary | ICD-10-CM

## 2022-05-24 DIAGNOSIS — R269 Unspecified abnormalities of gait and mobility: Secondary | ICD-10-CM | POA: Diagnosis not present

## 2022-05-24 DIAGNOSIS — E271 Primary adrenocortical insufficiency: Secondary | ICD-10-CM | POA: Diagnosis not present

## 2022-05-24 DIAGNOSIS — M109 Gout, unspecified: Secondary | ICD-10-CM

## 2022-05-24 DIAGNOSIS — Z7189 Other specified counseling: Secondary | ICD-10-CM

## 2022-05-24 DIAGNOSIS — K8689 Other specified diseases of pancreas: Secondary | ICD-10-CM

## 2022-05-24 NOTE — Patient Instructions (Signed)
Let me check back on your old records.   Don't change your meds for now.  Consider if you would like to go through follow up pancreatic imaging.  Take care.  Glad to see you.

## 2022-05-24 NOTE — Progress Notes (Signed)
New patient to establish care.  Wife [name] designated if patient were incapacitated.  He stated that he wanted to focus on quality of life and avoid invasive procedures.  Gout controlled usually w/o allopurinol, d/w pt about diet.    Adrenal insufficiency/history of renal cancer.  He had previous nephrectomy and adrenalectomy.  Has been followed by urology, endocrinology, oncology.  He moved to hospice care but "graduated" from hospice in the meantime.  We talked about his steroid replacement per endocrinology with routine instructions to increase during periods of high stress/illness to prevent adrenal crisis.  He is aware of all of that.  We talked about his previous pancreatic mass, seen on prior imaging:  Pancreas: Enhancing mass at the pancreatic head measures 3.0 x 1.9 cm  (series 2 image 139), previously 2.4 x 1.8 cm. No pancreatic ductal  dilation.   We talked about potentially repeating imaging of his pancreas.  He is not interested in invasive procedures and wants to focus on quality of life but it may be useful for planning going forward.  I told him I wanted to consider his situation and review his old records and he was going to consider his situation in the meantime.  RLS.  Gabapentin helped.  No ADE on med.  He has noted occasional memory lapses but no red flag events.  He has noted occasional balance changes.  He has follow-up with neurology pending for October of this year.  He has been in PT and it is helped with his gait in the meantime.  Diabetes and hypothyroidism.  Per endocrinology.  I will defer.  He agrees.  Compliant with levothyroxine and metformin.  H/o A flutter/PAF noted.  Not currently anticoagulated.  Former Data processing manager, discussed.    Meds, vitals, and allergies reviewed.   ROS: Per HPI unless specifically indicated in ROS section   GEN: nad, alert and oriented HEENT: ncat NECK: supple w/o LA CV: rrr PULM: ctab, no inc wob ABD: soft, +bs EXT: no  edema SKIN: Well-perfused. Chronic L eyelid ptosis.   Slightly lower L lip- asymmetry is mild and apparently not an acute finding.  30 minutes were devoted to patient care in this encounter (this includes time spent reviewing the patient's file/history, interviewing and examining the patient, counseling/reviewing plan with patient).

## 2022-05-25 ENCOUNTER — Ambulatory Visit: Payer: Medicare Other

## 2022-05-25 DIAGNOSIS — R269 Unspecified abnormalities of gait and mobility: Secondary | ICD-10-CM | POA: Diagnosis not present

## 2022-05-25 DIAGNOSIS — M6281 Muscle weakness (generalized): Secondary | ICD-10-CM

## 2022-05-25 DIAGNOSIS — R2681 Unsteadiness on feet: Secondary | ICD-10-CM

## 2022-05-25 DIAGNOSIS — R2689 Other abnormalities of gait and mobility: Secondary | ICD-10-CM

## 2022-05-25 DIAGNOSIS — R278 Other lack of coordination: Secondary | ICD-10-CM

## 2022-05-25 DIAGNOSIS — R262 Difficulty in walking, not elsewhere classified: Secondary | ICD-10-CM

## 2022-05-25 DIAGNOSIS — R296 Repeated falls: Secondary | ICD-10-CM

## 2022-05-25 NOTE — Therapy (Signed)
OUTPATIENT PHYSICAL THERAPY TREATMENT NOTE  Patient Name: Tony Huffman MRN: [id number] DOB:[birth year], 76 y.o., male Today's Date: 05/25/2022  PCP: Sherrie Mustache, NP REFERRING PROVIDER: Dr. Jennings Books   PT End of Session - 05/25/22 0856     Visit Number 65    Number of Visits 62    Date for PT Re-Evaluation 06/22/22    Authorization Type Medicare Part A & B; BCBS Secondary    Authorization Time Period 03/30/22-06/22/22    Progress Note Due on Visit 17    PT Start Time 0847    PT Stop Time 0930    PT Time Calculation (min) 43 min    Equipment Utilized During Treatment Gait belt    Activity Tolerance Patient tolerated treatment well;No increased pain    Behavior During Therapy [facility] for tasks assessed/performed              Past Medical History:  Diagnosis Date   Abnormal heart rhythm 10/20/2014   Addison's disease ([place])    Adrenal insufficiency ([place])    Allergy    Ambulates with cane    Anxiety    Arrhythmia    Cataract cortical, senile    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   Chronic kidney disease, stage 3 ([place]) 11/13/2011   Cough 03/29/2014   Edema leg 03/07/2017   Elevated cholesterol with high triglycerides 09/04/2013   Elevated red blood cell count    Erectile dysfunction    Falls frequently    Fever 03/29/2014   Generalized weakness 03/29/2014   GERD (gastroesophageal reflux disease)    Gout 09/04/2013   H/O adrenal insufficiency 05/17/2014   H/O atrial flutter 03/07/2017   H/O atrial flutter    H/O eye surgery    H/O sebaceous cyst 01/06/2018   H/O thyroid disease    H/O upper respiratory infection 10/15/2013   Hearing loss    History of back surgery    History of blepharoplasty    History of cancer 10/04/2015   History of chemotherapy    History of esophagogastroduodenoscopy (EGD)    History of prediabetes    Hyperlipidemia 06/16/2014   Hypertension    Hypertriglyceridemia    Hypothyroidism (acquired) 04/14/2014   Left leg swelling  11/23/2016   Low HDL (under 40)    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[place]) 11/10/2010   Malignant neoplasm of unspecified kidney, except renal pelvis (HCC)    OSA (obstructive sleep apnea) 07/24/2016   OSA (obstructive sleep apnea)    PAF (paroxysmal atrial fibrillation) (HCC)    PAF (paroxysmal atrial fibrillation) ([place])    Pancreatic mass 08/01/2018   Renal carcinoma ([place]) 06/16/2015   RLS (restless legs syndrome) 07/24/2016   Sleep apnea    Testicular hypofunction    Tubular adenoma 10/28/2017   Tussive syncopes 10/15/2013   Type 2 diabetes mellitus ([place])    Vitamin D deficiency disease    Past Surgical History:  Procedure Laterality Date   ADRENALECTOMY     CATARACT EXTRACTION  10/2014   CATARACT EXTRACTION EXTRACAPSULAR  11/05/2016   with Insertion Intraocular Prostheis.    COLONOSCOPY  05/06/2017   with removal lesions by snare.   COLONOSCOPY W/ BIOPSIES     05/06/2017   CYSTECTOMY     Upper Neck/Chest Area, [facility]   NEPHRECTOMY     POPLITEAL SYNOVIAL CYST EXCISION     SPINE SURGERY  1989   THYROIDECTOMY     THYROIDECTOMY, PARTIAL     TONSILLECTOMY     Patient Active  Problem List   Diagnosis Date Noted   Addisons disease ([place]) 03/16/2021   Low HDL (under 40) 03/16/2021   Difficulty sleeping 11/21/2020   Numbness and tingling 11/21/2020   Difficulty walking 01/14/2020   History of recent fall 01/14/2020   Sleep disorder 01/14/2020   Pancreatic mass 08/01/2018   H/O sebaceous cyst 01/06/2018   Tubular adenoma 10/28/2017   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   Edema leg 03/07/2017   Hx of atrial flutter 03/07/2017   OSA (obstructive sleep apnea) 07/24/2016   RLS (restless legs syndrome) 07/24/2016   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   Atrial fibrillation and flutter ([place]) 10/20/2014   Hyperlipidemia 06/16/2014   Adrenal crisis ([place]) 04/19/2014   Cough 03/29/2014   Weakness 03/29/2014   Cough syncope 10/15/2013    URI (upper respiratory infection) 10/15/2013   Dyslipidemia 09/04/2013   Gout 09/04/2013   Chronic kidney disease, stage III (moderate) ([place]) 11/13/2011   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   Hypothyroidism (acquired) 11/30/2010   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[place]) 11/10/2010    REFERRING DIAG: Unspecified abnormalities of gait and mobility  THERAPY DIAG:  Abnormality of gait and mobility  Difficulty in walking, not elsewhere classified  Muscle weakness (generalized)  Other abnormalities of gait and mobility  Other lack of coordination  Repeated falls  Unsteadiness on feet  Rationale for Evaluation and Treatment Rehabilitation  PERTINENT HISTORY: Patient is a 76 year old male with referral from Neurology- Dr. Joselyn Arrow for unspecified abnormality of gait. He presents today with his wife and reports > 6 month history of progressive issues with poor balance and multiple falls. Patient has renal Cancer and currently has Pallative care- Was stabilized out of Hospice. Patient presents with the following past medical history: A-Fib and flutter, Lung Cancer, Obstructive sleep apnea, Upper respiratory infection, Addison's disease, Hypothyroidism, Renal Cancer, DM, gout, arthritis, Chronic kidney disease and Restless leg syndrome. Patient lives with wife in independent living at [facility]. Prior level of function was independent with transfers and mobility using walker yet some assist with dressing.   PRECAUTIONS: falls  SUBJECTIVE: Patient reports having a good week and continues to practice mostly walking with cane at this time.    PAIN:  Are you having pain? No     TODAY'S TREATMENT: 05/18/22    Therex:  Nu- Step, seat 10, L2 -L4 for 6 min total SPM > 55.   Alternating Hip swings - forward/backward 2 sets of 12 reps.   Sit to stand without UE support x 12 reps total  Forwards 6" step ups with BUE support x10/LE   Neuromuscular re-ed:    Dual task at dry erase  board- Staggered stand on firm surface while arranging letter magnets in alphabetical order (switch stance every 6 letters)   Dual task at dry erase board- Staggered stand on airex pad while arranging letter magnets in alphabetical Order (Switch stance x 4 throughout act)   Dual task at dry erase board- Word game while standing near tandem on airex pad x several min (switching stance every min or so)   PATIENT EDUCATION: Education details: exercise technique.  Person educated: Patient Education method: Explanation, Demonstration, Tactile cues, and Verbal cues Education comprehension: verbalized understanding, returned demonstration, verbal cues required, tactile cues required, and needs further education   HOME EXERCISE PROGRAM:  Access Code: [email or web address] URL: [email or web address] Date: 05/04/2022 Prepared by: Sande Brothers  Exercises - Sidelying Thoracic Rotation with Open Book  - 1 x daily -  3 sets - 10 reps - Supine Lower Trunk Rotation  - 1 x daily - 3 sets - 10 reps - Quadruped Full Range Thoracic Rotation with Reach  - 1 x daily - 3 sets - 10 reps    PT Short Term Goals -       PT SHORT TERM GOAL #1   Title Pt will be independent with HEP in order to improve strength and balance in order to decrease fall risk and improve function at home and work.    Baseline 07/21/2021- Paitent presents with no formal HEP in place. 08/21/2021- Patient reports compliance with HEP and states no questions as of today- able to perform standin using his rollator for support.    Time 6    Period Weeks    Status Achieved    Target Date 09/01/21              PT Long Term Goals -      PT LONG TERM GOAL #1   Title Pt will improve FOTO to target score of 53  to display perceived improvements in ability to complete ADL's.    Baseline 07/21/2021= 45; 1/5=51. 10/12/2021- Just reassessed for progress note so did not reassess again- will continue with goal into new certification  3/81: 49%    Time 12    Period Weeks    Status Achieved    Target Date 03/30/22      PT LONG TERM GOAL #2   Title Pt will improve BERG by at least 5 points in order to demonstrate clinically significant improvement in balance.    Baseline 07/21/2021= 39/56; 08/21/2021=46/56- Will keep goal active to ensure patient able to test consistently; 10/05/2021= 52/56    Time 12    Period Weeks    Status Achieved    Target Date 10/13/21      PT LONG TERM GOAL #3   Title Pt will decrease 5TSTS by at least 5 seconds in order to demonstrate clinically significant improvement in LE strength.    Baseline 07/21/2021= 21 sec without UE Support; 11/21= 14.0 sec without UE support-.Will Keep goal active to ensure consistency.  10/05/2021=14.75 sec without UE support  (improved from 21 sec and unchanged from last assessment of 14 sec)    Time 12    Period Weeks    Status Achieved    Target Date 10/13/21      PT LONG TERM GOAL #4   Title Pt will decrease TUG to below  18 seconds/decrease in order to demonstrate decreased fall risk.    Baseline 07/21/2021= 23 sec with 4WW; 08/21/2021= 22.13 sec with 4WW; 10/05/2021= 19.51 sec avg with 4WW 2/10: 15.4 seconds with cane  12/14/2021= 18.0 sec    Time 12    Period Weeks    Status Achieved    Target Date 01/05/22      PT LONG TERM GOAL #5   Title Pt will increase 10MWT by at least 0.23 m/s in order to demonstrate clinically significant improvement in community ambulation.    Baseline 07/21/2021= 0.8m/s; 08/21/2021= 0.57 m/s using 4WW; 10/05/2021= 0.6 m/s avg using 4WW 2/10: 0.51 m/s with cane. 12/14/2021= 0.65 m/s using SPC ; 04/20/22: .599 m/s SPC   Time 12    Period Weeks    Status Partially Met    Target Date 03/30/22      PT LONG TERM GOAL #6   Title Patient will increase six minute walk test distance to > 100 for progression to  community ambulator and improve gait ability    Baseline 01/05/2022= 495 feet using SPC; 01/29/2022= 470 feet in 4 min 30 sec using SPC ;  04/20/2022: 478'   Time 12    Period Weeks    Status On-going    Target Date 03/30/22              Plan - 03/23/22 0855     Clinical Impression Statement Continuing focusing on LE strength and standing balance. Patient challenged with dual task activities and fatigued with some LOB during mostly staggered and tandem requiring physical assist to keep from falling. Otherwise as far as straight forward strengthening- patient performed well bilaterally without significant fatigue in left LE. Pt continues to remain at increase risk for falls due to posture and LLE weakness. Pt will continue to benefit from skilled PT services to improve risk for falls via postural and gait stability and strengthening.     Personal Factors and Comorbidities Comorbidity 3+    Comorbidities A- Fib, Renal cancer, Addison's disease, gout, arthritis, DM    Examination-Activity Limitations Caring for Others;Carry;Dressing;Lift;Reach Overhead;Squat;Stairs;Transfers;Stand    Examination-Participation Restrictions Investment banker, operational;Yard Work;Shop    Stability/Clinical Decision Making Evolving/Moderate complexity    Rehab Potential Good    PT Frequency 1x / week    PT Duration 12 weeks    PT Treatment/Interventions ADLs/Self Care Home Management;Canalith Repostioning;Cryotherapy;Electrical Stimulation;Moist Heat;Traction;Ultrasound;DME Instruction;Gait training;Stair training;Functional mobility training;Therapeutic activities;Therapeutic exercise;Balance training;Neuromuscular re-education;Patient/family education;Orthotic Fit/Training;Wheelchair mobility training;Manual techniques;Passive range of motion;Dry needling;Energy conservation;Taping;Vestibular    PT Next Visit Plan Reassess gait speed ans 6MWT if symptoms improved. Please incorporate postural strengthening due to anterior trunk lean with gait.    PT Home Exercise Plan 08/17/2021-Access Code: [email or web address]; no updates    Consulted and Agree with  Plan of Care Patient;Family member/caregiver    Family Member Consulted WIfe- [name]             Ollen Bowl, PT Physical Therapist- [facility]  05/25/2022, 11:36 AM

## 2022-05-27 ENCOUNTER — Encounter: Payer: Self-pay | Admitting: Family Medicine

## 2022-05-27 DIAGNOSIS — Z7189 Other specified counseling: Secondary | ICD-10-CM | POA: Insufficient documentation

## 2022-05-27 DIAGNOSIS — E119 Type 2 diabetes mellitus without complications: Secondary | ICD-10-CM | POA: Insufficient documentation

## 2022-05-27 DIAGNOSIS — R269 Unspecified abnormalities of gait and mobility: Secondary | ICD-10-CM | POA: Insufficient documentation

## 2022-05-27 NOTE — Assessment & Plan Note (Signed)
Usually diet controlled.  He will update me as needed.

## 2022-05-27 NOTE — Assessment & Plan Note (Signed)
Per endocrinology 

## 2022-05-27 NOTE — Assessment & Plan Note (Signed)
Continue gabapentin.

## 2022-05-27 NOTE — Assessment & Plan Note (Signed)
  Wife Tony Huffman designated if patient were incapacitated.  He stated that he wanted to focus on quality of life and avoid invasive procedures.

## 2022-05-27 NOTE — Assessment & Plan Note (Signed)
He is going to follow-up with neurology.  No think it makes sense to continue with PT.  I will await neurology follow-up note.

## 2022-05-27 NOTE — Assessment & Plan Note (Signed)
We talked about his prior imaging.  Pancreas: Enhancing mass at the pancreatic head measures 3.0 x 1.9 cm (series 2 image 139), previously 2.4 x 1.8 cm. No pancreatic ductal dilation.   We talked about potentially repeating imaging of that.  He is not interested in invasive procedures and wants to focus on quality of life but it may be useful for planning going forward.  I told him I wanted to consider his situation and review his old records and he was going to consider his situation in the meantime.

## 2022-05-28 ENCOUNTER — Encounter: Payer: Self-pay | Admitting: Family Medicine

## 2022-05-28 ENCOUNTER — Other Ambulatory Visit: Payer: Medicare Other | Admitting: Student

## 2022-05-28 DIAGNOSIS — R269 Unspecified abnormalities of gait and mobility: Secondary | ICD-10-CM

## 2022-05-28 DIAGNOSIS — C649 Malignant neoplasm of unspecified kidney, except renal pelvis: Secondary | ICD-10-CM

## 2022-05-28 DIAGNOSIS — M109 Gout, unspecified: Secondary | ICD-10-CM

## 2022-05-28 DIAGNOSIS — R350 Frequency of micturition: Secondary | ICD-10-CM

## 2022-05-28 DIAGNOSIS — G259 Extrapyramidal and movement disorder, unspecified: Secondary | ICD-10-CM

## 2022-05-28 DIAGNOSIS — Z515 Encounter for palliative care: Secondary | ICD-10-CM

## 2022-05-28 NOTE — Progress Notes (Signed)
[facility] Palliative Care Consult Note Telephone: [phone]  Fax: [phone]    Date of encounter: 05/28/22 11:04 AM PATIENT NAME: Tony Huffman [address] [id number]   [phone] (home)  DOB: 1946-09-26 MRN: [id number] PRIMARY CARE PROVIDER:    Tonia Ghent, MD,  [place] [address] [phone]  REFERRING PROVIDER:   [name], NP [place],  [place] [address] [phone]  RESPONSIBLE PARTY:    Contact Information     Name Relation Home Work Mobile   [place], [place]   [phone]        I met face to face with patient in the home. Palliative Care was asked to follow this patient by consultation request of  [name], NP to address advance care planning and complex medical decision making. This is a follow up visit.                                   ASSESSMENT AND PLAN / RECOMMENDATIONS:   Advance Care Planning/Goals of Care: Goals include to maximize quality of life and symptom management. Patient/health care surrogate gave his/her permission to discuss. Our advance care planning conversation included a discussion about:    The value and importance of advance care planning  Experiences with loved ones who have been seriously ill or have died  Exploration of personal, cultural or spiritual beliefs that might influence medical decisions  Exploration of goals of care in the event of a sudden injury or illness  CODE STATUS: DNR  Education provided on Palliative Medicine. Vs. Hospice services. Patient states he is going to contact PCP regarding having imaging of pancreas. He does state he would likely not pursue biopsy or invasive procedures.   Symptom Management/Plan:  Metastatic renal carcinoma-wishes to continue on comfort path. Previously on hospice, discharged d/t stability. He has recently seen PCP and would like to have repeat imaging of his  pancreas; hx of pancreatic mass. He does not want to have any biopsies.   Urinary frequency-patient has been started on Myrbetriq 50 mg daily; he does not improvement in symptoms. He is to follow up with urology as scheduled.   Gout flare up-patient with gout flare up in the past month of his left great toe. Took colchicine with effectiveness.  Movement disorder, abnormal gait, imbalance- patient receiving outpatient therapy once a week. He reports one fall in the past month. He feels therapy is helping. He is encouraged to use his walker for increased stability. Follow up with neurology as scheduled.  Follow up Palliative Care Visit: Palliative care will continue to follow for complex medical decision making, advance care planning, and clarification of goals. Return in 8 weeks or prn.   This visit was coded based on medical decision making (MDM).  PPS: 50%  HOSPICE ELIGIBILITY/DIAGNOSIS: TBD  Chief Complaint: Palliative Medicine follow up visit.   HISTORY OF PRESENT ILLNESS:  Tony Huffman is a 76 y.o. year old male  with metastatic renal cell carcinoma, Addison's disease, polyneuropathy, T2DM, hyperlipidemia, hypertension, atrial fibrillation, vitamin D deficiency, OSA, RLS, chronic gouty arthritis.     Patient reports doing well overall. He endorses good appetite. He fell x 1 when his wife was out of town. He ended up staying at [facility] for respite while wife was out of town. He is receiving therapy once a week on  Fridays. Feels like therapy is helping. Patient is sleeping well; gabapentin helping with sleep. He reported having increased urinary frequency; was seen by urology. He was started on Myrbetriq with improvement.  History obtained from review of EMR, discussion with primary team, and interview with family, facility staff/caregiver and/or Mr. Rodenberg.  I reviewed available labs, medications, imaging, studies and related documents from the EMR.  Records reviewed and summarized  above.   ROS   A 10 point review of systems is negative, except for the pertinent positives and negatives detailed in the HPI.  Physical Exam: Pulse 72, resp 16, b/p 130/72, sats 95% on room air Constitutional: NAD General: frail appearing EYES: anicteric sclera, lids intact, no discharge  ENMT: intact hearing, oral mucous membranes moist, dentition intact CV: S1S2, RRR, no LE edema Pulmonary: LCTA, no increased work of breathing, no cough, room air Abdomen: normo-active BS + 4 quadrants, soft and non tender, no ascites GU: deferred MSK:  moves all extremities, ambulatory Skin: warm and dry, no rashes or wounds on visible skin Neuro: generalized weakness, A & O x 3, forgetful Psych: non-anxious affect, pleasant Hem/lymph/immuno: no widespread bruising   Thank you for the opportunity to participate in the care of Mr. Flinchum.  The palliative care team will continue to follow. Please call our office at [phone] if we can be of additional assistance.   Ezekiel Slocumb, NP   COVID-19 PATIENT SCREENING TOOL Asked and negative response unless otherwise noted:   Have you had symptoms of covid, tested positive or been in contact with someone with symptoms/positive test in the past 5-10 days? No

## 2022-05-29 ENCOUNTER — Ambulatory Visit (INDEPENDENT_AMBULATORY_CARE_PROVIDER_SITE_OTHER): Payer: Medicare Other | Admitting: Urology

## 2022-05-29 ENCOUNTER — Encounter: Payer: Self-pay | Admitting: Urology

## 2022-05-29 VITALS — BP 120/79 | HR 71 | Ht 68.0 in | Wt 207.0 lb

## 2022-05-29 DIAGNOSIS — N3281 Overactive bladder: Secondary | ICD-10-CM

## 2022-05-29 DIAGNOSIS — R399 Unspecified symptoms and signs involving the genitourinary system: Secondary | ICD-10-CM

## 2022-05-29 LAB — BLADDER SCAN AMB NON-IMAGING

## 2022-05-29 MED ORDER — MIRABEGRON ER 50 MG PO TB24: 50.0000 mg | ORAL_TABLET | Freq: Every day | ORAL | 3 refills | Status: AC

## 2022-05-29 NOTE — Progress Notes (Signed)
   05/29/2022 12:55 PM   Dion Sibal 07/18/1946 [id number]  Reason for visit: Follow up history of metastatic RCC, OAB  HPI: I saw Mr. Tony Huffman and his wife back in clinic today for the above issues.  He has a very interesting and complex urologic history.  Reportedly was diagnosed with stage IV kidney cancer with a large 9 cm left renal mass and left adrenal mass and underwent left nephrectomy and adrenalectomy in 2011 in [place].  He was followed by hematology and oncology in [place] at [facility], and developed a recurrence in the right adrenal gland in 2011 which ultimately underwent surgical resection, and was also treated with pazopanib.  His care was then transferred to [facility] and felt to have recurrence in the lungs based on the PET scan, as well as in the pancreas with an enlarging pancreatic mass.  It sounds like he actually moved to hospice care, but continued to feel well and never had any symptoms or weight loss.  He lost faith in healthcare system after all of this, and has not been seen by any medical provider since 2020.  The most recent imaging was from April 2020 that showed an enhancing and enlarging mass at the pancreatic head measuring 3 cm, normal right kidney, adrenal gland surgically absent bilaterally, mildly enlarged heterogenous prostate, and slight increase in bilateral pulmonary nodules.  I saw him on 04/24/2022 primarily for overactive bladder symptoms with urgency and frequency during the day, occasional urge incontinence, nocturia 0-2 times overnight.  He is compliant with CPAP machine.  He tried 1 dose of Flomax and felt lightheaded and discontinued that medication and was referred to urology.  PVR was normal at 16 mL and urinalysis was benign at that visit.  We opted for a trial of Myrbetriq 50 mg daily, and he has done extremely well on that medication.  He denies any urge incontinence and frequency has significantly improved, not having any nocturia most nights.  Bladder scan today 199  mL, but voided a few hours ago and denies urge to void.  I recommended continuing the Myrbetriq, and this was refilled.  Regarding his history of reported metastatic RCC with pancreatic lesion, he actually discussed with his PCP recently and they opted for a repeat CT scan which has not yet been scheduled.  I will follow that up in the next few weeks to better evaluate his history of metastatic RCC, prostate size/volume and bladder.  Myrbetriq refilled We will follow-up CT ordered by PCP for history of reported metastatic RCC RTC 1 year symptom check  Billey Co, MD  [facility] [address], [place], [place] [address] [phone]

## 2022-05-31 ENCOUNTER — Other Ambulatory Visit: Payer: Self-pay | Admitting: Family Medicine

## 2022-05-31 DIAGNOSIS — R1900 Intra-abdominal and pelvic swelling, mass and lump, unspecified site: Secondary | ICD-10-CM

## 2022-06-01 ENCOUNTER — Ambulatory Visit: Payer: Medicare Other | Attending: Neurology

## 2022-06-01 DIAGNOSIS — R262 Difficulty in walking, not elsewhere classified: Secondary | ICD-10-CM | POA: Diagnosis present

## 2022-06-01 DIAGNOSIS — R2681 Unsteadiness on feet: Secondary | ICD-10-CM | POA: Insufficient documentation

## 2022-06-01 DIAGNOSIS — R296 Repeated falls: Secondary | ICD-10-CM | POA: Diagnosis present

## 2022-06-01 DIAGNOSIS — M6281 Muscle weakness (generalized): Secondary | ICD-10-CM | POA: Diagnosis present

## 2022-06-01 DIAGNOSIS — R278 Other lack of coordination: Secondary | ICD-10-CM | POA: Insufficient documentation

## 2022-06-01 DIAGNOSIS — R269 Unspecified abnormalities of gait and mobility: Secondary | ICD-10-CM | POA: Insufficient documentation

## 2022-06-01 DIAGNOSIS — R2689 Other abnormalities of gait and mobility: Secondary | ICD-10-CM | POA: Insufficient documentation

## 2022-06-01 NOTE — Therapy (Signed)
OUTPATIENT PHYSICAL THERAPY TREATMENT NOTE  Patient Name: Tony Huffman MRN: [id number] DOB:10-24-1945, 76 y.o., male Today's Date: 06/01/2022  PCP: Sherrie Mustache, NP REFERRING PROVIDER: Dr. Jennings Books   PT End of Session - 06/01/22 0850     Visit Number 88    Number of Visits 21    Date for PT Re-Evaluation 06/22/22    Authorization Type Medicare Part A & B; BCBS Secondary    Authorization Time Period 03/30/22-06/22/22    Progress Note Due on Visit 57    PT Start Time 0842    PT Stop Time 0928    PT Time Calculation (min) 46 min    Equipment Utilized During Treatment Gait belt    Activity Tolerance Patient tolerated treatment well;No increased pain    Behavior During Therapy [facility] for tasks assessed/performed               Past Medical History:  Diagnosis Date   Adrenal insufficiency ([place])    Anxiety    Chronic kidney disease, stage 3 ([place]) 11/13/2011   Erectile dysfunction    GERD (gastroesophageal reflux disease)    Gout 09/04/2013   H/O atrial flutter 03/07/2017   Hyperlipidemia 06/16/2014   Hypertension    Hypothyroidism (acquired) 04/14/2014   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[place]) 11/10/2010   OSA (obstructive sleep apnea) 07/24/2016   Pancreatic mass 08/01/2018   Renal carcinoma ([place]) 06/16/2015   RLS (restless legs syndrome) 07/24/2016   Sleep apnea    Testicular hypofunction    Tussive syncopes 10/15/2013   Type 2 diabetes mellitus ([place])    Vitamin D deficiency disease    Past Surgical History:  Procedure Laterality Date   ADRENALECTOMY     CATARACT EXTRACTION  10/2014   CATARACT EXTRACTION EXTRACAPSULAR  11/05/2016   with Insertion Intraocular Prostheis.    COLONOSCOPY  05/06/2017   with removal lesions by snare.   CYSTECTOMY     Upper Neck/Chest Area, [facility]   NEPHRECTOMY     POPLITEAL SYNOVIAL CYST EXCISION     SPINE SURGERY  1989   THYROIDECTOMY     TONSILLECTOMY     Patient Active Problem List   Diagnosis Date Noted    Advance care planning 05/27/2022   Gait abnormality 05/27/2022   Diabetes mellitus without complication ([place]) 42/59/5638   Addisons disease ([place]) 03/16/2021   Low HDL (under 40) 03/16/2021   Numbness and tingling 11/21/2020   Pancreatic mass 08/01/2018   H/O sebaceous cyst 01/06/2018   Edema leg 03/07/2017   OSA (obstructive sleep apnea) 07/24/2016   RLS (restless legs syndrome) 07/24/2016   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   Atrial fibrillation and flutter ([place]) 10/20/2014   Hyperlipidemia 06/16/2014   Cough syncope 10/15/2013   Dyslipidemia 09/04/2013   Gout 09/04/2013   Chronic kidney disease, stage III (moderate) ([place]) 11/13/2011   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   Hypothyroidism (acquired) 11/30/2010   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[place]) 11/10/2010    REFERRING DIAG: Unspecified abnormalities of gait and mobility  THERAPY DIAG:  Abnormality of gait and mobility  Difficulty in walking, not elsewhere classified  Muscle weakness (generalized)  Other abnormalities of gait and mobility  Other lack of coordination  Repeated falls  Unsteadiness on feet  Rationale for Evaluation and Treatment Rehabilitation  PERTINENT HISTORY: Patient is a 76 year old male with referral from Neurology- Dr. Joselyn Arrow for unspecified abnormality of gait. He presents today with his wife and reports > 6 month  history of progressive issues with poor balance and multiple falls. Patient has renal Cancer and currently has Pallative care- Was stabilized out of Hospice. Patient presents with the following past medical history: A-Fib and flutter, Lung Cancer, Obstructive sleep apnea, Upper respiratory infection, Addison's disease, Hypothyroidism, Renal Cancer, DM, gout, arthritis, Chronic kidney disease and Restless leg syndrome. Patient lives with wife in independent living at [facility]. Prior level of function was independent with transfers and mobility using walker  yet some assist with dressing.   PRECAUTIONS: falls  SUBJECTIVE:   Patient reports having a good week so far- Denies any falls. Reports has good moments and not so good moments  PAIN:  Are you having pain? No     TODAY'S TREATMENT: 05/18/22    Therex:   Step tap onto 1st step (at stairs with BUE Support) using 4# AW Step tap onto 2nd step Sit to stand x 10 reps x 2 sets (Patient reports fatigue on last few reps of 2nd set) LAQ 4#AW x 10 reps  Seated arm swings with 2kg ball - start in lumbar flexed with arms holding ball near floor- raise arms and ext back and raise ball overhead - 2 sets of 10 reps. Ambulation overground - 4# AW x 70 feet x 2 trials- Short reciprocal steps (Forward flexed posture and shuffling at times)    Alternating Hip swings with 4# AW - forward/backward alt 5 reps x 4 sets for total of 20 reps each Side   Ambulation overground -4# AW x 90 feet x 2 trials -    Donkey kicks with 4#AW 2 sets x 10 reps    Ambulation overground- 4# AW x 90 feet x 1 trial- much improved step length and no unsteadiness     PATIENT EDUCATION: Education details: exercise technique.  Person educated: Patient Education method: Explanation, Demonstration, Tactile cues, and Verbal cues Education comprehension: verbalized understanding, returned demonstration, verbal cues required, tactile cues required, and needs further education   HOME EXERCISE PROGRAM:  Access Code: [email or web address] URL: [email or web address] Date: 05/04/2022 Prepared by: Maureen Ralphs  Exercises - Sidelying Thoracic Rotation with Open Book  - 1 x daily - 3 sets - 10 reps - Supine Lower Trunk Rotation  - 1 x daily - 3 sets - 10 reps - Quadruped Full Range Thoracic Rotation with Reach  - 1 x daily - 3 sets - 10 reps    PT Short Term Goals -       PT SHORT TERM GOAL #1   Title Pt will be independent with HEP in order to improve strength and balance in order to decrease fall risk and improve  function at home and work.    Baseline 07/21/2021- Paitent presents with no formal HEP in place. 08/21/2021- Patient reports compliance with HEP and states no questions as of today- able to perform standin using his rollator for support.    Time 6    Period Weeks    Status Achieved    Target Date 09/01/21              PT Long Term Goals -      PT LONG TERM GOAL #1   Title Pt will improve FOTO to target score of 53  to display perceived improvements in ability to complete ADL's.    Baseline 07/21/2021= 45; 1/5=51. 10/12/2021- Just reassessed for progress note so did not reassess again- will continue with goal into new certification 2/10: 49%    Time 12  Period Weeks    Status Achieved    Target Date 03/30/22      PT LONG TERM GOAL #2   Title Pt will improve BERG by at least 5 points in order to demonstrate clinically significant improvement in balance.    Baseline 07/21/2021= 39/56; 08/21/2021=46/56- Will keep goal active to ensure patient able to test consistently; 10/05/2021= 52/56    Time 12    Period Weeks    Status Achieved    Target Date 10/13/21      PT LONG TERM GOAL #3   Title Pt will decrease 5TSTS by at least 5 seconds in order to demonstrate clinically significant improvement in LE strength.    Baseline 07/21/2021= 21 sec without UE Support; 11/21= 14.0 sec without UE support-.Will Keep goal active to ensure consistency.  10/05/2021=14.75 sec without UE support  (improved from 21 sec and unchanged from last assessment of 14 sec)    Time 12    Period Weeks    Status Achieved    Target Date 10/13/21      PT LONG TERM GOAL #4   Title Pt will decrease TUG to below  18 seconds/decrease in order to demonstrate decreased fall risk.    Baseline 07/21/2021= 23 sec with 4WW; 08/21/2021= 22.13 sec with 4WW; 10/05/2021= 19.51 sec avg with 4WW 2/10: 15.4 seconds with cane  12/14/2021= 18.0 sec    Time 12    Period Weeks    Status Achieved    Target Date 01/05/22      PT LONG  TERM GOAL #5   Title Pt will increase 10MWT by at least 0.23 m/s in order to demonstrate clinically significant improvement in community ambulation.    Baseline 07/21/2021= 0.53m/s; 08/21/2021= 0.57 m/s using 4WW; 10/05/2021= 0.6 m/s avg using 4WW 2/10: 0.51 m/s with cane. 12/14/2021= 0.65 m/s using SPC ; 04/20/22: .599 m/s SPC   Time 12    Period Weeks    Status Partially Met    Target Date 03/30/22      PT LONG TERM GOAL #6   Title Patient will increase six minute walk test distance to > 100 for progression to community ambulator and improve gait ability    Baseline 01/05/2022= 495 feet using SPC; 01/29/2022= 470 feet in 4 min 30 sec using SPC ; 04/20/2022: 478'   Time 12    Period Weeks    Status On-going    Target Date 03/30/22              Plan - 03/23/22 0855     Clinical Impression Statement Continued with plan of care focusing on safety with mobility and LE strengthening. Patient performed well overall with resistance today and able to walk several rounds with 4# weights with shorter step length and then once weight removed He demo improved step length and quality. Pt will continue to benefit from skilled PT services to improve risk for falls via postural and gait stability and strengthening.     Personal Factors and Comorbidities Comorbidity 3+    Comorbidities A- Fib, Renal cancer, Addison's disease, gout, arthritis, DM    Examination-Activity Limitations Caring for Others;Carry;Dressing;Lift;Reach Overhead;Squat;Stairs;Transfers;Stand    Examination-Participation Restrictions Investment banker, operational;Yard Work;Shop    Stability/Clinical Decision Making Evolving/Moderate complexity    Rehab Potential Good    PT Frequency 1x / week    PT Duration 12 weeks    PT Treatment/Interventions ADLs/Self Care Home Management;Canalith Repostioning;Cryotherapy;Electrical Stimulation;Moist Heat;Traction;Ultrasound;DME Instruction;Gait training;Stair training;Functional mobility  training;Therapeutic activities;Therapeutic exercise;Balance training;Neuromuscular re-education;Patient/family  education;Orthotic Fit/Training;Wheelchair mobility training;Manual techniques;Passive range of motion;Dry needling;Energy conservation;Taping;Vestibular    PT Next Visit Plan Reassess gait speed ans 6MWT if symptoms improved. Please incorporate postural strengthening due to anterior trunk lean with gait.    PT Home Exercise Plan 08/17/2021-Access Code: [email or web address]; no updates    Consulted and Agree with Plan of Care Patient;Family member/caregiver    Family Member Consulted WIfe- [name]             Ollen Bowl, PT Physical Therapist- [facility]  06/01/2022, 10:04 AM

## 2022-06-06 ENCOUNTER — Encounter: Payer: Self-pay | Admitting: Podiatry

## 2022-06-08 ENCOUNTER — Ambulatory Visit: Payer: Medicare Other

## 2022-06-11 ENCOUNTER — Ambulatory Visit
Admission: RE | Admit: 2022-06-11 | Discharge: 2022-06-11 | Disposition: A | Discharge status: Home / Self Care | Payer: Medicare Other | Source: Ambulatory Visit | Attending: Family Medicine | Admitting: Family Medicine

## 2022-06-11 DIAGNOSIS — R1900 Intra-abdominal and pelvic swelling, mass and lump, unspecified site: Secondary | ICD-10-CM | POA: Diagnosis present

## 2022-06-11 LAB — POCT I-STAT CREATININE: Creatinine, Ser: 1.1 mg/dL (ref 0.61–1.24)

## 2022-06-11 MED ORDER — IOHEXOL 300 MG/ML  SOLN
100.0000 mL | Freq: Once | INTRAMUSCULAR | Status: AC | PRN
  Administered 2022-06-11: 100 mL via INTRAVENOUS

## 2022-06-13 ENCOUNTER — Telehealth: Payer: Self-pay | Admitting: Family Medicine

## 2022-06-13 DIAGNOSIS — C7490 Malignant neoplasm of unspecified part of unspecified adrenal gland: Secondary | ICD-10-CM

## 2022-06-13 DIAGNOSIS — K8689 Other specified diseases of pancreas: Secondary | ICD-10-CM

## 2022-06-13 NOTE — Telephone Encounter (Signed)
Referral placed for oncology.  Please check with radiology.  I need input about his recent CT re: the adrenal lesion.  He had previous adrenalectomy bilaterally.  Patient had a question about this.  Is this a metastatic lesion in the area where his adrenal gland was previously located?  Please pull me if needed so I can talk with radiology MD.  Thanks.

## 2022-06-14 ENCOUNTER — Encounter: Payer: Self-pay | Admitting: Podiatry

## 2022-06-14 ENCOUNTER — Ambulatory Visit (INDEPENDENT_AMBULATORY_CARE_PROVIDER_SITE_OTHER): Payer: Medicare Other | Admitting: Podiatry

## 2022-06-14 DIAGNOSIS — R252 Cramp and spasm: Secondary | ICD-10-CM

## 2022-06-14 DIAGNOSIS — M778 Other enthesopathies, not elsewhere classified: Secondary | ICD-10-CM

## 2022-06-14 MED ORDER — CYCLOBENZAPRINE HCL 10 MG PO TABS: 10.0000 mg | ORAL_TABLET | Freq: Three times a day (TID) | ORAL | 0 refills | Status: DC | PRN

## 2022-06-14 NOTE — Progress Notes (Signed)
Subjective:  Patient ID: Tony Huffman, male    DOB: [birth year],  MRN: [id number]  Chief Complaint  Patient presents with   Foot Pain    76 y.o. male presents with the above complaint.  Patient presents with right lateral foot pain.  Patient states he also has underlying cramping.  The cramping led to the pain on the side of the foot.  He states it came out of nowhere has been hurting hurts with ambulation he would like to discuss treatment options for it he has not seen anyone else prior to seeing me he denies any other acute complaints.   Review of Systems: Negative except as noted in the HPI. Denies N/V/F/Ch.  Past Medical History:  Diagnosis Date   Adrenal insufficiency ([facility])    Anxiety    Chronic kidney disease, stage 3 ([facility]) 11/13/2011   Erectile dysfunction    GERD (gastroesophageal reflux disease)    Gout 09/04/2013   H/O atrial flutter 03/07/2017   Hyperlipidemia 06/16/2014   Hypertension    Hypothyroidism (acquired) 04/14/2014   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[place]) 11/10/2010   OSA (obstructive sleep apnea) 07/24/2016   Pancreatic mass 08/01/2018   Renal carcinoma ([place]) 06/16/2015   RLS (restless legs syndrome) 07/24/2016   Sleep apnea    Testicular hypofunction    Tussive syncopes 10/15/2013   Type 2 diabetes mellitus ([facility])    Vitamin D deficiency disease     Current Outpatient Medications:    cyclobenzaprine (FLEXERIL) 10 MG tablet, Take 1 tablet (10 mg total) by mouth 3 (three) times daily as needed for muscle spasms., Disp: 30 tablet, Rfl: 0   Accu-Chek Softclix Lancets lancets, Use to check blood sugar once daily. Dx: E11.49, Disp: 100 each, Rfl: 3   blood glucose meter kit and supplies, 1 each by Other route as directed. Dispense based on patient and insurance preference. Use up to four times daily as directed. (FOR ICD-10 E10.9, E11.9)., Disp: , Rfl:    Cholecalciferol 125 MCG (5000 UT) capsule, Take 5,000 Units by mouth daily., Disp: , Rfl:     colchicine 0.6 MG tablet, Take 1 tablet (0.6 mg total) by mouth as needed. First sign of Gout Flare. Take 2 tablets ( 1.615m) by mouth at first sign of gout flare followed by 1 tablet ( 0.672m after 1 hour. ( max 1.15m46mithin 1 hours), Disp: 180 tablet, Rfl: 1   cyanocobalamin 1000 MCG tablet, Take 1,000 mcg by mouth every other day., Disp: , Rfl:    Fludrocortisone Acetate (FLORINEF PO), Take 0.1 mg by mouth every other day. On alternate days take 1/2 tablet., Disp: , Rfl:    furosemide (LASIX) 20 MG tablet, Take 20 mg by mouth as needed. For Edema., Disp: , Rfl:    gabapentin (NEURONTIN) 600 MG tablet, Take 900 mg by mouth daily. Takes 1 and 1/2 tablet by mouth nightly., Disp: , Rfl:    glucose blood (ACCU-CHEK AVIVA PLUS) test strip, Use to test blood sugar once daily. Dx: E11.49, Disp: 100 each, Rfl: 3   hydrocortisone (CORTEF) 10 MG tablet, Take by mouth daily. Takes 1 and 1/2 tablet every morning, 1/2 tablet every afternoon, and 1/2 tablet every evening. Take double dose as directed for stress, may take up to 85 tablets monthly., Disp: , Rfl:    levothyroxine (SYNTHROID) 75 MCG tablet, Take 75 mcg by mouth daily., Disp: , Rfl:    metFORMIN (GLUMETZA) 500 MG (MOD) 24 hr tablet, Take 500 mg by mouth daily., Disp: ,  Rfl:    mirabegron ER (MYRBETRIQ) 50 MG TB24 tablet, Take 1 tablet (50 mg total) by mouth daily., Disp: 90 tablet, Rfl: 3   Multiple Vitamins-Minerals (PRESERVISION AREDS 2 PO), Take 1 capsule by mouth in the morning and at bedtime., Disp: , Rfl:    PSYLLIUM HUSK PO, Take 3.4 g by mouth every evening., Disp: , Rfl:   Social History   Tobacco Use  Smoking Status Former   Packs/day: 1.00   Years: 10.00   Total pack years: 10.00   Types: Cigarettes   Quit date: 1978   Years since quitting: 45.7   Passive exposure: Past  Smokeless Tobacco Never    Allergies  Allergen Reactions   Other Rash    Blisters with bandaids Blisters with bandaids    Tegaderm Ag Mesh [Silver]     Tape Rash    Blisters, rash. Paper tape OK. Blisters, rash. Paper tape OK.    Objective:  There were no vitals filed for this visit. There is no height or weight on file to calculate BMI. Constitutional Well developed. Well nourished.  Vascular Dorsalis pedis pulses palpable bilaterally. Posterior tibial pulses palpable bilaterally. Capillary refill normal to all digits.  No cyanosis or clubbing noted. Pedal hair growth normal.  Neurologic Normal speech. Oriented to person, place, and time. Epicritic sensation to light touch grossly present bilaterally.  Dermatologic Nails well groomed and normal in appearance. No open wounds. No skin lesions.  Orthopedic: Pain on palpation to the right lateral midfoot no pain at the extensor or flexor tendinitis no pain at the Lisfranc interval.  Midfoot arthritic changes clinically appreciated.  Generalized muscle cramping noted with not able to recreate in clinic   Radiographs: None Assessment:   1. Capsulitis of right foot   2. Muscle cramp    Plan:  Patient was evaluated and treated and all questions answered.  Right lateral midfoot capsulitis -All questions and concerns were discussed with the patient in extensive detail given the amount of pain that he is having he will benefit from a steroid injection of decrease acute inflammatory component associate with pain.  Patient agrees with plan like to proceed with steroid injection -A steroid injection was performed at right dorsal midfoot using 1% plain Lidocaine and 10 mg of Kenalog. This was well tolerated.  Muscle cramp -I explained to patient the etiology of muscle cramp he has good circulation and therefore is not circulation related I discussed with him that he could benefit from muscle relaxer.  Flexeril was sent to the pharmacy.  No follow-ups on file.

## 2022-06-14 NOTE — Telephone Encounter (Signed)
Spoke with radiology and it was confirmed that the mass that was seen on left side is a new/recurrent mass.

## 2022-06-14 NOTE — Telephone Encounter (Signed)
Please call pt.  I called but couldn't get through on patient's phone or wife's phone.  Please let him know that radiology confirmed that the mass that was seen on left side is a new/recurrent mass. I still think it makes sense to follow up with oncology as planned and he should get a call about an appointment soon.  If he doesn't get a call about that soon, then let me know.  Thanks.

## 2022-06-15 ENCOUNTER — Ambulatory Visit: Payer: Medicare Other

## 2022-06-15 DIAGNOSIS — R296 Repeated falls: Secondary | ICD-10-CM

## 2022-06-15 DIAGNOSIS — R2689 Other abnormalities of gait and mobility: Secondary | ICD-10-CM

## 2022-06-15 DIAGNOSIS — M6281 Muscle weakness (generalized): Secondary | ICD-10-CM

## 2022-06-15 DIAGNOSIS — R2681 Unsteadiness on feet: Secondary | ICD-10-CM

## 2022-06-15 DIAGNOSIS — R269 Unspecified abnormalities of gait and mobility: Secondary | ICD-10-CM | POA: Diagnosis not present

## 2022-06-15 DIAGNOSIS — R278 Other lack of coordination: Secondary | ICD-10-CM

## 2022-06-15 DIAGNOSIS — R262 Difficulty in walking, not elsewhere classified: Secondary | ICD-10-CM

## 2022-06-15 NOTE — Telephone Encounter (Signed)
I have tried twice today to call patient; but both times I do it says call can not be completed.

## 2022-06-15 NOTE — Therapy (Signed)
OUTPATIENT PHYSICAL THERAPY TREATMENT NOTE  Patient Name: Tony Huffman MRN: [id number] DOB:13-Dec-1945, 76 y.o., male Today's Date: 06/15/2022  PCP: Sherrie Mustache, NP REFERRING PROVIDER: Dr. Jennings Books   PT End of Session - 06/15/22 0849     Visit Number 52    Number of Visits 36    Date for PT Re-Evaluation 06/22/22    Authorization Type Medicare Part A & B; BCBS Secondary    Authorization Time Period 03/30/22-06/22/22    Progress Note Due on Visit 12    PT Start Time 0840    Equipment Utilized During Treatment Gait belt    Activity Tolerance Patient tolerated treatment well;No increased pain    Behavior During Therapy [facility] for tasks assessed/performed               Past Medical History:  Diagnosis Date   Adrenal insufficiency ([place])    Anxiety    Chronic kidney disease, stage 3 ([place]) 11/13/2011   Erectile dysfunction    GERD (gastroesophageal reflux disease)    Gout 09/04/2013   H/O atrial flutter 03/07/2017   Hyperlipidemia 06/16/2014   Hypertension    Hypothyroidism (acquired) 04/14/2014   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[place]) 11/10/2010   OSA (obstructive sleep apnea) 07/24/2016   Pancreatic mass 08/01/2018   Renal carcinoma ([place]) 06/16/2015   RLS (restless legs syndrome) 07/24/2016   Sleep apnea    Testicular hypofunction    Tussive syncopes 10/15/2013   Type 2 diabetes mellitus ([place])    Vitamin D deficiency disease    Past Surgical History:  Procedure Laterality Date   ADRENALECTOMY     CATARACT EXTRACTION  10/2014   CATARACT EXTRACTION EXTRACAPSULAR  11/05/2016   with Insertion Intraocular Prostheis.    COLONOSCOPY  05/06/2017   with removal lesions by snare.   CYSTECTOMY     Upper Neck/Chest Area, [facility]   NEPHRECTOMY     POPLITEAL SYNOVIAL CYST EXCISION     SPINE SURGERY  1989   THYROIDECTOMY     TONSILLECTOMY     Patient Active Problem List   Diagnosis Date Noted   Advance care planning 05/27/2022   Gait abnormality  05/27/2022   Diabetes mellitus without complication ([place]) 61/60/7371   Addisons disease ([place]) 03/16/2021   Low HDL (under 40) 03/16/2021   Numbness and tingling 11/21/2020   Pancreatic mass 08/01/2018   H/O sebaceous cyst 01/06/2018   Edema leg 03/07/2017   OSA (obstructive sleep apnea) 07/24/2016   RLS (restless legs syndrome) 07/24/2016   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   Atrial fibrillation and flutter ([place]) 10/20/2014   Hyperlipidemia 06/16/2014   Cough syncope 10/15/2013   Dyslipidemia 09/04/2013   Gout 09/04/2013   Chronic kidney disease, stage III (moderate) ([place]) 11/13/2011   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   Hypothyroidism (acquired) 11/30/2010   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 Joselyn Arrow for unspecified abnormality of gait. He presents today with his wife and reports > 6 month history of progressive issues with poor balance and multiple falls. Patient has renal Cancer and currently  has Pallative care- Was stabilized out of Hospice. Patient presents with the following past medical history: A-Fib and flutter, Lung Cancer, Obstructive sleep apnea, Upper respiratory infection, Addison's disease, Hypothyroidism, Renal Cancer, DM, gout, arthritis, Chronic kidney disease and Restless leg syndrome. Patient lives with wife in independent living at [facility]. Prior level of function was independent with transfers and mobility using walker yet some assist with dressing.   PRECAUTIONS:  falls  SUBJECTIVE:   Patient reports having some past cramping pain in lateral aspect of right foot but no pain today.  PAIN:  Are you having pain? No     TODAY'S TREATMENT: 05/18/22    Therex:   Nustep for LE strength at increased resistance today:   L4- 1 min   L5- 1 min   L6- 1 min   L7- 1 min   L8- 1 min    Leg press: Precor   25 #- Left LE x 12; Right LE x 12 reps   40#- Right LE x 12 reps; Left LE x 12 reps   55#- Right LE x 10 reps; Left LE x 10 reps   55#- Right LE x 10 reps; Left LE x 10    Started calf raises at 40 # x 10 each LE yet patient informed that he just had a foot injection yesterday so terminated    Hip circles Standing - CW/CCW around yoga block x 10 each direction - each LE Step up onto 6" step + green therapad on top with contralateral Hip march - LE only x 15 reps LAQ 5#AW x 10 reps x 2 sets   Seated hip flex with 5# AW Alt LE x 10 reps x 2 sets   PATIENT EDUCATION: Education details: exercise technique.  Person educated: Patient Education method: Explanation, Demonstration, Tactile cues, and Verbal cues Education comprehension: verbalized understanding, returned demonstration, verbal cues required, tactile cues required, and needs further education   HOME EXERCISE PROGRAM:  Access Code: [email or web address] URL: [email or web address] Date: 05/04/2022 Prepared by: Sande Brothers  Exercises - Sidelying Thoracic Rotation with Open Book  - 1 x daily - 3 sets - 10 reps - Supine Lower Trunk Rotation  - 1 x daily - 3 sets - 10 reps - Quadruped Full Range Thoracic Rotation with Reach  - 1 x daily - 3 sets - 10 reps    PT Short Term Goals -       PT SHORT TERM GOAL #1   Title Pt will be independent with HEP in order to improve strength and balance in order to decrease fall risk and improve function at home and work.    Baseline 07/21/2021- Paitent presents with no formal HEP in place. 08/21/2021- Patient reports compliance with HEP and  states no questions as of today- able to perform standin using his rollator for support.    Time 6    Period Weeks    Status Achieved    Target Date 09/01/21              PT Long Term Goals -      PT LONG TERM GOAL #1   Title Pt will improve FOTO to target score of 53  to display perceived improvements in ability to complete ADL's.    Baseline 07/21/2021= 45; 1/5=51. 10/12/2021- Just reassessed for progress note so did not reassess again- will continue with goal into new certification 5/17: 00%    Time 12    Period Weeks    Status Achieved  Target Date 03/30/22      PT LONG TERM GOAL #2   Title Pt will improve BERG by at least 5 points in order to demonstrate clinically significant improvement in balance.    Baseline 07/21/2021= 39/56; 08/21/2021=46/56- Will keep goal active to ensure patient able to test consistently; 10/05/2021= 52/56    Time 12    Period Weeks    Status Achieved    Target Date 10/13/21      PT LONG TERM GOAL #3   Title Pt will decrease 5TSTS by at least 5 seconds in order to demonstrate clinically significant improvement in LE strength.    Baseline 07/21/2021= 21 sec without UE Support; 11/21= 14.0 sec without UE support-.Will Keep goal active to ensure consistency.  10/05/2021=14.75 sec without UE support  (improved from 21 sec and unchanged from last assessment of 14 sec)    Time 12    Period Weeks    Status Achieved    Target Date 10/13/21      PT LONG TERM GOAL #4   Title Pt will decrease TUG to below  18 seconds/decrease in order to demonstrate decreased fall risk.    Baseline 07/21/2021= 23 sec with 4WW; 08/21/2021= 22.13 sec with 4WW; 10/05/2021= 19.51 sec avg with 4WW 2/10: 15.4 seconds with cane  12/14/2021= 18.0 sec    Time 12    Period Weeks    Status Achieved    Target Date 01/05/22      PT LONG TERM GOAL #5   Title Pt will increase 10MWT by at least 0.23 m/s in order to demonstrate clinically significant improvement in community ambulation.     Baseline 07/21/2021= 0.79ms; 08/21/2021= 0.57 m/s using 4WW; 10/05/2021= 0.6 m/s avg using 4WW 2/10: 0.51 m/s with cane. 12/14/2021= 0.65 m/s using SPC ; 04/20/22: .599 m/s SPC   Time 12    Period Weeks    Status Partially Met    Target Date 03/30/22      PT LONG TERM GOAL #6   Title Patient will increase six minute walk test distance to > 100 for progression to community ambulator and improve gait ability    Baseline 01/05/2022= 495 feet using SPC; 01/29/2022= 470 feet in 4 min 30 sec using SPC ; 04/20/2022: 478'   Time 12    Period Weeks    Status On-going    Target Date 03/30/22              Plan - 03/23/22 0855     Clinical Impression Statement Continued with plan of care focusing on left LE strengthening. Patient demo quicker fatigue with LE as well as impaired coordination as observed during leg press and later hip circles. He experienced increased difficulty circling hip on left bumping into yoga block several times. Pt will continue to benefit from skilled PT services to improve risk for falls via postural and gait stability and strengthening.     Personal Factors and Comorbidities Comorbidity 3+    Comorbidities A- Fib, Renal cancer, Addison's disease, gout, arthritis, DM    Examination-Activity Limitations Caring for Others;Carry;Dressing;Lift;Reach Overhead;Squat;Stairs;Transfers;Stand    Examination-Participation Restrictions CInvestment banker, operationalYard Work;Shop    Stability/Clinical Decision Making Evolving/Moderate complexity    Rehab Potential Good    PT Frequency 1x / week    PT Duration 12 weeks    PT Treatment/Interventions ADLs/Self Care Home Management;Canalith Repostioning;Cryotherapy;Electrical Stimulation;Moist Heat;Traction;Ultrasound;DME Instruction;Gait training;Stair training;Functional mobility training;Therapeutic activities;Therapeutic exercise;Balance training;Neuromuscular re-education;Patient/family education;Orthotic Fit/Training;Wheelchair  mobility training;Manual techniques;Passive range of motion;Dry needling;Energy conservation;Taping;Vestibular  PT Next Visit Plan Reassess gait speed ans 6MWT if symptoms improved. Please incorporate postural strengthening due to anterior trunk lean with gait.    PT Home Exercise Plan 08/17/2021-Access Code: [email or web address]; no updates    Consulted and Agree with Plan of Care Patient;Family member/caregiver    Family Member Consulted WIfe- [name]             Ollen Bowl, PT Physical Therapist- [facility]  06/15/2022, 8:50 AM

## 2022-06-18 NOTE — Telephone Encounter (Signed)
Spoke with patient about his results and he already has appt on 06/19/22 @ 9:30 am.

## 2022-06-18 NOTE — Telephone Encounter (Signed)
Tried patients number again but still says can not connect call with dialed. I called patients wifes number and LMTCB.

## 2022-06-19 ENCOUNTER — Encounter: Payer: Self-pay | Admitting: Oncology

## 2022-06-19 ENCOUNTER — Inpatient Hospital Stay: Payer: Medicare Other | Attending: Oncology | Admitting: Oncology

## 2022-06-19 ENCOUNTER — Inpatient Hospital Stay: Payer: Medicare Other

## 2022-06-19 VITALS — BP 116/77 | HR 66 | Resp 18 | Ht 68.0 in | Wt 211.0 lb

## 2022-06-19 DIAGNOSIS — K869 Disease of pancreas, unspecified: Secondary | ICD-10-CM

## 2022-06-19 DIAGNOSIS — I129 Hypertensive chronic kidney disease with stage 1 through stage 4 chronic kidney disease, or unspecified chronic kidney disease: Secondary | ICD-10-CM | POA: Diagnosis not present

## 2022-06-19 DIAGNOSIS — N183 Chronic kidney disease, stage 3 unspecified: Secondary | ICD-10-CM | POA: Insufficient documentation

## 2022-06-19 DIAGNOSIS — R978 Other abnormal tumor markers: Secondary | ICD-10-CM

## 2022-06-19 DIAGNOSIS — E2749 Other adrenocortical insufficiency: Secondary | ICD-10-CM | POA: Insufficient documentation

## 2022-06-19 DIAGNOSIS — C642 Malignant neoplasm of left kidney, except renal pelvis: Secondary | ICD-10-CM | POA: Diagnosis not present

## 2022-06-19 DIAGNOSIS — E559 Vitamin D deficiency, unspecified: Secondary | ICD-10-CM | POA: Insufficient documentation

## 2022-06-19 DIAGNOSIS — Z79890 Hormone replacement therapy (postmenopausal): Secondary | ICD-10-CM | POA: Diagnosis not present

## 2022-06-19 DIAGNOSIS — Z79899 Other long term (current) drug therapy: Secondary | ICD-10-CM | POA: Insufficient documentation

## 2022-06-19 DIAGNOSIS — Z7984 Long term (current) use of oral hypoglycemic drugs: Secondary | ICD-10-CM | POA: Diagnosis not present

## 2022-06-19 DIAGNOSIS — E785 Hyperlipidemia, unspecified: Secondary | ICD-10-CM | POA: Insufficient documentation

## 2022-06-19 DIAGNOSIS — Z85528 Personal history of other malignant neoplasm of kidney: Secondary | ICD-10-CM | POA: Insufficient documentation

## 2022-06-19 DIAGNOSIS — Z87891 Personal history of nicotine dependence: Secondary | ICD-10-CM | POA: Diagnosis not present

## 2022-06-19 DIAGNOSIS — M109 Gout, unspecified: Secondary | ICD-10-CM | POA: Diagnosis not present

## 2022-06-19 DIAGNOSIS — E039 Hypothyroidism, unspecified: Secondary | ICD-10-CM | POA: Diagnosis not present

## 2022-06-19 DIAGNOSIS — E1122 Type 2 diabetes mellitus with diabetic chronic kidney disease: Secondary | ICD-10-CM | POA: Insufficient documentation

## 2022-06-19 DIAGNOSIS — K219 Gastro-esophageal reflux disease without esophagitis: Secondary | ICD-10-CM | POA: Insufficient documentation

## 2022-06-19 DIAGNOSIS — K8689 Other specified diseases of pancreas: Secondary | ICD-10-CM

## 2022-06-19 DIAGNOSIS — Z905 Acquired absence of kidney: Secondary | ICD-10-CM | POA: Insufficient documentation

## 2022-06-19 DIAGNOSIS — G2581 Restless legs syndrome: Secondary | ICD-10-CM | POA: Insufficient documentation

## 2022-06-19 DIAGNOSIS — Z7189 Other specified counseling: Secondary | ICD-10-CM

## 2022-06-19 DIAGNOSIS — D378 Neoplasm of uncertain behavior of other specified digestive organs: Secondary | ICD-10-CM | POA: Diagnosis not present

## 2022-06-19 DIAGNOSIS — G4733 Obstructive sleep apnea (adult) (pediatric): Secondary | ICD-10-CM | POA: Insufficient documentation

## 2022-06-19 LAB — CBC WITH DIFFERENTIAL/PLATELET
Abs Immature Granulocytes: 0.08 10*3/uL — ABNORMAL HIGH (ref 0.00–0.07)
Basophils Absolute: 0 10*3/uL (ref 0.0–0.1)
Basophils Relative: 1 %
Eosinophils Absolute: 0.2 10*3/uL (ref 0.0–0.5)
Eosinophils Relative: 3 %
HCT: 41.4 % (ref 39.0–52.0)
Hemoglobin: 13.7 g/dL (ref 13.0–17.0)
Immature Granulocytes: 1 %
Lymphocytes Relative: 22 %
Lymphs Abs: 1.8 10*3/uL (ref 0.7–4.0)
MCH: 27.9 pg (ref 26.0–34.0)
MCHC: 33.1 g/dL (ref 30.0–36.0)
MCV: 84.3 fL (ref 80.0–100.0)
Monocytes Absolute: 0.5 10*3/uL (ref 0.1–1.0)
Monocytes Relative: 6 %
Neutro Abs: 5.4 10*3/uL (ref 1.7–7.7)
Neutrophils Relative %: 67 %
Platelets: 264 10*3/uL (ref 150–400)
RBC: 4.91 MIL/uL (ref 4.22–5.81)
RDW: 15.2 % (ref 11.5–15.5)
WBC: 8 10*3/uL (ref 4.0–10.5)
nRBC: 0 % (ref 0.0–0.2)

## 2022-06-19 LAB — COMPREHENSIVE METABOLIC PANEL
ALT: 22 U/L (ref 0–44)
AST: 25 U/L (ref 15–41)
Albumin: 3.9 g/dL (ref 3.5–5.0)
Alkaline Phosphatase: 82 U/L (ref 38–126)
Anion gap: 6 (ref 5–15)
BUN: 24 mg/dL — ABNORMAL HIGH (ref 8–23)
CO2: 25 mmol/L (ref 22–32)
Calcium: 8.8 mg/dL — ABNORMAL LOW (ref 8.9–10.3)
Chloride: 103 mmol/L (ref 98–111)
Creatinine, Ser: 0.94 mg/dL (ref 0.61–1.24)
GFR, Estimated: >60 mL/min (ref >60)
Glucose, Bld: 181 mg/dL — ABNORMAL HIGH (ref 70–99)
Potassium: 4.9 mmol/L (ref 3.5–5.1)
Sodium: 134 mmol/L — ABNORMAL LOW (ref 135–145)
Total Bilirubin: 0.6 mg/dL (ref 0.3–1.2)
Total Protein: 7 g/dL (ref 6.5–8.1)

## 2022-06-19 LAB — LACTATE DEHYDROGENASE: LDH: 93 U/L — ABNORMAL LOW (ref 98–192)

## 2022-06-19 NOTE — Assessment & Plan Note (Signed)
History of left kidney clear cell carcinoma, with adrenal metastatic disease s/p bilateral adrenalectomy.  Recent images were reviewed.  Recommend PET scan.  Patient is not interested in any treatment and biopsy.  Continue surveillance.

## 2022-06-19 NOTE — Progress Notes (Signed)
Hematology/Oncology Consult Note [phone] [phone]     REFERRING PROVIDER: Tonia Ghent, MD   Patient Care Team: Tonia Ghent, MD as PCP - General (Family Medicine) Gabriel Carina, Betsey Holiday, MD as Physician Assistant (Endocrinology) [name], MD as Referring Physician (Neurology) Clent Jacks, RN as Oncology Nurse Navigator  CHIEF COMPLAINTS/PURPOSE OF CONSULTATION:  Pancreatic mass  HISTORY OF PRESENTING ILLNESS:  Tony Huffman 76 y.o. male presents to establish care for pancreatic mass I have reviewed his chart and materials related to his cancer extensively and collaborated history with the patient. 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 The tumor was 7.5 cm without capsular extension. The left adrenal gland contained noncontiguous 0.8 cm focus of metastatic renal cell carcinoma, pathologic stage  pM1  -Following surgery, PET CT showed numerous small areas of enhancement including the right renal mass area and possibly the left first rib and tiny lung nodules that were negative. He was started on Votrient. After 2 months, the PET-avid was recognized as right-sided adrenal gland, not separate in space from the kidney measuring 1.9 x 3.3.  --09/15/10 right, adrenal, FNA with core biopsy-necrotic tumor consistent with chronic metastatic renal cell carcinoma. -11/10/10 right adrenalectomy-metastatic renal cell carcinoma, clear cell, grade 1-2, Fuhrman grade  4, 3.2 cm, no ECE, R 0.  -03/29/11 The very tiny pulmonary nodules, unclear the source of these nodule. He underwent bronchoscopic biopsy of these lesions with multiple passes, all consistent with benign disease    03/09/2015 Imaging   PET CT skull base to mid thigh done at [facility] 1. Bilateral small pulmonary nodules are new as compared with the remote PET/CT. None of the nodules demonstrate increased FDG uptake; however, the left lower lobe nodules have increased in size compared with the previous two chest CTs and remain suspicious for metastatic disease.  2. Nonenlarged AP window lymph node demonstrating relatively intense FDG uptake, nonspecific. While this may be reactive, metastatic disease cannot be excluded.    03/15/2016 Imaging   PET CT done at  [facility] 1. New left hilar metabolic lymphadenopathy, 16 mm node present, metastatic versus reactive. Previously hypermetabolic AP window node stable in size no longer hypermetabolic.  2. Multiple bilateral pulmonary nodules the vast majority of which are stable but too small to analyze with FDG PET scan. There is a 4 mm left lower lobe pulmonary nodule which is slightly larger than prior examination, uncertain but doubtful significance    10/29/2016 Imaging   PET CT done at [facility]      1. Persistent hypermetabolic aortopulmonary window and left hilar lymph node. This remains concerning for an indolent metastatic process.  2. New indeterminate 4 mm right lower lobe pulmonary nodule. This is below size threshold for accurate characterization with PET/CT. The remainder of the pulmonary nodules appear unchanged. Consider a short interval follow-up chest CT in 3 months.  3. The indeterminate T7 vertebral body lesion is unchanged.    11/05/2017 Imaging   PET CT done at [facility]  1. Increased size of intensely hypermetabolic left  hilar lymph node,concerning for metastasis of either a primary lung malignancy or of the renal cell carcinoma.  2. New and increased size of  multiple solid bilateral pulmonary nodules, which are not metabolically active, largest now measuring up to 1.6 cm (prior 1.2 cm). These nodules are suspicious for renal cell metastasis.  3. No evidence of local recurrence or metastatic disease within the abdomen and pelvis.    11/05/2017 Imaging   CT chest abdomen pelvis w contrast at [facility] 1.  Similar appearance of bilateral pulmonary nodules and left hilar mass/lymphadenopathy through February 2019.  2.  Hyperenhancing mass within the region the pancreatic head measuring up to 2.1 cm, concerning for metastatic lesion or less likely neuroendocrine tumor.     07/29/2018 Imaging   CT chest abdomen pelvis w contrast at [facility] 1.  Overall similar size and appearance of bilateral pulmonary metastasis and left hilar mass.  2.  Multiple hyperenhancing masses within the pancreas, the largest within the pancreatic head measuring 2.4 cm in greatest diameter, unchanged and which remain concerning for metastasis.  3.  Nonocclusive thrombus within the IMV just prior to the confluence of the splenic vein, appears slightly improved compared to prior examination.    01/27/2019 Imaging   CT done at [facility]  1.  Bilateral pulmonary nodules are similar to minimally increased and pancreatic head mass has increased size since prior study.  2.  Left hilar mass/lymphadenopathy appears similar to prior study. 3.  Intraluminal filling defect in the IMV appears similar to prior study, this may represent mixing artifact.     06/11/2022 Imaging   CT pancreas Abdomen w wo contrast showed 1. 4.7 x 3.3 cm enhancing mass involving the descending duodenum and region of the ampulla.  2. 2.8 x 2.7 cm left adrenal gland mass likely metastatic disease. 3. Bilateral lower lobe pulmonary nodules consistent with metastatic disease. 4. Status post right adrenalectomy and left nephrectomy. 5. 10 mm enhancing nodule in the gallbladder could be a gallbladder  polyp.      Patient denies  any unintentional weight loss, night sweating, fever, abdominal pain, diarrhea.  Accompanied by wife.    MEDICAL HISTORY:  Past Medical History:  Diagnosis Date   Adrenal insufficiency ([place])    Anxiety    Chronic kidney disease, stage 3 ([place]) 11/13/2011   Erectile dysfunction    GERD (gastroesophageal reflux disease)    Gout 09/04/2013   H/O atrial flutter 03/07/2017   Hyperlipidemia 06/16/2014   Hypertension    Hypothyroidism (acquired) 04/14/2014   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[place]) 11/10/2010   OSA (obstructive sleep apnea) 07/24/2016   Pancreatic mass 08/01/2018   Renal carcinoma ([place]) 06/16/2015   RLS (restless legs syndrome) 07/24/2016   Sleep apnea    Testicular hypofunction    Tussive syncopes 10/15/2013   Type 2 diabetes mellitus ([place])    Vitamin D deficiency disease     SURGICAL HISTORY: Past Surgical History:  Procedure Laterality Date   ADRENALECTOMY     CATARACT EXTRACTION  10/2014   CATARACT EXTRACTION EXTRACAPSULAR  11/05/2016   with Insertion Intraocular Prostheis.    COLONOSCOPY  05/06/2017   with removal lesions by snare.   CYSTECTOMY     Upper Neck/Chest Area, [facility]   NEPHRECTOMY     POPLITEAL SYNOVIAL CYST EXCISION     SPINE SURGERY  1989   THYROIDECTOMY     patient states they only removed half   TONSILLECTOMY      SOCIAL HISTORY: Social History  Socioeconomic History   Marital status: Married    Spouse name: Not on file   Number of children: Not on file   Years of education: Not on file   Highest education level: Not on file  Occupational History   Not on file  Tobacco Use   Smoking status: Former    Packs/day: 1.00    Years: 10.00    Total pack years: 10.00    Types: Cigarettes    Quit date: 61    Years since quitting: 45.7    Passive exposure: Past   Smokeless tobacco: Never  Vaping Use   Vaping Use: Never used  Substance and Sexual Activity   Alcohol use: Not Currently   Drug use: Never    Sexual activity: Not Currently  Other Topics Concern   Not on file  Social History Narrative   Married 2002.   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   Lives at [facility].   Social Determinants of Health   Financial Resource Strain: Not on file  Food Insecurity: Not on file  Transportation Needs: Not on file  Physical Activity: Not on file  Stress: Not on file  Social Connections: Not on file  Intimate Partner Violence: Not on file    FAMILY HISTORY: Family History  Problem Relation Age of Onset   Ovarian cancer Mother    Cancer Mother    Cancer Father    Heart disease Father    Coronary artery disease Father    Hypertension Father    Macular degeneration Father    Heart disease Brother    Diabetes Brother    Coronary artery disease Brother    Diabetes type II Brother    Coronary artery disease Paternal Grandfather     ALLERGIES:  is allergic to other, tegaderm ag mesh [silver], and tape.  MEDICATIONS:  Current Outpatient Medications  Medication Sig Dispense Refill   Cholecalciferol 125 MCG (5000 UT) capsule Take 5,000 Units by mouth daily.     colchicine 0.6 MG tablet Take 1 tablet (0.6 mg total) by mouth as needed. First sign of Gout Flare. Take 2 tablets ( 1.43m) by mouth at first sign of gout flare followed by 1 tablet ( 0.612m after 1 hour. ( max 1.90m60mithin 1 hours) 180 tablet 1   cyanocobalamin 1000 MCG tablet Take 1,000 mcg by mouth every other day.     Fludrocortisone Acetate (FLORINEF PO) Take 0.1 mg by mouth every other day. On alternate days take 1/2 tablet.     furosemide (LASIX) 20 MG tablet Take 20 mg by mouth as needed. For Edema.     gabapentin (NEURONTIN) 600 MG tablet Take 900 mg by mouth daily. Takes 1 and 1/2 tablet by mouth nightly.     hydrocortisone (CORTEF) 10 MG tablet Take by mouth daily. Takes 1 and 1/2 tablet every morning, 1/2 tablet every afternoon, and 1/2 tablet every evening. Take double dose as directed for stress, may take up to 85  tablets monthly.     levothyroxine (SYNTHROID) 75 MCG tablet Take 75 mcg by mouth daily.     metFORMIN (GLUMETZA) 500 MG (MOD) 24 hr tablet Take 500 mg by mouth daily.     mirabegron ER (MYRBETRIQ) 50 MG TB24 tablet Take 1 tablet (50 mg total) by mouth daily. 90 tablet 3   Multiple Vitamins-Minerals (PRESERVISION AREDS 2 PO) Take 1 capsule by mouth in the morning and at bedtime.     PSYLLIUM HUSK PO Take 3.4 g  by mouth every evening.     Accu-Chek Softclix Lancets lancets Use to check blood sugar once daily. Dx: E11.49 (Patient not taking: Reported on 06/19/2022) 100 each 3   blood glucose meter kit and supplies 1 each by Other route as directed. Dispense based on patient and insurance preference. Use up to four times daily as directed. (FOR ICD-10 E10.9, E11.9). (Patient not taking: Reported on 06/19/2022)     cyclobenzaprine (FLEXERIL) 10 MG tablet Take 1 tablet (10 mg total) by mouth 3 (three) times daily as needed for muscle spasms. (Patient not taking: Reported on 06/19/2022) 30 tablet 0   glucose blood (ACCU-CHEK AVIVA PLUS) test strip Use to test blood sugar once daily. Dx: E11.49 (Patient not taking: Reported on 06/19/2022) 100 each 3   No current facility-administered medications for this visit.    Review of Systems  Constitutional:  Negative for appetite change, chills, fatigue, fever and unexpected weight change.  HENT:   Negative for hearing loss and voice change.   Eyes:  Negative for eye problems and icterus.  Respiratory:  Negative for chest tightness, cough and shortness of breath.   Cardiovascular:  Negative for chest pain and leg swelling.  Gastrointestinal:  Negative for abdominal distention, abdominal pain and diarrhea.  Endocrine: Negative for hot flashes.  Genitourinary:  Negative for dysuria.   Musculoskeletal:  Negative for arthralgias.  Skin:  Negative for itching and rash.  Neurological:  Negative for light-headedness and numbness.  Hematological:  Negative for  adenopathy. Does not bruise/bleed easily.  Psychiatric/Behavioral:  Negative for confusion.      PHYSICAL EXAMINATION: ECOG PERFORMANCE STATUS: 1 - Symptomatic but completely ambulatory  Vitals:   06/19/22 0957  BP: 116/77  Pulse: 66  Resp: 18  SpO2: 97%   Filed Weights   06/19/22 0946  Weight: 211 lb (95.7 kg)    Physical Exam Constitutional:      General: He is not in acute distress.    Appearance: He is obese. He is not diaphoretic.  HENT:     Head: Normocephalic and atraumatic.     Nose: Nose normal.     Mouth/Throat:     Pharynx: No oropharyngeal exudate.  Eyes:     General: No scleral icterus.    Pupils: Pupils are equal, round, and reactive to light.  Cardiovascular:     Rate and Rhythm: Normal rate.     Heart sounds: No murmur heard. Pulmonary:     Effort: Pulmonary effort is normal. No respiratory distress.     Breath sounds: No rales.  Chest:     Chest wall: No tenderness.  Abdominal:     General: There is no distension.  Musculoskeletal:        General: Normal range of motion.     Cervical back: Normal range of motion and neck supple.  Skin:    General: Skin is warm and dry.     Findings: No erythema.  Neurological:     Mental Status: He is alert and oriented to person, place, and time. Mental status is at baseline.     Cranial Nerves: No cranial nerve deficit.     Motor: No abnormal muscle tone.  Psychiatric:        Mood and Affect: Mood and affect normal.      LABORATORY DATA:  I have reviewed the data as listed    Latest Ref Rng & Units 06/19/2022   10:45 AM 05/07/2022   12:00 AM 01/20/2021   12:26 PM  CBC  WBC 4.0 - 10.5 K/uL 8.0  7.4     7.5   Hemoglobin 13.0 - 17.0 g/dL 13.7  14.2     14.6   Hematocrit 39.0 - 52.0 % 41.4  43     44.2   Platelets 150 - 400 K/uL 264  292     304      This result is from an external source.      Latest Ref Rng & Units 06/19/2022   10:45 AM 06/11/2022    2:04 PM 05/07/2022   12:00 AM  CMP  Glucose 70  - 99 mg/dL 181     BUN 8 - 23 mg/dL 24     Creatinine 0.61 - 1.24 mg/dL 0.94  1.10    Sodium 135 - 145 mmol/L 134     Potassium 3.5 - 5.1 mmol/L 4.9     Chloride 98 - 111 mmol/L 103     CO2 22 - 32 mmol/L 25     Calcium 8.9 - 10.3 mg/dL 8.8     Total Protein 6.5 - 8.1 g/dL 7.0     Total Bilirubin 0.3 - 1.2 mg/dL 0.6     Alkaline Phos 38 - 126 U/L 82   83      AST 15 - 41 U/L 25   16      ALT 0 - 44 U/L 22   19         This result is from an external source.     RADIOGRAPHIC STUDIES: I have personally reviewed the radiological images as listed and agreed with the findings in the report. CT PANCREAS ABD W/WO  Result Date: 06/12/2022 CLINICAL DATA:  Follow-up pancreatic mass seen on outside CT scan. EXAM: CT ABDOMEN WITHOUT AND WITH CONTRAST TECHNIQUE: Multidetector CT imaging of the abdomen was performed following the standard protocol before and following the bolus administration of intravenous contrast. RADIATION DOSE REDUCTION: This exam was performed according to the departmental dose-optimization program which includes automated exposure control, adjustment of the mA and/or kV according to patient size and/or use of iterative reconstruction technique. CONTRAST:  175m OMNIPAQUE IOHEXOL 300 MG/ML  SOLN COMPARISON:  None Available. FINDINGS: Lower chest: Bilateral lower lobe pulmonary nodules consistent with metastatic disease. Hepatobiliary: No hepatic lesions or intrahepatic biliary dilatation. Gallbladder is mildly contracted and contains a small enhancing nodule measuring 10 mm. This could be a gallbladder polyp. Normal caliber and course of the common bile duct. Pancreas: There is a 4.7 x 3.3 cm enhancing mass involving the descending duodenum and region of the ampulla. It is possible this is a pancreatic cancer but I think it is more likely duodenal or ampullary. This would be amenable to endoscopic biopsy for tissue diagnosis. There is no dilatation of the common bile duct or pancreatic  duct and I think pancreatic cancer is less likely. It may involve the medial pancreatic head. The remainder of the pancreas is unremarkable. Spleen: Normal size.  No focal lesions. Adrenals/Urinary Tract: Status post right adrenalectomy. Solid enhancing left adrenal gland mass measuring 2.8 x 2.7 cm. This is likely metastatic disease. The right kidney is unremarkable. The left kidney is surgically absent. Stomach/Bowel: The stomach, visualized small bowel and visualized colon are unremarkable. Vascular/Lymphatic: The aorta and branch vessels are patent. The major venous structures are patent. Small scattered peripancreatic and periportal lymph nodes. No retroperitoneal adenopathy. Other: No ascites or abdominal wall hernia. Musculoskeletal: No significant bony findings. IMPRESSION: 1. 4.7 x 3.3 cm enhancing  mass involving the descending duodenum and region of the ampulla. It is possible this is a pancreatic cancer but I think it is more likely duodenal or ampullary. This would be amenable to endoscopic biopsy for tissue diagnosis. 2. 2.8 x 2.7 cm left adrenal gland mass likely metastatic disease. 3. Bilateral lower lobe pulmonary nodules consistent with metastatic disease. 4. Status post right adrenalectomy and left nephrectomy. 5. 10 mm enhancing nodule in the gallbladder could be a gallbladder polyp. Electronically Signed   By: Marijo Sanes M.D.   On: 06/12/2022 13:00    ASSESSMENT & PLAN:  Pancreatic mass CT images were reviewed and discussed with patient.  Differential diagnosis: metastatic RCC vs primary pancreatic neoplasm Check CA 19.9 This mass is likely amendable vis EUS biopsy.  Patient is not interested with any invasive diagnostic work up and would like to be observed.  Check PET scan.   Renal cell carcinoma, left (HCC) History of left kidney clear cell carcinoma, with adrenal metastatic disease s/p bilateral adrenalectomy.  Recent images were reviewed.  Recommend PET scan.  Patient is  not interested in any treatment and biopsy.  Continue surveillance.    Goals of care, counseling/discussion Discussed with patient.    Orders Placed This Encounter  Procedures   NM PET Image Initial (PI) Skull Base To Thigh    Standing Status:   Future    Standing Expiration Date:   06/19/2023    Order Specific Question:   If indicated for the ordered procedure, I authorize the administration of a radiopharmaceutical per Radiology protocol    Answer:   Yes    Order Specific Question:   Preferred imaging location?    Answer:   [facility]   Comprehensive metabolic panel    Standing Status:   Future    Number of Occurrences:   1    Standing Expiration Date:   06/20/2023   CBC with Differential/Platelet    Standing Status:   Future    Number of Occurrences:   1    Standing Expiration Date:   06/20/2023   Lactate dehydrogenase    Standing Status:   Future    Number of Occurrences:   1    Standing Expiration Date:   06/20/2023   Cancer antigen 19-9    Standing Status:   Future    Number of Occurrences:   1    Standing Expiration Date:   06/19/2023   Follow up after PET scan   All questions were answered. The patient knows to call the clinic with any problems, questions or concerns. No barriers to learning was detected.  Earlie Server, MD 06/19/2022

## 2022-06-19 NOTE — Assessment & Plan Note (Signed)
CT images were reviewed and discussed with patient.  Differential diagnosis: metastatic RCC vs primary pancreatic neoplasm Check CA 19.9 This mass is likely amendable vis EUS biopsy.  Patient is not interested with any invasive diagnostic work up and would like to be observed.  Check PET scan.

## 2022-06-19 NOTE — Assessment & Plan Note (Signed)
Discussed with patient

## 2022-06-20 LAB — CANCER ANTIGEN 19-9: CA 19-9: 34 U/mL (ref 0–35)

## 2022-06-22 ENCOUNTER — Ambulatory Visit: Payer: Medicare Other

## 2022-06-22 DIAGNOSIS — R2681 Unsteadiness on feet: Secondary | ICD-10-CM

## 2022-06-22 DIAGNOSIS — R2689 Other abnormalities of gait and mobility: Secondary | ICD-10-CM

## 2022-06-22 DIAGNOSIS — R296 Repeated falls: Secondary | ICD-10-CM

## 2022-06-22 DIAGNOSIS — R262 Difficulty in walking, not elsewhere classified: Secondary | ICD-10-CM

## 2022-06-22 DIAGNOSIS — M6281 Muscle weakness (generalized): Secondary | ICD-10-CM

## 2022-06-22 DIAGNOSIS — R269 Unspecified abnormalities of gait and mobility: Secondary | ICD-10-CM

## 2022-06-22 DIAGNOSIS — R278 Other lack of coordination: Secondary | ICD-10-CM

## 2022-06-22 NOTE — Therapy (Signed)
OUTPATIENT PHYSICAL THERAPY TREATMENT NOTE/RECERTIFICATION  Patient Name: Tony Huffman MRN: [id number] DOB:[birth year], 76 y.o., male Today's Date: 06/23/2022  PCP: Sherrie Mustache, NP REFERRING PROVIDER: Dr. Jennings Books   PT End of Session - 06/22/22 0849     Visit Number 27    Number of Visits 85    Date for PT Re-Evaluation 09/14/22    Authorization Type Medicare Part A & B; BCBS Secondary    Authorization Time Period 03/30/22-06/22/22; 06/22/2022- 09/14/2022    Progress Note Due on Visit 70    PT Start Time 0845    PT Stop Time 0929    PT Time Calculation (min) 44 min    Equipment Utilized During Treatment Gait belt    Activity Tolerance Patient tolerated treatment well;No increased pain    Behavior During Therapy [facility] for tasks assessed/performed                Past Medical History:  Diagnosis Date   Adrenal insufficiency ([place])    Anxiety    Chronic kidney disease, stage 3 ([place]) 11/13/2011   Erectile dysfunction    GERD (gastroesophageal reflux disease)    Gout 09/04/2013   H/O atrial flutter 03/07/2017   Hyperlipidemia 06/16/2014   Hypertension    Hypothyroidism (acquired) 04/14/2014   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[place]) 11/10/2010   OSA (obstructive sleep apnea) 07/24/2016   Pancreatic mass 08/01/2018   Renal carcinoma ([place]) 06/16/2015   RLS (restless legs syndrome) 07/24/2016   Sleep apnea    Testicular hypofunction    Tussive syncopes 10/15/2013   Type 2 diabetes mellitus ([place])    Vitamin D deficiency disease    Past Surgical History:  Procedure Laterality Date   ADRENALECTOMY     CATARACT EXTRACTION  10/2014   CATARACT EXTRACTION EXTRACAPSULAR  11/05/2016   with Insertion Intraocular Prostheis.    COLONOSCOPY  05/06/2017   with removal lesions by snare.   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   Other abnormal tumor markers 06/19/2022   Goals of care, counseling/discussion 06/19/2022   Advance care planning 05/27/2022   Gait abnormality 05/27/2022   Diabetes mellitus without complication ([place]) 54/27/0623   Addisons disease ([place]) 03/16/2021   Low HDL (under 40) 03/16/2021   Numbness and tingling 11/21/2020   Pancreatic mass 08/01/2018   H/O sebaceous cyst 01/06/2018   Edema leg 03/07/2017   OSA (obstructive sleep apnea) 07/24/2016   RLS (restless legs syndrome) 07/24/2016   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   Atrial fibrillation and flutter ([place]) 10/20/2014   Hyperlipidemia 06/16/2014   Cough syncope 10/15/2013   Dyslipidemia 09/04/2013   Gout 09/04/2013   Chronic kidney disease, stage III (moderate) ([place]) 11/13/2011   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   Hypothyroidism (acquired) 11/30/2010   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[place]) 11/10/2010    REFERRING DIAG: Unspecified abnormalities of gait and mobility  THERAPY DIAG:  Abnormality of gait and mobility  Difficulty in walking, not elsewhere classified  Muscle weakness (generalized)  Other abnormalities of gait and mobility  Other lack of coordination  Repeated falls  Unsteadiness on feet  Rationale for Evaluation and Treatment Rehabilitation  PERTINENT HISTORY: Patient is a 76 year old male with referral from Neurology- Dr. Joselyn Arrow for unspecified abnormality of gait. He presents today with his wife and reports > 6 month history of progressive issues with poor balance and multiple falls. Patient has renal Cancer and currently has Pallative care- Was stabilized out of Hospice. Patient presents with the following past medical history: A-Fib and flutter, Lung Cancer, Obstructive sleep apnea, Upper respiratory infection, Addison's disease,  Hypothyroidism, Renal Cancer, DM, gout, arthritis, Chronic kidney disease and Restless leg syndrome. Patient lives with wife in independent living at [facility]. Prior level of function was independent with transfers and mobility using walker yet some assist with dressing.   PRECAUTIONS: falls  SUBJECTIVE:   Patient reports having some past cramping pain in lateral aspect of right foot but no pain today.  PAIN:  Are you having pain? No     TODAY'S TREATMENT: 05/18/22    Therex:   Nustep for LE strength at increased resistance today:   L1- 2 min   L3- 2 min   L4- 2 min       Reassessed goals for recert visit- Added new balance goal to reflect ongoing issues with balance.    PATIENT EDUCATION: Education details: exercise technique.  Person educated: Patient Education method: Explanation, Demonstration, Tactile cues, and Verbal cues Education comprehension: verbalized understanding, returned demonstration, verbal cues required, tactile cues required, and needs further education   HOME EXERCISE PROGRAM:  Access Code: [email or web address] URL: [email or web address] Date: 05/04/2022 Prepared by: Sande Brothers  Exercises - Sidelying Thoracic Rotation with Open Book  - 1 x daily - 3 sets - 10 reps - Supine Lower Trunk Rotation  - 1 x daily - 3 sets - 10 reps - Quadruped Full Range Thoracic Rotation with Reach  - 1 x daily - 3 sets - 10 reps    PT Short Term Goals -       PT SHORT TERM GOAL #1   Title Pt will be independent with HEP in order to improve strength and balance in order to decrease fall risk and improve function at home and work.    Baseline 07/21/2021- Paitent presents with no formal HEP in place. 08/21/2021- Patient reports compliance with HEP and states no questions as of today- able to perform standin using his rollator for support.    Time 6    Period Weeks    Status Achieved    Target Date 09/01/21              PT Long Term Goals -       PT LONG TERM GOAL #1   Title Pt will improve FOTO to target score of 53  to display perceived improvements in ability to complete ADL's.    Baseline 07/21/2021= 45; 1/5=51. 10/12/2021- Just reassessed for progress note so did not reassess again- will continue with goal into new certification 0/17: 49% 01/07/2022= 59%   Time 12    Period Weeks    Status Achieved    Target Date 03/30/22      PT LONG TERM GOAL #2   Title Pt will improve BERG by at least 5 points in order to demonstrate clinically significant improvement in balance.    Baseline 07/21/2021= 39/56; 08/21/2021=46/56- Will keep goal active to ensure patient able to test consistently; 10/05/2021= 52/56    Time 12    Period Weeks    Status Achieved    Target Date 10/13/21      PT  LONG TERM GOAL #3   Title Pt will decrease 5TSTS by at least 5 seconds in order to demonstrate clinically significant improvement in LE strength.    Baseline 07/21/2021= 21 sec without UE Support; 11/21= 14.0 sec without UE support-.Will Keep goal active to ensure consistency.  10/05/2021=14.75 sec without UE support  (improved from 21 sec and unchanged from last assessment of 14 sec)    Time 12    Period Weeks    Status Achieved    Target Date 10/13/21      PT LONG TERM GOAL #4   Title Pt will decrease TUG to below  18 seconds/decrease in order to demonstrate decreased fall risk.    Baseline 07/21/2021= 23 sec with 4WW; 08/21/2021= 22.13 sec with 4WW; 10/05/2021= 19.51 sec avg with 4WW 2/10: 15.4 seconds with cane  12/14/2021= 18.0 sec; 06/22/2022= 16.95 sec with SPC   Time 12    Period Weeks    Status Achieved    Target Date 01/05/22      PT LONG TERM GOAL #5   Title Pt will increase 10MWT by at least 0.23 m/s in order to demonstrate clinically significant improvement in community ambulation.    Baseline 07/21/2021= 0.38ms; 08/21/2021= 0.57 m/s using 4WW; 10/05/2021= 0.6 m/s avg using 4WW 2/10: 0.51 m/s with cane. 12/14/2021= 0.65 m/s using SPC ; 04/20/22: .599  m/s SPC; 06/22/2022= 0.57 m/s SPC   Time 12    Period Weeks    Status Partially Met    Target Date 09/14/2022     PT LONG TERM GOAL #6   Title Patient will increase six minute walk test distance to > 100 for progression to community ambulator and improve gait ability    Baseline 01/05/2022= 495 feet using SPC; 01/29/2022= 470 feet in 4 min 30 sec using SPC ; 04/20/2022: 478'; 570' in 6 min with SPC (several stops due to patient leaning too far forward and out of control)    Time 12    Period Weeks    Status On-going    Target Date 09/14/2022   PT LONG TERM GOAL #7  Title Pt will improve DGI by at least 3 points in order to demonstrate clinically significant improvement in balance and decreased risk for falls.  Baseline 06/22/2022-12/24  Time 12   Period Weeks   Status Initial  Target Date 09/14/2022            Plan - 03/23/22 0855     Clinical Impression Statement  Patient presents with good motivation for today's visit. He presents with ongoing left sided inattention/weakness. This is most noticeable when walking > 150 feet as he will continue to begin to shuffle/drag left leg placing him at increased risk of falling. Through this cert- focus has been on trying to strengthen Left LE and patient presents with some improvement including 6 min walk test and TUG however no significant improvement in gait speed. Added new balance goal to more closely focus on current dynamic issues.  Patient's condition has the potential to improve in response to therapy. Maximum improvement is yet to be obtained. The anticipated improvement is attainable and reasonable in a generally predictable time.  Pt will continue to benefit from skilled PT services to improve risk for falls via postural and gait stability and strengthening.     Personal Factors and Comorbidities Comorbidity 3+    Comorbidities A- Fib, Renal cancer, Addison's disease, gout, arthritis, DM    Examination-Activity Limitations Caring for  Others;Carry;Dressing;Lift;Reach Overhead;Squat;Stairs;Transfers;Stand  Examination-Participation Restrictions Investment banker, operational;Yard Work;Shop    Stability/Clinical Decision Making Evolving/Moderate complexity    Rehab Potential Good    PT Frequency 1x / week    PT Duration 12 weeks    PT Treatment/Interventions ADLs/Self Care Home Management;Canalith Repostioning;Cryotherapy;Electrical Stimulation;Moist Heat;Traction;Ultrasound;DME Instruction;Gait training;Stair training;Functional mobility training;Therapeutic activities;Therapeutic exercise;Balance training;Neuromuscular re-education;Patient/family education;Orthotic Fit/Training;Wheelchair mobility training;Manual techniques;Passive range of motion;Dry needling;Energy conservation;Taping;Vestibular    PT Next Visit Plan Continue with balance training and LE strengthening for safety with mobility. Please incorporate postural strengthening due to anterior trunk lean with gait.    PT Home Exercise Plan 08/17/2021-Access Code: [email or web address]; no updates    Consulted and Agree with Plan of Care Patient;Family member/caregiver    Family Member Consulted WIfe- [name]             Ollen Bowl, PT Physical Therapist- [facility]  06/23/2022, 7:35 AM

## 2022-06-26 ENCOUNTER — Ambulatory Visit: Payer: Medicare Other

## 2022-06-26 DIAGNOSIS — R262 Difficulty in walking, not elsewhere classified: Secondary | ICD-10-CM

## 2022-06-26 DIAGNOSIS — R2681 Unsteadiness on feet: Secondary | ICD-10-CM

## 2022-06-26 DIAGNOSIS — R269 Unspecified abnormalities of gait and mobility: Secondary | ICD-10-CM

## 2022-06-26 DIAGNOSIS — R2689 Other abnormalities of gait and mobility: Secondary | ICD-10-CM

## 2022-06-26 DIAGNOSIS — R296 Repeated falls: Secondary | ICD-10-CM

## 2022-06-26 DIAGNOSIS — M6281 Muscle weakness (generalized): Secondary | ICD-10-CM

## 2022-06-26 DIAGNOSIS — R278 Other lack of coordination: Secondary | ICD-10-CM

## 2022-06-26 NOTE — Therapy (Signed)
OUTPATIENT PHYSICAL THERAPY TREATMENT NOTE  Patient Name: Tony Huffman MRN: [id number] DOB:[birth year], 76 y.o., male Today's Date: 06/27/2022  PCP: Sherrie Mustache, NP REFERRING PROVIDER: Dr. Jennings Books   PT End of Session - 06/26/22 1428     Visit Number 12    Number of Visits 39    Date for PT Re-Evaluation 09/14/22    Authorization Type Medicare Part A & B; BCBS Secondary    Authorization Time Period 03/30/22-06/22/22; 06/22/2022- 09/14/2022    Progress Note Due on Visit 70    PT Start Time 1430    PT Stop Time 1514    PT Time Calculation (min) 44 min    Equipment Utilized During Treatment Gait belt    Activity Tolerance Patient tolerated treatment well;No increased pain    Behavior During Therapy [facility] for tasks assessed/performed                Past Medical History:  Diagnosis Date   Adrenal insufficiency ([place])    Anxiety    Chronic kidney disease, stage 3 ([place]) 11/13/2011   Erectile dysfunction    GERD (gastroesophageal reflux disease)    Gout 09/04/2013   H/O atrial flutter 03/07/2017   Hyperlipidemia 06/16/2014   Hypertension    Hypothyroidism (acquired) 04/14/2014   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[place]) 11/10/2010   OSA (obstructive sleep apnea) 07/24/2016   Pancreatic mass 08/01/2018   Renal carcinoma ([place]) 06/16/2015   RLS (restless legs syndrome) 07/24/2016   Sleep apnea    Testicular hypofunction    Tussive syncopes 10/15/2013   Type 2 diabetes mellitus ([place])    Vitamin D deficiency disease    Past Surgical History:  Procedure Laterality Date   ADRENALECTOMY     CATARACT EXTRACTION  10/2014   CATARACT EXTRACTION EXTRACAPSULAR  11/05/2016   with Insertion Intraocular Prostheis.    COLONOSCOPY  05/06/2017   with removal lesions by snare.   CYSTECTOMY     Upper Neck/Chest Area, [facility]   NEPHRECTOMY     POPLITEAL SYNOVIAL CYST EXCISION     SPINE SURGERY  1989   THYROIDECTOMY     patient states they only removed half    TONSILLECTOMY     Patient Active Problem List   Diagnosis Date Noted   Renal cell carcinoma, left ([place]) 06/19/2022   Other abnormal tumor markers 06/19/2022   Goals of care, counseling/discussion 06/19/2022   Advance care planning 05/27/2022   Gait abnormality 05/27/2022   Diabetes mellitus without complication ([place]) 86/76/1950   Addisons disease ([place]) 03/16/2021   Low HDL (under 40) 03/16/2021   Numbness and tingling 11/21/2020   Pancreatic mass 08/01/2018   H/O sebaceous cyst 01/06/2018   Edema leg 03/07/2017   OSA (obstructive sleep apnea) 07/24/2016   RLS (restless legs syndrome) 07/24/2016   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   Atrial fibrillation and flutter ([place]) 10/20/2014   Hyperlipidemia 06/16/2014   Cough syncope 10/15/2013   Dyslipidemia 09/04/2013   Gout 09/04/2013   Chronic kidney disease, stage III (moderate) ([place]) 11/13/2011   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   Hypothyroidism (acquired) 11/30/2010   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[place]) 11/10/2010    REFERRING DIAG: Unspecified abnormalities of gait and mobility  THERAPY DIAG:  Abnormality of gait and mobility  Difficulty in walking, not elsewhere classified  Muscle weakness (generalized)  Other abnormalities of gait and mobility  Other lack of coordination  Repeated falls  Unsteadiness on feet  Rationale for Evaluation and Treatment Rehabilitation  PERTINENT HISTORY: Patient is a 76 year old male with referral from Neurology- Dr. Joselyn Arrow for unspecified abnormality of gait. He presents today with his wife and reports > 6 month history of progressive issues with poor balance and multiple falls. Patient has renal Cancer and currently has Pallative care- Was stabilized out of Hospice. Patient presents with the following past medical history: A-Fib and flutter, Lung Cancer, Obstructive sleep apnea, Upper respiratory infection, Addison's disease, Hypothyroidism, Renal Cancer,  DM, gout, arthritis, Chronic kidney disease and Restless leg syndrome. Patient lives with wife in independent living at [facility]. Prior level of function was independent with transfers and mobility using walker yet some assist with dressing.   PRECAUTIONS: falls  SUBJECTIVE:   Patient reports having some past cramping pain in lateral aspect of right foot but no pain today.  PAIN:  Are you having pain? No     TODAY'S TREATMENT: 05/18/22    Therex:   Nustep for LE strength at increased resistance today:   L1- 1 min   L2- 1 min   L3- 1 min   L4- 50mn   L5- 135m     6 min walk with use of 4WW-  600 feet yet several stops secondary to walker too far in front and dragging left LE  10 MWT= 13.78 sec and 13.48 sec using 4WW= 0.73 m/s avg  Postural strengthening:  Wall posture hold with shoulder ABD/ER against wall x 60 sec x 2 sets  Scap row with blue TB x 12 reps x 2 sets   Standing shoulder horizontal Abd- YTB x 12 reps x 2 sets  Standing with shoulder abd to 90 deg - and then ER with YTB x 12 reps x 2 sets  Shoulder ext with blue TB x 12 reps x 2 sets   PATIENT EDUCATION: Education details: exercise technique.  Person educated: Patient Education method: Explanation, Demonstration, Tactile cues, and Verbal cues Education comprehension: verbalized understanding, returned demonstration, verbal cues required, tactile cues required, and needs further education   HOME EXERCISE PROGRAM:  Access Code: [email or web address] URL: [email or web address] Date: 05/04/2022 Prepared by: JeSande BrothersExercises - Sidelying Thoracic Rotation with Open Book  - 1 x daily - 3 sets - 10 reps - Supine Lower Trunk Rotation  - 1 x daily - 3 sets - 10 reps - Quadruped Full Range Thoracic Rotation with Reach  - 1 x daily - 3 sets - 10 reps    PT Short Term Goals -       PT SHORT TERM GOAL #1   Title Pt will be independent with HEP in order to improve strength and balance in order to  decrease fall risk and improve function at home and work.    Baseline 07/21/2021- Paitent presents with no formal HEP in place. 08/21/2021- Patient reports compliance with HEP and states no questions as of today- able to perform standin using his rollator for support.    Time 6    Period Weeks    Status Achieved    Target Date 09/01/21              PT Long Term Goals -      PT LONG TERM GOAL #1   Title Pt will improve FOTO to target score of 53  to display perceived improvements in ability to complete ADL's.    Baseline 07/21/2021= 45; 1/5=51. 10/12/2021- Just reassessed for progress note so did not reassess again- will continue with goal into new certification  2/10: 49% 01/07/2022= 59%   Time 12    Period Weeks    Status Achieved    Target Date 03/30/22      PT LONG TERM GOAL #2   Title Pt will improve BERG by at least 5 points in order to demonstrate clinically significant improvement in balance.    Baseline 07/21/2021= 39/56; 08/21/2021=46/56- Will keep goal active to ensure patient able to test consistently; 10/05/2021= 52/56    Time 12    Period Weeks    Status Achieved    Target Date 10/13/21      PT LONG TERM GOAL #3   Title Pt will decrease 5TSTS by at least 5 seconds in order to demonstrate clinically significant improvement in LE strength.    Baseline 07/21/2021= 21 sec without UE Support; 11/21= 14.0 sec without UE support-.Will Keep goal active to ensure consistency.  10/05/2021=14.75 sec without UE support  (improved from 21 sec and unchanged from last assessment of 14 sec)    Time 12    Period Weeks    Status Achieved    Target Date 10/13/21      PT LONG TERM GOAL #4   Title Pt will decrease TUG to below  18 seconds/decrease in order to demonstrate decreased fall risk.    Baseline 07/21/2021= 23 sec with 4WW; 08/21/2021= 22.13 sec with 4WW; 10/05/2021= 19.51 sec avg with 4WW 2/10: 15.4 seconds with cane  12/14/2021= 18.0 sec; 06/22/2022= 16.95 sec with SPC   Time 12     Period Weeks    Status Achieved    Target Date 01/05/22      PT LONG TERM GOAL #5   Title Pt will increase 10MWT by at least 0.23 m/s in order to demonstrate clinically significant improvement in community ambulation.    Baseline 07/21/2021= 0.15ms; 08/21/2021= 0.57 m/s using 4WW; 10/05/2021= 0.6 m/s avg using 4WW 2/10: 0.51 m/s with cane. 12/14/2021= 0.65 m/s using SPC ; 04/20/22: .599 m/s SPC; 06/22/2022= 0.57 m/s SPC.   Time 12    Period Weeks    Status Partially Met    Target Date 09/14/2022     PT LONG TERM GOAL #6   Title Patient will increase six minute walk test distance to > 100 for progression to community ambulator and improve gait ability    Baseline 01/05/2022= 495 feet using SPC; 01/29/2022= 470 feet in 4 min 30 sec using SPC ; 04/20/2022: 478'; 570' in 6 min with SPC (several stops due to patient leaning too far forward and out of control)    Time 12    Period Weeks    Status On-going    Target Date 09/14/2022   PT LONG TERM GOAL #7  Title Pt will improve DGI by at least 3 points in order to demonstrate clinically significant improvement in balance and decreased risk for falls.  Baseline 06/22/2022-12/24  Time 12   Period Weeks   Status Initial  Target Date 09/14/2022            Plan - 03/23/22 0855     Clinical Impression Statement  Continued to focus on plan of care including mobility goals. Reassessed 6 min walk with use of rollator to see if any improvement with use of more restrictive device and patient presents with challenges including pushing walker out to far despite repetitive cues to stop and regroup. He seems to lack attention to his left side which worsens with distance. At this time he is probably safer with use of  cane vs. Walker however still high risk of falling and presents requiring supervision for safe mobility.  He was able to follow all VC for postural strengthening stating the exericses were tough and feeling like he would be sore.   Pt will continue to  benefit from skilled PT services to improve risk for falls via postural and gait stability and strengthening.     Personal Factors and Comorbidities Comorbidity 3+    Comorbidities A- Fib, Renal cancer, Addison's disease, gout, arthritis, DM    Examination-Activity Limitations Caring for Others;Carry;Dressing;Lift;Reach Overhead;Squat;Stairs;Transfers;Stand    Examination-Participation Restrictions Investment banker, operational;Yard Work;Shop    Stability/Clinical Decision Making Evolving/Moderate complexity    Rehab Potential Good    PT Frequency 1x / week    PT Duration 12 weeks    PT Treatment/Interventions ADLs/Self Care Home Management;Canalith Repostioning;Cryotherapy;Electrical Stimulation;Moist Heat;Traction;Ultrasound;DME Instruction;Gait training;Stair training;Functional mobility training;Therapeutic activities;Therapeutic exercise;Balance training;Neuromuscular re-education;Patient/family education;Orthotic Fit/Training;Wheelchair mobility training;Manual techniques;Passive range of motion;Dry needling;Energy conservation;Taping;Vestibular    PT Next Visit Plan Continue with balance training and LE strengthening for safety with mobility. Please incorporate postural strengthening due to anterior trunk lean with gait.    PT Home Exercise Plan 08/17/2021-Access Code: [email or web address]; no updates    Consulted and Agree with Plan of Care Patient;Family member/caregiver    Family Member Consulted WIfe- [name]             Ollen Bowl, PT Physical Therapist- [facility]  06/27/2022, 10:02 AM

## 2022-06-29 ENCOUNTER — Ambulatory Visit: Payer: Medicare Other

## 2022-07-05 ENCOUNTER — Ambulatory Visit
Admission: RE | Admit: 2022-07-05 | Discharge: 2022-07-05 | Disposition: A | Discharge status: Home / Self Care | Payer: Medicare Other | Source: Ambulatory Visit | Attending: Oncology | Admitting: Oncology

## 2022-07-05 DIAGNOSIS — R590 Localized enlarged lymph nodes: Secondary | ICD-10-CM | POA: Diagnosis not present

## 2022-07-05 DIAGNOSIS — Z85118 Personal history of other malignant neoplasm of bronchus and lung: Secondary | ICD-10-CM | POA: Diagnosis not present

## 2022-07-05 DIAGNOSIS — E896 Postprocedural adrenocortical (-medullary) hypofunction: Secondary | ICD-10-CM | POA: Insufficient documentation

## 2022-07-05 DIAGNOSIS — I700 Atherosclerosis of aorta: Secondary | ICD-10-CM | POA: Insufficient documentation

## 2022-07-05 DIAGNOSIS — N289 Disorder of kidney and ureter, unspecified: Secondary | ICD-10-CM | POA: Insufficient documentation

## 2022-07-05 DIAGNOSIS — R918 Other nonspecific abnormal finding of lung field: Secondary | ICD-10-CM | POA: Insufficient documentation

## 2022-07-05 DIAGNOSIS — Z7952 Long term (current) use of systemic steroids: Secondary | ICD-10-CM | POA: Insufficient documentation

## 2022-07-05 DIAGNOSIS — Z905 Acquired absence of kidney: Secondary | ICD-10-CM | POA: Insufficient documentation

## 2022-07-05 DIAGNOSIS — C642 Malignant neoplasm of left kidney, except renal pelvis: Secondary | ICD-10-CM | POA: Diagnosis not present

## 2022-07-05 DIAGNOSIS — E119 Type 2 diabetes mellitus without complications: Secondary | ICD-10-CM | POA: Diagnosis not present

## 2022-07-05 DIAGNOSIS — C259 Malignant neoplasm of pancreas, unspecified: Secondary | ICD-10-CM | POA: Diagnosis present

## 2022-07-05 LAB — GLUCOSE, CAPILLARY: Glucose-Capillary: 145 mg/dL — ABNORMAL HIGH (ref 70–99)

## 2022-07-05 MED ORDER — FLUDEOXYGLUCOSE F - 18 (FDG) INJECTION
10.9000 | Freq: Once | INTRAVENOUS | Status: AC | PRN
  Administered 2022-07-05: 11.81 via INTRAVENOUS

## 2022-07-06 ENCOUNTER — Ambulatory Visit: Payer: Medicare Other | Attending: Neurology

## 2022-07-06 DIAGNOSIS — R262 Difficulty in walking, not elsewhere classified: Secondary | ICD-10-CM | POA: Insufficient documentation

## 2022-07-06 DIAGNOSIS — R269 Unspecified abnormalities of gait and mobility: Secondary | ICD-10-CM | POA: Insufficient documentation

## 2022-07-06 DIAGNOSIS — R278 Other lack of coordination: Secondary | ICD-10-CM | POA: Insufficient documentation

## 2022-07-06 DIAGNOSIS — R2689 Other abnormalities of gait and mobility: Secondary | ICD-10-CM | POA: Insufficient documentation

## 2022-07-06 DIAGNOSIS — R296 Repeated falls: Secondary | ICD-10-CM | POA: Insufficient documentation

## 2022-07-06 DIAGNOSIS — M6281 Muscle weakness (generalized): Secondary | ICD-10-CM | POA: Diagnosis present

## 2022-07-06 DIAGNOSIS — R2681 Unsteadiness on feet: Secondary | ICD-10-CM | POA: Insufficient documentation

## 2022-07-06 NOTE — Therapy (Signed)
OUTPATIENT PHYSICAL THERAPY TREATMENT NOTE/Physical Therapy Progress Note   Dates of reporting period  04/20/2022   to   07/06/2022  Patient Name: Tony Huffman MRN: [id number] DOB:June 29, 1946, 76 y.o., male Today's Date: 07/06/2022  PCP: Sherrie Mustache, NP REFERRING PROVIDER: Dr. Jennings Books   PT End of Session - 07/06/22 0848     Visit Number 27    Number of Visits 59    Date for PT Re-Evaluation 09/14/22    Authorization Type Medicare Part A & B; BCBS Secondary    Authorization Time Period 03/30/22-06/22/22; 06/22/2022- 09/14/2022    Progress Note Due on Visit 70    PT Start Time 0840    PT Stop Time 0929    PT Time Calculation (min) 49 min    Equipment Utilized During Treatment Gait belt    Activity Tolerance Patient tolerated treatment well;No increased pain    Behavior During Therapy [facility] for tasks assessed/performed                 Past Medical History:  Diagnosis Date   Adrenal insufficiency ([place])    Anxiety    Chronic kidney disease, stage 3 ([place]) 11/13/2011   Erectile dysfunction    GERD (gastroesophageal reflux disease)    Gout 09/04/2013   H/O atrial flutter 03/07/2017   Hyperlipidemia 06/16/2014   Hypertension    Hypothyroidism (acquired) 04/14/2014   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[place]) 11/10/2010   OSA (obstructive sleep apnea) 07/24/2016   Pancreatic mass 08/01/2018   Renal carcinoma ([place]) 06/16/2015   RLS (restless legs syndrome) 07/24/2016   Sleep apnea    Testicular hypofunction    Tussive syncopes 10/15/2013   Type 2 diabetes mellitus ([place])    Vitamin D deficiency disease    Past Surgical History:  Procedure Laterality Date   ADRENALECTOMY     CATARACT EXTRACTION  10/2014   CATARACT EXTRACTION EXTRACAPSULAR  11/05/2016   with Insertion Intraocular Prostheis.    COLONOSCOPY  05/06/2017   with removal lesions by snare.   CYSTECTOMY     Upper Neck/Chest Area, [facility]   NEPHRECTOMY     POPLITEAL SYNOVIAL CYST EXCISION      SPINE SURGERY  1989   THYROIDECTOMY     patient states they only removed half   TONSILLECTOMY     Patient Active Problem List   Diagnosis Date Noted   Renal cell carcinoma, left ([place]) 06/19/2022   Other abnormal tumor markers 06/19/2022   Goals of care, counseling/discussion 06/19/2022   Advance care planning 05/27/2022   Gait abnormality 05/27/2022   Diabetes mellitus without complication ([place]) 41/28/7867   Addisons disease ([place]) 03/16/2021   Low HDL (under 40) 03/16/2021   Numbness and tingling 11/21/2020   Pancreatic mass 08/01/2018   H/O sebaceous cyst 01/06/2018   Edema leg 03/07/2017   OSA (obstructive sleep apnea) 07/24/2016   RLS (restless legs syndrome) 07/24/2016   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   Atrial fibrillation and flutter ([place]) 10/20/2014   Hyperlipidemia 06/16/2014   Cough syncope 10/15/2013   Dyslipidemia 09/04/2013   Gout 09/04/2013   Chronic kidney disease, stage III (moderate) ([place]) 11/13/2011   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   Hypothyroidism (acquired) 11/30/2010   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[place]) 11/10/2010    REFERRING DIAG: Unspecified abnormalities of gait and mobility  THERAPY DIAG:  Abnormality of gait and mobility  Difficulty in walking, not elsewhere classified  Muscle weakness (generalized)  Other abnormalities of gait and mobility  Other lack of coordination  Repeated falls  Unsteadiness on feet  Rationale for Evaluation and Treatment Rehabilitation  PERTINENT HISTORY: Patient is a 76 year old male with referral from Neurology- Dr. Joselyn Arrow for unspecified abnormality of gait. He presents today with his wife and reports > 6 month history of progressive issues with poor balance and multiple falls. Patient has renal Cancer and currently has Pallative care- Was stabilized out of Hospice. Patient presents with the following past medical history: A-Fib and flutter, Lung Cancer, Obstructive  sleep apnea, Upper respiratory infection, Addison's disease, Hypothyroidism, Renal Cancer, DM, gout, arthritis, Chronic kidney disease and Restless leg syndrome. Patient lives with wife in independent living at [facility]. Prior level of function was independent with transfers and mobility using walker yet some assist with dressing.   PRECAUTIONS: falls  SUBJECTIVE:       PAIN:  Are you having pain? No     TODAY'S TREATMENT: 05/18/22    Therex:   Nustep for LE strength at increased resistance today:   L1- 1 min   L2- 1 min   L3- 1 min   L4- 30mn   L5- 165m    Reassessed goals for progress note - Visit #70- See goals for specific details   6 min walk with use of SPC - 450 feet in 4 min 10 sec yet several stops secondary to walker too far in front and dragging left - Patient became unsafe- leaning too far anteriorly and dragging left LE- unable to self correct or with verbal cues today.    10 MWT= 0.72  m/s avg   DGI= 15/24  Neuromuscular re-ed:    Practice SLS- multiple attempts each Leg- Patient at best able to stand on right LE 10 sec and left x 8 yet mostly around 3-4 sec range prior to    Heel to toe gait sequencing in // bars (2 hedgehogs used for reference points for heelstrike and toe off) with R UE support (to simulate bar) x 20 reps each LE - good ability to swing left LE today and land foot in appropriate positions.        PATIENT EDUCATION: Education details: exercise technique.  Person educated: Patient Education method: Explanation, Demonstration, Tactile cues, and Verbal cues Education comprehension: verbalized understanding, returned demonstration, verbal cues required, tactile cues required, and needs further education   HOME EXERCISE PROGRAM:  Access Code: [email or web address] URL: [email or web address] Date: 05/04/2022 Prepared by: JeSande BrothersExercises - Sidelying Thoracic Rotation with Open Book  - 1 x daily - 3 sets - 10 reps -  Supine Lower Trunk Rotation  - 1 x daily - 3 sets - 10 reps - Quadruped Full Range Thoracic Rotation with Reach  - 1 x daily - 3 sets - 10 reps    PT Short Term Goals -       PT SHORT TERM GOAL #1   Title Pt will be independent with HEP in order to improve strength and balance in order to decrease fall risk and improve function at home and work.    Baseline 07/21/2021- Paitent presents with no formal HEP in place. 08/21/2021- Patient reports compliance with HEP and states no questions as of today- able to perform standin using his rollator for support.    Time 6    Period Weeks    Status Achieved    Target Date 09/01/21              PT Long Term Goals -  PT LONG TERM GOAL #1   Title Pt will improve FOTO to target score of 53  to display perceived improvements in ability to complete ADL's.    Baseline 07/21/2021= 45; 1/5=51. 10/12/2021- Just reassessed for progress note so did not reassess again- will continue with goal into new certification 9/47: 65% 01/07/2022= 59%   Time 12    Period Weeks    Status Achieved    Target Date 03/30/22      PT LONG TERM GOAL #2   Title Pt will improve BERG by at least 5 points in order to demonstrate clinically significant improvement in balance.    Baseline 07/21/2021= 39/56; 08/21/2021=46/56- Will keep goal active to ensure patient able to test consistently; 10/05/2021= 52/56    Time 12    Period Weeks    Status Achieved    Target Date 10/13/21      PT LONG TERM GOAL #3   Title Pt will decrease 5TSTS by at least 5 seconds in order to demonstrate clinically significant improvement in LE strength.    Baseline 07/21/2021= 21 sec without UE Support; 11/21= 14.0 sec without UE support-.Will Keep goal active to ensure consistency.  10/05/2021=14.75 sec without UE support  (improved from 21 sec and unchanged from last assessment of 14 sec)    Time 12    Period Weeks    Status Achieved    Target Date 10/13/21      PT LONG TERM GOAL #4   Title  Pt will decrease TUG to below  18 seconds/decrease in order to demonstrate decreased fall risk.    Baseline 07/21/2021= 23 sec with 4WW; 08/21/2021= 22.13 sec with 4WW; 10/05/2021= 19.51 sec avg with 4WW 2/10: 15.4 seconds with cane  12/14/2021= 18.0 sec; 06/22/2022= 16.95 sec with SPC   Time 12    Period Weeks    Status Achieved    Target Date 01/05/22      PT LONG TERM GOAL #5   Title Pt will increase 10MWT by at least 0.23 m/s in order to demonstrate clinically significant improvement in community ambulation.    Baseline 07/21/2021= 0.63ms; 08/21/2021= 0.57 m/s using 4WW; 10/05/2021= 0.6 m/s avg using 4WW 2/10: 0.51 m/s with cane. 12/14/2021= 0.65 m/s using SPC ; 04/20/22: .599 m/s SPC; 06/22/2022= 0.57 m/s SPC. 07/06/2022= 0.72 m/s (will keep goal active to ensure consistency)    Time 12    Period Weeks    Status ONGOING   Target Date 09/14/2022     PT LONG TERM GOAL #6   Title Patient will increase six minute walk test distance to > 100 for progression to community ambulator and improve gait ability    Baseline 01/05/2022= 495 feet using SPC; 01/29/2022= 470 feet in 4 min 30 sec using SPC ; 04/20/2022: 478'; 570' in 6 min with SPC (several stops due to patient leaning too far forward and out of control) 07/06/2022= 450 feet in 4 min 10 sec yet several stops secondary to walker too far in front and dragging left - Patient became unsafe- leaning too far anteriorly and dragging left LE- unable to self correct or with verbal cues today.    Time 12    Period Weeks    Status On-going    Target Date 09/14/2022   PT LONG TERM GOAL #7  Title Pt will improve DGI by at least 3 points in order to demonstrate clinically significant improvement in balance and decreased risk for falls.  Baseline 06/22/2022-12/24; 07/06/2022= 15/24  Time 12  Period Weeks   Status Progressing.   Target Date 09/14/2022            Plan - 03/23/22 0855     Clinical Impression Statement Patient presents today in good spirits  for his progress visit #70 today. He had mixed results from today's testing. He continues to struggle with advancing his left LE during gait despite max VC and PT stopping patient several time during testing to regroup. He seems at times inattentive to his left side which increases his fall risk. He does better with specific activities- performed well with visual cues for foot placement with heel to toe gait sequencing and actually improved with DGI score indicating improved dynamic standing balance.  Patient's condition has the potential to improve in response to therapy. Maximum improvement is yet to be obtained. The anticipated improvement is attainable and reasonable in a generally predictable time.  Pt will continue to benefit from skilled PT services to improve risk for falls via postural and gait stability and strengthening.     Personal Factors and Comorbidities Comorbidity 3+    Comorbidities A- Fib, Renal cancer, Addison's disease, gout, arthritis, DM    Examination-Activity Limitations Caring for Others;Carry;Dressing;Lift;Reach Overhead;Squat;Stairs;Transfers;Stand    Examination-Participation Restrictions Investment banker, operational;Yard Work;Shop    Stability/Clinical Decision Making Evolving/Moderate complexity    Rehab Potential Good    PT Frequency 1x / week    PT Duration 12 weeks    PT Treatment/Interventions ADLs/Self Care Home Management;Canalith Repostioning;Cryotherapy;Electrical Stimulation;Moist Heat;Traction;Ultrasound;DME Instruction;Gait training;Stair training;Functional mobility training;Therapeutic activities;Therapeutic exercise;Balance training;Neuromuscular re-education;Patient/family education;Orthotic Fit/Training;Wheelchair mobility training;Manual techniques;Passive range of motion;Dry needling;Energy conservation;Taping;Vestibular    PT Next Visit Plan Continue with balance training and LE strengthening for safety with mobility. Please incorporate postural  strengthening due to anterior trunk lean with gait.    PT Home Exercise Plan 08/17/2021-Access Code: [email or web address]; no updates    Consulted and Agree with Plan of Care Patient;Family member/caregiver    Family Member Consulted WIfe- [name]             Ollen Bowl, PT Physical Therapist- [facility]  07/06/2022, 11:25 AM

## 2022-07-12 ENCOUNTER — Encounter: Payer: Self-pay | Admitting: Oncology

## 2022-07-12 ENCOUNTER — Inpatient Hospital Stay: Payer: Medicare Other | Attending: Oncology | Admitting: Oncology

## 2022-07-12 DIAGNOSIS — K8689 Other specified diseases of pancreas: Secondary | ICD-10-CM

## 2022-07-12 DIAGNOSIS — K869 Disease of pancreas, unspecified: Secondary | ICD-10-CM | POA: Insufficient documentation

## 2022-07-12 DIAGNOSIS — C642 Malignant neoplasm of left kidney, except renal pelvis: Secondary | ICD-10-CM

## 2022-07-12 DIAGNOSIS — Z7189 Other specified counseling: Secondary | ICD-10-CM

## 2022-07-12 NOTE — Assessment & Plan Note (Signed)
Discussed with patient

## 2022-07-12 NOTE — Assessment & Plan Note (Signed)
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  Possible duodenal involvement. Patient is not interested in confirming diagnosis with biopsy.

## 2022-07-12 NOTE — Progress Notes (Signed)
Hematology/Oncology Consult Note [phone] [phone]     REFERRING PROVIDER: Tonia Ghent, MD   Patient Care Team: Tonia Ghent, MD as PCP - General (Family Medicine) Gabriel Carina, Betsey Holiday, MD as Physician Assistant (Endocrinology) [name], MD as Referring Physician (Neurology)   ASSESSMENT & PLAN:   Cancer Staging  Renal cell carcinoma, left [facility]) Staging form: Kidney, AJCC 8th Edition - Clinical stage from 03/13/2010: Stage IV (cTX, cNX, pM1) - Signed by Earlie Server, MD on 06/19/2022   Renal cell carcinoma, left ([place]) History of left kidney clear cell carcinoma, with adrenal metastatic disease s/p bilateral adrenalectomy.  Recent images were reviewed.  PET scan images were reviewed and discussed with patient and his partner.  Clinically, I suspect that he has metastatic RCC.  Hilar mass and lung nodules presented on previous CT/PET.  Pancreatic mass was also seen previously.  Soft tissue at left nephrectomy site possible adrenal gland likely representing new metastasis. We discussed the option of EGD with biopsy versus biopsy of the soft tissue at previous nephrectomy site. Patient is not interested due to previous unsuccessful biopsies at outside facilities. Recommend patient to have a discussion with palliative care service.  He declines to be referred to our palliative care service.  Plans to call [place] palliative care service Patient knows to call office if he changes his mind.   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  Possible duodenal involvement. Patient is not interested in confirming diagnosis with biopsy.  Goals of care, counseling/discussion Discussed with patient.    No orders of the defined types were placed in this encounter.  Follow-up to be determined.  Pending patient's decision. All questions were answered. The patient knows to call the clinic with any problems, questions or  concerns.  Earlie Server, MD, PhD [facility] Hematology Oncology 07/12/2022      CHIEF COMPLAINTS/PURPOSE OF CONSULTATION:  Pancreatic mass, history of RCC  HISTORY OF PRESENTING ILLNESS:  Tony Huffman 76 y.o. male presents to establish care for pancreatic mass I have reviewed his chart and materials related to his cancer extensively and collaborated history with the patient. Summary of oncologic history is as follows: Oncology History  Renal cell carcinoma, left ([place])  03/13/2010 Cancer Staging   Staging form: Kidney, AJCC 8th Edition - Clinical stage from 03/13/2010: Stage IV (cTX, cNX, pM1) - Signed by Earlie Server, MD on 06/19/2022 Stage prefix: Initial diagnosis   03/13/2010 Initial Diagnosis   Renal cell carcinoma, left  -He underwent a laparoscopic radical nephrectomy and ipsilateral adrenalectomy (03/2010) Pathology consistent with clear cell renal cell carcinoma, no sarcomatoid features, Fuhrman grade III. The tumor was 7.5 cm without capsular extension. The left adrenal gland contained noncontiguous 0.8 cm focus of metastatic renal cell carcinoma, pathologic stage  pM1  -Following surgery, PET CT showed numerous small areas of enhancement including the right renal mass area and possibly the left first rib and tiny lung nodules that were negative. He was started on Votrient. After 2 months, the PET-avid was recognized as right-sided adrenal gland, not separate in space from the kidney measuring 1.9 x 3.3.  --09/15/10 right, adrenal, FNA with core biopsy-necrotic tumor consistent with chronic metastatic renal cell carcinoma. -11/10/10 right adrenalectomy-metastatic renal cell carcinoma, clear cell, grade 1-2, Fuhrman grade 4, 3.2 cm, no ECE, R 0.  -03/29/11 The very tiny pulmonary nodules, unclear the source of these nodule. He underwent bronchoscopic biopsy of these lesions with multiple passes, all  consistent with benign disease    03/09/2015 Imaging   PET CT skull base to mid thigh done at  [facility] 1. Bilateral small pulmonary nodules are new as compared with the remote PET/CT. None of the nodules demonstrate increased FDG uptake; however, the left lower lobe nodules have increased in size compared with the previous two chest CTs and remain suspicious for metastatic disease.  2. Nonenlarged AP window lymph node demonstrating relatively intense FDG uptake, nonspecific. While this may be reactive, metastatic disease cannot be excluded.    03/15/2016 Imaging   PET CT done at  [facility] 1. New left hilar metabolic lymphadenopathy, 16 mm node present, metastatic versus reactive. Previously hypermetabolic AP window node stable in size no longer hypermetabolic.  2. Multiple bilateral pulmonary nodules the vast majority of which are stable but too small to analyze with FDG PET scan. There is a 4 mm left lower lobe pulmonary nodule which is slightly larger than prior examination, uncertain but doubtful significance    10/29/2016 Imaging   PET CT done at [facility]      1. Persistent hypermetabolic aortopulmonary window and left hilar lymph node. This remains concerning for an indolent metastatic process.  2. New indeterminate 4 mm right lower lobe pulmonary nodule. This is below size threshold for accurate characterization with PET/CT. The remainder of the pulmonary nodules appear unchanged. Consider a short interval follow-up chest CT in 3 months.  3. The indeterminate T7 vertebral body lesion is unchanged.    11/05/2017 Imaging   PET CT done at [facility]  1. Increased size of intensely hypermetabolic left hilar lymph node,concerning for metastasis of either a primary lung malignancy or of the renal cell carcinoma.  2. New and increased size of multiple solid bilateral pulmonary nodules, which are not metabolically active, largest now measuring up to 1.6 cm (prior 1.2 cm). These nodules are suspicious for renal cell metastasis.  3. No evidence of local recurrence or metastatic disease within the abdomen and pelvis.     11/05/2017 Imaging   CT chest abdomen pelvis w contrast at [facility] 1.  Similar appearance of bilateral pulmonary nodules and left hilar mass/lymphadenopathy through February 2019.  2.  Hyperenhancing mass within the region the pancreatic head measuring up to 2.1 cm, concerning for metastatic lesion or less likely neuroendocrine tumor.     07/29/2018 Imaging   CT chest abdomen pelvis w contrast at [facility] 1.  Overall similar size and appearance of bilateral pulmonary metastasis and left hilar mass.  2.  Multiple hyperenhancing masses within the pancreas, the largest within the pancreatic head measuring 2.4 cm in greatest diameter, unchanged and which remain concerning for metastasis.  3.  Nonocclusive thrombus within the IMV just prior to the confluence of the splenic vein, appears slightly improved compared to prior examination.    01/27/2019 Imaging   CT done at [facility]  1.  Bilateral pulmonary nodules are similar to minimally increased and pancreatic head mass has increased size since prior study.  2.  Left hilar mass/lymphadenopathy appears similar to prior study. 3.  Intraluminal filling defect in the IMV appears similar to prior study, this may represent mixing artifact.     06/11/2022 Imaging   CT pancreas Abdomen w wo contrast showed 1. 4.7 x 3.3 cm enhancing mass involving the descending duodenum and region of the ampulla.  2. 2.8 x 2.7 cm left adrenal gland mass likely metastatic disease. 3. Bilateral lower lobe pulmonary nodules consistent with metastatic disease. 4. Status post right adrenalectomy and  left nephrectomy. 5. 10 mm enhancing nodule in the gallbladder could be a gallbladder  polyp.      Patient denies any unintentional weight loss, night sweating, fever, abdominal pain, diarrhea.  Accompanied by wife to discuss the PET scan results and management plan.   Patient has had COVID vaccination and flu vaccination recently and had postvaccination low-grade fever, sore weakness  and headache.   MEDICAL HISTORY:  Past Medical History:  Diagnosis Date   Adrenal insufficiency ([place])    Anxiety    Chronic kidney disease, stage 3 ([place]) 11/13/2011   Erectile dysfunction    GERD (gastroesophageal reflux disease)    Gout 09/04/2013   H/O atrial flutter 03/07/2017   Hyperlipidemia 06/16/2014   Hypertension    Hypothyroidism (acquired) 04/14/2014   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[place]) 11/10/2010   OSA (obstructive sleep apnea) 07/24/2016   Pancreatic mass 08/01/2018   Renal carcinoma ([place]) 06/16/2015   RLS (restless legs syndrome) 07/24/2016   Sleep apnea    Testicular hypofunction    Tussive syncopes 10/15/2013   Type 2 diabetes mellitus ([place])    Vitamin D deficiency disease     SURGICAL HISTORY: Past Surgical History:  Procedure Laterality Date   ADRENALECTOMY     CATARACT EXTRACTION  10/2014   CATARACT EXTRACTION EXTRACAPSULAR  11/05/2016   with Insertion Intraocular Prostheis.    COLONOSCOPY  05/06/2017   with removal lesions by snare.   CYSTECTOMY     Upper Neck/Chest Area, [facility]   NEPHRECTOMY     POPLITEAL SYNOVIAL CYST EXCISION     SPINE SURGERY  1989   THYROIDECTOMY     patient states they only removed half   TONSILLECTOMY      SOCIAL HISTORY: Social History   Socioeconomic History   Marital status: Married    Spouse name: Not on file   Number of children: Not on file   Years of education: Not on file   Highest education level: Not on file  Occupational History   Not on file  Tobacco Use   Smoking status: Former    Packs/day: 1.00    Years: 10.00    Total pack years: 10.00    Types: Cigarettes    Quit date: 70    Years since quitting: 45.8    Passive exposure: Past   Smokeless tobacco: Never  Vaping Use   Vaping Use: Never used  Substance and Sexual Activity   Alcohol use: Not Currently   Drug use: Never   Sexual activity: Not Currently  Other Topics Concern   Not on file  Social History Narrative    Married 2002.   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   Lives at [facility].   Social Determinants of Health   Financial Resource Strain: Not on file  Food Insecurity: Not on file  Transportation Needs: Not on file  Physical Activity: Not on file  Stress: Not on file  Social Connections: Not on file  Intimate Partner Violence: Not on file    FAMILY HISTORY: Family History  Problem Relation Age of Onset   Ovarian cancer Mother    Cancer Mother    Cancer Father    Heart disease Father    Coronary artery disease Father    Hypertension Father    Macular degeneration Father    Heart disease Brother    Diabetes Brother    Coronary artery disease Brother    Diabetes type II Brother    Coronary artery disease  Paternal Grandfather     ALLERGIES:  is allergic to other, tegaderm ag mesh [silver], and tape.  MEDICATIONS:  Current Outpatient Medications  Medication Sig Dispense Refill   Cholecalciferol 125 MCG (5000 UT) capsule Take 5,000 Units by mouth daily.     colchicine 0.6 MG tablet Take 1 tablet (0.6 mg total) by mouth as needed. First sign of Gout Flare. Take 2 tablets ( 1.47m) by mouth at first sign of gout flare followed by 1 tablet ( 0.6229m after 1 hour. ( max 1.29m66mithin 1 hours) 180 tablet 1   cyanocobalamin 1000 MCG tablet Take 1,000 mcg by mouth every other day.     cyclobenzaprine (FLEXERIL) 10 MG tablet Take 1 tablet (10 mg total) by mouth 3 (three) times daily as needed for muscle spasms. 30 tablet 0   Fludrocortisone Acetate (FLORINEF PO) Take 0.1 mg by mouth every other day. On alternate days take 1/2 tablet.     furosemide (LASIX) 20 MG tablet Take 20 mg by mouth as needed. For Edema.     gabapentin (NEURONTIN) 600 MG tablet Take 900 mg by mouth daily. Takes 1 and 1/2 tablet by mouth nightly.     hydrocortisone (CORTEF) 10 MG tablet Take by mouth daily. Takes 1 and 1/2 tablet every morning, 1/2 tablet every afternoon, and 1/2 tablet every evening. Take double  dose as directed for stress, may take up to 85 tablets monthly.     levothyroxine (SYNTHROID) 75 MCG tablet Take 75 mcg by mouth daily.     metFORMIN (GLUMETZA) 500 MG (MOD) 24 hr tablet Take 500 mg by mouth daily.     mirabegron ER (MYRBETRIQ) 50 MG TB24 tablet Take 1 tablet (50 mg total) by mouth daily. 90 tablet 3   Multiple Vitamins-Minerals (PRESERVISION AREDS 2 PO) Take 1 capsule by mouth in the morning and at bedtime.     PSYLLIUM HUSK PO Take 3.4 g by mouth every evening.     Accu-Chek Softclix Lancets lancets Use to check blood sugar once daily. Dx: E11.49 (Patient not taking: Reported on 06/19/2022) 100 each 3   blood glucose meter kit and supplies 1 each by Other route as directed. Dispense based on patient and insurance preference. Use up to four times daily as directed. (FOR ICD-10 E10.9, E11.9). (Patient not taking: Reported on 06/19/2022)     glucose blood (ACCU-CHEK AVIVA PLUS) test strip Use to test blood sugar once daily. Dx: E11.49 (Patient not taking: Reported on 06/19/2022) 100 each 3   No current facility-administered medications for this visit.    Review of Systems  Constitutional:  Negative for appetite change, chills, fatigue, fever and unexpected weight change.  HENT:   Negative for hearing loss and voice change.   Eyes:  Negative for eye problems and icterus.  Respiratory:  Negative for chest tightness, cough and shortness of breath.   Cardiovascular:  Negative for chest pain and leg swelling.  Gastrointestinal:  Negative for abdominal distention, abdominal pain and diarrhea.  Endocrine: Negative for hot flashes.  Genitourinary:  Negative for dysuria.   Musculoskeletal:  Negative for arthralgias.  Skin:  Negative for itching and rash.  Neurological:  Negative for light-headedness and numbness.  Hematological:  Negative for adenopathy. Does not bruise/bleed easily.  Psychiatric/Behavioral:  Negative for confusion.      PHYSICAL EXAMINATION: ECOG PERFORMANCE  STATUS: 1 - Symptomatic but completely ambulatory  Vitals:   07/12/22 1410  BP: 129/81  Pulse: 87  Resp: 18  Temp: (!) 97.5  F (36.4 C)  SpO2: 100%   Filed Weights   07/12/22 1410  Weight: 209 lb 12.8 oz (95.2 kg)    Physical Exam Constitutional:      General: He is not in acute distress.    Appearance: He is obese. He is not diaphoretic.  HENT:     Mouth/Throat:     Pharynx: No oropharyngeal exudate.  Eyes:     General: No scleral icterus. Cardiovascular:     Rate and Rhythm: Normal rate.  Pulmonary:     Effort: Pulmonary effort is normal. No respiratory distress.  Abdominal:     General: There is no distension.  Musculoskeletal:        General: Normal range of motion.     Cervical back: Normal range of motion and neck supple.  Skin:    General: Skin is warm and dry.     Findings: No erythema.  Neurological:     Mental Status: He is alert and oriented to person, place, and time. Mental status is at baseline.     Cranial Nerves: No cranial nerve deficit.     Motor: No abnormal muscle tone.  Psychiatric:        Mood and Affect: Mood and affect normal.      LABORATORY DATA:  I have reviewed the data as listed    Latest Ref Rng & Units 06/19/2022   10:45 AM 05/07/2022   12:00 AM 01/20/2021   12:26 PM  CBC  WBC 4.0 - 10.5 K/uL 8.0  7.4     7.5   Hemoglobin 13.0 - 17.0 g/dL 13.7  14.2     14.6   Hematocrit 39.0 - 52.0 % 41.4  43     44.2   Platelets 150 - 400 K/uL 264  292     304      This result is from an external source.       Latest Ref Rng & Units 06/19/2022   10:45 AM 06/11/2022    2:04 PM 05/07/2022   12:00 AM  CMP  Glucose 70 - 99 mg/dL 181     BUN 8 - 23 mg/dL 24     Creatinine 0.61 - 1.24 mg/dL 0.94  1.10    Sodium 135 - 145 mmol/L 134     Potassium 3.5 - 5.1 mmol/L 4.9     Chloride 98 - 111 mmol/L 103     CO2 22 - 32 mmol/L 25     Calcium 8.9 - 10.3 mg/dL 8.8     Total Protein 6.5 - 8.1 g/dL 7.0     Total Bilirubin 0.3 - 1.2 mg/dL 0.6      Alkaline Phos 38 - 126 U/L 82   83      AST 15 - 41 U/L 25   16      ALT 0 - 44 U/L 22   19         This result is from an external source.      RADIOGRAPHIC STUDIES: I have personally reviewed the radiological images as listed and agreed with the findings in the report. NM PET Image Initial (PI) Skull Base To Thigh  Result Date: 07/06/2022 CLINICAL DATA:  Initial treatment strategy for pancreatic malignancy. EXAM: NUCLEAR MEDICINE PET SKULL BASE TO THIGH TECHNIQUE: 11.81 mCi F-18 FDG was injected intravenously. Full-ring PET imaging was performed from the skull base to thigh after the radiotracer. CT data was obtained and used for attenuation correction and anatomic localization.  Fasting blood glucose: 145 mg/dl COMPARISON:  CT abdomen dated June 11, 2022 FINDINGS: Mediastinal blood pool activity: SUV max 2.0 Liver activity: SUV max 2.7 NECK: No hypermetabolic lymph nodes in the neck. Incidental CT findings: None. CHEST: Enlarged and hypermetabolic left hilar lymph node measuring 2.6 cm in short axis on series 2, image 110 with SUV max of 4.9. bilateral hypermetabolic pulmonary nodules, reference nodule of the right lower lobe measuring 0.8 x 1.4 cm on series 2, image 115 with SUV max of 2.6, not included in the field of view on prior exam. Reference nodule of the left lower lobe measuring 2.0 x 1.6 cm on series 2, image 122 with SUV max of 3.6, unchanged in size when compared with prior. Incidental CT findings: Moderate atherosclerotic disease of the LAD. Normal caliber thoracic aorta with mild calcified plaque. ABDOMEN/PELVIS: Hypermetabolic uptake centered at the wall of the descending duodenum and pancreatic head with soft tissue lesion measuring approximately 2.9 x 1.6 cm on PET-CT, better seen on prior contrast-enhanced abdominal CT, SUV max of 3.6. Hypermetabolic soft tissue nodule at the left nephrectomy site measuring 2.6 x 2.7 cm on series 2, image 52 an SUV max of 3.3, unchanged in  size when compared with prior CT of the abdomen. Incidental CT findings: Status post left nephrectomy and bilateral adrenalectomies. Normal caliber abdominal aorta with mild atherosclerotic disease. SKELETON: No focal hypermetabolic activity to suggest skeletal metastasis. Incidental CT findings: None. IMPRESSION: 1. Hypermetabolic soft tissue lesion centered at the wall of the descending duodenum and pancreatic head, differential considerations include primary malignancy such as duodenal/ampullary or pancreatic, metastatic renal cell carcinoma is also a consideration. 2. Hypermetabolic soft tissue nodule at the left nephrectomy site, concerning for recurrent RCC. 3. Numerous bilateral hypermetabolic pulmonary nodules, consistent with metastatic disease. 4. Enlarged hypermetabolic left hilar lymph node, consistent with metastatic disease. 5. Status post left nephrectomy and bilateral adrenalectomies. 6. Aortic Atherosclerosis (ICD10-I70.0). Electronically Signed   By: Yetta Glassman M.D.   On: 07/06/2022 16:08

## 2022-07-12 NOTE — Progress Notes (Signed)
Covid and flu vaccine on Monday. Reaction on Tuesday (low fever, sore arms, weak, headache)

## 2022-07-12 NOTE — Assessment & Plan Note (Addendum)
History of left kidney clear cell carcinoma, with adrenal metastatic disease s/p bilateral adrenalectomy.  Recent images were reviewed.  PET scan images were reviewed and discussed with patient and his partner.  Clinically, I suspect that he has metastatic RCC.  Hilar mass and lung nodules presented on previous CT/PET.  Pancreatic mass was also seen previously.  Soft tissue at left nephrectomy site likely representing new metastasis. We discussed the option of EGD with biopsy versus biopsy of the soft tissue at previous nephrectomy site. Patient is not interested due to previous unsuccessful biopsies at outside facilities. Recommend patient to have a discussion with palliative care service.  He declines to be referred to our palliative care service.  Plans to call [place] [facility] palliative care service Patient knows to call office if he changes his mind.

## 2022-07-13 ENCOUNTER — Ambulatory Visit: Payer: Medicare Other

## 2022-07-13 DIAGNOSIS — R269 Unspecified abnormalities of gait and mobility: Secondary | ICD-10-CM

## 2022-07-13 DIAGNOSIS — R278 Other lack of coordination: Secondary | ICD-10-CM

## 2022-07-13 DIAGNOSIS — R262 Difficulty in walking, not elsewhere classified: Secondary | ICD-10-CM

## 2022-07-13 DIAGNOSIS — R2689 Other abnormalities of gait and mobility: Secondary | ICD-10-CM

## 2022-07-13 DIAGNOSIS — M6281 Muscle weakness (generalized): Secondary | ICD-10-CM

## 2022-07-13 DIAGNOSIS — R2681 Unsteadiness on feet: Secondary | ICD-10-CM

## 2022-07-13 DIAGNOSIS — R296 Repeated falls: Secondary | ICD-10-CM

## 2022-07-13 NOTE — Therapy (Signed)
OUTPATIENT PHYSICAL THERAPY TREATMENT NOTE     Patient Name: Tony Huffman MRN: [id number] DOB:October 21, 1945, 76 y.o., male Today's Date: 07/13/2022  PCP: Sherrie Mustache, NP REFERRING PROVIDER: Dr. Jennings Books   PT End of Session - 07/13/22 0858     Visit Number 36    Number of Visits 51    Date for PT Re-Evaluation 09/14/22    Authorization Type Medicare Part A & B; BCBS Secondary    Authorization Time Period 03/30/22-06/22/22; 06/22/2022- 09/14/2022    Progress Note Due on Visit 19    PT Start Time 0845    PT Stop Time 0930    PT Time Calculation (min) 45 min    Equipment Utilized During Treatment Gait belt    Activity Tolerance Patient tolerated treatment well;No increased pain    Behavior During Therapy [facility] for tasks assessed/performed                 Past Medical History:  Diagnosis Date   Adrenal insufficiency ([place])    Anxiety    Chronic kidney disease, stage 3 ([place]) 11/13/2011   Erectile dysfunction    GERD (gastroesophageal reflux disease)    Gout 09/04/2013   H/O atrial flutter 03/07/2017   Hyperlipidemia 06/16/2014   Hypertension    Hypothyroidism (acquired) 04/14/2014   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[place]) 11/10/2010   OSA (obstructive sleep apnea) 07/24/2016   Pancreatic mass 08/01/2018   Renal carcinoma ([place]) 06/16/2015   RLS (restless legs syndrome) 07/24/2016   Sleep apnea    Testicular hypofunction    Tussive syncopes 10/15/2013   Type 2 diabetes mellitus ([place])    Vitamin D deficiency disease    Past Surgical History:  Procedure Laterality Date   ADRENALECTOMY     CATARACT EXTRACTION  10/2014   CATARACT EXTRACTION EXTRACAPSULAR  11/05/2016   with Insertion Intraocular Prostheis.    COLONOSCOPY  05/06/2017   with removal lesions by snare.   CYSTECTOMY     Upper Neck/Chest Area, [facility]   NEPHRECTOMY     POPLITEAL SYNOVIAL CYST EXCISION     SPINE SURGERY  1989   THYROIDECTOMY     patient states they only removed half    TONSILLECTOMY     Patient Active Problem List   Diagnosis Date Noted   Renal cell carcinoma, left ([place]) 06/19/2022   Other abnormal tumor markers 06/19/2022   Goals of care, counseling/discussion 06/19/2022   Advance care planning 05/27/2022   Gait abnormality 05/27/2022   Diabetes mellitus without complication ([place]) 53/61/4431   Addisons disease ([place]) 03/16/2021   Low HDL (under 40) 03/16/2021   Numbness and tingling 11/21/2020   Pancreatic mass 08/01/2018   H/O sebaceous cyst 01/06/2018   Edema leg 03/07/2017   OSA (obstructive sleep apnea) 07/24/2016   RLS (restless legs syndrome) 07/24/2016   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   Atrial fibrillation and flutter ([place]) 10/20/2014   Hyperlipidemia 06/16/2014   Cough syncope 10/15/2013   Dyslipidemia 09/04/2013   Gout 09/04/2013   Chronic kidney disease, stage III (moderate) ([place]) 11/13/2011   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   Hypothyroidism (acquired) 11/30/2010   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[place]) 11/10/2010    REFERRING DIAG: Unspecified abnormalities of gait and mobility  THERAPY DIAG:  Abnormality of gait and mobility  Difficulty in walking, not elsewhere classified  Muscle weakness (generalized)  Other abnormalities of gait and mobility  Other lack of coordination  Repeated falls  Unsteadiness on feet  Rationale for  Evaluation and Treatment Rehabilitation  PERTINENT HISTORY: Patient is a 76 year old male with referral from Neurology- Dr. Joselyn Arrow for unspecified abnormality of gait. He presents today with his wife and reports > 6 month history of progressive issues with poor balance and multiple falls. Patient has renal Cancer and currently has Pallative care- Was stabilized out of Hospice. Patient presents with the following past medical history: A-Fib and flutter, Lung Cancer, Obstructive sleep apnea, Upper respiratory infection, Addison's disease, Hypothyroidism, Renal  Cancer, DM, gout, arthritis, Chronic kidney disease and Restless leg syndrome. Patient lives with wife in independent living at [facility]. Prior level of function was independent with transfers and mobility using walker yet some assist with dressing.   PRECAUTIONS: falls  SUBJECTIVE:     Patient reports fall on Tues after receiving vaccine the day before. He reports he went into adrenal crisis and became very weak and fell in dining room with rollator and tipped causing fall. He denied any injury and did not seek medical attention  PAIN:  Are you having pain? No     TODAY'S TREATMENT: 07/13/22    Therex:   Sit to stand with arms outstretched- holding onto rainbow colored ball  and performed overhead flex during stand x 15 reps (patient reported as medium yet completed without failure or LOB).     Neuromuscular re-ed:   Step tap onto 1/2 foam without UE support x 20 reps each LE  Practice SLS- multiple attempts each Leg- Patient at best able to stand on right LE 18 sec and left x 10 sec  Tandem standing on 1/2 foam (curve side up) - multiple attempts- best effort x 20 sec with left foot in front and 15 sec with Right LE in front)   Tandem standing on firm surface (30 sec with left LE in front and 27 sec with right LE in front) x 2 sets    Kick ball activities (to focus on coordination of using Left LE) -Patient utilized wearing 5# AW on left LE for the following   - seated kick ball in // bars - PT rolled ball to patient- patient had to kick ball with left LE focusing on reaction and purposeful movement x 20 reps   -Standing kick ball in bars- PT rolled ball to patient x 20 reps   - Standing kick ball with right LE positioned in front x 20 reps   -Standing kick ball with reactive step forward with right LE and ball rolled toward him and reactive swing through/kick with left LE x 20 reps (patient utilized BUE support on railings) no LOB and 100% ability to kick ball with step although  force of swing varied.      PATIENT EDUCATION: Education details: exercise technique.  Person educated: Patient Education method: Explanation, Demonstration, Tactile cues, and Verbal cues Education comprehension: verbalized understanding, returned demonstration, verbal cues required, tactile cues required, and needs further education   HOME EXERCISE PROGRAM:  Access Code: [email or web address] URL: [email or web address] Date: 05/04/2022 Prepared by: Sande Brothers  Exercises - Sidelying Thoracic Rotation with Open Book  - 1 x daily - 3 sets - 10 reps - Supine Lower Trunk Rotation  - 1 x daily - 3 sets - 10 reps - Quadruped Full Range Thoracic Rotation with Reach  - 1 x daily - 3 sets - 10 reps    PT Short Term Goals -       PT SHORT TERM GOAL #1   Title Pt will be  independent with HEP in order to improve strength and balance in order to decrease fall risk and improve function at home and work.    Baseline 07/21/2021- Paitent presents with no formal HEP in place. 08/21/2021- Patient reports compliance with HEP and states no questions as of today- able to perform standin using his rollator for support.    Time 6    Period Weeks    Status Achieved    Target Date 09/01/21              PT Long Term Goals -      PT LONG TERM GOAL #1   Title Pt will improve FOTO to target score of 53  to display perceived improvements in ability to complete ADL's.    Baseline 07/21/2021= 45; 1/5=51. 10/12/2021- Just reassessed for progress note so did not reassess again- will continue with goal into new certification 2/54: 27% 01/07/2022= 59%   Time 12    Period Weeks    Status Achieved    Target Date 03/30/22      PT LONG TERM GOAL #2   Title Pt will improve BERG by at least 5 points in order to demonstrate clinically significant improvement in balance.    Baseline 07/21/2021= 39/56; 08/21/2021=46/56- Will keep goal active to ensure patient able to test consistently; 10/05/2021= 52/56     Time 12    Period Weeks    Status Achieved    Target Date 10/13/21      PT LONG TERM GOAL #3   Title Pt will decrease 5TSTS by at least 5 seconds in order to demonstrate clinically significant improvement in LE strength.    Baseline 07/21/2021= 21 sec without UE Support; 11/21= 14.0 sec without UE support-.Will Keep goal active to ensure consistency.  10/05/2021=14.75 sec without UE support  (improved from 21 sec and unchanged from last assessment of 14 sec)    Time 12    Period Weeks    Status Achieved    Target Date 10/13/21      PT LONG TERM GOAL #4   Title Pt will decrease TUG to below  18 seconds/decrease in order to demonstrate decreased fall risk.    Baseline 07/21/2021= 23 sec with 4WW; 08/21/2021= 22.13 sec with 4WW; 10/05/2021= 19.51 sec avg with 4WW 2/10: 15.4 seconds with cane  12/14/2021= 18.0 sec; 06/22/2022= 16.95 sec with SPC   Time 12    Period Weeks    Status Achieved    Target Date 01/05/22      PT LONG TERM GOAL #5   Title Pt will increase 10MWT by at least 0.23 m/s in order to demonstrate clinically significant improvement in community ambulation.    Baseline 07/21/2021= 0.92ms; 08/21/2021= 0.57 m/s using 4WW; 10/05/2021= 0.6 m/s avg using 4WW 2/10: 0.51 m/s with cane. 12/14/2021= 0.65 m/s using SPC ; 04/20/22: .599 m/s SPC; 06/22/2022= 0.57 m/s SPC. 07/06/2022= 0.72 m/s (will keep goal active to ensure consistency)    Time 12    Period Weeks    Status ONGOING   Target Date 09/14/2022     PT LONG TERM GOAL #6   Title Patient will increase six minute walk test distance to > 100 for progression to community ambulator and improve gait ability    Baseline 01/05/2022= 495 feet using SPC; 01/29/2022= 470 feet in 4 min 30 sec using SPC ; 04/20/2022: 478'; 570' in 6 min with SPC (several stops due to patient leaning too far forward and out of control) 07/06/2022=  450 feet in 4 min 10 sec yet several stops secondary to walker too far in front and dragging left - Patient became unsafe-  leaning too far anteriorly and dragging left LE- unable to self correct or with verbal cues today.    Time 12    Period Weeks    Status On-going    Target Date 09/14/2022   PT LONG TERM GOAL #7  Title Pt will improve DGI by at least 3 points in order to demonstrate clinically significant improvement in balance and decreased risk for falls.  Baseline 06/22/2022-12/24; 07/06/2022= 15/24  Time 12   Period Weeks   Status Progressing.   Target Date 09/14/2022            Plan - 03/23/22 0855     Clinical Impression Statement Patient presents today with excellent motivation for today's session. He vastly improved with ability to perform SLS and tandem stand today vs. Previous visits. He was able to step reaction with kick ball and demo much improved left side attention today. Pt will continue to benefit from skilled PT services to improve risk for falls via postural and gait stability and strengthening.     Personal Factors and Comorbidities Comorbidity 3+    Comorbidities A- Fib, Renal cancer, Addison's disease, gout, arthritis, DM    Examination-Activity Limitations Caring for Others;Carry;Dressing;Lift;Reach Overhead;Squat;Stairs;Transfers;Stand    Examination-Participation Restrictions Investment banker, operational;Yard Work;Shop    Stability/Clinical Decision Making Evolving/Moderate complexity    Rehab Potential Good    PT Frequency 1x / week    PT Duration 12 weeks    PT Treatment/Interventions ADLs/Self Care Home Management;Canalith Repostioning;Cryotherapy;Electrical Stimulation;Moist Heat;Traction;Ultrasound;DME Instruction;Gait training;Stair training;Functional mobility training;Therapeutic activities;Therapeutic exercise;Balance training;Neuromuscular re-education;Patient/family education;Orthotic Fit/Training;Wheelchair mobility training;Manual techniques;Passive range of motion;Dry needling;Energy conservation;Taping;Vestibular    PT Next Visit Plan Continue with balance  training and LE strengthening for safety with mobility. Please incorporate postural strengthening due to anterior trunk lean with gait.    PT Home Exercise Plan 08/17/2021-Access Code: [email or web address]; no updates    Consulted and Agree with Plan of Care Patient;Family member/caregiver    Family Member Consulted WIfe- [name]             Ollen Bowl, PT Physical Therapist- [facility]  07/13/2022, 9:47 AM

## 2022-07-18 ENCOUNTER — Inpatient Hospital Stay (HOSPITAL_BASED_OUTPATIENT_CLINIC_OR_DEPARTMENT_OTHER): Payer: Medicare Other | Admitting: Hospice and Palliative Medicine

## 2022-07-18 DIAGNOSIS — C642 Malignant neoplasm of left kidney, except renal pelvis: Secondary | ICD-10-CM

## 2022-07-18 NOTE — Progress Notes (Signed)
Multidisciplinary Oncology Council Documentation  Zylon Creamer was presented by our [facility] on 07/18/2022, which included representatives from:  Palliative Care Dietitian  Physical/Occupational Therapist Nurse Navigator Genetics Speech Therapist Social work Survivorship RN Financial Navigator Research RN   Benz currently presents with history of RCC  We reviewed previous medical and familial history, history of present illness, and recent lab results along with all available histopathologic and imaging studies. The [place] considered available treatment options and made the following recommendations/referrals:  Recommend palliative care, SW, genetics  The MOC is a meeting of clinicians from various specialty areas who evaluate and discuss patients for whom a multidisciplinary approach is being considered. Final determinations in the plan of care are those of the provider(s).   Today's extended care, comprehensive team conference, Cashus was not present for the discussion and was not examined.

## 2022-07-19 ENCOUNTER — Ambulatory Visit: Payer: Medicare Other | Admitting: Nurse Practitioner

## 2022-07-20 ENCOUNTER — Ambulatory Visit: Payer: Medicare Other

## 2022-07-20 DIAGNOSIS — M6281 Muscle weakness (generalized): Secondary | ICD-10-CM

## 2022-07-20 DIAGNOSIS — R296 Repeated falls: Secondary | ICD-10-CM

## 2022-07-20 DIAGNOSIS — R278 Other lack of coordination: Secondary | ICD-10-CM

## 2022-07-20 DIAGNOSIS — R269 Unspecified abnormalities of gait and mobility: Secondary | ICD-10-CM

## 2022-07-20 DIAGNOSIS — R2681 Unsteadiness on feet: Secondary | ICD-10-CM

## 2022-07-20 DIAGNOSIS — R262 Difficulty in walking, not elsewhere classified: Secondary | ICD-10-CM

## 2022-07-20 DIAGNOSIS — R2689 Other abnormalities of gait and mobility: Secondary | ICD-10-CM

## 2022-07-20 NOTE — Therapy (Signed)
OUTPATIENT PHYSICAL THERAPY TREATMENT NOTE     Patient Name: Tony Huffman MRN: [id number] DOB:[birth year], 76 y.o., male Today's Date: 07/20/2022  PCP: Sherrie Mustache, NP REFERRING PROVIDER: Dr. Jennings Books   PT End of Session - 07/20/22 0839     Visit Number 72    Number of Visits 59    Date for PT Re-Evaluation 09/14/22    Authorization Type Medicare Part A & B; BCBS Secondary    Authorization Time Period 03/30/22-06/22/22; 06/22/2022- 09/14/2022    Progress Note Due on Visit 80    PT Start Time 0841    PT Stop Time 0927    PT Time Calculation (min) 46 min    Equipment Utilized During Treatment Gait belt    Activity Tolerance Patient tolerated treatment well;No increased pain    Behavior During Therapy [facility] for tasks assessed/performed                 Past Medical History:  Diagnosis Date   Adrenal insufficiency ([place])    Anxiety    Chronic kidney disease, stage 3 ([place]) 11/13/2011   Erectile dysfunction    GERD (gastroesophageal reflux disease)    Gout 09/04/2013   H/O atrial flutter 03/07/2017   Hyperlipidemia 06/16/2014   Hypertension    Hypothyroidism (acquired) 04/14/2014   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[place]) 11/10/2010   OSA (obstructive sleep apnea) 07/24/2016   Pancreatic mass 08/01/2018   Renal carcinoma ([place]) 06/16/2015   RLS (restless legs syndrome) 07/24/2016   Sleep apnea    Testicular hypofunction    Tussive syncopes 10/15/2013   Type 2 diabetes mellitus ([place])    Vitamin D deficiency disease    Past Surgical History:  Procedure Laterality Date   ADRENALECTOMY     CATARACT EXTRACTION  10/2014   CATARACT EXTRACTION EXTRACAPSULAR  11/05/2016   with Insertion Intraocular Prostheis.    COLONOSCOPY  05/06/2017   with removal lesions by snare.   CYSTECTOMY     Upper Neck/Chest Area, [facility]   NEPHRECTOMY     POPLITEAL SYNOVIAL CYST EXCISION     SPINE SURGERY  1989   THYROIDECTOMY     patient states they only removed half    TONSILLECTOMY     Patient Active Problem List   Diagnosis Date Noted   Renal cell carcinoma, left ([place]) 06/19/2022   Other abnormal tumor markers 06/19/2022   Goals of care, counseling/discussion 06/19/2022   Advance care planning 05/27/2022   Gait abnormality 05/27/2022   Diabetes mellitus without complication ([place]) 75/91/6384   Addisons disease ([place]) 03/16/2021   Low HDL (under 40) 03/16/2021   Numbness and tingling 11/21/2020   Pancreatic mass 08/01/2018   H/O sebaceous cyst 01/06/2018   Edema leg 03/07/2017   OSA (obstructive sleep apnea) 07/24/2016   RLS (restless legs syndrome) 07/24/2016   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   Atrial fibrillation and flutter ([place]) 10/20/2014   Hyperlipidemia 06/16/2014   Cough syncope 10/15/2013   Dyslipidemia 09/04/2013   Gout 09/04/2013   Chronic kidney disease, stage III (moderate) ([place]) 11/13/2011   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   Hypothyroidism (acquired) 11/30/2010   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[place]) 11/10/2010    REFERRING DIAG: Unspecified abnormalities of gait and mobility  THERAPY DIAG:  Abnormality of gait and mobility  Difficulty in walking, not elsewhere classified  Muscle weakness (generalized)  Other abnormalities of gait and mobility  Other lack of coordination  Repeated falls  Unsteadiness on feet  Rationale for  Evaluation and Treatment Rehabilitation  PERTINENT HISTORY: Patient is a 76 year old male with referral from Neurology- Dr. Joselyn Arrow for unspecified abnormality of gait. He presents today with his wife and reports > 6 month history of progressive issues with poor balance and multiple falls. Patient has renal Cancer and currently has Pallative care- Was stabilized out of Hospice. Patient presents with the following past medical history: A-Fib and flutter, Lung Cancer, Obstructive sleep apnea, Upper respiratory infection, Addison's disease, Hypothyroidism, Renal  Cancer, DM, gout, arthritis, Chronic kidney disease and Restless leg syndrome. Patient lives with wife in independent living at [facility]. Prior level of function was independent with transfers and mobility using walker yet some assist with dressing.   PRECAUTIONS: falls  SUBJECTIVE:   Patient reports doing well this week with no further falls and states no further reaction from recent injection. Reports wife is out of town in [place] and he plans to take it easy this week.     PAIN:  Are you having pain? No     TODAY'S TREATMENT: 07/20/22    Therex:   Nustep: Interval training LE only with VC to keep above 50 SPM Level 1 for 1 min Level 4 for 30 sec Level 1 for 1 min Level 6 for 30 sec Level 1 for 1 min Level 6 for 30 sec Level 1 for 1 min Level 6 for 30 sec   Sit to stand with arms outstretched- holding onto rainbow colored ball  and performed overhead flex during stand x 15 reps (patient reported as medium yet completed without failure or LOB).     Neuromuscular re-ed:     Walking in //bars - using 5lb AW on Left LE with initially both hands on rails down and back- 2 trips. Progressed to 1 UE support down and back x 3  High knee marching in // bars - (PT held a 1/2 foam roll at desired height and patient had to touch the foam with his knee- to give feedback on height of march)   Walking with ham curl (butt kicks) with 5lb AW in // bars - down and back x 2 trials  Side stepping in // bars- 5lb AW   Resistive retro Gait using matrix cable system '@12'$ .5 lb - x 5 times with use of 5lb AW on Left LE then 5 more without AW- Much improved step length.      Practice SLS- multiple attempts each Leg- Patient at best able to stand on right LE 20 sec and left x 5 sec  Tandem standing on firm surface (30 sec with left LE in front and 27 sec with right LE in front) x 2 sets     Walking in clinic with [facility], CGA and use of gait belt x 175 feet. Patient initially performed with good  reciprocal step yet reverts to dragging left LE after approx 75 feet- requiring cues to stop, sigh, shift, and then large step with left x multiple times during last 100 feet with good results and improved safety.       PATIENT EDUCATION: Education details: exercise technique.  Person educated: Patient Education method: Explanation, Demonstration, Tactile cues, and Verbal cues Education comprehension: verbalized understanding, returned demonstration, verbal cues required, tactile cues required, and needs further education   HOME EXERCISE PROGRAM:  Access Code: [email or web address] URL: [email or web address] Date: 05/04/2022 Prepared by: Sande Brothers  Exercises - Sidelying Thoracic Rotation with Open Book  - 1 x daily - 3 sets - 10  reps - Supine Lower Trunk Rotation  - 1 x daily - 3 sets - 10 reps - Quadruped Full Range Thoracic Rotation with Reach  - 1 x daily - 3 sets - 10 reps    PT Short Term Goals -       PT SHORT TERM GOAL #1   Title Pt will be independent with HEP in order to improve strength and balance in order to decrease fall risk and improve function at home and work.    Baseline 07/21/2021- Paitent presents with no formal HEP in place. 08/21/2021- Patient reports compliance with HEP and states no questions as of today- able to perform standin using his rollator for support.    Time 6    Period Weeks    Status Achieved    Target Date 09/01/21              PT Long Term Goals -      PT LONG TERM GOAL #1   Title Pt will improve FOTO to target score of 53  to display perceived improvements in ability to complete ADL's.    Baseline 07/21/2021= 45; 1/5=51. 10/12/2021- Just reassessed for progress note so did not reassess again- will continue with goal into new certification 3/55: 73% 01/07/2022= 59%   Time 12    Period Weeks    Status Achieved    Target Date 03/30/22      PT LONG TERM GOAL #2   Title Pt will improve BERG by at least 5 points in order to  demonstrate clinically significant improvement in balance.    Baseline 07/21/2021= 39/56; 08/21/2021=46/56- Will keep goal active to ensure patient able to test consistently; 10/05/2021= 52/56    Time 12    Period Weeks    Status Achieved    Target Date 10/13/21      PT LONG TERM GOAL #3   Title Pt will decrease 5TSTS by at least 5 seconds in order to demonstrate clinically significant improvement in LE strength.    Baseline 07/21/2021= 21 sec without UE Support; 11/21= 14.0 sec without UE support-.Will Keep goal active to ensure consistency.  10/05/2021=14.75 sec without UE support  (improved from 21 sec and unchanged from last assessment of 14 sec)    Time 12    Period Weeks    Status Achieved    Target Date 10/13/21      PT LONG TERM GOAL #4   Title Pt will decrease TUG to below  18 seconds/decrease in order to demonstrate decreased fall risk.    Baseline 07/21/2021= 23 sec with 4WW; 08/21/2021= 22.13 sec with 4WW; 10/05/2021= 19.51 sec avg with 4WW 2/10: 15.4 seconds with cane  12/14/2021= 18.0 sec; 06/22/2022= 16.95 sec with SPC   Time 12    Period Weeks    Status Achieved    Target Date 01/05/22      PT LONG TERM GOAL #5   Title Pt will increase 10MWT by at least 0.23 m/s in order to demonstrate clinically significant improvement in community ambulation.    Baseline 07/21/2021= 0.39ms; 08/21/2021= 0.57 m/s using 4WW; 10/05/2021= 0.6 m/s avg using 4WW 2/10: 0.51 m/s with cane. 12/14/2021= 0.65 m/s using SPC ; 04/20/22: .599 m/s SPC; 06/22/2022= 0.57 m/s SPC. 07/06/2022= 0.72 m/s (will keep goal active to ensure consistency)    Time 12    Period Weeks    Status ONGOING   Target Date 09/14/2022     PT LONG TERM GOAL #6   Title Patient  will increase six minute walk test distance to > 100 for progression to community ambulator and improve gait ability    Baseline 01/05/2022= 495 feet using SPC; 01/29/2022= 470 feet in 4 min 30 sec using SPC ; 04/20/2022: 478'; 570' in 6 min with SPC (several stops  due to patient leaning too far forward and out of control) 07/06/2022= 450 feet in 4 min 10 sec yet several stops secondary to walker too far in front and dragging left - Patient became unsafe- leaning too far anteriorly and dragging left LE- unable to self correct or with verbal cues today.    Time 12    Period Weeks    Status On-going    Target Date 09/14/2022   PT LONG TERM GOAL #7  Title Pt will improve DGI by at least 3 points in order to demonstrate clinically significant improvement in balance and decreased risk for falls.  Baseline 06/22/2022-12/24; 07/06/2022= 15/24  Time 12   Period Weeks   Status Progressing.   Target Date 09/14/2022            Plan - 03/23/22 0855     Clinical Impression Statement Patient presents today with excellent motivation for today's session. He continues to exhibit inattention to left side with increased gait yet able to perform well inside of bars with improved step length and attention with tasks involving 5 lb AW today. He did not have any LOB with any activity outside of walking but still unable to stop dragging Left LE with max VC. He did improve his safety by practicing 4s's strategy- stop/sigh/shift/step. Pt will continue to benefit from skilled PT services to improve risk for falls via postural and gait stability and strengthening.     Personal Factors and Comorbidities Comorbidity 3+    Comorbidities A- Fib, Renal cancer, Addison's disease, gout, arthritis, DM    Examination-Activity Limitations Caring for Others;Carry;Dressing;Lift;Reach Overhead;Squat;Stairs;Transfers;Stand    Examination-Participation Restrictions Investment banker, operational;Yard Work;Shop    Stability/Clinical Decision Making Evolving/Moderate complexity    Rehab Potential Good    PT Frequency 1x / week    PT Duration 12 weeks    PT Treatment/Interventions ADLs/Self Care Home Management;Canalith Repostioning;Cryotherapy;Electrical Stimulation;Moist  Heat;Traction;Ultrasound;DME Instruction;Gait training;Stair training;Functional mobility training;Therapeutic activities;Therapeutic exercise;Balance training;Neuromuscular re-education;Patient/family education;Orthotic Fit/Training;Wheelchair mobility training;Manual techniques;Passive range of motion;Dry needling;Energy conservation;Taping;Vestibular    PT Next Visit Plan Continue with balance training and LE strengthening for safety with mobility. Please incorporate postural strengthening due to anterior trunk lean with gait.    PT Home Exercise Plan 08/17/2021-Access Code: [email or web address]; no updates    Consulted and Agree with Plan of Care Patient;Family member/caregiver    Family Member Consulted WIfe- [name]             Ollen Bowl, PT Physical Therapist- [facility]  07/20/2022, 11:14 AM

## 2022-07-26 ENCOUNTER — Encounter: Payer: Self-pay | Admitting: Licensed Clinical Social Worker

## 2022-07-26 DIAGNOSIS — C7802 Secondary malignant neoplasm of left lung: Secondary | ICD-10-CM

## 2022-07-26 DIAGNOSIS — C642 Malignant neoplasm of left kidney, except renal pelvis: Secondary | ICD-10-CM

## 2022-07-26 NOTE — Progress Notes (Signed)
[facility]  Clinical Social Work was referred by medical provider for assessment of psychosocial needs.  Clinical Social Worker attempted to contact patient by phone  to offer support and assess for needs.  CSW called patient's main contact number, it was unable to connect.  CSW left voicemail for patient's spouse, with contact information and request for a return call.    FA   Adelene Amas, LCSW  Clinical Social Worker [facility]

## 2022-07-27 ENCOUNTER — Ambulatory Visit: Payer: Medicare Other

## 2022-07-27 DIAGNOSIS — R278 Other lack of coordination: Secondary | ICD-10-CM

## 2022-07-27 DIAGNOSIS — R2681 Unsteadiness on feet: Secondary | ICD-10-CM

## 2022-07-27 DIAGNOSIS — C7931 Secondary malignant neoplasm of brain: Secondary | ICD-10-CM | POA: Diagnosis not present

## 2022-07-27 DIAGNOSIS — R4182 Altered mental status, unspecified: Secondary | ICD-10-CM | POA: Diagnosis not present

## 2022-07-27 DIAGNOSIS — R269 Unspecified abnormalities of gait and mobility: Secondary | ICD-10-CM

## 2022-07-27 DIAGNOSIS — M6281 Muscle weakness (generalized): Secondary | ICD-10-CM

## 2022-07-27 DIAGNOSIS — R296 Repeated falls: Secondary | ICD-10-CM

## 2022-07-27 DIAGNOSIS — R2689 Other abnormalities of gait and mobility: Secondary | ICD-10-CM

## 2022-07-27 DIAGNOSIS — R262 Difficulty in walking, not elsewhere classified: Secondary | ICD-10-CM

## 2022-07-27 NOTE — Therapy (Signed)
OUTPATIENT PHYSICAL THERAPY TREATMENT NOTE     Patient Name: Tony Huffman MRN: [id number] DOB:1946-08-24, 76 y.o., male Today's Date: 07/27/2022  PCP: Sherrie Mustache, NP REFERRING PROVIDER: Dr. Jennings Books   PT End of Session - 07/27/22 0842     Visit Number 36    Number of Visits 34    Date for PT Re-Evaluation 09/14/22    Authorization Type Medicare Part A & B; BCBS Secondary    Authorization Time Period 03/30/22-06/22/22; 06/22/2022- 09/14/2022    Progress Note Due on Visit 80    PT Start Time 0838    PT Stop Time 0929    PT Time Calculation (min) 51 min    Equipment Utilized During Treatment Gait belt    Activity Tolerance Patient tolerated treatment well;No increased pain    Behavior During Therapy [facility] for tasks assessed/performed                  Past Medical History:  Diagnosis Date   Adrenal insufficiency ([place])    Anxiety    Chronic kidney disease, stage 3 ([place]) 11/13/2011   Erectile dysfunction    GERD (gastroesophageal reflux disease)    Gout 09/04/2013   H/O atrial flutter 03/07/2017   Hyperlipidemia 06/16/2014   Hypertension    Hypothyroidism (acquired) 04/14/2014   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[place]) 11/10/2010   OSA (obstructive sleep apnea) 07/24/2016   Pancreatic mass 08/01/2018   Renal carcinoma ([place]) 06/16/2015   RLS (restless legs syndrome) 07/24/2016   Sleep apnea    Testicular hypofunction    Tussive syncopes 10/15/2013   Type 2 diabetes mellitus ([place])    Vitamin D deficiency disease    Past Surgical History:  Procedure Laterality Date   ADRENALECTOMY     CATARACT EXTRACTION  10/2014   CATARACT EXTRACTION EXTRACAPSULAR  11/05/2016   with Insertion Intraocular Prostheis.    COLONOSCOPY  05/06/2017   with removal lesions by snare.   CYSTECTOMY     Upper Neck/Chest Area, [facility]   NEPHRECTOMY     POPLITEAL SYNOVIAL CYST EXCISION     SPINE SURGERY  1989   THYROIDECTOMY     patient states they only removed  half   TONSILLECTOMY     Patient Active Problem List   Diagnosis Date Noted   Renal cell carcinoma, left ([place]) 06/19/2022   Other abnormal tumor markers 06/19/2022   Goals of care, counseling/discussion 06/19/2022   Advance care planning 05/27/2022   Gait abnormality 05/27/2022   Diabetes mellitus without complication ([place]) 70/35/0093   Addisons disease ([place]) 03/16/2021   Low HDL (under 40) 03/16/2021   Numbness and tingling 11/21/2020   Pancreatic mass 08/01/2018   H/O sebaceous cyst 01/06/2018   Edema leg 03/07/2017   OSA (obstructive sleep apnea) 07/24/2016   RLS (restless legs syndrome) 07/24/2016   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   Atrial fibrillation and flutter ([place]) 10/20/2014   Hyperlipidemia 06/16/2014   Cough syncope 10/15/2013   Dyslipidemia 09/04/2013   Gout 09/04/2013   Chronic kidney disease, stage III (moderate) ([place]) 11/13/2011   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   Hypothyroidism (acquired) 11/30/2010   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[place]) 11/10/2010    REFERRING DIAG: Unspecified abnormalities of gait and mobility  THERAPY DIAG:  Abnormality of gait and mobility  Difficulty in walking, not elsewhere classified  Muscle weakness (generalized)  Other abnormalities of gait and mobility  Other lack of coordination  Repeated falls  Unsteadiness on feet  Rationale  for Evaluation and Treatment Rehabilitation  PERTINENT HISTORY: Patient is a 76 year old male with referral from Neurology- Dr. Joselyn Arrow for unspecified abnormality of gait. He presents today with his wife and reports > 6 month history of progressive issues with poor balance and multiple falls. Patient has renal Cancer and currently has Pallative care- Was stabilized out of Hospice. Patient presents with the following past medical history: A-Fib and flutter, Lung Cancer, Obstructive sleep apnea, Upper respiratory infection, Addison's disease, Hypothyroidism, Renal  Cancer, DM, gout, arthritis, Chronic kidney disease and Restless leg syndrome. Patient lives with wife in independent living at [facility]. Prior level of function was independent with transfers and mobility using walker yet some assist with dressing.   PRECAUTIONS: falls  SUBJECTIVE:   Patient reports having a good week overall- Denies any pain, changes in meds, or falls. Reports continuing to use cane for most mobility except at night- using his walker.    PAIN:  Are you having pain? No     TODAY'S TREATMENT: 07/20/22    Therex:   Nustep: Interval training LE only with VC to keep above 60 SPM Level 1 for 1 min Level 3 for 3 min Level 1 for 1 min Level 4 for 1 min  Total distance= 0.19 mi (improving ability to keep SPM closer to 60 today vs. Previous visits)   Sit to stand with arms outstretched- holding onto 2kg yellow ball  and performed overhead flex during stand x 12 reps (patient reported as hard- near failure level).     Neuromuscular re-ed:    Heel to toe gait sequencing activity with 2 cones positioned in // bars- using 5lb AW- step forward/back to respective cones (used as feedback for adequate step length x 15 reps each LE.   Walking in //bars - using 5lb AW on B LE with initially both hands on rails down and back- 2 trips. Progressed to 1 UE support down and back x 3  High knee marching in // bars - (PT held a 1/2 foam roll at desired height and patient had to touch the foam with his knee- to give feedback on height of march) with 5lb AW -down and back- x 4 (Difficult on left side)   Side stepping in // bars- 5lb AW - down and back x 4 (6 steps to right avg and 9-10 steps to left)   Side step and reach- up/diagonal to touch sticky notes positioned on right/Left x 12 reps each.    Resistive froward/retro Gait x 10 times with use of 5lb AW on B LE and right UE support in // bars- down and back (approx 5-6 steps forward- 7-9 steps back)       Walking in clinic with  [facility], CGA and use of gait belt x 175 feet. Patient initially performed with good reciprocal step yet reverts to dragging left LE after approx 75 feet- requiring cues to stop, sigh, shift, and then large step with left x multiple times during last 100 feet with good results and improved safety.       PATIENT EDUCATION: Education details: exercise technique.  Person educated: Patient Education method: Explanation, Demonstration, Tactile cues, and Verbal cues Education comprehension: verbalized understanding, returned demonstration, verbal cues required, tactile cues required, and needs further education   HOME EXERCISE PROGRAM:  Access Code: [email or web address] URL: [email or web address] Date: 05/04/2022 Prepared by: Sande Brothers  Exercises - Sidelying Thoracic Rotation with Open Book  - 1 x daily - 3 sets -  10 reps - Supine Lower Trunk Rotation  - 1 x daily - 3 sets - 10 reps - Quadruped Full Range Thoracic Rotation with Reach  - 1 x daily - 3 sets - 10 reps    PT Short Term Goals -       PT SHORT TERM GOAL #1   Title Pt will be independent with HEP in order to improve strength and balance in order to decrease fall risk and improve function at home and work.    Baseline 07/21/2021- Paitent presents with no formal HEP in place. 08/21/2021- Patient reports compliance with HEP and states no questions as of today- able to perform standin using his rollator for support.    Time 6    Period Weeks    Status Achieved    Target Date 09/01/21              PT Long Term Goals -      PT LONG TERM GOAL #1   Title Pt will improve FOTO to target score of 53  to display perceived improvements in ability to complete ADL's.    Baseline 07/21/2021= 45; 1/5=51. 10/12/2021- Just reassessed for progress note so did not reassess again- will continue with goal into new certification 9/41: 74% 01/07/2022= 59%   Time 12    Period Weeks    Status Achieved    Target Date 03/30/22      PT  LONG TERM GOAL #2   Title Pt will improve BERG by at least 5 points in order to demonstrate clinically significant improvement in balance.    Baseline 07/21/2021= 39/56; 08/21/2021=46/56- Will keep goal active to ensure patient able to test consistently; 10/05/2021= 52/56    Time 12    Period Weeks    Status Achieved    Target Date 10/13/21      PT LONG TERM GOAL #3   Title Pt will decrease 5TSTS by at least 5 seconds in order to demonstrate clinically significant improvement in LE strength.    Baseline 07/21/2021= 21 sec without UE Support; 11/21= 14.0 sec without UE support-.Will Keep goal active to ensure consistency.  10/05/2021=14.75 sec without UE support  (improved from 21 sec and unchanged from last assessment of 14 sec)    Time 12    Period Weeks    Status Achieved    Target Date 10/13/21      PT LONG TERM GOAL #4   Title Pt will decrease TUG to below  18 seconds/decrease in order to demonstrate decreased fall risk.    Baseline 07/21/2021= 23 sec with 4WW; 08/21/2021= 22.13 sec with 4WW; 10/05/2021= 19.51 sec avg with 4WW 2/10: 15.4 seconds with cane  12/14/2021= 18.0 sec; 06/22/2022= 16.95 sec with SPC   Time 12    Period Weeks    Status Achieved    Target Date 01/05/22      PT LONG TERM GOAL #5   Title Pt will increase 10MWT by at least 0.23 m/s in order to demonstrate clinically significant improvement in community ambulation.    Baseline 07/21/2021= 0.75ms; 08/21/2021= 0.57 m/s using 4WW; 10/05/2021= 0.6 m/s avg using 4WW 2/10: 0.51 m/s with cane. 12/14/2021= 0.65 m/s using SPC ; 04/20/22: .599 m/s SPC; 06/22/2022= 0.57 m/s SPC. 07/06/2022= 0.72 m/s (will keep goal active to ensure consistency)    Time 12    Period Weeks    Status ONGOING   Target Date 09/14/2022     PT LONG TERM GOAL #6   Title  Patient will increase six minute walk test distance to > 100 for progression to community ambulator and improve gait ability    Baseline 01/05/2022= 495 feet using SPC; 01/29/2022= 470 feet in 4  min 30 sec using SPC ; 04/20/2022: 478'; 570' in 6 min with SPC (several stops due to patient leaning too far forward and out of control) 07/06/2022= 450 feet in 4 min 10 sec yet several stops secondary to walker too far in front and dragging left - Patient became unsafe- leaning too far anteriorly and dragging left LE- unable to self correct or with verbal cues today.    Time 12    Period Weeks    Status On-going    Target Date 09/14/2022   PT LONG TERM GOAL #7  Title Pt will improve DGI by at least 3 points in order to demonstrate clinically significant improvement in balance and decreased risk for falls.  Baseline 06/22/2022-12/24; 07/06/2022= 15/24  Time 12   Period Weeks   Status Progressing.   Target Date 09/14/2022            Plan - 03/23/22 0855     Clinical Impression Statement Treatment continues to focus on paitent left side inattention and weakness. Overall he performed well today- able to workout with 5lb AW well today and did demonstrate some dragging with mobility activities in // bars but able to correct 90% of time. He continues to practice the 4s's strategy- stop/sigh/shift/step with walking and presents with improved safety using this technique but continues to require VC to perform this strategy.  Pt will continue to benefit from skilled PT services to improve risk for falls via postural and gait stability and strengthening.     Personal Factors and Comorbidities Comorbidity 3+    Comorbidities A- Fib, Renal cancer, Addison's disease, gout, arthritis, DM    Examination-Activity Limitations Caring for Others;Carry;Dressing;Lift;Reach Overhead;Squat;Stairs;Transfers;Stand    Examination-Participation Restrictions Investment banker, operational;Yard Work;Shop    Stability/Clinical Decision Making Evolving/Moderate complexity    Rehab Potential Good    PT Frequency 1x / week    PT Duration 12 weeks    PT Treatment/Interventions ADLs/Self Care Home Management;Canalith  Repostioning;Cryotherapy;Electrical Stimulation;Moist Heat;Traction;Ultrasound;DME Instruction;Gait training;Stair training;Functional mobility training;Therapeutic activities;Therapeutic exercise;Balance training;Neuromuscular re-education;Patient/family education;Orthotic Fit/Training;Wheelchair mobility training;Manual techniques;Passive range of motion;Dry needling;Energy conservation;Taping;Vestibular    PT Next Visit Plan Continue with balance training and LE strengthening for safety with mobility. Please incorporate postural strengthening due to anterior trunk lean with gait.    PT Home Exercise Plan 08/17/2021-Access Code: [email or web address]; no updates    Consulted and Agree with Plan of Care Patient;Family member/caregiver    Family Member Consulted WIfe- [name]             Ollen Bowl, PT Physical Therapist- [facility]  07/27/2022, 11:40 AM

## 2022-07-29 ENCOUNTER — Emergency Department: Payer: Medicare Other

## 2022-07-29 ENCOUNTER — Other Ambulatory Visit: Payer: Self-pay

## 2022-07-29 ENCOUNTER — Encounter: Payer: Self-pay | Admitting: Intensive Care

## 2022-07-29 ENCOUNTER — Inpatient Hospital Stay: Payer: Medicare Other

## 2022-07-29 ENCOUNTER — Inpatient Hospital Stay
Admission: EM | Admit: 2022-07-29 | Discharge: 2022-08-02 | DRG: 054 | Disposition: A | Discharge status: Skilled Nursing Facility | Payer: Medicare Other | Attending: Internal Medicine | Admitting: Internal Medicine

## 2022-07-29 DIAGNOSIS — I480 Paroxysmal atrial fibrillation: Secondary | ICD-10-CM | POA: Diagnosis present

## 2022-07-29 DIAGNOSIS — R4182 Altered mental status, unspecified: Secondary | ICD-10-CM | POA: Diagnosis present

## 2022-07-29 DIAGNOSIS — G936 Cerebral edema: Secondary | ICD-10-CM | POA: Diagnosis present

## 2022-07-29 DIAGNOSIS — E271 Primary adrenocortical insufficiency: Secondary | ICD-10-CM | POA: Diagnosis present

## 2022-07-29 DIAGNOSIS — E039 Hypothyroidism, unspecified: Secondary | ICD-10-CM

## 2022-07-29 DIAGNOSIS — C7889 Secondary malignant neoplasm of other digestive organs: Secondary | ICD-10-CM | POA: Diagnosis present

## 2022-07-29 DIAGNOSIS — C642 Malignant neoplasm of left kidney, except renal pelvis: Secondary | ICD-10-CM | POA: Diagnosis present

## 2022-07-29 DIAGNOSIS — Z87891 Personal history of nicotine dependence: Secondary | ICD-10-CM | POA: Diagnosis not present

## 2022-07-29 DIAGNOSIS — I619 Nontraumatic intracerebral hemorrhage, unspecified: Secondary | ICD-10-CM

## 2022-07-29 DIAGNOSIS — E1165 Type 2 diabetes mellitus with hyperglycemia: Secondary | ICD-10-CM | POA: Diagnosis present

## 2022-07-29 DIAGNOSIS — Z8041 Family history of malignant neoplasm of ovary: Secondary | ICD-10-CM

## 2022-07-29 DIAGNOSIS — G2581 Restless legs syndrome: Secondary | ICD-10-CM | POA: Diagnosis present

## 2022-07-29 DIAGNOSIS — I4892 Unspecified atrial flutter: Secondary | ICD-10-CM | POA: Diagnosis present

## 2022-07-29 DIAGNOSIS — Z515 Encounter for palliative care: Secondary | ICD-10-CM | POA: Diagnosis not present

## 2022-07-29 DIAGNOSIS — E559 Vitamin D deficiency, unspecified: Secondary | ICD-10-CM | POA: Diagnosis present

## 2022-07-29 DIAGNOSIS — M6281 Muscle weakness (generalized): Secondary | ICD-10-CM | POA: Diagnosis not present

## 2022-07-29 DIAGNOSIS — Z79899 Other long term (current) drug therapy: Secondary | ICD-10-CM

## 2022-07-29 DIAGNOSIS — Z8249 Family history of ischemic heart disease and other diseases of the circulatory system: Secondary | ICD-10-CM | POA: Diagnosis not present

## 2022-07-29 DIAGNOSIS — F419 Anxiety disorder, unspecified: Secondary | ICD-10-CM | POA: Diagnosis present

## 2022-07-29 DIAGNOSIS — Z79890 Hormone replacement therapy (postmenopausal): Secondary | ICD-10-CM

## 2022-07-29 DIAGNOSIS — Z7984 Long term (current) use of oral hypoglycemic drugs: Secondary | ICD-10-CM

## 2022-07-29 DIAGNOSIS — K219 Gastro-esophageal reflux disease without esophagitis: Secondary | ICD-10-CM | POA: Diagnosis present

## 2022-07-29 DIAGNOSIS — Z7952 Long term (current) use of systemic steroids: Secondary | ICD-10-CM

## 2022-07-29 DIAGNOSIS — C7971 Secondary malignant neoplasm of right adrenal gland: Secondary | ICD-10-CM | POA: Diagnosis present

## 2022-07-29 DIAGNOSIS — C7931 Secondary malignant neoplasm of brain: Principal | ICD-10-CM | POA: Diagnosis present

## 2022-07-29 DIAGNOSIS — Z7189 Other specified counseling: Secondary | ICD-10-CM | POA: Diagnosis not present

## 2022-07-29 DIAGNOSIS — Z905 Acquired absence of kidney: Secondary | ICD-10-CM

## 2022-07-29 DIAGNOSIS — C7972 Secondary malignant neoplasm of left adrenal gland: Secondary | ICD-10-CM | POA: Diagnosis present

## 2022-07-29 DIAGNOSIS — Z66 Do not resuscitate: Secondary | ICD-10-CM | POA: Diagnosis present

## 2022-07-29 DIAGNOSIS — E785 Hyperlipidemia, unspecified: Secondary | ICD-10-CM | POA: Diagnosis present

## 2022-07-29 DIAGNOSIS — C7802 Secondary malignant neoplasm of left lung: Secondary | ICD-10-CM | POA: Diagnosis present

## 2022-07-29 DIAGNOSIS — Z9221 Personal history of antineoplastic chemotherapy: Secondary | ICD-10-CM

## 2022-07-29 DIAGNOSIS — E890 Postprocedural hypothyroidism: Secondary | ICD-10-CM | POA: Diagnosis present

## 2022-07-29 DIAGNOSIS — I4891 Unspecified atrial fibrillation: Secondary | ICD-10-CM | POA: Diagnosis not present

## 2022-07-29 DIAGNOSIS — E119 Type 2 diabetes mellitus without complications: Secondary | ICD-10-CM | POA: Diagnosis not present

## 2022-07-29 DIAGNOSIS — Z85528 Personal history of other malignant neoplasm of kidney: Secondary | ICD-10-CM

## 2022-07-29 DIAGNOSIS — Z833 Family history of diabetes mellitus: Secondary | ICD-10-CM

## 2022-07-29 DIAGNOSIS — M109 Gout, unspecified: Secondary | ICD-10-CM

## 2022-07-29 DIAGNOSIS — Z923 Personal history of irradiation: Secondary | ICD-10-CM | POA: Diagnosis not present

## 2022-07-29 DIAGNOSIS — C7801 Secondary malignant neoplasm of right lung: Secondary | ICD-10-CM | POA: Diagnosis present

## 2022-07-29 DIAGNOSIS — I618 Other nontraumatic intracerebral hemorrhage: Secondary | ICD-10-CM | POA: Diagnosis not present

## 2022-07-29 DIAGNOSIS — I10 Essential (primary) hypertension: Secondary | ICD-10-CM | POA: Diagnosis present

## 2022-07-29 DIAGNOSIS — R29702 NIHSS score 2: Secondary | ICD-10-CM | POA: Diagnosis present

## 2022-07-29 DIAGNOSIS — Z823 Family history of stroke: Secondary | ICD-10-CM

## 2022-07-29 LAB — CBC
HCT: 43.1 % (ref 39.0–52.0)
Hemoglobin: 14.1 g/dL (ref 13.0–17.0)
MCH: 26.9 pg (ref 26.0–34.0)
MCHC: 32.7 g/dL (ref 30.0–36.0)
MCV: 82.1 fL (ref 80.0–100.0)
Platelets: 284 10*3/uL (ref 150–400)
RBC: 5.25 MIL/uL (ref 4.22–5.81)
RDW: 14.9 % (ref 11.5–15.5)
WBC: 6.4 10*3/uL (ref 4.0–10.5)
nRBC: 0 % (ref 0.0–0.2)

## 2022-07-29 LAB — DIFFERENTIAL
Abs Immature Granulocytes: 0.04 10*3/uL (ref 0.00–0.07)
Basophils Absolute: 0 10*3/uL (ref 0.0–0.1)
Basophils Relative: 1 %
Eosinophils Absolute: 0.4 10*3/uL (ref 0.0–0.5)
Eosinophils Relative: 6 %
Immature Granulocytes: 1 %
Lymphocytes Relative: 28 %
Lymphs Abs: 1.8 10*3/uL (ref 0.7–4.0)
Monocytes Absolute: 0.6 10*3/uL (ref 0.1–1.0)
Monocytes Relative: 9 %
Neutro Abs: 3.6 10*3/uL (ref 1.7–7.7)
Neutrophils Relative %: 55 %

## 2022-07-29 LAB — COMPREHENSIVE METABOLIC PANEL
ALT: 23 U/L (ref 0–44)
AST: 21 U/L (ref 15–41)
Albumin: 3.9 g/dL (ref 3.5–5.0)
Alkaline Phosphatase: 86 U/L (ref 38–126)
Anion gap: 9 (ref 5–15)
BUN: 24 mg/dL — ABNORMAL HIGH (ref 8–23)
CO2: 25 mmol/L (ref 22–32)
Calcium: 9.3 mg/dL (ref 8.9–10.3)
Chloride: 103 mmol/L (ref 98–111)
Creatinine, Ser: 1.02 mg/dL (ref 0.61–1.24)
GFR, Estimated: >60 mL/min (ref >60)
Glucose, Bld: 164 mg/dL — ABNORMAL HIGH (ref 70–99)
Potassium: 4.2 mmol/L (ref 3.5–5.1)
Sodium: 137 mmol/L (ref 135–145)
Total Bilirubin: 0.9 mg/dL (ref 0.3–1.2)
Total Protein: 7.2 g/dL (ref 6.5–8.1)

## 2022-07-29 LAB — APTT: aPTT: 29 seconds (ref 24–36)

## 2022-07-29 LAB — URINALYSIS, ROUTINE W REFLEX MICROSCOPIC
Bilirubin Urine: NEGATIVE
Glucose, UA: NEGATIVE mg/dL
Hgb urine dipstick: NEGATIVE
Ketones, ur: NEGATIVE mg/dL
Leukocytes,Ua: NEGATIVE
Nitrite: NEGATIVE
Protein, ur: NEGATIVE mg/dL
Specific Gravity, Urine: 1.013 (ref 1.005–1.030)
pH: 5 (ref 5.0–8.0)

## 2022-07-29 LAB — PROTIME-INR
INR: 1.1 (ref 0.8–1.2)
Prothrombin Time: 14.2 seconds (ref 11.4–15.2)

## 2022-07-29 LAB — CBG MONITORING, ED
Glucose-Capillary: 170 mg/dL — ABNORMAL HIGH (ref 70–99)
Glucose-Capillary: 208 mg/dL — ABNORMAL HIGH (ref 70–99)
Glucose-Capillary: 250 mg/dL — ABNORMAL HIGH (ref 70–99)
Glucose-Capillary: 299 mg/dL — ABNORMAL HIGH (ref 70–99)
Glucose-Capillary: 290 mg/dL — ABNORMAL HIGH (ref 70–99)

## 2022-07-29 LAB — HEMOGLOBIN A1C
Hgb A1c MFr Bld: 7.2 % — ABNORMAL HIGH (ref 4.8–5.6)
Mean Plasma Glucose: 159.94 mg/dL

## 2022-07-29 MED ORDER — VITAMIN D 25 MCG (1000 UNIT) PO TABS
5000.0000 [IU] | ORAL_TABLET | Freq: Every day | ORAL | Status: DC
  Administered 2022-07-29 – 2022-08-02 (×5): 5000 [IU] via ORAL
  Filled 2022-07-29 (×5): qty 5

## 2022-07-29 MED ORDER — SODIUM CHLORIDE 0.9% FLUSH
3.0000 mL | INTRAVENOUS | Status: DC | PRN
  Administered 2022-07-29: 3 mL via INTRAVENOUS

## 2022-07-29 MED ORDER — CYCLOBENZAPRINE HCL 10 MG PO TABS: 10.0000 mg | ORAL_TABLET | Freq: Three times a day (TID) | ORAL | Status: DC | PRN

## 2022-07-29 MED ORDER — ONDANSETRON HCL 4 MG PO TABS: 4.0000 mg | ORAL_TABLET | Freq: Four times a day (QID) | ORAL | Status: DC | PRN

## 2022-07-29 MED ORDER — SODIUM CHLORIDE 0.9 % IV SOLN: 250.0000 mL | INTRAVENOUS | Status: DC | PRN

## 2022-07-29 MED ORDER — FLUDROCORTISONE ACETATE 0.1 MG PO TABS
0.1000 mg | ORAL_TABLET | ORAL | Status: DC
  Administered 2022-07-30 – 2022-08-01 (×2): 0.1 mg via ORAL
  Filled 2022-07-29 (×2): qty 1

## 2022-07-29 MED ORDER — GABAPENTIN 300 MG PO CAPS
900.0000 mg | ORAL_CAPSULE | Freq: Every day | ORAL | Status: DC
  Administered 2022-07-29 – 2022-08-01 (×4): 900 mg via ORAL
  Filled 2022-07-29 (×4): qty 3

## 2022-07-29 MED ORDER — VITAMIN B-12 1000 MCG PO TABS
1000.0000 ug | ORAL_TABLET | ORAL | Status: DC
  Administered 2022-07-29 – 2022-08-02 (×3): 1000 ug via ORAL
  Filled 2022-07-29 (×2): qty 1
  Filled 2022-07-29: qty 2

## 2022-07-29 MED ORDER — LEVOTHYROXINE SODIUM 50 MCG PO TABS
75.0000 ug | ORAL_TABLET | Freq: Every day | ORAL | Status: DC
  Administered 2022-07-30 – 2022-08-02 (×4): 75 ug via ORAL
  Filled 2022-07-29 (×4): qty 1

## 2022-07-29 MED ORDER — SODIUM CHLORIDE 0.9 % IV SOLN: 60.0000 mg | Freq: Once | INTRAVENOUS | Status: DC

## 2022-07-29 MED ORDER — ACETAMINOPHEN 325 MG PO TABS: 650.0000 mg | ORAL_TABLET | Freq: Four times a day (QID) | ORAL | Status: DC | PRN

## 2022-07-29 MED ORDER — ACETAMINOPHEN 650 MG RE SUPP: 650.0000 mg | Freq: Four times a day (QID) | RECTAL | Status: DC | PRN

## 2022-07-29 MED ORDER — MIRABEGRON ER 50 MG PO TB24
50.0000 mg | ORAL_TABLET | Freq: Every day | ORAL | Status: DC
  Administered 2022-07-30 – 2022-08-02 (×4): 50 mg via ORAL
  Filled 2022-07-29 (×4): qty 1

## 2022-07-29 MED ORDER — ONDANSETRON HCL 4 MG/2ML IJ SOLN: 4.0000 mg | Freq: Four times a day (QID) | INTRAMUSCULAR | Status: DC | PRN

## 2022-07-29 MED ORDER — INSULIN ASPART 100 UNIT/ML IJ SOLN
0.0000 [IU] | Freq: Three times a day (TID) | INTRAMUSCULAR | Status: DC
  Administered 2022-07-29 – 2022-07-30 (×2): 5 [IU] via SUBCUTANEOUS
  Administered 2022-07-30 (×2): 8 [IU] via SUBCUTANEOUS
  Administered 2022-07-31: 3 [IU] via SUBCUTANEOUS
  Administered 2022-07-31: 5 [IU] via SUBCUTANEOUS
  Administered 2022-07-31 – 2022-08-01 (×2): 2 [IU] via SUBCUTANEOUS
  Administered 2022-08-01: 3 [IU] via SUBCUTANEOUS
  Administered 2022-08-01: 11 [IU] via SUBCUTANEOUS
  Administered 2022-08-02: 5 [IU] via SUBCUTANEOUS
  Administered 2022-08-02: 3 [IU] via SUBCUTANEOUS
  Filled 2022-07-29 (×11): qty 1

## 2022-07-29 MED ORDER — HYDROCORTISONE 5 MG PO TABS
5.0000 mg | ORAL_TABLET | Freq: Two times a day (BID) | ORAL | Status: DC
  Administered 2022-07-29 – 2022-08-02 (×8): 5 mg via ORAL
  Filled 2022-07-29 (×9): qty 1

## 2022-07-29 MED ORDER — HYDROCORTISONE 5 MG PO TABS
5.0000 mg | ORAL_TABLET | Freq: Two times a day (BID) | ORAL | Status: DC
  Filled 2022-07-29: qty 1

## 2022-07-29 MED ORDER — PSYLLIUM 95 % PO PACK: 1.0000 | PACK | Freq: Every day | ORAL | Status: DC | PRN

## 2022-07-29 MED ORDER — HYDROCORTISONE 5 MG PO TABS
5.0000 mg | ORAL_TABLET | Freq: Once | ORAL | Status: AC
  Administered 2022-07-29: 5 mg via ORAL
  Filled 2022-07-29: qty 1

## 2022-07-29 MED ORDER — INSULIN ASPART 100 UNIT/ML IJ SOLN: 0.0000 [IU] | INTRAMUSCULAR | Status: DC

## 2022-07-29 MED ORDER — DEXAMETHASONE SODIUM PHOSPHATE 10 MG/ML IJ SOLN
10.0000 mg | Freq: Once | INTRAMUSCULAR | Status: AC
  Administered 2022-07-29: 10 mg via INTRAVENOUS
  Filled 2022-07-29: qty 1

## 2022-07-29 MED ORDER — SODIUM CHLORIDE 0.9% FLUSH
3.0000 mL | Freq: Two times a day (BID) | INTRAVENOUS | Status: DC
  Administered 2022-07-29 – 2022-08-02 (×9): 3 mL via INTRAVENOUS

## 2022-07-29 MED ORDER — COLCHICINE 0.6 MG PO TABS: 0.6000 mg | ORAL_TABLET | Freq: Every day | ORAL | Status: DC | PRN

## 2022-07-29 MED ORDER — GADOBUTROL 1 MMOL/ML IV SOLN
9.0000 mL | Freq: Once | INTRAVENOUS | Status: AC | PRN
  Administered 2022-07-29: 10 mL via INTRAVENOUS

## 2022-07-29 MED ORDER — STROKE: EARLY STAGES OF RECOVERY BOOK: Freq: Once | Status: AC

## 2022-07-29 MED ORDER — FLUDROCORTISONE ACETATE 0.1 MG PO TABS
0.0500 mg | ORAL_TABLET | ORAL | Status: DC
  Administered 2022-07-29 – 2022-08-02 (×3): 0.05 mg via ORAL
  Filled 2022-07-29 (×3): qty 0.5

## 2022-07-29 MED ORDER — OCUVITE-LUTEIN PO CAPS
1.0000 | ORAL_CAPSULE | Freq: Every day | ORAL | Status: DC
  Administered 2022-07-30 – 2022-08-02 (×4): 1 via ORAL
  Filled 2022-07-29 (×4): qty 1

## 2022-07-29 MED ORDER — HYDROCORTISONE 5 MG PO TABS
15.0000 mg | ORAL_TABLET | Freq: Every day | ORAL | Status: DC
  Administered 2022-07-30 – 2022-08-02 (×4): 15 mg via ORAL
  Filled 2022-07-29 (×4): qty 1

## 2022-07-29 NOTE — Assessment & Plan Note (Signed)
Continue Florinef and hydrocortisone

## 2022-07-29 NOTE — Assessment & Plan Note (Signed)
Patient has a known history of metastatic left renal cell carcinoma and is status post left nephrectomy and bilateral adrenalectomy with new metastatic disease to the pancreas, lungs and nephrectomy bed. Oncology consult

## 2022-07-29 NOTE — Progress Notes (Signed)
Full note to follow:   History of RCC s/p nephrectomy with new pancreatic lesion and new onset of weakness.   Concern for hemorraghic metastases on CT.   Plan for MRI W/ and W/0 contrast to evaluate for a lesion, otherwise will consider transfer for hemorrhagic stroke.

## 2022-07-29 NOTE — Evaluation (Signed)
Speech Language Pathology Evaluation Patient Details Name: Tony Huffman MRN: [id number] DOB: 1946/01/13 Today's Date: 07/29/2022 Time: 1600-1620 SLP Time Calculation (min) (ACUTE ONLY): 20 min  Problem List:  Patient Active Problem List   Diagnosis Date Noted   Cerebral parenchymal hemorrhage ([place]) 07/29/2022   Left-sided muscle weakness    Renal cell carcinoma, left ([place]) 06/19/2022   Other abnormal tumor markers 06/19/2022   Goals of care, counseling/discussion 06/19/2022   Advance care planning 05/27/2022   Gait abnormality 05/27/2022   Diabetes mellitus without complication ([place]) 48/54/6270   Addisons disease ([place]) 03/16/2021   Low HDL (under 40) 03/16/2021   Numbness and tingling 11/21/2020   Pancreatic mass 08/01/2018   H/O sebaceous cyst 01/06/2018   Edema leg 03/07/2017   OSA (obstructive sleep apnea) 07/24/2016   RLS (restless legs syndrome) 07/24/2016   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   Atrial fibrillation and flutter ([place]) 10/20/2014   Hyperlipidemia 06/16/2014   Cough syncope 10/15/2013   Dyslipidemia 09/04/2013   Gout 09/04/2013   Chronic kidney disease, stage III (moderate) ([place]) 11/13/2011   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   Hypothyroidism (acquired) 11/30/2010   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[place]) 11/10/2010   Past Medical History:  Past Medical History:  Diagnosis Date   Adrenal insufficiency ([place])    Anxiety    Chronic kidney disease, stage 3 ([place]) 11/13/2011   Erectile dysfunction    GERD (gastroesophageal reflux disease)    Gout 09/04/2013   H/O atrial flutter 03/07/2017   Hyperlipidemia 06/16/2014   Hypertension    Hypothyroidism (acquired) 04/14/2014   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    COLONOSCOPY  05/06/2017   with removal lesions by snare.   CYSTECTOMY     Upper Neck/Chest Area, [facility]   NEPHRECTOMY     POPLITEAL SYNOVIAL CYST EXCISION     SPINE SURGERY  1989   THYROIDECTOMY     patient states they only removed half   TONSILLECTOMY     HPI:  Tony Huffman is a 76 y.o. 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  Patient now has recurrence of his disease with mets to the lungs, left nephrectomy site and pancreas.  Patient declined repeat biopsy due to previous unsuccessful biopsies at outside facilities.  He has been on hospice but is currently on palliative care. He also has past medical history significant for hypothyroidism, hypertension, obstructive sleep apnea, adrenal insufficiency and diabetes mellitus. He presented to the ER by EMS for evaluation of left upper extremity weakness which he noted when he woke up at about 7:45 AM.  Last known well was about 10 PM last night and patient woke up with left upper extremity weakness.  He fell on the morning of his admission while attempting to ambulate because he was unable to hold onto his rolling walker.  At baseline he uses an assist device and has some left leg weakness. MRI 07/29/22: 2.6 cm enhancing mass lesion in the medial left parietal lobe associated with the acute hemorrhage is compatible with a renal cell carcinoma metastasis. No additional metastases present. Stable moderate generalized atrophy.   Assessment / Plan / Recommendation Clinical Impression  Pt seen for  initiation of cognitive communication assessment in the setting of cerebral parenchymal hemorrhage . Assessment consisted of informal pt interview and portions of the Mini Mental State Examination. Pt with noted impairment with orientation, recall, and thought organization. Pt's wife endorses "a few episodes" of confusion/forgetfulness since admission. Basic verbal expression, receptive language, safety reasoning, and attention appear intact. Visual processing intact for reading clock, though pt endorses use of glasses at baseline. Education shared regarding monitoring/noting episodes of confusion and use of orientation aids. Pt/spouse reported understanding. Recommend use of orientation aids, repetition, and extended time for processing. Plan to follow up for further assessment during inpatient stay, with discharge recommendations pending decision regarding [facility] and further assessment.    SLP Assessment  SLP Recommendation/Assessment:  (TBD) SLP Visit Diagnosis: Cognitive communication deficit (R41.841)    Recommendations for follow up therapy are one component of a multi-disciplinary discharge planning process, led by the attending physician.  Recommendations may be updated based on patient status, additional functional criteria and insurance authorization.    Follow Up Recommendations   (TBD)    Assistance Recommended at Discharge  Intermittent Supervision/Assistance  Functional Status Assessment Patient has had a recent decline in their functional status and demonstrates the ability to make significant improvements in function in a reasonable and predictable amount of time.  Frequency and Duration min 1 x/week  1 week      SLP Evaluation Cognition  Overall Cognitive Status: Impaired/Different from baseline Arousal/Alertness: Awake/alert Orientation Level: Oriented to person;Oriented to place (required verbal cues for date and use of clock for time) Attention: Focused Focused Attention:  Appears intact Memory: Impaired Memory Impairment: Retrieval deficit (impaired for functional recall of friend's name) Awareness: Impaired Awareness Impairment: Emergent impairment Problem Solving:  (needs further assessment) Executive Function:  (needs further assessment) Safety/Judgment: Appears intact       Comprehension  Auditory Comprehension Overall Auditory Comprehension: Appears within functional limits for tasks assessed Visual Recognition/Discrimination Discrimination: Within Function Limits Reading Comprehension Reading Status:  (nedds further assessment when glasses present)    Expression Expression Primary Mode of Expression: Verbal Verbal Expression Overall Verbal Expression: Appears within functional limits for tasks assessed Written Expression Dominant Hand:  (needs further assessment)   Oral / Motor  Oral Motor/Sensory Function Overall Oral Motor/Sensory Function: Within functional limits Motor Speech Overall Motor Speech: Appears within functional limits for tasks assessed           [place] Malikah Principato Clapp  MS [facility] SLP   [place] J Clapp 07/29/2022, 6:23 PM

## 2022-07-29 NOTE — Assessment & Plan Note (Signed)
Continue Synthroid °

## 2022-07-29 NOTE — Consult Note (Addendum)
Hematology/Oncology Consult note [phone] [phone]      Patient Care Team: Tonia Ghent, MD as PCP - General (Family Medicine) Gabriel Carina, Betsey Holiday, MD as Physician Assistant (Endocrinology) [name], MD as Referring Physician (Neurology)   Name of the patient: Tony Huffman  [id number]  [birth year]   REASON FOR COSULTATION:   History of presenting illness-  76 y.o. male with PMH listed at below who presents to ER due to acute left upper extremity weakness which he noticed when he woke up this morning around 7:45 AM.  He was known well last night prior to going to sleep. He noticed the weakness when he tried to hold onto rolling walker. CT head showed 2.7 x 1.4 x 2.1 cm acute parenchymal hemorrhage in the superior medial left parietal lobe.  Suspect secondary to hemorrhagic metastasis lesion. Neurosurgery was consulted and patient was recommended to have an MRI with and without contrast for further evaluation.  Oncology was consulted due to patient's known history of renal cell carcinoma dated back to 2011.  Patient has had history of radical nephrectomy, adrenalectomy bilaterally, chronic bilateral pulmonary nodules felt to be secondary to metastatic lesions, he has also has a history of pancreatic lesion, noticed on CT scans since 2019. Currently most recent CT abdomen showed pancreatic lesion 4.7 by 3.3 cm enhancing mass involving descending duodenum and region of ampulla, the size is enlarging.  There is also left adrenal gland mass 2.8 x 2.7 cm, likely metastatic RCC progression.  Establish care with me in September 2023 and patient was recommended to have a PET scan done 07/05/2022 PET scan showed hypermetabolic soft tissue lesion at the wall of descending duodenum and the pancreatic head, differentials include metastatic RCC versus primary duodenal/ampullary/pancreatic malignancies.  Hypermetabolic soft tissue nodule at the left necrotic sites. Numerous  bilateral hypermetabolic pulmonary nodules.  Enlarged hypermetabolic left hilar lymph node.  I discussed about biopsy options which patient is not interested.  He was also not interested in proceeding with any aggressive treatments or work-up.  Patient declined palliative care evaluation as well.  Oncology was consulted for further evaluation management.  I saw patient in ER.  Wife is at bedside.  Patient subjectively feels left upper extremity weakness has improved since admission.  No nausea vomiting.  He is going to have MRI brain done.  Allergies  Allergen Reactions   Other Rash    Blisters with bandaids Blisters with bandaids    Tegaderm Ag Mesh [Silver]    Tape Rash    Blisters, rash. Paper tape OK. Blisters, rash. Paper tape OK.     Patient Active Problem List   Diagnosis Date Noted   Renal cell carcinoma, left ([place]) 06/19/2022    Priority: High   Pancreatic mass 08/01/2018    Priority: Medium    Goals of care, counseling/discussion 06/19/2022    Priority: Low   Cerebral parenchymal hemorrhage ([place]) 07/29/2022   Other abnormal tumor markers 06/19/2022   Advance care planning 05/27/2022   Gait abnormality 05/27/2022   Diabetes mellitus without complication ([place]) 89/21/1942   Addisons disease ([place]) 03/16/2021   Low HDL (under 40) 03/16/2021   Numbness and tingling 11/21/2020   H/O sebaceous cyst 01/06/2018   Edema leg 03/07/2017   OSA (obstructive sleep apnea) 07/24/2016   RLS (restless legs syndrome) 07/24/2016   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   Atrial fibrillation and flutter ([place]) 10/20/2014   Hyperlipidemia 06/16/2014   Cough syncope 10/15/2013  Dyslipidemia 09/04/2013   Gout 09/04/2013   Chronic kidney disease, stage III (moderate) ([place]) 11/13/2011   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   Hypothyroidism (acquired) 11/30/2010   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     Past Medical History:  Diagnosis Date    Adrenal insufficiency ([place])    Anxiety    Chronic kidney disease, stage 3 ([place]) 11/13/2011   Erectile dysfunction    GERD (gastroesophageal reflux disease)    Gout 09/04/2013   H/O atrial flutter 03/07/2017   Hyperlipidemia 06/16/2014   Hypertension    Hypothyroidism (acquired) 04/14/2014   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    COLONOSCOPY  05/06/2017   with removal lesions by snare.   CYSTECTOMY     Upper Neck/Chest Area, [facility]   NEPHRECTOMY     POPLITEAL SYNOVIAL CYST EXCISION     SPINE SURGERY  1989   THYROIDECTOMY     patient states they only removed half   TONSILLECTOMY      Social History   Socioeconomic History   Marital status: Married    Spouse name: Not on file   Number of children: Not on file   Years of education: Not on file   Highest education level: Not on file  Occupational History   Not on file  Tobacco Use   Smoking status: Former    Packs/day: 1.00    Years: 10.00    Total pack years: 10.00    Types: Cigarettes    Quit date: 73    Years since quitting: 45.8    Passive exposure: Past   Smokeless tobacco: Never  Vaping Use   Vaping Use: Never used  Substance and Sexual Activity   Alcohol use: Not Currently   Drug use: Never   Sexual activity: Not Currently  Other Topics Concern   Not on file  Social History Narrative   Married 2002.   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   Lives at [facility]   Social Determinants of  Health   Financial Resource Strain: Not on file  Food Insecurity: Not on file  Transportation Needs: Not on file  Physical Activity: Not on file  Stress: Not on file  Social Connections: Not on file  Intimate Partner Violence: Not on file     Family History  Problem Relation Age of Onset   Ovarian cancer Mother    Cancer Mother    Cancer Father    Heart disease Father    Coronary artery disease Father    Hypertension Father    Macular degeneration Father    Heart disease Brother    Diabetes Brother    Coronary artery disease Brother    Diabetes type II Brother    Coronary artery disease Paternal Grandfather      Current Facility-Administered Medications:    [START ON 07/30/2022]  stroke: early stages of recovery book, , Does not apply, Once, Agbata, Tochukwu, MD   0.9 %  sodium chloride infusion, 250 mL, Intravenous, PRN, Agbata, Tochukwu, MD   acetaminophen (TYLENOL) tablet 650 mg, 650 mg, Oral, Q6H PRN **OR** acetaminophen (TYLENOL) suppository 650 mg, 650 mg, Rectal, Q6H PRN, Agbata, Tochukwu, MD   cholecalciferol (VITAMIN D3) 25 MCG (1000 UNIT) tablet 5,000 Units, 5,000 Units, Oral, Daily, Agbata, Tochukwu, MD   colchicine tablet 0.6 mg, 0.6 mg, Oral, Daily PRN, Agbata, Tochukwu, MD   cyanocobalamin (VITAMIN B12) tablet 1,000 mcg, 1,000 mcg, Oral, QODAY, Agbata, Tochukwu, MD   cyclobenzaprine (FLEXERIL) tablet 10 mg, 10 mg, Oral, TID PRN, Agbata, Tochukwu, MD   fludrocortisone (FLORINEF) tablet 0.05 mg, 0.05 mg, Oral, QODAY, Agbata, Tochukwu, MD   [START ON 07/30/2022] fludrocortisone (FLORINEF) tablet 0.1 mg, 0.1 mg, Oral, QODAY, Agbata, Tochukwu, MD   gabapentin (NEURONTIN) capsule 900 mg, 900 mg, Oral, QHS, Agbata, Tochukwu, MD   [START ON 07/30/2022] hydrocortisone (CORTEF) tablet 15 mg, 15 mg, Oral, QAC breakfast, Agbata, Tochukwu, MD   hydrocortisone (CORTEF) tablet 5 mg, 5 mg, Oral, BID AC, Agbata, Tochukwu, MD   insulin aspart (novoLOG) injection 0-15 Units, 0-15  Units, Subcutaneous, Q4H, Agbata, Tochukwu, MD   [START ON 07/30/2022] levothyroxine (SYNTHROID) tablet 75 mcg, 75 mcg, Oral, Q0600, Agbata, Tochukwu, MD   [START ON 07/30/2022] mirabegron ER (MYRBETRIQ) tablet 50 mg, 50 mg, Oral, Daily, Agbata, Tochukwu, MD   [START ON 07/30/2022] multivitamin-lutein (OCUVITE-LUTEIN) capsule 1 capsule, 1 capsule, Oral, Daily, Agbata, Tochukwu, MD   ondansetron (ZOFRAN) tablet 4 mg, 4 mg, Oral, Q6H PRN **OR** ondansetron (ZOFRAN) injection 4 mg, 4 mg, Intravenous, Q6H PRN, Agbata, Tochukwu, MD   psyllium (HYDROCIL/METAMUCIL) 1 packet, 1 packet, Oral, Daily PRN, Agbata, Tochukwu, MD   sodium chloride flush (NS) 0.9 % injection 3 mL, 3 mL, Intravenous, Q12H, Agbata, Tochukwu, MD   sodium chloride flush (NS) 0.9 % injection 3 mL, 3 mL, Intravenous, PRN, Agbata, Tochukwu, MD  Current Outpatient Medications:    Accu-Chek Softclix Lancets lancets, Use to check blood sugar once daily. Dx: E11.49 (Patient not taking: Reported on 06/19/2022), Disp: 100 each, Rfl: 3   blood glucose meter kit and supplies, 1 each by Other route as directed. Dispense based on patient and insurance preference. Use up to four times daily as directed. (FOR ICD-10 E10.9, E11.9). (Patient not taking: Reported on 06/19/2022), Disp: , Rfl:    Cholecalciferol 125 MCG (5000 UT) capsule, Take 5,000 Units by mouth daily., Disp: , Rfl:    colchicine 0.6 MG tablet, Take 1 tablet (0.6 mg total) by mouth as needed. First sign of Gout Flare. Take 2 tablets ( 1.74m) by mouth at first sign of gout flare followed by 1 tablet ( 0.63m after 1 hour. ( max 1.15m57mithin 1 hours), Disp: 180 tablet, Rfl: 1   cyanocobalamin 1000 MCG tablet, Take 1,000 mcg by mouth every other day., Disp: , Rfl:    cyclobenzaprine (FLEXERIL) 10 MG tablet, Take 1 tablet (10 mg total) by mouth 3 (three) times daily as needed for muscle spasms., Disp: 30 tablet, Rfl: 0   Fludrocortisone Acetate (FLORINEF PO), Take 0.1 mg by mouth every other  day. On alternate days take 1/2 tablet., Disp: , Rfl:    furosemide (LASIX) 20 MG tablet, Take 20 mg by mouth as needed. For Edema., Disp: , Rfl:    gabapentin (NEURONTIN) 600 MG tablet, Take 900 mg by mouth daily. Takes 1 and 1/2 tablet by mouth nightly., Disp: , Rfl:    glucose blood (ACCU-CHEK AVIVA PLUS) test strip, Use to test blood sugar once  daily. Dx: E11.49 (Patient not taking: Reported on 06/19/2022), Disp: 100 each, Rfl: 3   hydrocortisone (CORTEF) 10 MG tablet, Take by mouth daily. Takes 1 and 1/2 tablet every morning, 1/2 tablet every afternoon, and 1/2 tablet every evening. Take double dose as directed for stress, may take up to 85 tablets monthly., Disp: , Rfl:    levothyroxine (SYNTHROID) 75 MCG tablet, Take 75 mcg by mouth daily., Disp: , Rfl:    metFORMIN (GLUMETZA) 500 MG (MOD) 24 hr tablet, Take 500 mg by mouth daily., Disp: , Rfl:    mirabegron ER (MYRBETRIQ) 50 MG TB24 tablet, Take 1 tablet (50 mg total) by mouth daily., Disp: 90 tablet, Rfl: 3   Multiple Vitamins-Minerals (PRESERVISION AREDS 2 PO), Take 1 capsule by mouth in the morning and at bedtime., Disp: , Rfl:    PSYLLIUM HUSK PO, Take 3.4 g by mouth every evening., Disp: , Rfl:   Review of Systems - Oncology  PHYSICAL EXAM Vitals:   07/29/22 1000 07/29/22 1030 07/29/22 1100 07/29/22 1112  BP: 126/74 128/77 125/74 125/74  Pulse: 75 75 76 76  Resp:    18  Temp:    98.3 F (36.8 C)  TempSrc:    Oral  SpO2: 100% 100% 98% 98%  Weight:      Height:       Physical Exam    LABORATORY STUDIES    Latest Ref Rng & Units 07/29/2022    9:05 AM 06/19/2022   10:45 AM 05/07/2022   12:00 AM  CBC  WBC 4.0 - 10.5 K/uL 6.4  8.0  7.4      Hemoglobin 13.0 - 17.0 g/dL 14.1  13.7  14.2      Hematocrit 39.0 - 52.0 % 43.1  41.4  43      Platelets 150 - 400 K/uL 284  264  292         This result is from an external source.      Latest Ref Rng & Units 07/29/2022    9:05 AM 06/19/2022   10:45 AM 06/11/2022    2:04 PM  CMP   Glucose 70 - 99 mg/dL 164  181    BUN 8 - 23 mg/dL 24  24    Creatinine 0.61 - 1.24 mg/dL 1.02  0.94  1.10   Sodium 135 - 145 mmol/L 137  134    Potassium 3.5 - 5.1 mmol/L 4.2  4.9    Chloride 98 - 111 mmol/L 103  103    CO2 22 - 32 mmol/L 25  25    Calcium 8.9 - 10.3 mg/dL 9.3  8.8    Total Protein 6.5 - 8.1 g/dL 7.2  7.0    Total Bilirubin 0.3 - 1.2 mg/dL 0.9  0.6    Alkaline Phos 38 - 126 U/L 86  82    AST 15 - 41 U/L 21  25    ALT 0 - 44 U/L 23  22       RADIOGRAPHIC STUDIES: I have personally reviewed the radiological images as listed and agreed with the findings in the report. CT HEAD WO CONTRAST  Result Date: 07/29/2022 CLINICAL DATA:  Left-sided weakness. History of pancreatic carcinoma. Patient awoke this morning with left-sided weakness and left arm drift. EXAM: CT HEAD WITHOUT CONTRAST TECHNIQUE: Contiguous axial images were obtained from the base of the skull through the vertex without intravenous contrast. RADIATION DOSE REDUCTION: This exam was performed according to the departmental dose-optimization program which  includes automated exposure control, adjustment of the mA and/or kV according to patient size and/or use of iterative reconstruction technique. COMPARISON:  Head CT, 01/20/2021. Brain MRI, 03/18/2021. PET-CT, 07/05/2022. FINDINGS: Brain: Acute focal parenchymal hemorrhage, superomedial left parietal lobe, measuring 2.7 x 1.4 x 2.1 cm, with mild surrounding vasogenic edema, but no significant mass effect. No other intracranial hemorrhage. No evidence of acute infarction. No defined mass or significant mass effect. No extra-axial masses or abnormal fluid collections. There is ventricular greater than sulcal enlargement consistent with atrophy. No hydrocephalus. Vascular: No hyperdense vessel or unexpected calcification. Skull: Normal. Negative for fracture or focal lesion. Sinuses/Orbits: Globes and orbits are unremarkable. Sinuses are essentially clear. Other: None.  IMPRESSION: 1. 2.7 x 1.4 x 2.1 cm focus of acute parenchymal hemorrhage in the superior, medial left parietal lobe. Suspect this to be a hemorrhagic metastatic lesion given the history of metastatic pancreatic carcinoma. Consider follow-up brain MRI without and with contrast for further assessment. Critical Value/emergent results were called by telephone at the time of interpretation on 07/29/2022 at 10:01 am to provider [facility] , who verbally acknowledged these results. 2. No other acute abnormality. No other areas of intracranial hemorrhage. No acute infarction. Electronically Signed   By: Lajean Manes M.D.   On: 07/29/2022 10:01   NM PET Image Initial (PI) Skull Base To Thigh  Result Date: 07/06/2022 CLINICAL DATA:  Initial treatment strategy for pancreatic malignancy. EXAM: NUCLEAR MEDICINE PET SKULL BASE TO THIGH TECHNIQUE: 11.81 mCi F-18 FDG was injected intravenously. Full-ring PET imaging was performed from the skull base to thigh after the radiotracer. CT data was obtained and used for attenuation correction and anatomic localization. Fasting blood glucose: 145 mg/dl COMPARISON:  CT abdomen dated June 11, 2022 FINDINGS: Mediastinal blood pool activity: SUV max 2.0 Liver activity: SUV max 2.7 NECK: No hypermetabolic lymph nodes in the neck. Incidental CT findings: None. CHEST: Enlarged and hypermetabolic left hilar lymph node measuring 2.6 cm in short axis on series 2, image 110 with SUV max of 4.9. bilateral hypermetabolic pulmonary nodules, reference nodule of the right lower lobe measuring 0.8 x 1.4 cm on series 2, image 115 with SUV max of 2.6, not included in the field of view on prior exam. Reference nodule of the left lower lobe measuring 2.0 x 1.6 cm on series 2, image 122 with SUV max of 3.6, unchanged in size when compared with prior. Incidental CT findings: Moderate atherosclerotic disease of the LAD. Normal caliber thoracic aorta with mild calcified plaque. ABDOMEN/PELVIS:  Hypermetabolic uptake centered at the wall of the descending duodenum and pancreatic head with soft tissue lesion measuring approximately 2.9 x 1.6 cm on PET-CT, better seen on prior contrast-enhanced abdominal CT, SUV max of 3.6. Hypermetabolic soft tissue nodule at the left nephrectomy site measuring 2.6 x 2.7 cm on series 2, image 52 an SUV max of 3.3, unchanged in size when compared with prior CT of the abdomen. Incidental CT findings: Status post left nephrectomy and bilateral adrenalectomies. Normal caliber abdominal aorta with mild atherosclerotic disease. SKELETON: No focal hypermetabolic activity to suggest skeletal metastasis. Incidental CT findings: None. IMPRESSION: 1. Hypermetabolic soft tissue lesion centered at the wall of the descending duodenum and pancreatic head, differential considerations include primary malignancy such as duodenal/ampullary or pancreatic, metastatic renal cell carcinoma is also a consideration. 2. Hypermetabolic soft tissue nodule at the left nephrectomy site, concerning for recurrent RCC. 3. Numerous bilateral hypermetabolic pulmonary nodules, consistent with metastatic disease. 4. Enlarged hypermetabolic left hilar lymph  node, consistent with metastatic disease. 5. Status post left nephrectomy and bilateral adrenalectomies. 6. Aortic Atherosclerosis (ICD10-I70.0). Electronically Signed   By: Yetta Glassman M.D.   On: 07/06/2022 16:08   CT PANCREAS ABD W/WO  Result Date: 06/12/2022 CLINICAL DATA:  Follow-up pancreatic mass seen on outside CT scan. EXAM: CT ABDOMEN WITHOUT AND WITH CONTRAST TECHNIQUE: Multidetector CT imaging of the abdomen was performed following the standard protocol before and following the bolus administration of intravenous contrast. RADIATION DOSE REDUCTION: This exam was performed according to the departmental dose-optimization program which includes automated exposure control, adjustment of the mA and/or kV according to patient size and/or use of  iterative reconstruction technique. CONTRAST:  120m OMNIPAQUE IOHEXOL 300 MG/ML  SOLN COMPARISON:  None Available. FINDINGS: Lower chest: Bilateral lower lobe pulmonary nodules consistent with metastatic disease. Hepatobiliary: No hepatic lesions or intrahepatic biliary dilatation. Gallbladder is mildly contracted and contains a small enhancing nodule measuring 10 mm. This could be a gallbladder polyp. Normal caliber and course of the common bile duct. Pancreas: There is a 4.7 x 3.3 cm enhancing mass involving the descending duodenum and region of the ampulla. It is possible this is a pancreatic cancer but I think it is more likely duodenal or ampullary. This would be amenable to endoscopic biopsy for tissue diagnosis. There is no dilatation of the common bile duct or pancreatic duct and I think pancreatic cancer is less likely. It may involve the medial pancreatic head. The remainder of the pancreas is unremarkable. Spleen: Normal size.  No focal lesions. Adrenals/Urinary Tract: Status post right adrenalectomy. Solid enhancing left adrenal gland mass measuring 2.8 x 2.7 cm. This is likely metastatic disease. The right kidney is unremarkable. The left kidney is surgically absent. Stomach/Bowel: The stomach, visualized small bowel and visualized colon are unremarkable. Vascular/Lymphatic: The aorta and branch vessels are patent. The major venous structures are patent. Small scattered peripancreatic and periportal lymph nodes. No retroperitoneal adenopathy. Other: No ascites or abdominal wall hernia. Musculoskeletal: No significant bony findings. IMPRESSION: 1. 4.7 x 3.3 cm enhancing mass involving the descending duodenum and region of the ampulla. It is possible this is a pancreatic cancer but I think it is more likely duodenal or ampullary. This would be amenable to endoscopic biopsy for tissue diagnosis. 2. 2.8 x 2.7 cm left adrenal gland mass likely metastatic disease. 3. Bilateral lower lobe pulmonary nodules  consistent with metastatic disease. 4. Status post right adrenalectomy and left nephrectomy. 5. 10 mm enhancing nodule in the gallbladder could be a gallbladder polyp. Electronically Signed   By: PMarijo SanesM.D.   On: 06/12/2022 13:00     Assessment and plan-   #Acute intracranial bleeding, possible secondary to metastatic lesion. An MRI brain for further evaluation.  Neurosurgery has been consulted and appreciate recommendation. Consult radiation oncology for palliative RT  #long Standing history of metastatic RCC, not currently on any treatments due to patient's preference. #Duodenal/pancreatic lesion, CA 19-9 was normal.  Possible metastatic lesion versus primary lesion.  Patient declines biopsy.    I recommend to consult palliative care service for evaluation of goals of care.   Thank you for allowing me to participate in the care of this patient.   ZEarlie Server MD, PhD Hematology Oncology 07/29/2022 [facility] 100 100

## 2022-07-29 NOTE — Progress Notes (Signed)
OT Cancellation Note  Patient Details Name: Tony Huffman MRN: [id number] DOB: 11/08/1945   Cancelled Treatment:    Reason Eval/Treat Not Completed: Patient not medically ready. OT orders received, chart reviewed. Per neurosurgery note this morning: "Plan for MRI W/ and W/O contrast to evaluate for a lesion, otherwise will consider transfer for hemorrhagic stroke." Pt still pending MRI of brain. Will hold OT at this time. Will re-attempt as able and pt medically appropriate.    Doneta Public 07/29/2022, 12:47 PM

## 2022-07-29 NOTE — Assessment & Plan Note (Signed)
Hold metformin Place patient on a consistent carbohydrate diet once swallow function has been evaluated Blood sugar checks every 4 hours while NPO

## 2022-07-29 NOTE — ED Triage Notes (Signed)
Pt in via EMS from home, independent living at [facility]. Pt woke up at 0745 with left sided weakness, left arm drift noted by EMS. Pt went to sleep at 2200 and woke up like this. Pt uses cane at baseline and drags left leg. No blood thinners or daily aspirin  144/80, HR 80, CBG 173, 99% RA

## 2022-07-29 NOTE — Assessment & Plan Note (Signed)
Patient presents to the ER for evaluation of left upper extremity weakness noted upon awakening on the morning of his admission. CT scan of the head without contrast shows 2.7 x 1.4 x 2.1 cm focus of acute parenchymal hemorrhage in the superior, medial left parietal lobe. Suspect this to be a hemorrhagic metastatic lesion given the history of metastatic pancreatic carcinoma. MRI brain : 2.6 cm enhancing mass lesion in the medial left parietal lobe associated with the acute hemorrhage is compatible with a renal cell carcinoma metastasis. Neurosurgery consulted, patient is not interested in any neurosurgical intervention. Oncology and radiation oncology consulted. Patient was started on radiation therapy today. Patient will be going under hospice services at his facility starting from 11/7 after completing current course of radiation.

## 2022-07-29 NOTE — ED Notes (Signed)
FULL RAINBOW (BLUE, RED, LT GREEN, AND LAV) SENT TO LAB. NO ORDER AT THIS MOMENT.

## 2022-07-29 NOTE — H&P (Signed)
History and Physical    Patient: Tony Huffman IWO:[id number] DOB: 1945/12/01 DOA: 07/29/2022 DOS: the patient was seen and examined on 07/29/2022 PCP: Tonia Ghent, MD  Patient coming from: Home  Chief Complaint:  Chief Complaint  Patient presents with   Weakness   HPI: Tony Huffman is a 76 y.o. 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  Patient now has recurrence of his disease with mets to the lungs, left nephrectomy site and pancreas.  Patient declined repeat biopsy due to previous unsuccessful biopsies at outside facilities.  He has been on hospice but is currently on palliative care. He also has past medical history significant for hypothyroidism, hypertension, obstructive sleep apnea, adrenal insufficiency and diabetes mellitus. He presented to the ER by EMS for evaluation of left upper extremity weakness which he noted when he woke up at about 7:45 AM.  Last known well was about 10 PM last night and patient woke up with left upper extremity weakness.  He fell on the morning of his admission while attempting to ambulate because he was unable to hold onto his rolling walker.  At baseline he uses an assist device and has some left leg weakness. Per EMS he had a left arm drift but his symptoms seem to have improved because he is able to lift his left arm off the bed. He has no headache, no blurred vision, no double vision, no dizziness, no lightheadedness, no difficulty swallowing, no chest pain, no shortness of breath, no abdominal pain, no changes in his bowel habits, no urinary symptoms, no palpitations, He had a CT scan of the head done in the ER which showed 1. 2.7 x 1.4 x 2.1 cm focus of acute parenchymal hemorrhage in the superior, medial left parietal lobe. Suspect this to be a hemorrhagic metastatic lesion given the history of metastatic  pancreatic carcinoma. No other acute abnormality. No other areas of intracranial hemorrhage. No acute infarction. Neurosurgery was consulted in the ER and he recommended an MRI with and without contrast to further evaluate the lesion. Patient will be admitted to the hospital for further evaluation   Review of Systems: As mentioned in the history of present illness. All other systems reviewed and are negative. Past Medical History:  Diagnosis Date   Adrenal insufficiency (HCC)    Anxiety    Chronic kidney disease, stage 3 (HCC) 11/13/2011   Erectile dysfunction    GERD (gastroesophageal reflux disease)    Gout 09/04/2013   H/O atrial flutter 03/07/2017   Hyperlipidemia 06/16/2014   Hypertension    Hypothyroidism (acquired) 04/14/2014   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[place]) 11/10/2010   OSA (obstructive sleep apnea) 07/24/2016   Pancreatic mass 08/01/2018   Renal carcinoma ([place]) 06/16/2015   RLS (restless legs syndrome) 07/24/2016   Sleep apnea    Testicular hypofunction    Tussive syncopes 10/15/2013   Type 2 diabetes mellitus ([place])    Vitamin D deficiency disease    Past Surgical History:  Procedure Laterality Date   ADRENALECTOMY     CATARACT EXTRACTION  10/2014   CATARACT EXTRACTION EXTRACAPSULAR  11/05/2016   with Insertion Intraocular Prostheis.    COLONOSCOPY  05/06/2017   with removal lesions by snare.   CYSTECTOMY     Upper Neck/Chest Area, [facility]   NEPHRECTOMY     POPLITEAL SYNOVIAL CYST EXCISION     [place]  THYROIDECTOMY     patient states they only removed half   TONSILLECTOMY     Social History:  reports that he quit smoking about 45 years ago. His smoking use included cigarettes. He has a 10.00 pack-year smoking history. He has been exposed to tobacco smoke. He has never used smokeless tobacco. He reports that he does not currently use alcohol. He reports that he does not use drugs.  Allergies  Allergen Reactions   Other  Rash    Blisters with bandaids Blisters with bandaids    Tegaderm Ag Mesh [Silver]    Tape Rash    Blisters, rash. Paper tape OK. Blisters, rash. Paper tape OK.     Family History  Problem Relation Age of Onset   Ovarian cancer Mother    Cancer Mother    Cancer Father    Huffman disease Father    Coronary artery disease Father    Hypertension Father    Macular degeneration Father    Huffman disease Brother    Diabetes Brother    Coronary artery disease Brother    Diabetes type II Brother    Coronary artery disease Paternal Grandfather     Prior to Admission medications   Medication Sig Start Date End Date Taking? Authorizing Provider  Accu-Chek Softclix Lancets lancets Use to check blood sugar once daily. Dx: E11.49 Patient not taking: Reported on 06/19/2022 06/30/20   [name], NP  blood glucose meter kit and supplies 1 each by Other route as directed. Dispense based on patient and insurance preference. Use up to four times daily as directed. (FOR ICD-10 E10.9, E11.9). Patient not taking: Reported on 06/19/2022    [provider]  Cholecalciferol 125 MCG (5000 UT) capsule Take 5,000 Units by mouth daily.    [provider]  colchicine 0.6 MG tablet Take 1 tablet (0.6 mg total) by mouth as needed. First sign of Gout Flare. Take 2 tablets ( 1.60m) by mouth at first sign of gout flare followed by 1 tablet ( 0.680m after 1 hour. ( max 1.49m7mithin 1 hours) 05/16/21   EubLauree ChandlerP  cyanocobalamin 1000 MCG tablet Take 1,000 mcg by mouth every other day.    [provider]  cyclobenzaprine (FLEXERIL) 10 MG tablet Take 1 tablet (10 mg total) by mouth 3 (three) times daily as needed for muscle spasms. 06/14/22   PatFelipa FurnacePM  Fludrocortisone Acetate (FLORINEF PO) Take 0.1 mg by mouth every other day. On alternate days take 1/2 tablet.    [provider]  furosemide (LASIX) 20 MG tablet Take 20 mg by mouth as needed. For Edema.     [provider]  gabapentin (NEURONTIN) 600 MG tablet Take 900 mg by mouth daily. Takes 1 and 1/2 tablet by mouth nightly.    [provider]  glucose blood (ACCU-CHEK AVIVA PLUS) test strip Use to test blood sugar once daily. Dx: E11.49 Patient not taking: Reported on 06/19/2022 06/30/20   EubLauree ChandlerP  hydrocortisone (CORTEF) 10 MG tablet Take by mouth daily. Takes 1 and 1/2 tablet every morning, 1/2 tablet every afternoon, and 1/2 tablet every evening. Take double dose as directed for stress, may take up to 85 tablets monthly.    [provider]  levothyroxine (SYNTHROID) 75 MCG tablet Take 75 mcg by mouth daily.    [provider]  metFORMIN (GLUMETZA) 500 MG (MOD) 24 hr tablet Take 500 mg by mouth daily.    [provider]  mirabegron ER (MYRBETRIQ) 50 MG TB24 tablet Take 1 tablet (50 mg total) by mouth daily. 05/29/22   Billey Co, MD  Multiple Vitamins-Minerals (PRESERVISION AREDS 2 PO) Take 1 capsule by mouth in the morning and at bedtime.    [provider]  PSYLLIUM HUSK PO Take 3.4 g by mouth every evening.    [provider]    Physical Exam: Vitals:   07/29/22 1000 07/29/22 1030 07/29/22 1100 07/29/22 1112  BP: 126/74 128/77 125/74 125/74  Pulse: 75 75 76 76  Resp:    18  Temp:    98.3 F (36.8 C)  TempSrc:    Oral  SpO2: 100% 100% 98% 98%  Weight:      Height:       Physical Exam Vitals and nursing note reviewed.  Constitutional:      Appearance: He is obese.     Comments: Chronically ill-appearing  HENT:     Head: Normocephalic and atraumatic.     Nose: Nose normal.     Mouth/Throat:     Mouth: Mucous membranes are moist.  Eyes:     Conjunctiva/sclera: Conjunctivae normal.  Cardiovascular:     Rate and Rhythm: Normal rate and regular rhythm.  Pulmonary:     Effort: Pulmonary effort is normal.     Breath sounds: Normal breath sounds.  Abdominal:     General: Bowel sounds are normal.      Palpations: Abdomen is soft.  Musculoskeletal:     Cervical back: Normal range of motion and neck supple.     Right lower leg: Edema present.     Left lower leg: Edema present.  Skin:    General: Skin is warm and dry.  Neurological:     Mental Status: He is alert and oriented to person, place, and time.     Comments: Able to move left upper extremity off the bed. Power 3-4/5 in intensity  Psychiatric:        Mood and Affect: Mood normal.        Behavior: Behavior normal.     Data Reviewed: Relevant notes from primary care and specialist visits, past discharge summaries as available in EHR, including Care Everywhere. Prior diagnostic testing as pertinent to current admission diagnoses Updated medications and problem lists for reconciliation ED course, including vitals, labs, imaging, treatment and response to treatment Triage notes, nursing and pharmacy notes and ED provider's notes Notable results as noted in HPI Labs reviewed.  PT 14.2, INR 1.1, sodium 137, potassium 4.2, chloride 103, bicarb 25, glucose 164, BUN 24, creatinine 1.02, calcium 9.3, total protein 7.2, albumin 3.9, AST 21, ALT 23, alkaline phosphatase 86, total bilirubin 0.9, white count 6.4, hemoglobin 14.1, hematocrit 43.1, platelet count 284 Twelve-lead EKG reviewed by me shows sinus rhythm, prolonged PR interval, left anterior fascicular block There are no new results to review at this time.  Assessment and Plan: * Cerebral parenchymal hemorrhage [facility]) Patient presents to the ER for evaluation of left upper extremity weakness noted upon awakening on the morning of his admission. CT scan of the head without contrast shows 2.7 x 1.4 x 2.1 cm focus of acute parenchymal hemorrhage in the superior, medial left parietal lobe. Suspect this to be a hemorrhagic metastatic lesion given the history of metastatic pancreatic carcinoma. Follow-up results of MRI of the brain Neurosurgery consult  Renal cell carcinoma, left  [facility]) Patient has a known history of metastatic left renal cell carcinoma and is status  post left nephrectomy and bilateral adrenalectomy with new metastatic disease to the pancreas, lungs and nephrectomy bed. Oncology consult  Diabetes mellitus without complication ([place]) Hold metformin Place patient on a consistent carbohydrate diet once swallow function has been evaluated Blood sugar checks every 4 hours while NPO  Hypothyroidism (acquired) Continue Synthroid  Addisons disease ([place]) Continue Florinef and hydrocortisone      Advance Care Planning:   Code Status: DNR   Consults: Neurosurgery, oncology  Family Communication: Greater than 50% of time spent discussing plan of care with patient and his wife at the bedside.  All questions and concerns have been addressed.  They verbalized understanding.  CODE STATUS was discussed and he wishes to be a DNR.  He lists his wife Tony Huffman as his healthcare power of attorney  Severity of Illness: The appropriate patient status for this patient is INPATIENT. Inpatient status is judged to be reasonable and necessary in order to provide the required intensity of service to ensure the patient's safety. The patient's presenting symptoms, physical exam findings, and initial radiographic and laboratory data in the context of their chronic comorbidities is felt to place them at high risk for further clinical deterioration. Furthermore, it is not anticipated that the patient will be medically stable for discharge from the hospital within 2 midnights of admission.   * I certify that at the point of admission it is my clinical judgment that the patient will require inpatient hospital care spanning beyond 2 midnights from the point of admission due to high intensity of service, high risk for further deterioration and high frequency of surveillance required.*  Author: Collier Bullock, MD 07/29/2022 11:55 AM  For on call review [email or web address].

## 2022-07-29 NOTE — Progress Notes (Signed)
PT Cancellation Note  Patient Details Name: Tony Huffman MRN: [id number] DOB: 1946/03/04   Cancelled Treatment:    Reason Eval/Treat Not Completed: Patient not medically ready.  PT consult received.  Chart reviewed.  Per neurosurgery note this morning: "Plan for MRI W/ and W/0 contrast to evaluate for a lesion, otherwise will consider transfer for hemorrhagic stroke."  Pt still pending MRI of brain.  Will hold PT at this time and monitor pt's status (and re-attempt PT evaluation at a later date/time as medically appropriate).  Leitha Bleak, PT 07/29/22, 12:40 PM

## 2022-07-29 NOTE — Consult Note (Signed)
Consulting Department:  ED  Primary Physician:  Tonia Ghent, MD  Chief Complaint: Hemorrhagic mass  History of Present Illness: 07/29/2022 Tony Huffman is a 76 y.o. male who presents with the chief complaint of a left-sided hemorrhagic mass.  He has a history of renal cell carcinoma status post nephrectomy and chemotherapy.  He also had bilateral adrenalectomy.  He was recently diagnosed with a recurrence of his cancer versus new primary and was supposed to have an outpatient palliative care evaluation today.  He came into the hospital due to left-sided weakness.  He states that he was having trouble utilizing his rollator.  Notably does have a history of chronic weakness on the left side that has been present for at least 6 to 7 years.  He had a mild headache but this is improved.  Review of Systems:  A 10 point review of systems is negative, except for the pertinent positives and negatives detailed in the HPI.  Past Medical History: Past Medical History:  Diagnosis Date   Adrenal insufficiency ([place])    Anxiety    Chronic kidney disease, stage 3 ([place]) 11/13/2011   Erectile dysfunction    GERD (gastroesophageal reflux disease)    Gout 09/04/2013   H/O atrial flutter 03/07/2017   Hyperlipidemia 06/16/2014   Hypertension    Hypothyroidism (acquired) 04/14/2014   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[place]) 11/10/2010   OSA (obstructive sleep apnea) 07/24/2016   Pancreatic mass 08/01/2018   Renal carcinoma ([place]) 06/16/2015   RLS (restless legs syndrome) 07/24/2016   Sleep apnea    Testicular hypofunction    Tussive syncopes 10/15/2013   Type 2 diabetes mellitus ([place])    Vitamin D deficiency disease     Past Surgical History: Past Surgical History:  Procedure Laterality Date   ADRENALECTOMY     CATARACT EXTRACTION  10/2014   CATARACT EXTRACTION EXTRACAPSULAR  11/05/2016   with Insertion Intraocular Prostheis.    COLONOSCOPY  05/06/2017   with removal lesions by snare.    CYSTECTOMY     Upper Neck/Chest Area, [facility]   NEPHRECTOMY     POPLITEAL SYNOVIAL CYST EXCISION     SPINE SURGERY  1989   THYROIDECTOMY     patient states they only removed half   TONSILLECTOMY      Allergies: Allergies as of 07/29/2022 - Review Complete 07/29/2022  Allergen Reaction Noted   Other Rash 04/15/2014   Tegaderm ag mesh [silver]  01/12/2020   Tape Rash 02/06/2013    Medications:  Current Facility-Administered Medications:    [START ON 07/30/2022]  stroke: early stages of recovery book, , Does not apply, Once, Agbata, Tochukwu, MD   0.9 %  sodium chloride infusion, 250 mL, Intravenous, PRN, Agbata, Tochukwu, MD   acetaminophen (TYLENOL) tablet 650 mg, 650 mg, Oral, Q6H PRN **OR** acetaminophen (TYLENOL) suppository 650 mg, 650 mg, Rectal, Q6H PRN, Agbata, Tochukwu, MD   cholecalciferol (VITAMIN D3) 25 MCG (1000 UNIT) tablet 5,000 Units, 5,000 Units, Oral, Daily, Agbata, Tochukwu, MD, 5,000 Units at 07/29/22 1421   colchicine tablet 0.6 mg, 0.6 mg, Oral, Daily PRN, Agbata, Tochukwu, MD   cyanocobalamin (VITAMIN B12) tablet 1,000 mcg, 1,000 mcg, Oral, QODAY, Agbata, Tochukwu, MD, 1,000 mcg at 07/29/22 1421   cyclobenzaprine (FLEXERIL) tablet 10 mg, 10 mg, Oral, TID PRN, Agbata, Tochukwu, MD   fludrocortisone (FLORINEF) tablet 0.05 mg, 0.05 mg, Oral, QODAY, Agbata, Tochukwu, MD, 0.05 mg at 07/29/22 1533   [START ON 07/30/2022] fludrocortisone (FLORINEF) tablet 0.1 mg, 0.1  mg, Oral, QODAY, Agbata, Tochukwu, MD   gabapentin (NEURONTIN) capsule 900 mg, 900 mg, Oral, QHS, Agbata, Tochukwu, MD   [START ON 07/30/2022] hydrocortisone (CORTEF) tablet 15 mg, 15 mg, Oral, QAC breakfast, Agbata, Tochukwu, MD   hydrocortisone (CORTEF) tablet 5 mg, 5 mg, Oral, BID AC, Benita Gutter, RPH, 5 mg at 07/29/22 1746   insulin aspart (novoLOG) injection 0-15 Units, 0-15 Units, Subcutaneous, TID WC, Agbata, Tochukwu, MD, 5 Units at 07/29/22 1630   [START ON 07/30/2022]  levothyroxine (SYNTHROID) tablet 75 mcg, 75 mcg, Oral, Q0600, Agbata, Tochukwu, MD   [START ON 07/30/2022] mirabegron ER (MYRBETRIQ) tablet 50 mg, 50 mg, Oral, Daily, Agbata, Tochukwu, MD   [START ON 07/30/2022] multivitamin-lutein (OCUVITE-LUTEIN) capsule 1 capsule, 1 capsule, Oral, Daily, Agbata, Tochukwu, MD   ondansetron (ZOFRAN) tablet 4 mg, 4 mg, Oral, Q6H PRN **OR** ondansetron (ZOFRAN) injection 4 mg, 4 mg, Intravenous, Q6H PRN, Agbata, Tochukwu, MD   psyllium (HYDROCIL/METAMUCIL) 1 packet, 1 packet, Oral, Daily PRN, Agbata, Tochukwu, MD   sodium chloride flush (NS) 0.9 % injection 3 mL, 3 mL, Intravenous, Q12H, Agbata, Tochukwu, MD, 3 mL at 07/29/22 1632   sodium chloride flush (NS) 0.9 % injection 3 mL, 3 mL, Intravenous, PRN, Agbata, Tochukwu, MD, 3 mL at 07/29/22 1532  Current Outpatient Medications:    Cholecalciferol 125 MCG (5000 UT) capsule, Take 5,000 Units by mouth daily., Disp: , Rfl:    cyanocobalamin 1000 MCG tablet, Take 1,000 mcg by mouth every other day., Disp: , Rfl:    fludrocortisone (FLORINEF) 0.1 MG tablet, Take 0.05-0.1 mg by mouth daily. (Alternate dose every other day), Disp: , Rfl:    gabapentin (NEURONTIN) 600 MG tablet, Take 900 mg by mouth at bedtime., Disp: , Rfl:    hydrocortisone (CORTEF) 10 MG tablet, Take 5-15 mg by mouth See admin instructions. Take 1 tablets ('15mg'$ ) by mouth every morning,  tablet ('5mg'$ ) by mouth daily at lunchtime and take  tablet ('5mg'$ ) by mouth every evening at dinner, Disp: , Rfl:    levothyroxine (SYNTHROID) 75 MCG tablet, Take 75 mcg by mouth daily., Disp: , Rfl:    metFORMIN (GLUCOPHAGE) 500 MG tablet, Take 500 mg by mouth daily., Disp: , Rfl:    mirabegron ER (MYRBETRIQ) 50 MG TB24 tablet, Take 1 tablet (50 mg total) by mouth daily., Disp: 90 tablet, Rfl: 3   Multiple Vitamins-Minerals (PRESERVISION AREDS 2 PO), Take 1 capsule by mouth in the morning and at bedtime., Disp: , Rfl:    PSYLLIUM HUSK PO, Take 3.4 g by mouth every  evening., Disp: , Rfl:    Accu-Chek Softclix Lancets lancets, Use to check blood sugar once daily. Dx: E11.49 (Patient not taking: Reported on 06/19/2022), Disp: 100 each, Rfl: 3   cyclobenzaprine (FLEXERIL) 10 MG tablet, Take 1 tablet (10 mg total) by mouth 3 (three) times daily as needed for muscle spasms. (Patient not taking: Reported on 07/29/2022), Disp: 30 tablet, Rfl: 0   glucose blood (ACCU-CHEK AVIVA PLUS) test strip, Use to test blood sugar once daily. Dx: E11.49 (Patient not taking: Reported on 06/19/2022), Disp: 100 each, Rfl: 3   Social History: Social History   Tobacco Use   Smoking status: Former    Packs/day: 1.00    Years: 10.00    Total pack years: 10.00    Types: Cigarettes    Quit date: 1978    Years since quitting: 45.8    Passive exposure: Past   Smokeless tobacco: Never  Vaping Use   Vaping  Use: Never used  Substance Use Topics   Alcohol use: Not Currently   Drug use: Never    Family Medical History: Family History  Problem Relation Age of Onset   Ovarian cancer Mother    Cancer Mother    Cancer Father    Heart disease Father    Coronary artery disease Father    Hypertension Father    Macular degeneration Father    Heart disease Brother    Diabetes Brother    Coronary artery disease Brother    Diabetes type II Brother    Coronary artery disease Paternal Grandfather     Physical Examination: Vitals:   07/29/22 1630 07/29/22 2007  BP: 121/82 (!) 141/79  Pulse: 88 88  Resp: 18 18  Temp: 98.3 F (36.8 C) 98.2 F (36.8 C)  SpO2: 92% 95%     General: Patient is well developed, well nourished, calm, collected, and in no apparent distress.  NEUROLOGICAL:  General: In no acute distress.   Awake, alert, oriented to person, place.  Pupils equal round and reactive to light, although there appears to be a slight lid height difference on the right greater than the left.  Full Facial tone is symmetric.  Tongue protrusion is midline.  Bilateral upper  extremities are full strength proximally and distally.  He does have an upward drift on the left with eyes closed language is conversant.  GCS: 15   Bilateral upper and lower extremity sensation is intact to light touch.  Interestingly, he does have some difficulty with double simultaneous stimulation showing some difficulty with proprioception.  This is not a severe deficit as it appears to be somewhat mild  Imaging:   Narrative & Impression  CLINICAL DATA:  Multiple falls abnormal balance.   EXAM: MRI HEAD WITHOUT CONTRAST   TECHNIQUE: Multiplanar, multiecho pulse sequences of the brain and surrounding structures were obtained without intravenous contrast.   COMPARISON:  CT head without contrast 01/20/2021   FINDINGS: Brain: Moderate generalized atrophy is present. 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 Ventricles are enlarged bilaterally. There is mild ventricular asymmetry, the right is larger.   No acute infarct, hemorrhage, or mass lesion is present. No other significant extra-axial collection is present.   The internal auditory canals are within normal limits. The brainstem and cerebellum are within normal limits.   Vascular: Flow is present in the major intracranial arteries.   Skull and upper cervical spine: The craniocervical junction is normal. Upper cervical spine is within normal limits. Marrow signal is unremarkable.   Sinuses/Orbits: The paranasal sinuses and mastoid air cells are clear. Bilateral lens replacements are noted. Globes and orbits are otherwise unremarkable.   IMPRESSION: 1. No acute intracranial abnormality. 2. Moderate generalized atrophy and white matter disease likely reflects the sequela of chronic microvascular ischemia. 3. Arachnoid cyst over the left convexity.     Electronically Signed   By: San Morelle M.D.   On: 03/19/2021 16:16   I have personally reviewed the images and agree with the above  interpretation.  Labs:    Latest Ref Rng & Units 07/29/2022    9:05 AM 06/19/2022   10:45 AM 05/07/2022   12:00 AM  CBC  WBC 4.0 - 10.5 K/uL 6.4  8.0  7.4      Hemoglobin 13.0 - 17.0 g/dL 14.1  13.7  14.2      Hematocrit 39.0 - 52.0 % 43.1  41.4  43      Platelets 150 -  400 K/uL 284  264  292         This result is from an external source.       Assessment and Plan: Mr. Soules is a pleasant 76 y.o. male with with a history of nephrectomy and bilateral adrenalectomy.  On chronic steroid replacement.  He has had a recent diagnosis of recurrence versus new primary.  New lesions in his duodenum, lungs, liver, and pancreas.  He does have a history of longstanding weakness on the left upper and lower extremity although this was never given to him by a clear diagnosis.  He states that he woke up this morning and is having an excess amount of weakness on his left side which was worse than his normal range.  He came to the emergency department was found to have a left-sided parietal location intraparenchymal hemorrhage however given his history and MRI was performed and demonstrated a hemorrhagic mass.  According to his wife his physical exam has improved considerably since being here in the hospital.  On physical exam he does have an upward drift on the left and some difficulty with proprioception and double simultaneous stimulation.  His lesion was noted to be in the left hand this does not clearly localize to his presentation, however could have potentially been some recrudescence of his left-sided weakness after his hemorrhage.  At this point the lesion would potentially be amenable to stereotactic radiosurgery given its size, will defer that decision to oncology/radiation oncology.  He like to discuss his case with the palliative care team, after which she would make decisions, however he is stating that he would be unlikely to be interested in any neurosurgical intervention, potentially may be  interested in radiation.    Stevan Born, MD/MSCR Dept. of Neurosurgery

## 2022-07-29 NOTE — ED Provider Notes (Signed)
[facility] Provider Note    Event Date/Time   First MD Initiated Contact with Patient 07/29/22 [id number]     (approximate)   History   Weakness   HPI  Tony Huffman is a 76 y.o. 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  States that he woke up this morning at 8:00 and was having difficulty moving his left side of his body.  States that he is having difficulty using his left arm and walking.  No prior history.  Went to bed at 10 PM.  Did not wake up in the middle of the night.  No history of CVA.  Denies any headache or slurring of speech.  No difficulty swallowing.  Denies any numbness.  Not on anticoagulation.  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  Feels that he is slightly been able to move his arm and hand on the left side better than he was able to this morning.    After further conversation with the patient and his wife tomorrow at bedside.  Patient has been undergoing cancer treatment and work-up over the past 12 years.  Patient had recently been told that he had a pancreatic mass however they ultimately opted not to biopsy it secondary to not wanting to undergo further cancer treatment.  Has a palliative care/hospice consultation and for tomorrow.  Patient does not believe that he would want surgery but he is not certain at this time.  Physical Exam   Triage Vital Signs: ED Triage Vitals  Enc Vitals Group     BP 07/29/22 0909 (!) 144/94     Pulse Rate 07/29/22 0909 82     Resp 07/29/22 0909 18     Temp --      Temp src --      SpO2 07/29/22 0909 96 %     Weight 07/29/22 0912 200 lb (90.7 kg)     Height 07/29/22 0912 '5\' 8"'$  (1.727 m)     Head Circumference --      Peak Flow --      Pain Score  07/29/22 0912 3     Pain Loc --      Pain Edu? --      Excl. in [place]? --     Most recent vital signs: Vitals:   07/29/22 1100 07/29/22 1112  BP: 125/74 125/74  Pulse: 76 76  Resp:  18  Temp:  98.3 F (36.8 C)  SpO2: 98% 98%    Physical Exam Constitutional:      Appearance: He is well-developed.  HENT:     Head: Atraumatic.  Eyes:     Conjunctiva/sclera: Conjunctivae normal.  Cardiovascular:     Rate and Rhythm: Regular rhythm.  Pulmonary:     Effort: No respiratory distress.  Musculoskeletal:     Cervical back: Normal range of motion.  Skin:    General: Skin is warm.  Neurological:     Mental Status: He is alert. Mental status is at baseline.     GCS: GCS eye subscore is 4. GCS verbal subscore is 5. GCS motor subscore is 6.     Cranial Nerves: Cranial nerves 2-12 are intact.     Sensory: Sensation is intact.     Motor: Weakness (4/5 strength to the left upper  extremity.  Poor grip strength. 4+/5 strength to the left lower extremity.  5/5 strength bilateral upper and lower extremities.  Positive pronator drift.) present.          IMPRESSION / MDM / ASSESSMENT AND PLAN / ED COURSE  I reviewed the triage vital signs and the nursing notes.  76 year old male presents to the emergency department with strokelike symptoms with left-sided weakness.  On arrival afebrile, hemodynamically stable.  Patient had some improvement of his left-sided weakness and he is outside of the window for TNK given that his last known normal was 11 hours prior.  No concern for LVO given NIH scale of 2.  Differential includes metastasis or intracranial hemorrhage.  Do not feel that the patient meets criteria for code stroke.  Patient was immediately taken for CT scanner of CT head.  Will obtain lab work.   EKG  My interpretation of the EKG -normal sinus rhythm.  Normal intervals.  No chamber enlargement.  No significant ST elevation or depression.  No signs of acute ischemia or  dysrhythmia.  No tachycardic or bradycardic dysrhythmias while on cardiac telemetry.  RADIOLOGY I independently reviewed imaging, my interpretation of imaging: CT head without contrast clinical picture concerning for intraparenchymal hemorrhage.  Concern for underlying malignancy.  Discussed the findings with the radiologist on-call.      ED Results / Procedures / Treatments   Labs (all labs ordered are listed, but only abnormal results are displayed) Labs interpreted as -    Labs Reviewed  COMPREHENSIVE METABOLIC PANEL - Abnormal; Notable for the following components:      Result Value   Glucose, Bld 164 (*)    BUN 24 (*)    All other components within normal limits  CBG MONITORING, ED - Abnormal; Notable for the following components:   Glucose-Capillary 170 (*)    All other components within normal limits  PROTIME-INR  APTT  CBC  DIFFERENTIAL  URINALYSIS, ROUTINE W REFLEX MICROSCOPIC  HEMOGLOBIN A1C    Consulted neurosurgery and discussed the patient's case with Dr. Tamala Julian who reviewed the images.  Recommended MRI with and without contrast.  Concern for underlying metastasis with hemorrhage.  Recommended admission to the hospitalist.  Consulted Dr. Francine Graven, also on a neuro involved, reach out discussed the patient's case with neurology.  States that they will further discuss with the hospitalist.  Patient was given IV Decadron given his vasogenic edema on CT scan.   PROCEDURES:  Critical Care performed:   Procedures  Total critical care time: 55 minutes  Critical care time was exclusive of separately billable procedures and treating other patients.  Critical care was necessary to treat or prevent imminent or life-threatening deterioration.  Critical care was time spent personally by me on the following activities: development of treatment plan with patient and/or surrogate as well as nursing, discussions with consultants, evaluation of patient's response to treatment,  examination of patient, obtaining history from patient or surrogate, ordering and performing treatments and interventions, ordering and review of laboratory studies, ordering and review of radiographic studies, pulse oximetry and re-evaluation of patient's condition.   Patient's presentation is most consistent with acute presentation with potential threat to life or bodily function.   MEDICATIONS ORDERED IN ED: Medications  colchicine tablet 0.6 mg (has no administration in time range)  hydrocortisone (CORTEF) tablet 10 mg (has no administration in time range)  levothyroxine (SYNTHROID) tablet 75 mcg (has no administration in time range)  Psyllium Husk 100 % POWD 3.4 g (has  no administration in time range)  mirabegron ER (MYRBETRIQ) tablet 50 mg (has no administration in time range)  cyanocobalamin (VITAMIN B12) tablet 1,000 mcg (has no administration in time range)  fludrocortisone (FLORINEF) tablet 0.1 mg (has no administration in time range)  cyclobenzaprine (FLEXERIL) tablet 10 mg (has no administration in time range)  gabapentin (NEURONTIN) tablet 900 mg (has no administration in time range)  Cholecalciferol 5,000 Units (has no administration in time range)  PreserVision AREDS 2 CAPS (has no administration in time range)  insulin aspart (novoLOG) injection 0-15 Units (has no administration in time range)  sodium chloride flush (NS) 0.9 % injection 3 mL (has no administration in time range)  sodium chloride flush (NS) 0.9 % injection 3 mL (has no administration in time range)  0.9 %  sodium chloride infusion (has no administration in time range)  ondansetron (ZOFRAN) tablet 4 mg (has no administration in time range)    Or  ondansetron (ZOFRAN) injection 4 mg (has no administration in time range)  acetaminophen (TYLENOL) tablet 650 mg (has no administration in time range)    Or  acetaminophen (TYLENOL) suppository 650 mg (has no administration in time range)   stroke: early stages of  recovery book (has no administration in time range)  dexamethasone (DECADRON) injection 10 mg (10 mg Intravenous Given 07/29/22 1138)    FINAL CLINICAL IMPRESSION(S) / ED DIAGNOSES   Final diagnoses:  Left-sided muscle weakness  Intraparenchymal hemorrhage of brain [facility])     Rx / DC Orders   ED Discharge Orders          Ordered    MR BRAIN W WO CONTRAST        07/29/22 1022             Note:  This document was prepared using Dragon voice recognition software and may include unintentional dictation errors.   Nathaniel Man, MD 07/29/22 1141

## 2022-07-29 NOTE — ED Triage Notes (Signed)
Patient reported in triage after awakening he was not able to use his left arm.   Once in a room with MD Mumma, patient states when he went to bed he felt normal and upon awakening this AM his left arm and left leg felt heavy and weak. Reports some improvement of left arm. Patient reports difficulty walking this AM. Patient had difficulty moving from wheelchair to stretcher due to weakness/heaviness of left leg

## 2022-07-30 ENCOUNTER — Other Ambulatory Visit: Payer: Medicare Other | Admitting: Student

## 2022-07-30 ENCOUNTER — Encounter: Payer: Self-pay | Admitting: Licensed Clinical Social Worker

## 2022-07-30 ENCOUNTER — Ambulatory Visit: Payer: Medicare Other | Attending: Radiation Oncology | Admitting: Radiation Oncology

## 2022-07-30 ENCOUNTER — Ambulatory Visit: Payer: Medicare Other

## 2022-07-30 ENCOUNTER — Inpatient Hospital Stay: Payer: Medicare Other | Admitting: Licensed Clinical Social Worker

## 2022-07-30 ENCOUNTER — Encounter (INDEPENDENT_AMBULATORY_CARE_PROVIDER_SITE_OTHER): Payer: Self-pay

## 2022-07-30 DIAGNOSIS — E271 Primary adrenocortical insufficiency: Secondary | ICD-10-CM

## 2022-07-30 DIAGNOSIS — C7802 Secondary malignant neoplasm of left lung: Secondary | ICD-10-CM

## 2022-07-30 DIAGNOSIS — I618 Other nontraumatic intracerebral hemorrhage: Secondary | ICD-10-CM | POA: Diagnosis not present

## 2022-07-30 DIAGNOSIS — C642 Malignant neoplasm of left kidney, except renal pelvis: Secondary | ICD-10-CM

## 2022-07-30 LAB — GLUCOSE, CAPILLARY
Glucose-Capillary: 218 mg/dL — ABNORMAL HIGH (ref 70–99)
Glucose-Capillary: 292 mg/dL — ABNORMAL HIGH (ref 70–99)
Glucose-Capillary: 281 mg/dL — ABNORMAL HIGH (ref 70–99)
Glucose-Capillary: 260 mg/dL — ABNORMAL HIGH (ref 70–99)

## 2022-07-30 LAB — LIPID PANEL
Cholesterol: 173 mg/dL (ref 0–200)
HDL: 31 mg/dL — ABNORMAL LOW (ref >40)
LDL Cholesterol: 90 mg/dL (ref 0–99)
Total CHOL/HDL Ratio: 5.6 RATIO
Triglycerides: 260 mg/dL — ABNORMAL HIGH (ref <150)
VLDL: 52 mg/dL — ABNORMAL HIGH (ref 0–40)

## 2022-07-30 NOTE — Progress Notes (Signed)
[facility]) Hospital Liaison note:  This patient is currently enrolled in [facility] outpatient-based Palliative Care. Will continue to follow for disposition.  Please call with any outpatient palliative questions or concerns.  Thank you, Lorelee Market, LPN [facility] Liaison [phone]

## 2022-07-30 NOTE — Evaluation (Signed)
Physical Therapy Evaluation Patient Details Name: Tony Huffman MRN: [id number] DOB: 29-Jul-1946 Today's Date: 07/30/2022  History of Present Illness  Patient is a  76 y.o. male who has left-sided hemorrhagic mass.  He has a history of renal cell carcinoma status post nephrectomy and chemotherapy. History of chronic weakness on the left side that has been present for at least 6 to 7 years. Presented to the ED with L side weakness that was worse than normal.   Clinical Impression  Patient is agreeable to PT evaluation. He goes to outpatient PT recently for history of falls. He is usually ambulatory with rollator at baseline and lives at home with his supportive spouse at [facility] independent living.   Today, the patient reports significant improvements in his left side weakness compared to yesterday. He required minimal assistance for bed mobility, sit to stand transfers from the bed, and for ambulating a short distance with rolling walker. He was able to maintain left grip strength on the rolling walker. Sitting balance and standing balance are intermittently poor with posterior lean. He requires increased time for processing but is able to follow single step commands consistently. The patient and spouse would like to go to SNF at [facility] with the goal of returning home eventually. Recommend to continue PT to maximize independence and facilitate return to prior level of function.      Recommendations for follow up therapy are one component of a multi-disciplinary discharge planning process, led by the attending physician.  Recommendations may be updated based on patient status, additional functional criteria and insurance authorization.  Follow Up Recommendations Skilled nursing-short term rehab (<3 hours/day)      Assistance Recommended at Discharge Frequent or constant Supervision/Assistance  Patient can return home with the following  A little help with walking and/or transfers;A little  help with bathing/dressing/bathroom;Assist for transportation    Equipment Recommendations None recommended by PT  Recommendations for Other Services       Functional Status Assessment Patient has had a recent decline in their functional status and demonstrates the ability to make significant improvements in function in a reasonable and predictable amount of time.     Precautions / Restrictions Precautions Precautions: Fall Restrictions Weight Bearing Restrictions: No      Mobility  Bed Mobility Overal bed mobility: Needs Assistance Bed Mobility: Supine to Sit, Sit to Supine     Supine to sit: Min assist Sit to supine: Min assist   General bed mobility comments: increased time required to complete tasks.    Transfers Overall transfer level: Needs assistance Equipment used: Rolling walker (2 wheels) Transfers: Sit to/from Stand Sit to Stand: Min assist           General transfer comment: steadying assistance provided for standing. increased time and effort needed    Ambulation/Gait Ambulation/Gait assistance: Min assist, Min guard Gait Distance (Feet): 25 Feet Assistive device: Rolling walker (2 wheels) Gait Pattern/deviations: Decreased stride length, Trunk flexed, Knee flexed in stance - left, Knee flexed in stance - right Gait velocity: decreased     General Gait Details: occasional steadying assistance provided with ambulation. verbal cues to stand closer to rolling walker for support and avoid trunk flexion. no dizziness is reported with upright activity.  Stairs            Wheelchair Mobility    Modified Rankin (Stroke Patients Only)       Balance Overall balance assessment: Needs assistance, History of Falls Sitting-balance support: Feet supported Sitting balance-Leahy  Scale: Poor (poor progressing to fair) Sitting balance - Comments: posterior trunk lean occasionally with dyanmic activity. cues to maintain midline posture. Postural  control: Posterior lean Standing balance support: Bilateral upper extremity supported, During functional activity, Reliant on assistive device for balance Standing balance-Leahy Scale: Poor (poor progressing to fair) Standing balance comment: initially relying on bed for posterior leg support. balance improves with increased standing time                             Pertinent Vitals/Pain Pain Assessment Pain Assessment: No/denies pain    Home Living Family/patient expects to be discharged to:: Private residence Living Arrangements: Spouse/significant other Available Help at Discharge: Family;Available PRN/intermittently (spouse will b e out of town this weekend coming up) Type of Home: House Home Access: Level entry       Home Layout: One level Home Equipment: Rollator (4 wheels);Standard Walker;Cane - single point;Transport chair      Prior Function Prior Level of Function : Independent/Modified Independent;History of Falls (last six months)             Mobility Comments: history of multiple falls. patient goes to oupatient PT reguarly for balance issues. He is Mod I at baseline with rollator ADLs Comments: usually Mod I with increased assistance required over the past several days with ADLs     Hand Dominance   Dominant Hand: Right    Extremity/Trunk Assessment   Upper Extremity Assessment Upper Extremity Assessment: LUE deficits/detail (history of chronic weakness) LUE Deficits / Details: shoulder flexion 4+/5, slightly weaker grip strength when compared to right. elbow flexion/extension 4+/5. see OT note for more details LUE Sensation: decreased light touch (on the palm of his hand)    Lower Extremity Assessment Lower Extremity Assessment: LLE deficits/detail (RLE WNL) LLE Deficits / Details: hip add/abd 4+/5 LLE Sensation: WNL       Communication   Communication: No difficulties  Cognition Arousal/Alertness: Awake/alert Behavior During Therapy:  WFL for tasks assessed/performed Overall Cognitive Status: Within Functional Limits for tasks assessed                                 General Comments: patient is able to follow all commands with increased time. increased time for processing        General Comments General comments (skin integrity, edema, etc.): heart rate 78bpm at rest.    Exercises     Assessment/Plan    PT Assessment Patient needs continued PT services  PT Problem List Decreased strength;Decreased activity tolerance;Decreased balance;Decreased mobility;Decreased cognition;Decreased knowledge of use of DME       PT Treatment Interventions DME instruction;Gait training;Therapeutic activities;Stair training;Functional mobility training;Therapeutic exercise;Balance training;Neuromuscular re-education;Patient/family education;Cognitive remediation    PT Goals (Current goals can be found in the Care Plan section)  Acute Rehab PT Goals Patient Stated Goal: to get radiation, to eventually return home PT Goal Formulation: With patient/family Time For Goal Achievement: 08/13/22 Potential to Achieve Goals: Good    Frequency 7X/week     Co-evaluation               AM-PAC PT "6 Clicks" Mobility  Outcome Measure Help needed turning from your back to your side while in a flat bed without using bedrails?: A Little Help needed moving from lying on your back to sitting on the side of a flat bed without using bedrails?: A Little Help  needed moving to and from a bed to a chair (including a wheelchair)?: A Little Help needed standing up from a chair using your arms (e.g., wheelchair or bedside chair)?: A Little Help needed to walk in hospital room?: A Little Help needed climbing 3-5 steps with a railing? : Total 6 Click Score: 16    End of Session Equipment Utilized During Treatment: Gait belt Activity Tolerance: Patient tolerated treatment well Patient left: in bed;with call bell/phone within  reach;with bed alarm set;with SCD's reapplied;with family/visitor present (supportive spouse at bedside) Nurse Communication: Mobility status PT Visit Diagnosis: Muscle weakness (generalized) (M62.81);Other abnormalities of gait and mobility (R26.89);History of falling (Z91.81)    Time: 2956-2130 PT Time Calculation (min) (ACUTE ONLY): 28 min   Charges:   PT Evaluation $PT Eval Low Complexity: 1 Low PT Treatments $Gait Training: 8-22 mins        Minna Merritts, PT, MPT   Percell Locus 07/30/2022, 10:09 AM

## 2022-07-30 NOTE — Progress Notes (Signed)
SLP Cancellation Note  Patient Details Name: Tony Huffman MRN: [id number] DOB: 07/31/1946   Cancelled treatment:       Reason Eval/Treat Not Completed: Patient at procedure or test/unavailable (pt at IR for aspiration/drain placement) SLP attempted follow up cognitive intervention; however, pt is off the floor. Based on chart review PT is recommending SNF short term agreement. Pt will benefit from follow up SLP services at SNF as well. SLP will continue to monitor during acute hospital stay.  [place] Karyn Brull Clapp MS [facility] SLP    [place] J Clapp 07/30/2022, 1:18 PM

## 2022-07-30 NOTE — Progress Notes (Signed)
Hematology/Oncology Progress note [phone] [phone]     Patient Care Team: Tonia Ghent, MD as PCP - General (Family Medicine) [name], MD as Physician Assistant (Endocrinology) [name], MD as Referring Physician (Neurology)   Name of the patient: Tony Huffman  [id number]  [birth year]  Date of visit: 07/30/22   INTERVAL HISTORY-   07/30/22 MRI brain showed . 2.6 cm enhancing mass lesion in the medial left parietal lobe associated with  acute hemorrhage.  Left upper extremity weakness is better. No new focal weakness.    Allergies  Allergen Reactions   Other Rash    Blisters with bandaids Blisters with bandaids    Tegaderm Ag Mesh [Silver]    Tape Rash    Blisters, rash. Paper tape OK. Blisters, rash. Paper tape OK.     Patient Active Problem List   Diagnosis Date Noted   Renal cell carcinoma, left ([place]) 06/19/2022    Priority: High   Pancreatic mass 08/01/2018    Priority: Medium    Goals of care, counseling/discussion 06/19/2022    Priority: Low   Cerebral parenchymal hemorrhage ([place]) 07/29/2022   Left-sided muscle weakness    Other abnormal tumor markers 06/19/2022   Advance care planning 05/27/2022   Gait abnormality 05/27/2022   Diabetes mellitus without complication ([place]) 56/43/3295   Addisons disease ([place]) 03/16/2021   Low HDL (under 40) 03/16/2021   Numbness and tingling 11/21/2020   H/O sebaceous cyst 01/06/2018   Edema leg 03/07/2017   OSA (obstructive sleep apnea) 07/24/2016   RLS (restless legs syndrome) 07/24/2016   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   Atrial fibrillation and flutter ([place]) 10/20/2014   Hyperlipidemia 06/16/2014   Cough syncope 10/15/2013   Dyslipidemia 09/04/2013   Gout 09/04/2013   Chronic kidney disease, stage III (moderate) ([place]) 11/13/2011   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   Hypothyroidism (acquired) 11/30/2010   Malignant neoplasm of adrenal  gland ([place]) 11/10/2010     Past Medical History:  Diagnosis Date   Adrenal insufficiency ([place])    Anxiety    Chronic kidney disease, stage 3 ([place]) 11/13/2011   Erectile dysfunction    GERD (gastroesophageal reflux disease)    Gout 09/04/2013   H/O atrial flutter 03/07/2017   Hyperlipidemia 06/16/2014   Hypertension    Hypothyroidism (acquired) 04/14/2014   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[place]) 11/10/2010   OSA (obstructive sleep apnea) 07/24/2016   Pancreatic mass 08/01/2018   Renal carcinoma ([place]) 06/16/2015   RLS (restless legs syndrome) 07/24/2016   Sleep apnea    Testicular hypofunction    Tussive syncopes 10/15/2013   Type 2 diabetes mellitus ([place])    Vitamin D deficiency disease      Past Surgical History:  Procedure Laterality Date   ADRENALECTOMY     CATARACT EXTRACTION  10/2014   CATARACT EXTRACTION EXTRACAPSULAR  11/05/2016   with Insertion Intraocular Prostheis.    COLONOSCOPY  05/06/2017   with removal lesions by snare.   CYSTECTOMY     Upper Neck/Chest Area, [facility]   NEPHRECTOMY     POPLITEAL SYNOVIAL CYST EXCISION     SPINE SURGERY  1989   THYROIDECTOMY     patient states they only removed half   TONSILLECTOMY      Social History   Socioeconomic History   Marital status: Married    Spouse name: Not on file   Number of children: Not on file   Years of education: Not on  file   Highest education level: Not on file  Occupational History   Not on file  Tobacco Use   Smoking status: Former    Packs/day: 1.00    Years: 10.00    Total pack years: 10.00    Types: Cigarettes    Quit date: 36    Years since quitting: 45.8    Passive exposure: Past   Smokeless tobacco: Never  Vaping Use   Vaping Use: Never used  Substance and Sexual Activity   Alcohol use: Not Currently   Drug use: Never   Sexual activity: Not Currently  Other Topics Concern   Not on file  Social History Narrative   Married 2002.   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   Lives at [facility].   Social Determinants of Health   Financial Resource Strain: Low Risk  (07/30/2022)   Overall Financial Resource Strain (CARDIA)    Difficulty of Paying Living Expenses: Not hard at all  Food Insecurity: No Food Insecurity (07/30/2022)   Hunger Vital Sign    Worried About Running Out of Food in the Last Year: Never true    Ran Out of Food in the Last Year: Never true  Transportation Needs: No Transportation Needs (07/30/2022)   PRAPARE - Hydrologist (Medical): No    Lack of Transportation (Non-Medical): No  Physical Activity: Inactive (07/30/2022)   Exercise Vital Sign    Days of Exercise per Week: 0 days    Minutes of Exercise per Session: 0 min  Stress: Stress Concern Present (07/30/2022)   [place]    Feeling of Stress : To some extent  Social Connections: Socially Integrated (07/30/2022)   Social Connection and Isolation Panel [NHANES]    Frequency of Communication with Friends and Family: Twice a week    Frequency of Social Gatherings with Friends and Family: Twice a week    Attends Religious Services: 1 to 4 times per year    Active Member of [employer] or Organizations: Yes    Attends Archivist Meetings: 1 to 4 times per year    Marital Status: Married  Human resources officer Violence: Not At Risk (07/30/2022)   Humiliation, Afraid, Rape, and Kick questionnaire    Fear of Current or Ex-Partner: No    Emotionally Abused: No    Physically Abused: No    Sexually Abused: No     Family History  Problem Relation Age of Onset   Ovarian cancer Mother    Cancer Mother    Cancer Father    Heart disease Father    Coronary artery disease Father    Hypertension Father    Macular degeneration Father    Heart disease Brother    Diabetes Brother    Coronary artery disease Brother    Diabetes type II Brother    Coronary artery disease  Paternal Grandfather      Current Facility-Administered Medications:    0.9 %  sodium chloride infusion, 250 mL, Intravenous, PRN, Agbata, Tochukwu, MD   acetaminophen (TYLENOL) tablet 650 mg, 650 mg, Oral, Q6H PRN **OR** acetaminophen (TYLENOL) suppository 650 mg, 650 mg, Rectal, Q6H PRN, Agbata, Tochukwu, MD   cholecalciferol (VITAMIN D3) 25 MCG (1000 UNIT) tablet 5,000 Units, 5,000 Units, Oral, Daily, Agbata, Tochukwu, MD, 5,000 Units at 07/30/22 0825   colchicine tablet 0.6 mg, 0.6 mg, Oral, Daily PRN, Agbata, Tochukwu, MD   cyanocobalamin (VITAMIN B12) tablet 1,000 mcg,  1,000 mcg, Oral, QODAY, Agbata, Tochukwu, MD, 1,000 mcg at 07/29/22 1421   cyclobenzaprine (FLEXERIL) tablet 10 mg, 10 mg, Oral, TID PRN, Agbata, Tochukwu, MD   fludrocortisone (FLORINEF) tablet 0.05 mg, 0.05 mg, Oral, QODAY, Agbata, Tochukwu, MD, 0.05 mg at 07/29/22 1533   fludrocortisone (FLORINEF) tablet 0.1 mg, 0.1 mg, Oral, QODAY, Agbata, Tochukwu, MD, 0.1 mg at 07/30/22 0826   gabapentin (NEURONTIN) capsule 900 mg, 900 mg, Oral, QHS, Agbata, Tochukwu, MD, 900 mg at 07/29/22 2214   hydrocortisone (CORTEF) tablet 15 mg, 15 mg, Oral, QAC breakfast, Agbata, Tochukwu, MD, 15 mg at 07/30/22 0825   hydrocortisone (CORTEF) tablet 5 mg, 5 mg, Oral, BID AC, Benita Gutter, RPH, 5 mg at 07/30/22 1744   insulin aspart (novoLOG) injection 0-15 Units, 0-15 Units, Subcutaneous, TID WC, Agbata, Tochukwu, MD, 8 Units at 07/30/22 1744   levothyroxine (SYNTHROID) tablet 75 mcg, 75 mcg, Oral, Q0600, Agbata, Tochukwu, MD, 75 mcg at 07/30/22 0519   mirabegron ER (MYRBETRIQ) tablet 50 mg, 50 mg, Oral, Daily, Agbata, Tochukwu, MD, 50 mg at 07/30/22 0826   multivitamin-lutein (OCUVITE-LUTEIN) capsule 1 capsule, 1 capsule, Oral, Daily, Agbata, Tochukwu, MD, 1 capsule at 07/30/22 0825   ondansetron (ZOFRAN) tablet 4 mg, 4 mg, Oral, Q6H PRN **OR** ondansetron (ZOFRAN) injection 4 mg, 4 mg, Intravenous, Q6H PRN, Agbata, Tochukwu, MD   psyllium  (HYDROCIL/METAMUCIL) 1 packet, 1 packet, Oral, Daily PRN, Agbata, Tochukwu, MD   sodium chloride flush (NS) 0.9 % injection 3 mL, 3 mL, Intravenous, Q12H, Agbata, Tochukwu, MD, 3 mL at 07/30/22 0829   sodium chloride flush (NS) 0.9 % injection 3 mL, 3 mL, Intravenous, PRN, Agbata, Tochukwu, MD, 3 mL at 07/29/22 1532   Physical exam:  Vitals:   07/30/22 0523 07/30/22 0758 07/30/22 1155 07/30/22 1607  BP: 120/70 105/69 107/67 113/70  Pulse: 82 75 73 71  Resp: '18 18 20 18  '$ Temp: (!) 97.4 F (36.3 C) 97.6 F (36.4 C) (!) 97.5 F (36.4 C) (!) 97.5 F (36.4 C)  TempSrc:      SpO2: 96% 96% 96% 97%  Weight:      Height:       Physical Exam    Labs    Latest Ref Rng & Units 07/29/2022    9:05 AM 06/19/2022   10:45 AM 05/07/2022   12:00 AM  CBC  WBC 4.0 - 10.5 K/uL 6.4  8.0  7.4      Hemoglobin 13.0 - 17.0 g/dL 14.1  13.7  14.2      Hematocrit 39.0 - 52.0 % 43.1  41.4  43      Platelets 150 - 400 K/uL 284  264  292         This result is from an external source.      Latest Ref Rng & Units 07/29/2022    9:05 AM 06/19/2022   10:45 AM 06/11/2022    2:04 PM  CMP  Glucose 70 - 99 mg/dL 164  181    BUN 8 - 23 mg/dL 24  24    Creatinine 0.61 - 1.24 mg/dL 1.02  0.94  1.10   Sodium 135 - 145 mmol/L 137  134    Potassium 3.5 - 5.1 mmol/L 4.2  4.9    Chloride 98 - 111 mmol/L 103  103    CO2 22 - 32 mmol/L 25  25    Calcium 8.9 - 10.3 mg/dL 9.3  8.8    Total Protein 6.5 - 8.1 g/dL  7.2  7.0    Total Bilirubin 0.3 - 1.2 mg/dL 0.9  0.6    Alkaline Phos 38 - 126 U/L 86  82    AST 15 - 41 U/L 21  25    ALT 0 - 44 U/L 23  22       RADIOGRAPHIC STUDIES: I have personally reviewed the radiological images as listed and agreed with the findings in the report. MR BRAIN W WO CONTRAST  Result Date: 07/29/2022 CLINICAL DATA:  Intracranial hemorrhage. Known renal cell carcinoma. Pancreatic mass. EXAM: MRI HEAD WITHOUT AND WITH CONTRAST TECHNIQUE: Multiplanar, multiecho pulse sequences of the  brain and surrounding structures were obtained without and with intravenous contrast. CONTRAST:  41m GADAVIST GADOBUTROL 1 MMOL/ML IV SOLN COMPARISON:  CT head without contrast 07/29/2022. MR head without contrast six/eighteen/twenty two FINDINGS: Brain: A 2.6 x 1.5 x 1.5 cm enhancing mass lesion in the left medial left parietal lobe is associated with the area of acute hemorrhage. No other enhancing lesions or focal hemorrhage is evident. No other foci of susceptibility are present. Mild edema surrounds the acute hemorrhage. Advanced atrophy is again noted. The ventricles are proportionate to the degree of atrophy. No significant extraaxial fluid collection is present. Vascular: Flow is present in the major intracranial arteries. Skull and upper cervical spine: The craniocervical junction is normal. Upper cervical spine is within normal limits. Marrow signal is unremarkable. Sinuses/Orbits: The paranasal sinuses and mastoid air cells are clear. Bilateral lens replacements are noted. Globes and orbits are otherwise unremarkable. IMPRESSION: 1. 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 2. No additional metastases present. 3. Stable moderate generalized atrophy. Electronically Signed   By: CSan MorelleM.D.   On: 07/29/2022 15:27   CT HEAD WO CONTRAST  Result Date: 07/29/2022 CLINICAL DATA:  Left-sided weakness. History of pancreatic carcinoma. Patient awoke this morning with left-sided weakness and left arm drift. EXAM: CT HEAD WITHOUT CONTRAST TECHNIQUE: Contiguous axial images were obtained from the base of the skull through the vertex without intravenous contrast. RADIATION DOSE REDUCTION: This exam was performed according to the departmental dose-optimization program which includes automated exposure control, adjustment of the mA and/or kV according to patient size and/or use of iterative  reconstruction technique. COMPARISON:  Head CT, 01/20/2021. Brain MRI, 03/18/2021. PET-CT, 07/05/2022. FINDINGS: Brain: Acute focal parenchymal hemorrhage, superomedial left parietal lobe, measuring 2.7 x 1.4 x 2.1 cm, with mild surrounding vasogenic edema, but no significant mass effect. No other intracranial hemorrhage. No evidence of acute infarction. No defined mass or significant mass effect. No extra-axial masses or abnormal fluid collections. There is ventricular greater than sulcal enlargement consistent with atrophy. No hydrocephalus. Vascular: No hyperdense vessel or unexpected calcification. Skull: Normal. Negative for fracture or focal lesion. Sinuses/Orbits: Globes and orbits are unremarkable. Sinuses are essentially clear. Other: None. IMPRESSION: 1. 2.7 x 1.4 x 2.1 cm focus of acute parenchymal hemorrhage in the superior, medial left parietal lobe. Suspect this to be a hemorrhagic metastatic lesion given the history of metastatic pancreatic carcinoma. Consider follow-up brain MRI without and with contrast for further assessment. Critical Value/emergent results were called by telephone at the time of interpretation on 07/29/2022 at 10:01 am [facility] who verbally acknowledged these results. 2. No other acute abnormality. No other areas of intracranial hemorrhage. No acute infarction. Electronically Signed   By: DLajean ManesM.D.   On: 07/29/2022  10:01   NM PET Image Initial (PI) Skull Base To Thigh  Result Date: 07/06/2022 CLINICAL DATA:  Initial treatment strategy for pancreatic malignancy. EXAM: NUCLEAR MEDICINE PET SKULL BASE TO THIGH TECHNIQUE: 11.81 mCi F-18 FDG was injected intravenously. Full-ring PET imaging was performed from the skull base to thigh after the radiotracer. CT data was obtained and used for attenuation correction and anatomic localization. Fasting blood glucose: 145 mg/dl COMPARISON:  CT abdomen dated June 11, 2022 FINDINGS: Mediastinal blood pool  activity: SUV max 2.0 Liver activity: SUV max 2.7 NECK: No hypermetabolic lymph nodes in the neck. Incidental CT findings: None. CHEST: Enlarged and hypermetabolic left hilar lymph node measuring 2.6 cm in short axis on series 2, image 110 with SUV max of 4.9. bilateral hypermetabolic pulmonary nodules, reference nodule of the right lower lobe measuring 0.8 x 1.4 cm on series 2, image 115 with SUV max of 2.6, not included in the field of view on prior exam. Reference nodule of the left lower lobe measuring 2.0 x 1.6 cm on series 2, image 122 with SUV max of 3.6, unchanged in size when compared with prior. Incidental CT findings: Moderate atherosclerotic disease of the LAD. Normal caliber thoracic aorta with mild calcified plaque. ABDOMEN/PELVIS: Hypermetabolic uptake centered at the wall of the descending duodenum and pancreatic head with soft tissue lesion measuring approximately 2.9 x 1.6 cm on PET-CT, better seen on prior contrast-enhanced abdominal CT, SUV max of 3.6. Hypermetabolic soft tissue nodule at the left nephrectomy site measuring 2.6 x 2.7 cm on series 2, image 52 an SUV max of 3.3, unchanged in size when compared with prior CT of the abdomen. Incidental CT findings: Status post left nephrectomy and bilateral adrenalectomies. Normal caliber abdominal aorta with mild atherosclerotic disease. SKELETON: No focal hypermetabolic activity to suggest skeletal metastasis. Incidental CT findings: None. IMPRESSION: 1. Hypermetabolic soft tissue lesion centered at the wall of the descending duodenum and pancreatic head, differential considerations include primary malignancy such as duodenal/ampullary or pancreatic, metastatic renal cell carcinoma is also a consideration. 2. Hypermetabolic soft tissue nodule at the left nephrectomy site, concerning for recurrent RCC. 3. Numerous bilateral hypermetabolic pulmonary nodules, consistent with metastatic disease. 4. Enlarged hypermetabolic left hilar lymph node,  consistent with metastatic disease. 5. Status post left nephrectomy and bilateral adrenalectomies. 6. Aortic Atherosclerosis (ICD10-I70.0). Electronically Signed   By: Yetta Glassman M.D.   On: 07/06/2022 16:08    Assessment and plan-   #Acute intracranial bleeding, possible secondary to metastatic lesion. An MRI brain for further evaluation.  Neurosurgery has been consulted and appreciate recommendation. Discussed case with Radonc Dr.Chrystal. he will be simulated to start palliative RT.    #long Standing history of metastatic RCC, not currently on any treatments due to patient's preference. #Duodenal/pancreatic lesion, CA 19-9 was normal.  Possible metastatic lesion versus primary lesion.  Patient declines biopsy Recommend patient to continue follow up with [facility] outpatient  # Adrenal insufficiency.  On Florinef and hydrocortisone. Recommend stress dosing.   Thank you for allowing me to participate in the care of this patient.   Earlie Server, MD, PhD Hematology Oncology 07/30/2022

## 2022-07-30 NOTE — TOC Initial Note (Addendum)
Transition of Care [facility]) - Initial/Assessment Note    Patient Details  Name: Tony Huffman MRN: [id number] Date of Birth: October 08, 1945  Transition of [facility]) CM/SW Contact:    Candie Chroman, LCSW Phone Number: 07/30/2022, 12:07 PM  Clinical Narrative:    CSW met with patient. Wife at bedside. CSW introduced role and explained that PT recommendations would be discussed. Patient and his wife live on the ILF side at [facility]. They are agreeable to patient going to the SNF side at discharge. Admissions coordinator is aware. Patient does not meet medical necessity for EMS transport at this time. Will see if [facility] can transport in their [place] at discharge. Wife is going out of town Friday until late Sunday afternoon. No further concerns. CSW encouraged patient and his wife to contact CSW as needed. CSW will continue to follow patient and his wife for support and facilitate discharge to SNF once medically stable.              1:35 pm: Patient will be getting 5 sessions of radiation. Per oncology, no need to stay inpatient just for that. SNF is aware and will need information on appointment time and durations. Sent secure chat to oncology to find out.  Expected Discharge Plan: Skilled Nursing Facility Barriers to Discharge: Continued Medical Work up   Patient Goals and CMS Choice     Choice offered to / list presented to : Patient, Spouse  Expected Discharge Plan and Services Expected Discharge Plan: [name] Acute Care Choice: [place] Living arrangements for the past 2 months: [place]                                      Prior Living Arrangements/Services Living arrangements for the past 2 months: [place] Lives with:: Spouse Patient language and need for interpreter reviewed:: Yes Do you feel safe going back to the place where you live?: Yes      Need for Family Participation in Patient  Care: Yes (Comment) Care giver support system in place?: Yes (comment)   Criminal Activity/Legal Involvement Pertinent to Current Situation/Hospitalization: No - Comment as needed  Activities of Daily Living Home Assistive Devices/Equipment: Walker (specify type) ADL Screening (condition at time of admission) Patient's cognitive ability adequate to safely complete daily activities?: Yes Is the patient deaf or have difficulty hearing?: Yes Does the patient have difficulty seeing, even when wearing glasses/contacts?: No Does the patient have difficulty concentrating, remembering, or making decisions?: No Patient able to express need for assistance with ADLs?: Yes Does the patient have difficulty dressing or bathing?: Yes Independently performs ADLs?: No Communication: Independent Dressing (OT): Needs assistance Grooming: Needs assistance Feeding: Independent Bathing: Needs assistance Toileting: Needs assistance In/Out Bed: Needs assistance Walks in Home: Independent with device (comment) Does the patient have difficulty walking or climbing stairs?: Yes Weakness of Legs: Left Weakness of Arms/Hands: Left  Permission Sought/Granted Permission sought to share information with : Facility Sport and exercise psychologist, Family Supports Permission granted to share information with : Yes, Verbal Permission Granted  Share Information with NAME: Carnelius Hammitt  Permission granted to share info w AGENCY: [facility]  Permission granted to share info w Relationship: Wife  Permission granted to share info w Contact Information: [phone]  Emotional Assessment Appearance:: Appears stated age Attitude/Demeanor/Rapport: Engaged, Gracious Affect (typically observed): Accepting, Appropriate, Calm,  Pleasant Orientation: : Oriented to Self, Oriented to Place, Oriented to  Time, Oriented to Situation Alcohol / Substance Use: Not Applicable Psych Involvement: No (comment)  Admission diagnosis:   Hypercalcemia [E83.52] Cerebral parenchymal hemorrhage (HCC) [I61.9] Left-sided muscle weakness [M62.81] Intraparenchymal hemorrhage of brain [facility]) [I61.9] Patient Active Problem List   Diagnosis Date Noted   Cerebral parenchymal hemorrhage ([place]) 07/29/2022   Left-sided muscle weakness    Renal cell carcinoma, left ([place]) 06/19/2022   Other abnormal tumor markers 06/19/2022   Goals of care, counseling/discussion 06/19/2022   Advance care planning 05/27/2022   Gait abnormality 05/27/2022   Diabetes mellitus without complication ([place]) 77/93/9030   Addisons disease ([place]) 03/16/2021   Low HDL (under 40) 03/16/2021   Numbness and tingling 11/21/2020   Pancreatic mass 08/01/2018   H/O sebaceous cyst 01/06/2018   Edema leg 03/07/2017   OSA (obstructive sleep apnea) 07/24/2016   RLS (restless legs syndrome) 07/24/2016   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   Atrial fibrillation and flutter ([place]) 10/20/2014   Hyperlipidemia 06/16/2014   Cough syncope 10/15/2013   Dyslipidemia 09/04/2013   Gout 09/04/2013   Chronic kidney disease, stage III (moderate) ([place]) 11/13/2011   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

## 2022-07-30 NOTE — Inpatient Diabetes Management (Signed)
Inpatient Diabetes Program Recommendations  AACE/ADA: New Consensus Statement on Inpatient Glycemic Control (2015)  Target Ranges:  Prepandial:   less than 140 mg/dL      Peak postprandial:   less than 180 mg/dL (1-2 hours)      Critically ill patients:  140 - 180 mg/dL   Lab Results  Component Value Date   GLUCAP 218 (H) 07/30/2022   HGBA1C 7.2 (H) 07/29/2022    Review of Glycemic Control  Latest Reference Range & Units 07/29/22 14:31 07/29/22 16:18 07/29/22 20:05 07/29/22 22:17 07/30/22 07:55  Glucose-Capillary 70 - 99 mg/dL 208 (H) 250 (H) 299 (H) 290 (H) 218 (H)  (H): Data is abnormally high  Diabetes history: DM2 Outpatient Diabetes medications: metformin 500 mg QD Current orders for Inpatient glycemic control: Novolog 0-15 units TID & Cortef 15 mg QAM  Inpatient Diabetes Program Recommendations:    Novolog 0-20 units TID and HS while receiving steroids.   Will continue to follow while inpatient.  Thank you, Reche Dixon, MSN, [place] Diabetes Coordinator Inpatient Diabetes Program [phone] (team pager from 8a-5p)

## 2022-07-30 NOTE — NC FL2 (Signed)
[place] LEVEL OF CARE SCREENING TOOL     IDENTIFICATION  Patient Name: Tony Huffman Birthdate: September 01, 1946 Sex: male Admission Date (Current Location): 07/29/2022  [facility] and [place] Number:  Engineering geologist and Address:  [facility], [address] [place], [place] [address]      Provider Number: [id number]  Attending Physician Name and Address:  Shawna Clamp, MD  Relative Name and Phone Number:       Current Level of Care: Hospital Recommended Level of Care: [place] Prior Approval Number:    Date Approved/Denied:   PASRR Number: [id number]  Discharge Plan: SNF    Current Diagnoses: Patient Active Problem List   Diagnosis Date Noted   Cerebral parenchymal hemorrhage ([name]) 07/29/2022   Left-sided muscle weakness    Renal cell carcinoma, left ([place]) 06/19/2022   Other abnormal tumor markers 06/19/2022   Goals of care, counseling/discussion 06/19/2022   Advance care planning 05/27/2022   Gait abnormality 05/27/2022   Diabetes mellitus without complication ([place]) 01/65/5374   Addisons disease ([place]) 03/16/2021   Low HDL (under 40) 03/16/2021   Numbness and tingling 11/21/2020   Pancreatic mass 08/01/2018   H/O sebaceous cyst 01/06/2018   Edema leg 03/07/2017   OSA (obstructive sleep apnea) 07/24/2016   RLS (restless legs syndrome) 07/24/2016   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[place]) 10/04/2015   Atrial fibrillation and flutter ([place]) 10/20/2014   Hyperlipidemia 06/16/2014   Cough syncope 10/15/2013   Dyslipidemia 09/04/2013   Gout 09/04/2013   Chronic kidney disease, stage III (moderate) (HCC) 11/13/2011   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   Hypothyroidism (acquired) 11/30/2010   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[place]) 11/10/2010    Orientation RESPIRATION BLADDER Height & Weight     Self, Time, Situation, Place  Normal Incontinent Weight: 200 lb (90.7 kg) Height:  '5\' 8"'$  (172.7 cm)   BEHAVIORAL SYMPTOMS/MOOD NEUROLOGICAL BOWEL NUTRITION STATUS   (None)   Continent Diet (Carb modified)  AMBULATORY STATUS COMMUNICATION OF NEEDS Skin   Limited Assist Verbally Normal                       Personal Care Assistance Level of Assistance  Bathing, Feeding, Dressing Bathing Assistance: Limited assistance Feeding assistance: Limited assistance Dressing Assistance: Limited assistance     Functional Limitations Info  Sight, Hearing, Speech Sight Info: Adequate Hearing Info: Adequate Speech Info: Adequate    SPECIAL CARE FACTORS FREQUENCY  PT (By licensed PT), OT (By licensed OT)     PT Frequency: 5 x week OT Frequency: 5 x week            Contractures Contractures Info: Not present    Additional Factors Info  Code Status, Allergies Code Status Info: DNR Allergies Info: Bandaids, Tegaderm Ag Mesh (Silver), Tape           Current Medications (07/30/2022):  This is the current hospital active medication list Current Facility-Administered Medications  Medication Dose Route Frequency Provider Last Rate Last Admin    stroke: early stages of recovery book   Does not apply Once Agbata, Tochukwu, MD       0.9 %  sodium chloride infusion  250 mL Intravenous PRN Agbata, Tochukwu, MD       acetaminophen (TYLENOL) tablet 650 mg  650 mg Oral Q6H PRN Agbata, Tochukwu, MD       Or   acetaminophen (TYLENOL) suppository 650 mg  650 mg Rectal Q6H PRN Agbata, Tochukwu,  MD       cholecalciferol (VITAMIN D3) 25 MCG (1000 UNIT) tablet 5,000 Units  5,000 Units Oral Daily Agbata, Tochukwu, MD   5,000 Units at 07/30/22 0825   colchicine tablet 0.6 mg  0.6 mg Oral Daily PRN Agbata, Tochukwu, MD       cyanocobalamin (VITAMIN B12) tablet 1,000 mcg  1,000 mcg Oral QODAY Agbata, Tochukwu, MD   1,000 mcg at 07/29/22 1421   cyclobenzaprine (FLEXERIL) tablet 10 mg  10 mg Oral TID PRN Agbata, Tochukwu, MD       fludrocortisone (FLORINEF) tablet 0.05 mg  0.05 mg Oral QODAY Agbata,  Tochukwu, MD   0.05 mg at 07/29/22 1533   fludrocortisone (FLORINEF) tablet 0.1 mg  0.1 mg Oral QODAY Agbata, Tochukwu, MD   0.1 mg at 07/30/22 0826   gabapentin (NEURONTIN) capsule 900 mg  900 mg Oral QHS Agbata, Tochukwu, MD   900 mg at 07/29/22 2214   hydrocortisone (CORTEF) tablet 15 mg  15 mg Oral QAC breakfast Agbata, Tochukwu, MD   15 mg at 07/30/22 0825   hydrocortisone (CORTEF) tablet 5 mg  5 mg Oral BID AC Lockie Mola B, RPH   5 mg at 07/29/22 1746   insulin aspart (novoLOG) injection 0-15 Units  0-15 Units Subcutaneous TID WC Agbata, Tochukwu, MD   5 Units at 07/30/22 0824   levothyroxine (SYNTHROID) tablet 75 mcg  75 mcg Oral Q0600 Agbata, Tochukwu, MD   75 mcg at 07/30/22 0519   mirabegron ER (MYRBETRIQ) tablet 50 mg  50 mg Oral Daily Agbata, Tochukwu, MD   50 mg at 07/30/22 8676   multivitamin-lutein (OCUVITE-LUTEIN) capsule 1 capsule  1 capsule Oral Daily Agbata, Tochukwu, MD   1 capsule at 07/30/22 0825   ondansetron (ZOFRAN) tablet 4 mg  4 mg Oral Q6H PRN Agbata, Tochukwu, MD       Or   ondansetron (ZOFRAN) injection 4 mg  4 mg Intravenous Q6H PRN Agbata, Tochukwu, MD       psyllium (HYDROCIL/METAMUCIL) 1 packet  1 packet Oral Daily PRN Agbata, Tochukwu, MD       sodium chloride flush (NS) 0.9 % injection 3 mL  3 mL Intravenous Q12H Agbata, Tochukwu, MD   3 mL at 07/30/22 0829   sodium chloride flush (NS) 0.9 % injection 3 mL  3 mL Intravenous PRN Agbata, Tochukwu, MD   3 mL at 07/29/22 1532     Discharge Medications: Please see discharge summary for a list of discharge medications.  Relevant Imaging Results:  Relevant Lab Results:   Additional Information SS#: [id number]  Candie Chroman, LCSW

## 2022-07-30 NOTE — Progress Notes (Signed)
[facility]  Initial Assessment   Roc Streett is a 76 y.o. year old male contacted by phone. Clinical Social Work was referred by medical provider for assessment of psychosocial needs.   SDOH (Social Determinants of Health) assessments performed: Yes SDOH Interventions    Flowsheet Row Clinical Support from 07/30/2022 in [place] at [place] Interventions   Food Insecurity Interventions Intervention Not Indicated  Housing Interventions Intervention Not Indicated  Transportation Interventions Intervention Not Indicated  Utilities Interventions Intervention Not Indicated  Alcohol Usage Interventions Intervention Not Indicated (Score <7)  Financial Strain Interventions Intervention Not Indicated  Physical Activity Interventions Intervention Not Indicated  Stress Interventions Intervention Not Indicated  Social Connections Interventions Intervention Not Indicated       SDOH Screenings   Food Insecurity: No Food Insecurity (07/30/2022)  Housing: Low Risk  (07/30/2022)  Transportation Needs: No Transportation Needs (07/30/2022)  Utilities: Not At Risk (07/30/2022)  Alcohol Screen: Low Risk  (07/30/2022)  Depression (PHQ2-9): Low Risk  (07/30/2022)  Financial Resource Strain: Low Risk  (07/30/2022)  Physical Activity: Inactive (07/30/2022)  Social Connections: Socially Integrated (07/30/2022)  Stress: Stress Concern Present (07/30/2022)  Tobacco Use: Medium Risk (07/29/2022)     Distress Screen completed: No     No data to display            Family/Social Information:  Housing Arrangement: patient lives with spouse Kie, [place]   , live at [facility] independent living Family members/support persons in your life? Family, Friends, Medical laboratory scientific officer, Social research officer, government, and [employer] concerns: no  Employment: Retired  .  Income source: Paediatric nurse concerns: No Type of concern: None Food  access concerns: no Religious or spiritual practice: Financial planner Currently in place:  Lives at [facility], Long-term care insurance  Coping/ Adjustment to diagnosis: Patient understands treatment plan and what happens next? yes Concerns about diagnosis and/or treatment: How will I care for myself and Quality of life Patient reported stressors: Relating to God, Facing my mortality, Physical issues, and Long-term care Hopes and/or priorities: N/A Patient enjoys  N/A Current coping skills/ strengths: Average or above average intelligence , Capable of independent living , Communication skills , Financial means , General fund of knowledge , Physical Health , Religious Affiliation , and Supportive family/friends     SUMMARY: Current SDOH Barriers:  No social work barriers  Clinical Social Work Clinical Goal(s):  No clinical social work goals at this time  Interventions: Discussed common feeling and emotions when being diagnosed with cancer, and the importance of support during treatment Informed patient of the support team roles and support services at [facility] Provided [place] contact information and encouraged patient to call with any questions or concerns Provided patient with information about CSW role in patient care and available resources.   Follow Up Plan: Patient will contact CSW with any support or resource needs and CSW will follow-up with patient by phone  Patient verbalizes understanding of plan: Yes    Adelene Amas, LCSW

## 2022-07-30 NOTE — Plan of Care (Signed)

## 2022-07-30 NOTE — Progress Notes (Signed)
OT Cancellation Note  Patient Details Name: Tony Huffman MRN: [id number] DOB: [birth year]   Cancelled Treatment:    Reason Eval/Treat Not Completed: Patient at procedure or test/ unavailable. OT order received and chart reviewed. Pt off the unit at radiation. OT to re-attempt when next available.   Darleen Crocker, MS, OTR/L , CBIS ascom [phone]  07/30/22, 1:28 PM

## 2022-07-30 NOTE — Progress Notes (Signed)
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 Patient now has recurrence of his disease with mets to the lungs, left nephrectomy site and pancreas. Patient declined repeat biopsy due to previous unsuccessful biopsies at outside facilities.  He has been on hospice but is currently on palliative care. His other PMH includes, hypothyroidism, hypertension, obstructive sleep apnea, adrenal insufficiency and diabetes mellitus. He presented to the ED for left upper extremity weakness which he noted when he woke up in the morning.  He fell on the morning of his admission while attempting to ambulate because he was unable to hold onto his rolling walker.  His symptoms improved after he arrived in the ED. CT head showed acute intraparenchymal hemorrhage in the superior medial left parietal lobe.  Suspect this to be hemorrhagic metastatic lesion given history of metastatic pancreatic carcinoma. Neurosurgery was consulted recommended MRI.  Oncology and radiation oncology consulted.  Patient is started on radiation therapy.  Assessment & Plan:   Principal Problem:   Cerebral parenchymal hemorrhage (HCC) Active Problems:   Renal cell carcinoma, left (HCC)   Addisons disease ([place])   Hypothyroidism (acquired)   Diabetes mellitus without complication ([place])  Intraparenchymal hemorrhage: Patient presented in the ED for the evaluation of left upper extremity weakness which was noted upon waking up in the morning. CT scan of the head without contrast shows 2.7 x 1.4 x 2.1 cm focus of acute parenchymal hemorrhage in the superior, medial left parietal lobe.  Suspect this to be a hemorrhagic metastatic lesion given the history of metastatic  pancreatic carcinoma. MRI brain : 2.6 cm enhancing mass lesion in the medial left parietal lobe associated with the acute hemorrhage is compatible with a renal cell carcinoma metastasis. Neurosurgery consulted, patient is not interested in any neurosurgical intervention. Oncology and radiation oncology consulted.  Patient started on radiation therapy.   Renal cell carcinoma, left: Patient has a known history of metastatic left renal cell carcinoma and is status post left nephrectomy and bilateral adrenalectomy with new metastatic disease to the pancreas, lungs and nephrectomy bed. Oncology consulted, Patient is started on radiation.   Type 2 diabetes: Hold metformin Carb modified diet, Regular insulin sliding scale.   Hypothyroidism: Continue Synthroid.   Addison's disease Continue Florinef and hydrocortisone.   DVT prophylaxis:  SCDs Code Status: DNR Family Communication: Wife at bed side. Disposition Plan:    Status is: Inpatient Remains inpatient appropriate because: Admitted for left upper extremity weakness found to have metastatic hemorrhagic lesion.  Patient started on radiation therapy    Consultants:  Neuro surgery Oncology Radiation oncology  Procedures: CT head, MRI brain  Antimicrobials:None   Subjective: Patient was seen and examined at bedside.  Overnight events noted.   Patient reports left upper extremity weakness is improving. He denies any headache or dizziness or numbness .  Patient is going to get radiation treatment.  Objective: Vitals:   07/29/22 2328 07/30/22 0523 07/30/22 0758 07/30/22 1155  BP: 115/75 120/70 105/69 107/67  Pulse: 84 82 75 73  Resp: '18 18 18 20  '$ Temp: 97.7 F (36.5 C) (!) 97.4 F (36.3 C) 97.6 F (36.4 C) (!) 97.5 F (36.4 C)  TempSrc: Oral     SpO2: 96% 96% 96% 96%  Weight:      Height:        Intake/Output Summary (Last 24 hours) at 07/30/2022 1408 Last data filed at 07/30/2022 1100 Gross per 24 hour  Intake  480 ml  Output --  Net 480 ml   Filed Weights   07/29/22 0912  Weight: 90.7 kg    Examination:  General exam: Appears comfortable, not in any acute distress.  Very deconditioned. Respiratory system: CTA bilaterally, respiratory effort normal, RR 15. Cardiovascular system: S1 & S2 heard, regular rate and rhythm, no murmur. Gastrointestinal system: Abdomen is soft, non tender, non distended, BS+ Central nervous system: Alert and oriented x 3. No focal neurological deficits. Extremities: No edema, no cyanosis, no clubbing. Skin: No rashes, lesions or ulcers Psychiatry: Judgement and insight appear normal. Mood & affect appropriate.     Data Reviewed: I have personally reviewed following labs and imaging studies  CBC: Recent Labs  Lab 07/29/22 0905  WBC 6.4  NEUTROABS 3.6  HGB 14.1  HCT 43.1  MCV 82.1  PLT 852   Basic Metabolic Panel: Recent Labs  Lab 07/29/22 0905  NA 137  K 4.2  CL 103  CO2 25  GLUCOSE 164*  BUN 24*  CREATININE 1.02  CALCIUM 9.3   GFR: Estimated Creatinine Clearance: 67.4 mL/min (by C-G formula based on SCr of 1.02 mg/dL). Liver Function Tests: Recent Labs  Lab 07/29/22 0905  AST 21  ALT 23  ALKPHOS 86  BILITOT 0.9  PROT 7.2  ALBUMIN 3.9   No results for input(s): "LIPASE", "AMYLASE" in the last 168 hours. No results for input(s): "AMMONIA" in the last 168 hours. Coagulation Profile: Recent Labs  Lab 07/29/22 0905  INR 1.1   Cardiac Enzymes: No results for input(s): "CKTOTAL", "CKMB", "CKMBINDEX", "TROPONINI" in the last 168 hours. BNP (last 3 results) No results for input(s): "PROBNP" in the last 8760 hours. HbA1C: Recent Labs    07/29/22 0905  HGBA1C 7.2*   CBG: Recent Labs  Lab 07/29/22 1618 07/29/22 2005 07/29/22 2217 07/30/22 0755 07/30/22 1152  GLUCAP 250* 299* 290* 218* 292*   Lipid Profile: Recent Labs    07/30/22 0421  CHOL 173  HDL 31*  LDLCALC 90  TRIG 260*  CHOLHDL 5.6   Thyroid Function  Tests: No results for input(s): "TSH", "T4TOTAL", "FREET4", "T3FREE", "THYROIDAB" in the last 72 hours. Anemia Panel: No results for input(s): "VITAMINB12", "FOLATE", "FERRITIN", "TIBC", "IRON", "RETICCTPCT" in the last 72 hours. Sepsis Labs: No results for input(s): "PROCALCITON", "LATICACIDVEN" in the last 168 hours.  No results found for this or any previous visit (from the past 240 hour(s)).   Radiology Studies: MR BRAIN W WO CONTRAST  Result Date: 07/29/2022 CLINICAL DATA:  Intracranial hemorrhage. Known renal cell carcinoma. Pancreatic mass. EXAM: MRI HEAD WITHOUT AND WITH CONTRAST TECHNIQUE: Multiplanar, multiecho pulse sequences of the brain and surrounding structures were obtained without and with intravenous contrast. CONTRAST:  21m GADAVIST GADOBUTROL 1 MMOL/ML IV SOLN COMPARISON:  CT head without contrast 07/29/2022. MR head without contrast six/eighteen/twenty two FINDINGS: Brain: A 2.6 x 1.5 x 1.5 cm enhancing mass lesion in the left medial left parietal lobe is associated with the area of acute hemorrhage. No other enhancing lesions or focal hemorrhage is evident. No other foci of susceptibility are present. Mild edema surrounds the acute hemorrhage. Advanced atrophy is again noted. The ventricles are proportionate to the degree of atrophy. No significant extraaxial fluid collection is present. Vascular: Flow is present in the major intracranial arteries.  Skull and upper cervical spine: The craniocervical junction is normal. Upper cervical spine is within normal limits. Marrow signal is unremarkable. Sinuses/Orbits: The paranasal sinuses and mastoid air cells are clear. Bilateral lens replacements are noted. Globes and orbits are otherwise unremarkable. IMPRESSION: 1. 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 2. No additional metastases present. 3. Stable moderate generalized  atrophy. Electronically Signed   By: San Morelle M.D.   On: 07/29/2022 15:27   CT HEAD WO CONTRAST  Result Date: 07/29/2022 CLINICAL DATA:  Left-sided weakness. History of pancreatic carcinoma. Patient awoke this morning with left-sided weakness and left arm drift. EXAM: CT HEAD WITHOUT CONTRAST TECHNIQUE: Contiguous axial images were obtained from the base of the skull through the vertex without intravenous contrast. RADIATION DOSE REDUCTION: This exam was performed according to the departmental dose-optimization program which includes automated exposure control, adjustment of the mA and/or kV according to patient size and/or use of iterative reconstruction technique. COMPARISON:  Head CT, 01/20/2021. Brain MRI, 03/18/2021. PET-CT, 07/05/2022. FINDINGS: Brain: Acute focal parenchymal hemorrhage, superomedial left parietal lobe, measuring 2.7 x 1.4 x 2.1 cm, with mild surrounding vasogenic edema, but no significant mass effect. No other intracranial hemorrhage. No evidence of acute infarction. No defined mass or significant mass effect. No extra-axial masses or abnormal fluid collections. There is ventricular greater than sulcal enlargement consistent with atrophy. No hydrocephalus. Vascular: No hyperdense vessel or unexpected calcification. Skull: Normal. Negative for fracture or focal lesion. Sinuses/Orbits: Globes and orbits are unremarkable. Sinuses are essentially clear. Other: None. IMPRESSION: 1. 2.7 x 1.4 x 2.1 cm focus of acute parenchymal hemorrhage in the superior, medial left parietal lobe. Suspect this to be a hemorrhagic metastatic lesion given the history of metastatic pancreatic carcinoma. Consider follow-up brain MRI without and with contrast for further assessment. Critical Value/emergent results were called by telephone at the time of interpretation on 07/29/2022 at 10:01 am to provider [facility] , who verbally acknowledged these results. 2. No other acute abnormality. No other  areas of intracranial hemorrhage. No acute infarction. Electronically Signed   By: Lajean Manes M.D.   On: 07/29/2022 10:01    Scheduled Meds:  cholecalciferol  5,000 Units Oral Daily   cyanocobalamin  1,000 mcg Oral QODAY   fludrocortisone  0.05 mg Oral QODAY   fludrocortisone  0.1 mg Oral QODAY   gabapentin  900 mg Oral QHS   hydrocortisone  15 mg Oral QAC breakfast   hydrocortisone  5 mg Oral BID AC   insulin aspart  0-15 Units Subcutaneous TID WC   levothyroxine  75 mcg Oral Q0600   mirabegron ER  50 mg Oral Daily   multivitamin-lutein  1 capsule Oral Daily   sodium chloride flush  3 mL Intravenous Q12H   Continuous Infusions:  sodium chloride       LOS: 1 day    Time spent: 50 mins    Hisham Provence, MD [facility]   If 7PM-7AM, please contact night-coverage

## 2022-07-30 NOTE — Progress Notes (Signed)
[facility] Clinical Social Work  Clinical Social Work was referred by medical provider for assessment of psychosocial needs.  Clinical Social Worker attempted to contact patient by phone  to offer support and assess for needs.  CSW left voicemail with contact information and request for return call.    SA   Raywood Wailes, LCSW  Clinical Social Worker [facility]         

## 2022-07-30 NOTE — Consult Note (Signed)
NEW PATIENT EVALUATION  Name: Tony Huffman  MRN: [id number]  Date:   07/29/2022     DOB: [birth year]   This 76 y.o. 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  REFERRING PHYSICIAN: No ref. provider found  CHIEF COMPLAINT:  Chief Complaint  Patient presents with   Weakness    DIAGNOSIS: The primary encounter diagnosis was Intraparenchymal hemorrhage of brain ([place]). Diagnoses of Left-sided muscle weakness and Hypercalcemia were also pertinent to this visit.   PREVIOUS INVESTIGATIONS:  MRI scan of the brain reviewed Clinical notes reviewed Pathology reports reviewed  HPI: Patient is a 76 year old male with a history of renal cell carcinoma dating back to 2011.  He recently presented with left upper extremity weakness.  CT scan of his head showed a 2.7 x 1.4 cm acute parenchymal hemorrhage hemorrhagic mass in the medial left parietal lobe suspecting hemorrhagic metastasis.  Patient has a history of adrenalectomy bilaterally chronic pulmonary nodules felt to be secondary to metastatic disease.  He also has a known pancreatic lesion noticed on CT scan since 11/02/2017 measuring 4.7 x 3.3 cm on most recent exam.  There is also a left adrenal mass likely metastatic renal cell carcinoma.  PET CT scan in October 2023 showed hypermetabolic soft tissue lesion at the wall of the descending duodenum and pancreatic head also left necrotic sites and bilateral hypermetabolic pulmonary nodules.  His left upper weakness has improved since admission.  PLANNED TREATMENT REGIMEN: Hypofractionated course of radiation therapy to solitary brain metastasis  PAST MEDICAL HISTORY:  has a past medical history of Adrenal insufficiency ([place]), Anxiety, Chronic kidney disease, stage 3 ([place]) (11/13/2011), Erectile dysfunction, GERD (gastroesophageal reflux disease), Gout (09/04/2013), H/O atrial flutter (03/07/2017), Hyperlipidemia  (06/16/2014), Hypertension, Hypothyroidism (acquired) (04/14/2014), 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[place]) (11/10/2010), OSA (obstructive sleep apnea) (07/24/2016), Pancreatic mass (08/01/2018), Renal carcinoma ([place]) (06/16/2015), RLS (restless legs syndrome) (07/24/2016), Sleep apnea, Testicular hypofunction, Tussive syncopes (10/15/2013), Type 2 diabetes mellitus ([place]), and Vitamin D deficiency disease.    PAST SURGICAL HISTORY:  Past Surgical History:  Procedure Laterality Date   ADRENALECTOMY     CATARACT EXTRACTION  10/2014   CATARACT EXTRACTION EXTRACAPSULAR  11/05/2016   with Insertion Intraocular Prostheis.    COLONOSCOPY  05/06/2017   with removal lesions by snare.   CYSTECTOMY     Upper Neck/Chest Area, [facility]   NEPHRECTOMY     POPLITEAL SYNOVIAL CYST EXCISION     SPINE SURGERY  1989   THYROIDECTOMY     patient states they only removed half   TONSILLECTOMY      FAMILY HISTORY: family history includes Cancer in his father and mother; Coronary artery disease in his brother, father, and paternal grandfather; Diabetes in his brother; Diabetes type II in his brother; Heart disease in his brother and father; Hypertension in his father; Macular degeneration in his father; Ovarian cancer in his mother.  SOCIAL HISTORY:  reports that he quit smoking about 45 years ago. His smoking use included cigarettes. He has a 10.00 pack-year smoking history. He has been exposed to tobacco smoke. He has never used smokeless tobacco. He reports that he does not currently use alcohol. He reports that he does not use drugs.  ALLERGIES: Other, Tegaderm ag mesh [silver], and Tape  MEDICATIONS:  Current Facility-Administered Medications  Medication Dose Route Frequency Provider Last Rate Last Admin   0.9 %  sodium chloride infusion  250 mL Intravenous PRN Agbata,  Tochukwu, MD       acetaminophen (TYLENOL) tablet 650 mg  650 mg Oral Q6H PRN Agbata, Tochukwu, MD       Or   acetaminophen  (TYLENOL) suppository 650 mg  650 mg Rectal Q6H PRN Agbata, Tochukwu, MD       cholecalciferol (VITAMIN D3) 25 MCG (1000 UNIT) tablet 5,000 Units  5,000 Units Oral Daily Agbata, Tochukwu, MD   5,000 Units at 07/30/22 0825   colchicine tablet 0.6 mg  0.6 mg Oral Daily PRN Agbata, Tochukwu, MD       cyanocobalamin (VITAMIN B12) tablet 1,000 mcg  1,000 mcg Oral QODAY Agbata, Tochukwu, MD   1,000 mcg at 07/29/22 1421   cyclobenzaprine (FLEXERIL) tablet 10 mg  10 mg Oral TID PRN Agbata, Tochukwu, MD       fludrocortisone (FLORINEF) tablet 0.05 mg  0.05 mg Oral QODAY Agbata, Tochukwu, MD   0.05 mg at 07/29/22 1533   fludrocortisone (FLORINEF) tablet 0.1 mg  0.1 mg Oral QODAY Agbata, Tochukwu, MD   0.1 mg at 07/30/22 0826   gabapentin (NEURONTIN) capsule 900 mg  900 mg Oral QHS Agbata, Tochukwu, MD   900 mg at 07/29/22 2214   hydrocortisone (CORTEF) tablet 15 mg  15 mg Oral QAC breakfast Agbata, Tochukwu, MD   15 mg at 07/30/22 0825   hydrocortisone (CORTEF) tablet 5 mg  5 mg Oral BID AC Lockie Mola B, RPH   5 mg at 07/30/22 1219   insulin aspart (novoLOG) injection 0-15 Units  0-15 Units Subcutaneous TID WC Agbata, Tochukwu, MD   8 Units at 07/30/22 1218   levothyroxine (SYNTHROID) tablet 75 mcg  75 mcg Oral Q0600 Agbata, Tochukwu, MD   75 mcg at 07/30/22 0519   mirabegron ER (MYRBETRIQ) tablet 50 mg  50 mg Oral Daily Agbata, Tochukwu, MD   50 mg at 07/30/22 8182   multivitamin-lutein (OCUVITE-LUTEIN) capsule 1 capsule  1 capsule Oral Daily Agbata, Tochukwu, MD   1 capsule at 07/30/22 0825   ondansetron (ZOFRAN) tablet 4 mg  4 mg Oral Q6H PRN Agbata, Tochukwu, MD       Or   ondansetron (ZOFRAN) injection 4 mg  4 mg Intravenous Q6H PRN Agbata, Tochukwu, MD       psyllium (HYDROCIL/METAMUCIL) 1 packet  1 packet Oral Daily PRN Agbata, Tochukwu, MD       sodium chloride flush (NS) 0.9 % injection 3 mL  3 mL Intravenous Q12H Agbata, Tochukwu, MD   3 mL at 07/30/22 0829   sodium chloride flush (NS) 0.9 %  injection 3 mL  3 mL Intravenous PRN Agbata, Tochukwu, MD   3 mL at 07/29/22 1532    ECOG PERFORMANCE STATUS:  2 - Symptomatic, <50% confined to bed  REVIEW OF SYSTEMS: History of stage IV renal cell carcinoma as described above Patient denies any weight loss, fatigue, weakness, fever, chills or night sweats. Patient denies any loss of vision, blurred vision. Patient denies any ringing  of the ears or hearing loss. No irregular heartbeat. Patient denies heart murmur or history of fainting. Patient denies any chest pain or pain radiating to her upper extremities. Patient denies any shortness of breath, difficulty breathing at night, cough or hemoptysis. Patient denies any swelling in the lower legs. Patient denies any nausea vomiting, vomiting of blood, or coffee ground material in the vomitus. Patient denies any stomach pain. Patient states has had normal bowel movements no significant constipation or diarrhea. Patient denies any dysuria, hematuria or significant  nocturia. Patient denies any problems walking, swelling in the joints or loss of balance. Patient denies any skin changes, loss of hair or loss of weight. Patient denies any excessive worrying or anxiety or significant depression. Patient denies any problems with insomnia. Patient denies excessive thirst, polyuria, polydipsia. Patient denies any swollen glands, patient denies easy bruising or easy bleeding. Patient denies any recent infections, allergies or URI. Patient "s visual fields have not changed significantly in recent time.   PHYSICAL EXAM: BP 107/67 (BP Location: Right Arm)   Pulse 73   Temp (!) 97.5 F (36.4 C)   Resp 20   Ht '5\' 8"'$  (1.727 m)   Wt 200 lb (90.7 kg)   SpO2 96%   BMI 30.41 kg/m  Patient still has some slight decrease strength in his left upper extremity and left lower extremity also some alteration to his proprioception in his lower extremities bilaterally.  Crude visual fields within normal range.  Well-developed  well-nourished patient in NAD. HEENT reveals PERLA, EOMI, discs not visualized.  Oral cavity is clear. No oral mucosal lesions are identified. Neck is clear without evidence of cervical or supraclavicular adenopathy. Lungs are clear to A&P. Cardiac examination is essentially unremarkable with regular rate and rhythm without murmur rub or thrill. Abdomen is benign with no organomegaly or masses noted. Motor sensory and DTR levels are equal and symmetric in the upper and lower extremities. Cranial nerves II through XII are grossly intact. Proprioception is intact. No peripheral adenopathy or edema is identified. No motor or sensory levels are noted. Crude visual fields are within normal range.  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  At this time like to go ahead with a hypofractionated course of treatment to 35 Gray in 5 fractions using IMRT treatment planning and delivery.  Risks and benefits of treatment including skin reaction loss of hair fatigue alteration of blood counts all were described in detail to the patient.  We will use MRI fusion study to plan her radiation fields.  Patient and wife comprehend treatment plan well.  I have scheduled him for simulation today and will have his treatment started by midweek.  I would like to take this opportunity to thank you for allowing me to participate in the care of your patient.Noreene Filbert, MD

## 2022-07-31 DIAGNOSIS — I619 Nontraumatic intracerebral hemorrhage, unspecified: Secondary | ICD-10-CM | POA: Diagnosis not present

## 2022-07-31 DIAGNOSIS — Z515 Encounter for palliative care: Secondary | ICD-10-CM

## 2022-07-31 DIAGNOSIS — Z7189 Other specified counseling: Secondary | ICD-10-CM | POA: Diagnosis not present

## 2022-07-31 DIAGNOSIS — Z66 Do not resuscitate: Secondary | ICD-10-CM

## 2022-07-31 DIAGNOSIS — I618 Other nontraumatic intracerebral hemorrhage: Secondary | ICD-10-CM | POA: Diagnosis not present

## 2022-07-31 LAB — GLUCOSE, CAPILLARY
Glucose-Capillary: 129 mg/dL — ABNORMAL HIGH (ref 70–99)
Glucose-Capillary: 189 mg/dL — ABNORMAL HIGH (ref 70–99)
Glucose-Capillary: 250 mg/dL — ABNORMAL HIGH (ref 70–99)

## 2022-07-31 NOTE — Consult Note (Signed)
Palliative Care Consult Note                                  Date: 07/31/2022   Patient Name: Tony Huffman  DOB: May 09, 1946  MRN: [id number]  Age / Sex: 76 y.o., male  PCP: Tonia Ghent, MD Referring Physician: Shawna Clamp, MD  Reason for Consultation: Establishing goals of care  HPI/Patient Profile: 76 y.o. 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 Patient now has recurrence of his disease with mets to the lungs, left nephrectomy site and pancreas. Patient declined repeat biopsy due to previous unsuccessful biopsies at outside facilities.  He has been on hospice but is currently on palliative care. His other PMH includes, hypothyroidism, hypertension, obstructive sleep apnea, adrenal insufficiency and diabetes mellitus. He presented to the ED for left upper extremity weakness which he noted when he woke up in the morning.  He fell on the morning of his admission while attempting to ambulate because he was unable to hold onto his rolling walker.  His symptoms improved after he arrived in the ED. CT head showed acute intraparenchymal hemorrhage in the superior medial left parietal lobe.  Suspect this to be hemorrhagic metastatic lesion given history of metastatic RCC/Pancreatic. Neurosurgery was consulted recommended MRI.  Oncology and radiation oncology consulted.  Patient is started on radiation therapy, not a surgical candidate.  He was admitted on 07/29/2022 with cerebral parenchymal hemorrhage, RCC, and others.   PMT was consulted for goals of care conversations.  Past Medical History:  Diagnosis Date   Adrenal insufficiency ([place])    Anxiety    Chronic kidney disease, stage 3 ([place]) 11/13/2011   Erectile dysfunction    GERD (gastroesophageal reflux disease)    Gout 09/04/2013   H/O atrial flutter  03/07/2017   Hyperlipidemia 06/16/2014   Hypertension    Hypothyroidism (acquired) 04/14/2014   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[place]) 11/10/2010   OSA (obstructive sleep apnea) 07/24/2016   Pancreatic mass 08/01/2018   Renal carcinoma ([place]) 06/16/2015   RLS (restless legs syndrome) 07/24/2016   Sleep apnea    Testicular hypofunction    Tussive syncopes 10/15/2013   Type 2 diabetes mellitus ([place])    Vitamin D deficiency disease     Subjective:   This NP Walden Field reviewed medical records, received report from team, assessed the patient and then meet at the patient's bedside to discuss diagnosis, prognosis, GOC, EOL wishes disposition and options.  I met with the patient at the bedside.  No family was present.  He states his wife is currently in the ED with hand weakness and a history of CVA.   Concept of Palliative Care was introduced as specialized medical care for people and their families living with serious illness.  If focuses on providing relief from the symptoms and stress of a serious illness.  The goal is to improve quality of life for both the patient and the family. Values and goals of care important to patient and family were attempted to be elicited.  Created space and opportunity for patient  and family to explore thoughts and feelings regarding current medical situation   Natural trajectory and current clinical status were discussed. Questions and concerns addressed. Patient  encouraged to call with questions or concerns.    Patient/Family Understanding of  Illness: He understands his history of cancer.  He was on hosp[facility] for 2 [facility], which is switched to [facility] after moving to the area.  He is disenrolled with hospice after 2 years in order to get PT to help with weakness.  He has been doing PT here at this facility for a year.  His arm had been working for 2 days and had numbness and could not use his rollator so he came to  the ED for which identified to test and he was admitted at which point the feeling came back.  He knows he has a bleeding tumor in his brain from his renal cell carcinoma that is now mets to pancreas and brain.  We had further discussion on his current chronic comorbidities, cancer history, and acute presentations.  Life Review: He is married to his wife tomorrow.  They have been married for 3 years.  He has 2 daughters from a previous marriage and 3 grandchildren.  2 of the grandchildren live in [place] and 1 lives in [place].  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  He is a bring him happiness include "[place] train" which is a dominant team and his musical acts.  He states that his wife is a Nurse, children's.  He was a music major on the tuba at [employer] and did some gradual work at [employer].  Patient Values: Family  Goals: Having as much time as possible to spend with his wife, quality of life  Today's Discussion: In addition to discussion described above we had substantial discussion in various areas.  At this point he has he has been fighting renal cell carcinoma.  They previously metastasized to his pancreas for which he was on hospice but declined after 2 years in order to have physical therapy to attempt strengthening.  He currently lives at the independent living section at [facility], but states they do have an assisted living and skilled nursing facility.  With his current situation he is not a surgical candidate.  He has declined surgery anyway and is also declined biopsy.  He is excepted offer for palliative radiation for time and comfort.  He does not wish to have any other cancer treatments.  At this point we discussed 3 enrolling with hospice in order to maximize the quality and amount of time he may have with his  wife.  During our conversation he is quite fixated on his wife's help.  He states he is in the emergency room with hand numbness and has a history of CVA so he is quite worried.  I offered to go see her in the ED to check on her and ask for her nurse to call the patient with an update.  He readily accepted my offer.    I was able to go down to the ED with the patient was in CT.  A family friend was there and states that she will come out to visit the patient and give him an update.  I provided emotional and general support through therapeutic listening, empathy, sharing of stories, therapeutic touch, and other techniques. I answered all questions and addressed all concerns to the best of my ability.  Review of Systems  Respiratory:  Negative for chest tightness and shortness of breath.   Cardiovascular:  Negative for chest pain.  Gastrointestinal:  Negative for abdominal pain, nausea and vomiting.    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  Constitutional:      General: He is not in acute distress.    Appearance: Normal appearance.  HENT:     Head: Normocephalic and atraumatic.  Cardiovascular:     Rate and Rhythm: Normal rate.  Pulmonary:     Effort: Pulmonary effort is normal. No respiratory distress.  Abdominal:     General: Abdomen is flat.     Palpations: Abdomen is soft.     Tenderness: There is no abdominal tenderness.  Skin:    General: Skin is warm and dry.  Neurological:     General: No focal deficit present.  Psychiatric:        Mood and Affect: Mood normal.        Behavior: Behavior normal.     Comments: Intermittently tearful     Vital Signs:  BP 114/64 (BP Location: Right Arm)   Pulse 71   Temp (!) 97.5 F (36.4 C)   Resp 17   Ht 5' 8" (1.727 m)   Wt 90.7 kg    SpO2 96%   BMI 30.41 kg/m   Palliative Assessment/Data: 40-50%    Advanced Care Planning:   Existing Vynca/ACP Documentation: DNR dated 05/28/2018 MOST form dated 05/28/2018  Primary Decision Maker: PATIENT  Code Status/Advance Care Planning: DNR  A discussion was had today regarding advanced directives. Concepts specific to code status, artifical feeding and hydration, continued IV antibiotics and rehospitalization was had.  The difference between a aggressive medical intervention path and a palliative comfort care path for this patient at this time was had.   Decisions/Changes to ACP: None today  Assessment & Plan:   Impression: 76 year old male with a history of RCC metastasis to the pancreas now with a metastatic lesion in the brain which is hemorrhagic.  Not a surgical candidate, and is declining other treatment.  He has excepted palliative radiation plans to begin in the next day or 2.  It is anticipated he will complete a course of 5 total treatments.  I have spoken with hospice and they expressed ability to accept him for hospice services at his current [facility] facility after palliative radiation is complete.  Overall prognosis is poor.  SUMMARY OF RECOMMENDATIONS   Remain DNR Treated treatable for now Continue with plans for palliative radiation Anticipated enrollment with hospice at [facility] with [facility] after radiation complete PMT will continue to follow  Symptom Management:  Per primary team PMT is available to assist  Prognosis:  < 6 months  Discharge Planning:  Independent living community/ALF/LTC with hospice services    Discussed with: Patient, family, medical team, nursing team, [facility] team, hospice liaison    Thank you for allowing [place] to participate in the care of Tony Huffman PMT will continue to support holistically.  Time Total: 120 min  Greater than 50%  of this time was spent counseling and coordinating care related to the above assessment  and plan.  Signed by: Walden Field, NP Palliative Medicine Team  Team Phone # [phone] (Nights/Weekends)  07/31/2022, 12:31 PM

## 2022-07-31 NOTE — Progress Notes (Signed)
PT Cancellation Note  Patient Details Name: Tony Huffman MRN: [id number] DOB: 01/28/1946   Cancelled Treatment:    Reason Eval/Treat Not Completed: Other (comment) (Spoke with patient who is emotional, and reports he is concerned about his spouse who went to the ED not long ago for her own medical issues.  Will hold PT  and follow up another time when patient can participate.)  Minna Merritts, PT, MPT  Tony Huffman 07/31/2022, 11:44 AM

## 2022-07-31 NOTE — TOC Progression Note (Signed)
Transition of Care [facility]) - Progression Note    Patient Details  Name: Larone Kliethermes MRN: [id number] Date of Birth: 08-21-1946  [facility]) CM/SW [place], LCSW Phone Number: 07/31/2022, 4:31 PM  Clinical Narrative:   Notified [facility] admissions coordinator and [place] liaison that patient and wife are interested in hospice services. [facility] liaison will talk with patient and wife.  Expected Discharge Plan: [place] Barriers to Discharge: Continued Medical Work up  Expected Discharge Plan and Services Expected Discharge Plan: [place] Choice: [place] arrangements for the past 2 months: [place]                                       Social Determinants of Health (SDOH) Interventions    Readmission Risk Interventions     No data to display

## 2022-07-31 NOTE — Progress Notes (Signed)
[facility] Liaison Referral Note  Referral received that patient and family would like to pursue Hospice care at discharge.  Hospital Liaison spoke with wife, [name], and plan to meet tomorrow morning to discuss services.   Thank you for allowing [place] to participate in this patient's care.    Please call with any questions or concerns.  Kenna Gilbert BSN, RN  [facility] Liaison  [phone]

## 2022-07-31 NOTE — Plan of Care (Signed)
  Problem: Education: Goal: Knowledge of disease or condition will improve Outcome: Progressing   Problem: Coping: Goal: Will identify appropriate support needs Outcome: Progressing   Problem: Activity: Goal: Risk for activity intolerance will decrease Outcome: Progressing   Problem: Elimination: Goal: Will not experience complications related to urinary retention Outcome: Progressing

## 2022-07-31 NOTE — Progress Notes (Addendum)
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 Patient now has recurrence of his disease with mets to the lungs, left nephrectomy site and pancreas. Patient declined repeat biopsy due to previous unsuccessful biopsies at outside facilities.  He has been on hospice but is currently on palliative care. His other PMH includes, hypothyroidism, hypertension, obstructive sleep apnea, adrenal insufficiency and diabetes mellitus. He presented to the ED for left upper extremity weakness which he noted when he woke up in the morning.  He fell on the morning of his admission while attempting to ambulate because he was unable to hold onto his rolling walker.  His symptoms improved after he arrived in the ED. CT head showed acute intraparenchymal hemorrhage in the superior medial left parietal lobe.  Suspect this to be hemorrhagic metastatic lesion given history of metastatic pancreatic carcinoma. Neurosurgery was consulted recommended MRI.  Oncology and radiation oncology consulted.  Patient is started on radiation therapy.  Assessment & Plan:   Principal Problem:   Cerebral parenchymal hemorrhage (HCC) Active Problems:   Renal cell carcinoma, left (HCC)   Addisons disease ([place])   Hypothyroidism (acquired)   Diabetes mellitus without complication ([place])  Intraparenchymal hemorrhage: Patient presented in the ED for the evaluation of left upper extremity weakness which was noted upon waking up in the morning. CT scan of the head without contrast shows 2.7 x 1.4 x 2.1 cm focus of acute parenchymal hemorrhage in the superior, medial left parietal lobe.  Suspect this to be a hemorrhagic metastatic lesion given the history of metastatic  pancreatic carcinoma. MRI brain : 2.6 cm enhancing mass lesion in the medial left parietal lobe associated with the acute hemorrhage is compatible with a renal cell carcinoma metastasis. Neurosurgery consulted, patient is not interested in any neurosurgical intervention. Oncology and radiation oncology consulted.  Patient is starting radiation therapy 08/02/2022. Patient is interested in palliative radiation therapy, not in neurosurgical intervention. Needs PT and OT evaluation   Renal cell carcinoma, left: Patient has a known history of metastatic left renal cell carcinoma and is status post left nephrectomy and bilateral adrenalectomy with new metastatic disease to the pancreas, lungs and nephrectomy bed. Oncology consulted, Patient is started on radiation. Patient continue to follow-up with [facility] outpatient.   Type 2 diabetes with hyperglycemia: Hold metformin Carb modified diet, Regular insulin sliding scale.   Hypothyroidism: Continue Synthroid.   Addison's disease Continue Florinef and hydrocortisone.  History of paroxysmal A-fib: Heart rate controlled not on anticoagulation:   DVT prophylaxis:  SCDs Code Status: DNR Family Communication: Wife at bed side. Disposition Plan:    Status is: Inpatient Remains inpatient appropriate because: Admitted for left upper extremity weakness found to have metastatic hemorrhagic lesion.  Patient started on radiation therapy, neurosurgery consulted.  Patient is not interested in neurosurgical intervention rather interested in palliative radiation therapy.   Anticipated discharge to SNF once medically clear.  Consultants:  Neuro surgery Oncology Radiation oncology  Procedures: CT head, MRI brain  Antimicrobials:None   Subjective: Patient was seen and examined at bedside.  Overnight events noted.   Patient reports left upper extremity weakness is improving. He denies any headache or dizziness or numbness. He is starting  radiation treatment on 08/02/2022.  Objective: Vitals:  07/31/22 0038 07/31/22 0351 07/31/22 0812 07/31/22 1150  BP: (!) 90/56 105/69 104/66 114/64  Pulse: 67 62 65 71  Resp: '16 16 18 17  '$ Temp: 97.6 F (36.4 C) 97.9 F (36.6 C) (!) 97.5 F (36.4 C)   TempSrc:      SpO2: 93% 97% 96% 96%  Weight:      Height:        Intake/Output Summary (Last 24 hours) at 07/31/2022 1226 Last data filed at 07/30/2022 1700 Gross per 24 hour  Intake 240 ml  Output --  Net 240 ml   Filed Weights   07/29/22 0912  Weight: 90.7 kg    Examination:  General exam: Appears comfortable, not in any acute distress, very deconditioned. Respiratory system: CTA bilaterally, respiratory effort normal, RR 15. Cardiovascular system: S1-S2 heard, regular rate and rhythm, no murmur. Gastrointestinal system: Abdomen is soft, non tender, non distended, BS+ Central nervous system: Alert and oriented x 3. No focal neurological deficits. Extremities: No edema, no cyanosis, no clubbing. Skin: No rashes, lesions or ulcers Psychiatry: Judgement and insight appear normal. Mood & affect appropriate.     Data Reviewed: I have personally reviewed following labs and imaging studies  CBC: Recent Labs  Lab 07/29/22 0905  WBC 6.4  NEUTROABS 3.6  HGB 14.1  HCT 43.1  MCV 82.1  PLT 809   Basic Metabolic Panel: Recent Labs  Lab 07/29/22 0905  NA 137  K 4.2  CL 103  CO2 25  GLUCOSE 164*  BUN 24*  CREATININE 1.02  CALCIUM 9.3   GFR: Estimated Creatinine Clearance: 67.4 mL/min (by C-G formula based on SCr of 1.02 mg/dL). Liver Function Tests: Recent Labs  Lab 07/29/22 0905  AST 21  ALT 23  ALKPHOS 86  BILITOT 0.9  PROT 7.2  ALBUMIN 3.9   No results for input(s): "LIPASE", "AMYLASE" in the last 168 hours. No results for input(s): "AMMONIA" in the last 168 hours. Coagulation Profile: Recent Labs  Lab 07/29/22 0905  INR 1.1   Cardiac Enzymes: No results for input(s): "CKTOTAL", "CKMB",  "CKMBINDEX", "TROPONINI" in the last 168 hours. BNP (last 3 results) No results for input(s): "PROBNP" in the last 8760 hours. HbA1C: Recent Labs    07/29/22 0905  HGBA1C 7.2*   CBG: Recent Labs  Lab 07/30/22 1152 07/30/22 1604 07/30/22 2139 07/31/22 0834 07/31/22 1150  GLUCAP 292* 281* 260* 129* 189*   Lipid Profile: Recent Labs    07/30/22 0421  CHOL 173  HDL 31*  LDLCALC 90  TRIG 260*  CHOLHDL 5.6   Thyroid Function Tests: No results for input(s): "TSH", "T4TOTAL", "FREET4", "T3FREE", "THYROIDAB" in the last 72 hours. Anemia Panel: No results for input(s): "VITAMINB12", "FOLATE", "FERRITIN", "TIBC", "IRON", "RETICCTPCT" in the last 72 hours. Sepsis Labs: No results for input(s): "PROCALCITON", "LATICACIDVEN" in the last 168 hours.  No results found for this or any previous visit (from the past 240 hour(s)).   Radiology Studies: MR BRAIN W WO CONTRAST  Result Date: 07/29/2022 CLINICAL DATA:  Intracranial hemorrhage. Known renal cell carcinoma. Pancreatic mass. EXAM: MRI HEAD WITHOUT AND WITH CONTRAST TECHNIQUE: Multiplanar, multiecho pulse sequences of the brain and surrounding structures were obtained without and with intravenous contrast. CONTRAST:  22m GADAVIST GADOBUTROL 1 MMOL/ML IV SOLN COMPARISON:  CT head without contrast 07/29/2022. MR head without contrast six/eighteen/twenty two FINDINGS: Brain: A 2.6 x 1.5 x 1.5 cm enhancing mass lesion in the left medial left parietal lobe is associated with the area of acute hemorrhage.  No other enhancing lesions or focal hemorrhage is evident. No other foci of susceptibility are present. Mild edema surrounds the acute hemorrhage. Advanced atrophy is again noted. The ventricles are proportionate to the degree of atrophy. No significant extraaxial fluid collection is present. Vascular: Flow is present in the major intracranial arteries. Skull and upper cervical spine: The craniocervical junction is normal. Upper cervical  spine is within normal limits. Marrow signal is unremarkable. Sinuses/Orbits: The paranasal sinuses and mastoid air cells are clear. Bilateral lens replacements are noted. Globes and orbits are otherwise unremarkable. IMPRESSION: 1. 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 2. No additional metastases present. 3. Stable moderate generalized atrophy. Electronically Signed   By: San Morelle M.D.   On: 07/29/2022 15:27    Scheduled Meds:  cholecalciferol  5,000 Units Oral Daily   cyanocobalamin  1,000 mcg Oral QODAY   fludrocortisone  0.05 mg Oral QODAY   fludrocortisone  0.1 mg Oral QODAY   gabapentin  900 mg Oral QHS   hydrocortisone  15 mg Oral QAC breakfast   hydrocortisone  5 mg Oral BID AC   insulin aspart  0-15 Units Subcutaneous TID WC   levothyroxine  75 mcg Oral Q0600   mirabegron ER  50 mg Oral Daily   multivitamin-lutein  1 capsule Oral Daily   sodium chloride flush  3 mL Intravenous Q12H   Continuous Infusions:  sodium chloride       LOS: 2 days    Time spent: 35 mins    Ki Luckman, MD [facility]   If 7PM-7AM, please contact night-coverage

## 2022-07-31 NOTE — Evaluation (Signed)
Occupational Therapy Evaluation Patient Details Name: Tony Huffman MRN: [id number] DOB: 15-Apr-1946 Today's Date: 07/31/2022   History of Present Illness Patient is a  76 y.o. male who has left-sided hemorrhagic mass.  He has a history of renal cell carcinoma status post nephrectomy and chemotherapy. History of chronic weakness on the left side that has been present for at least 6 to 7 years. Presented to the ED with L side weakness that was worse than normal.   Clinical Impression   Patient presenting with decreased independence in self-care, functional mobility, balance, safety, and strength/endurance. Patient reports he has a walk-in shower, comfort height toilet, uses rollator at night in home, [place] in community, and has a transport chair.  Patient reports mod I at baseline and has been requiring assistance with LB dressing and other ADL tasks recently. Patient currently functioning at min A for bed mobility supine>sit. Min A for transfers sit>stand using RW. Patient was able to perform grooming tasks at sink, posterior lean observed while standing at sink, requiring assistance to correct. Patient was able to ambulate ~80 ft in hallway with min guard/ very close supervision. Patient required frequent VC for left foot drop and proper use of RW. Patient placed back in supine with min guard. Patient left in bed with call bell in reach, bed alarm set, and all needs met. Patient will benefit from acute OT to increase overall independence in the areas of ADLs, functional mobility,  in order to safely discharge to the next venue of care.       Recommendations for follow up therapy are one component of a multi-disciplinary discharge planning process, led by the attending physician.  Recommendations may be updated based on patient status, additional functional criteria and insurance authorization.   Follow Up Recommendations  Skilled nursing-short term rehab (<3 hours/day)    Assistance Recommended at  Discharge Intermittent Supervision/Assistance  Patient can return home with the following A little help with walking and/or transfers;A little help with bathing/dressing/bathroom;Assistance with cooking/housework;Help with stairs or ramp for entrance    Functional Status Assessment  Patient has had a recent decline in their functional status and demonstrates the ability to make significant improvements in function in a reasonable and predictable amount of time.  Equipment Recommendations  Other (comment) (Defer to next venue of care.)    Recommendations for Other Services       Precautions / Restrictions Precautions Precautions: Fall Restrictions Weight Bearing Restrictions: No      Mobility Bed Mobility Overal bed mobility: Needs Assistance Bed Mobility: Supine to Sit     Supine to sit: Min assist Sit to supine: Min guard        Transfers Overall transfer level: Needs assistance Equipment used: Rolling walker (2 wheels) Transfers: Sit to/from Stand Sit to Stand: Min assist                  Balance Overall balance assessment: Needs assistance, History of Falls Sitting-balance support: Feet supported Sitting balance-Leahy Scale: Fair Sitting balance - Comments: posterior trunk lean occasionally with dyanmic activity. cues to maintain midline posture. Postural control: Posterior lean Standing balance support: Bilateral upper extremity supported, During functional activity, Reliant on assistive device for balance Standing balance-Leahy Scale: Fair                             ADL either performed or assessed with clinical judgement   ADL Overall ADL's : Needs assistance/impaired  Lower Body Dressing Details (indicate cue type and reason): Anticipates needing assistance with LB dressing tasks.                     Vision Baseline Vision/History: 1 Wears glasses Patient Visual Report: No change from baseline               Pertinent Vitals/Pain Pain Assessment Pain Assessment: No/denies pain     Hand Dominance Right   Extremity/Trunk Assessment Upper Extremity Assessment Upper Extremity Assessment: Generalized weakness;LUE deficits/detail LUE Sensation: WNL   Lower Extremity Assessment Lower Extremity Assessment: Generalized weakness;LLE deficits/detail LLE Sensation: WNL       Communication Communication Communication: No difficulties   Cognition Arousal/Alertness: Awake/alert Behavior During Therapy: WFL for tasks assessed/performed Overall Cognitive Status: Within Functional Limits for tasks assessed                                                  Home Living Family/patient expects to be discharged to:: Private residence Living Arrangements: Spouse/significant other Available Help at Discharge: Family;Available PRN/intermittently Type of Home: House Home Access: Level entry     Home Layout: One level     Bathroom Shower/Tub: Occupational psychologist: Handicapped height     Home Equipment: Rollator (4 wheels);Standard Walker;Cane - single point;Transport chair          Prior Functioning/Environment Prior Level of Function : Independent/Modified Independent;History of Falls (last six months)             Mobility Comments: history of multiple falls. patient goes to oupatient PT reguarly for balance issues. He is Mod I at baseline with rollator ADLs Comments: usually Mod I with increased assistance required over the past several days with ADLs        OT Problem List: Decreased strength;Decreased activity tolerance;Impaired balance (sitting and/or standing);Decreased safety awareness      OT Treatment/Interventions: Self-care/ADL training;Therapeutic exercise;Therapeutic activities    OT Goals(Current goals can be found in the care plan section) Acute Rehab OT Goals Patient Stated Goal: to return home OT Goal Formulation:  With patient Time For Goal Achievement: 08/14/22 ADL Goals Pt Will Perform Lower Body Bathing: with modified independence Pt Will Perform Lower Body Dressing: with modified independence Pt Will Transfer to Toilet: with modified independence Pt Will Perform Toileting - Clothing Manipulation and hygiene: with modified independence Pt/caregiver will Perform Home Exercise Program: Left upper extremity;Increased strength;With theraband;Independently  OT Frequency: Min 2X/week       AM-PAC OT "6 Clicks" Daily Activity     Outcome Measure Help from another person eating meals?: None   Help from another person toileting, which includes using toliet, bedpan, or urinal?: A Little Help from another person bathing (including washing, rinsing, drying)?: A Little Help from another person to put on and taking off regular upper body clothing?: A Little Help from another person to put on and taking off regular lower body clothing?: A Little 6 Click Score: 16   End of Session Equipment Utilized During Treatment: Rolling walker (2 wheels) Nurse Communication: Mobility status  Activity Tolerance: Patient tolerated treatment well Patient left: in bed;with call bell/phone within reach;with bed alarm set  OT Visit Diagnosis: Unsteadiness on feet (R26.81);Muscle weakness (generalized) (M62.81)                Time: 1975-8832  OT Time Calculation (min): 13 min Charges:       Tomasa Blase, OTS 07/31/2022, 10:07 AM

## 2022-08-01 ENCOUNTER — Other Ambulatory Visit: Payer: Self-pay

## 2022-08-01 ENCOUNTER — Ambulatory Visit: Payer: Medicare Other

## 2022-08-01 DIAGNOSIS — C7931 Secondary malignant neoplasm of brain: Secondary | ICD-10-CM | POA: Insufficient documentation

## 2022-08-01 DIAGNOSIS — I4891 Unspecified atrial fibrillation: Secondary | ICD-10-CM | POA: Diagnosis not present

## 2022-08-01 DIAGNOSIS — Z510 Encounter for antineoplastic radiation therapy: Secondary | ICD-10-CM | POA: Insufficient documentation

## 2022-08-01 DIAGNOSIS — I619 Nontraumatic intracerebral hemorrhage, unspecified: Secondary | ICD-10-CM | POA: Diagnosis not present

## 2022-08-01 DIAGNOSIS — Z66 Do not resuscitate: Secondary | ICD-10-CM | POA: Diagnosis not present

## 2022-08-01 DIAGNOSIS — Z7189 Other specified counseling: Secondary | ICD-10-CM | POA: Diagnosis not present

## 2022-08-01 DIAGNOSIS — E271 Primary adrenocortical insufficiency: Secondary | ICD-10-CM | POA: Diagnosis not present

## 2022-08-01 DIAGNOSIS — Z515 Encounter for palliative care: Secondary | ICD-10-CM | POA: Diagnosis not present

## 2022-08-01 DIAGNOSIS — I618 Other nontraumatic intracerebral hemorrhage: Secondary | ICD-10-CM | POA: Diagnosis not present

## 2022-08-01 DIAGNOSIS — I4892 Unspecified atrial flutter: Secondary | ICD-10-CM

## 2022-08-01 LAB — RAD ONC ARIA SESSION SUMMARY
Course Elapsed Days: 0
Plan Fractions Treated to Date: 1
Plan Prescribed Dose Per Fraction: 6 Gy
Plan Total Fractions Prescribed: 5
Plan Total Prescribed Dose: 30 Gy
Reference Point Dosage Given to Date: 6 Gy
Reference Point Session Dosage Given: 6 Gy
Session Number: 1

## 2022-08-01 LAB — GLUCOSE, CAPILLARY
Glucose-Capillary: 182 mg/dL — ABNORMAL HIGH (ref 70–99)
Glucose-Capillary: 142 mg/dL — ABNORMAL HIGH (ref 70–99)
Glucose-Capillary: 187 mg/dL — ABNORMAL HIGH (ref 70–99)
Glucose-Capillary: 325 mg/dL — ABNORMAL HIGH (ref 70–99)
Glucose-Capillary: 259 mg/dL — ABNORMAL HIGH (ref 70–99)

## 2022-08-01 NOTE — Assessment & Plan Note (Signed)
History of paroxysmal atrial fibrillation. Rate well controlled.  Not on any anticoagulation

## 2022-08-01 NOTE — Hospital Course (Addendum)
Taken from prior notes.  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 Patient now has recurrence of his disease with mets to the lungs, left nephrectomy site and pancreas. Patient declined repeat biopsy due to previous unsuccessful biopsies at outside facilities.  He has been on hospice but is currently on palliative care. His other PMH includes, hypothyroidism, hypertension, obstructive sleep apnea, adrenal insufficiency and diabetes mellitus. He presented to the ED for left upper extremity weakness which he noted when he woke up in the morning.  He fell on the morning of his admission while attempting to ambulate because he was unable to hold onto his rolling walker.  His symptoms improved after he arrived in the ED. CT head showed acute intraparenchymal hemorrhage in the superior medial left parietal lobe.  Suspect this to be hemorrhagic metastatic lesion given history of metastatic RCC/Pancreatic. Neurosurgery was consulted recommended MRI.  Oncology and radiation oncology consulted.  Patient is started on radiation therapy, not a surgical candidate.  Palliative care and hospice services were also consulted.  Family would like to take him back to [facility] with hospice.  Hospice will resume care on 11/7 after completing current radiation therapy.  Medically stable to go back to his facility if they can provide transportation for daily radiation until 11/7.  Patient currently medically stable, very guarded prognosis with disease progression.  He will continue with palliative radiation until 11/7 and hospice will take over after that. Patient received todays radiation before discharge.  Will need transportation from tomorrow.  He will continue on current medications and follow-up with his providers for further recommendations.

## 2022-08-01 NOTE — Progress Notes (Signed)
Physical Therapy Treatment Patient Details Name: Tony Huffman MRN: [id number] DOB: Jun 19, 1946 Today's Date: 08/01/2022   History of Present Illness Patient is a  76 y.o. male who has left-sided hemorrhagic mass.  He has a history of renal cell carcinoma status post nephrectomy and chemotherapy. History of chronic weakness on the left side that has been present for at least 6 to 7 years. Presented to the ED with L side weakness that was worse than normal.    PT Comments    Pt was A and oriented x 3 but does have some cognition deficits come to light throughout session. Pt has poor insight of his deficits and poor overall safety awareness. He is a high fall risk. Per spouse, " He's had more than 10 falls in the past year." Pt was agreeable to session and OOB activity. Tolerated exiting R side of bed and ambulating with cane ~ 60 ft. Does become more unsteady after firt 30 ft or so. Min assist advance to mod assist towards the end of gait trials. Pt does endorse fatigue however HR and sao2 stable. Recommend DC to SNF to maximize his independence while decreasing caregiver burden.   Recommendations for follow up therapy are one component of a multi-disciplinary discharge planning process, led by the attending physician.  Recommendations may be updated based on patient status, additional functional criteria and insurance authorization.  Follow Up Recommendations  Skilled nursing-short term rehab (<3 hours/day) (planning to go to skilled side of [facility])     Assistance Recommended at Discharge Frequent or constant Supervision/Assistance  Patient can return home with the following A little help with walking and/or transfers;A little help with bathing/dressing/bathroom;Assist for transportation   Equipment Recommendations  Rolling walker (2 wheels)       Precautions / Restrictions Precautions Precautions: Fall Restrictions Weight Bearing Restrictions: No     Mobility  Bed Mobility Overal  bed mobility: Needs Assistance Bed Mobility: Supine to Sit  Supine to sit: Min assist    General bed mobility comments: Min assist + vcs for safety to exit R side of bed    Transfers Overall transfer level: Needs assistance Equipment used: Straight cane (personal cane "Hurricane.") Transfers: Sit to/from Stand Sit to Stand: Min assist    General transfer comment: vcs for improved technique    Ambulation/Gait Ambulation/Gait assistance: Min assist, Mod assist Gait Distance (Feet): 60 Feet Assistive device: Rolling walker (2 wheels) Gait Pattern/deviations: Decreased stride length, Trunk flexed, Knee flexed in stance - left, Knee flexed in stance - right Gait velocity: decreased     General Gait Details: Pt ambulated with SPC + min assist. During last 30 ft pt needed more assistance + more vcs to prevent falling. Pt is unsteady and does present with severe balance deficits. recommend [place] of RW at all times until balance improves    Balance Overall balance assessment: Needs assistance, History of Falls Sitting-balance support: Feet supported Sitting balance-Leahy Scale: Fair     Standing balance support: Bilateral upper extremity supported, During functional activity, Reliant on assistive device for balance Standing balance-Leahy Scale: Poor Standing balance comment: poor dynamic balance wihtout BUE support. unsteainess noted throughout all standing activity. spouse reports more than 10 falls in past year       Cognition Arousal/Alertness: Awake/alert Behavior During Therapy: WFL for tasks assessed/performed Overall Cognitive Status: History of cognitive impairments - at baseline    General Comments: Pt is A and oriented x 3 but does have some cognition concerns throughout session. Very  poor safety awareness and poor insight of his deficits           General Comments General comments (skin integrity, edema, etc.): Discussed D/C disposition.      Pertinent Vitals/Pain  Pain Assessment Pain Assessment: No/denies pain     PT Goals (current goals can now be found in the care plan section) Acute Rehab PT Goals Patient Stated Goal: Go home and eventually go to outpatient Progress towards PT goals: Progressing toward goals    Frequency    7X/week      PT Plan Current plan remains appropriate       AM-PAC PT "6 Clicks" Mobility   Outcome Measure  Help needed turning from your back to your side while in a flat bed without using bedrails?: A Little Help needed moving from lying on your back to sitting on the side of a flat bed without using bedrails?: A Little Help needed moving to and from a bed to a chair (including a wheelchair)?: A Little Help needed standing up from a chair using your arms (e.g., wheelchair or bedside chair)?: A Little Help needed to walk in hospital room?: A Little Help needed climbing 3-5 steps with a railing? : A Lot 6 Click Score: 17    End of Session Equipment Utilized During Treatment: Gait belt Activity Tolerance: Patient tolerated treatment well Patient left: in bed;with call bell/phone within reach;with bed alarm set;with SCD's reapplied;with family/visitor present Nurse Communication: Mobility status PT Visit Diagnosis: Muscle weakness (generalized) (M62.81);Other abnormalities of gait and mobility (R26.89);History of falling (Z91.81)     Time: 6720-9470 PT Time Calculation (min) (ACUTE ONLY): 15 min  Charges:  $Gait Training: 8-22 mins                     Julaine Fusi PTA 08/01/22, 9:43 AM

## 2022-08-01 NOTE — TOC Progression Note (Signed)
Transition of Care [facility]) - Progression Note    Patient Details  Name: Tony Huffman MRN: [id number] Date of Birth: Dec 27, 1945  Transition of Care [facility]) CM/SW [place], [place] Phone Number: 08/01/2022, 3:38 PM  Clinical Narrative:     Patient to dc to [facility].   Seth Bake at [facility] aware patient will need transport to 4pm radiation apt tomorrow.   Patient's spouse can provide transport to radiation apts that are next Monday and Tuesday at 11:15 and Wednesday at 11 (lab apt).   Patient will start hospice services after 11/7 after finishing radiation.   Treatment team updated.    Expected Discharge Plan: [place] Barriers to Discharge: Continued Medical Work up  Expected Discharge Plan and Services Expected Discharge Plan: [place] Choice: [place] arrangements for the past 2 months: [place]                                       Social Determinants of Health (SDOH) Interventions    Readmission Risk Interventions     No data to display

## 2022-08-01 NOTE — Progress Notes (Signed)
[facility]) Hospital Liaison Referral Note:   Spoke with Mr. Seiden and wife, [name], at bedside to discuss hospice services and hospice philosophy.  Patient and wife would like to pursue hospice services once palliative brain radiation is completed.  Mr. Kost is expected to start radiation today 11/1, and complete treatment Tuesday 11/7.  Plan is for Mr. Ungaro to discharge to [facility].  It is unclear if he will remain in hospital for duration of radiation therapy or not.  [facility] Liaison team will continue to follow throughout admission.    Please send a completed and signed DNR with patient at time of discharge.  Please reach out with any questions or concerns.    Thank you for allowing [place] to participate in Mr. Sweeney care.  Kenna Gilbert BSN RN  [facility] Liaison [phone]

## 2022-08-01 NOTE — Progress Notes (Signed)
Occupational Therapy Treatment Patient Details Name: Tony Huffman MRN: [id number] DOB: [birth year] Today's Date: 08/01/2022   History of present illness Patient is a  76 y.o. male who has left-sided hemorrhagic mass.  He has a history of renal cell carcinoma status post nephrectomy and chemotherapy. History of chronic weakness on the left side that has been present for at least 6 to 7 years. Presented to the ED with L side weakness that was worse than normal.   OT comments  Patient in bed upon arrival, wife present, and agreeable to OT services. Patient was able to perform bed mobility supine<>sit with min A. Transferred sit >stand using SPC with min A. Patient ambulated ~30 ft in room with close supervision to bathroom and transferred to toilet with min guard. Poor dynamic balance without BUE support. Unsteadiness noted throughout all standing activity. Patient left in bed with call bell in reach, bed alarm set, and all needs met.   Recommendations for follow up therapy are one component of a multi-disciplinary discharge planning process, led by the attending physician.  Recommendations may be updated based on patient status, additional functional criteria and insurance authorization.    Follow Up Recommendations  Skilled nursing-short term rehab (<3 hours/day)    Assistance Recommended at Discharge Intermittent Supervision/Assistance  Patient can return home with the following  A little help with walking and/or transfers;A little help with bathing/dressing/bathroom;Assistance with cooking/housework;Help with stairs or ramp for entrance   Equipment Recommendations  Other (comment) (Defer to next venue of care.)       Precautions / Restrictions Precautions Precautions: Fall Restrictions Weight Bearing Restrictions: No       Mobility Bed Mobility Overal bed mobility: Needs Assistance Bed Mobility: Supine to Sit, Sit to Supine     Supine to sit: Min assist Sit to supine: Min  assist        Transfers   Equipment used: Straight cane Transfers: Sit to/from Stand Sit to Stand: Min assist           General transfer comment: vcs for improved technique     Balance Overall balance assessment: Needs assistance, History of Falls Sitting-balance support: Feet supported Sitting balance-Leahy Scale: Fair     Standing balance support: Bilateral upper extremity supported, During functional activity, Reliant on assistive device for balance   Standing balance comment: poor dynamic balance without BUE support. unsteainess noted throughout all standing activity.                           ADL either performed or assessed with clinical judgement   ADL Overall ADL's : Needs assistance/impaired     Grooming: Oral care;Min guard                   Toilet Transfer: Min guard                  Extremity/Trunk Assessment Upper Extremity Assessment Upper Extremity Assessment: Generalized weakness   Lower Extremity Assessment Lower Extremity Assessment: Generalized weakness;LLE deficits/detail         Cognition Arousal/Alertness: Awake/alert Behavior During Therapy: WFL for tasks assessed/performed Overall Cognitive Status: History of cognitive impairments - at baseline                                                General Comments Discussed  D/C disposition.    Pertinent Vitals/ Pain       Pain Assessment Pain Assessment: No/denies pain   Frequency  Min 2X/week        Progress Toward Goals  OT Goals(current goals can now be found in the care plan section)     Acute Rehab OT Goals Patient Stated Goal: to return home. OT Goal Formulation: With patient Time For Goal Achievement: 08/14/22   AM-PAC OT "6 Clicks" Daily Activity     Outcome Measure   Help from another person eating meals?: None   Help from another person toileting, which includes using toliet, bedpan, or urinal?: A Little Help  from another person bathing (including washing, rinsing, drying)?: A Little Help from another person to put on and taking off regular upper body clothing?: None Help from another person to put on and taking off regular lower body clothing?: A Little 6 Click Score: 17    End of Session Equipment Utilized During Treatment: Other (comment) (SPC)  OT Visit Diagnosis: Unsteadiness on feet (R26.81);Muscle weakness (generalized) (M62.81)   Activity Tolerance Patient tolerated treatment well   Patient Left in bed;with call bell/phone within reach;with bed alarm set   Nurse Communication Mobility status        Time: 6579-0383 OT Time Calculation (min): 13 min  Charges: OT General Charges $OT Visit: 1 Visit OT Treatments $Self Care/Home Management : 8-22 mins   Fara Worthy, OTS 08/01/2022, 12:20 PM

## 2022-08-01 NOTE — Progress Notes (Signed)
Progress Note   Patient: Tony Huffman CXK:[id number] DOB: [birth year] DOA: 07/29/2022     3 DOS: the patient was seen and examined on 08/01/2022   Brief hospital course: Taken from prior notes.  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 Patient now has recurrence of his disease with mets to the lungs, left nephrectomy site and pancreas. Patient declined repeat biopsy due to previous unsuccessful biopsies at outside facilities.  He has been on hospice but is currently on palliative care. His other PMH includes, hypothyroidism, hypertension, obstructive sleep apnea, adrenal insufficiency and diabetes mellitus. He presented to the ED for left upper extremity weakness which he noted when he woke up in the morning.  He fell on the morning of his admission while attempting to ambulate because he was unable to hold onto his rolling walker.  His symptoms improved after he arrived in the ED. CT head showed acute intraparenchymal hemorrhage in the superior medial left parietal lobe.  Suspect this to be hemorrhagic metastatic lesion given history of metastatic RCC/Pancreatic. Neurosurgery was consulted recommended MRI.  Oncology and radiation oncology consulted.  Patient is started on radiation therapy, not a surgical candidate.  Palliative care and hospice services were also consulted.  Family would like to take him back to [facility] with hospice.  Hospice will resume care on 11/7 after completing current radiation therapy.  Medically stable to go back to his facility if they can provide transportation for daily radiation until 11/7.   Assessment and Plan: * Cerebral parenchymal hemorrhage [facility]) Patient presents to the ER for evaluation of left upper extremity weakness noted upon awakening on the morning of his admission. CT scan of the head without contrast  shows 2.7 x 1.4 x 2.1 cm focus of acute parenchymal hemorrhage in the superior, medial left parietal lobe. Suspect this to be a hemorrhagic metastatic lesion given the history of metastatic pancreatic carcinoma. MRI brain : 2.6 cm enhancing mass lesion in the medial left parietal lobe associated with the acute hemorrhage is compatible with a renal cell carcinoma metastasis. Neurosurgery consulted, patient is not interested in any neurosurgical intervention. Oncology and radiation oncology consulted. Patient was started on radiation therapy today. Patient will be going under hospice services at his facility starting from 11/7 after completing current course of radiation.  Renal cell carcinoma, left (HCC) Patient has a known history of metastatic left renal cell carcinoma and is status post left nephrectomy and bilateral adrenalectomy with new metastatic disease to the pancreas, lungs and nephrectomy bed. Oncology consult  Diabetes mellitus without complication (HCC) Hold metformin -SSI  Hypothyroidism (acquired) Continue Synthroid  Atrial fibrillation and flutter (HCC) History of paroxysmal atrial fibrillation. Rate well controlled.  Not on any anticoagulation  Addisons disease ([place]) Continue Florinef and hydrocortisone   Subjective: Patient was seen and examined after getting first dose of radiation.  No new complaints or deficit.  Physical Exam: Vitals:   08/01/22 0129 08/01/22 0519 08/01/22 0758 08/01/22 1210  BP: 111/73 121/78 114/81 117/65  Pulse: 66 66 66 71  Resp: '18 18 18 18  '$ Temp: 97.6 F (36.4 C) 97.9 F (36.6 C) 98.2 F (36.8 C) 98.5 F (36.9 C)  TempSrc:  Oral Oral Oral  SpO2: 96% 97% 97% 97%  Weight:      Height:       General.  Frail elderly man, in no acute distress.  Left eye droop Pulmonary.  Lungs clear bilaterally, normal respiratory effort. CV.  Regular rate and rhythm, no JVD, rub or murmur. Abdomen.  Soft, nontender, nondistended, BS positive. CNS.   Alert and oriented .  No focal neurologic deficit. Extremities.  No edema, no cyanosis, pulses intact and symmetrical. Psychiatry.  Judgment and insight appears normal.  Data Reviewed: Prior data reviewed  Family Communication:   Disposition: Status is: Inpatient Remains inpatient appropriate because: Severity of illness.   Planned Discharge Destination: Skilled nursing facility  Time spent: 50 minutes  This record has been created using Systems analyst. Errors have been sought and corrected,but may not always be located. Such creation errors do not reflect on the standard of care.  Author: Lorella Nimrod, MD 08/01/2022 3:40 PM  For on call review [email or web address].

## 2022-08-02 ENCOUNTER — Other Ambulatory Visit: Payer: Self-pay

## 2022-08-02 ENCOUNTER — Ambulatory Visit: Payer: Medicare Other

## 2022-08-02 DIAGNOSIS — I619 Nontraumatic intracerebral hemorrhage, unspecified: Secondary | ICD-10-CM | POA: Diagnosis not present

## 2022-08-02 LAB — RAD ONC ARIA SESSION SUMMARY
Course Elapsed Days: 1
Plan Fractions Treated to Date: 2
Plan Prescribed Dose Per Fraction: 6 Gy
Plan Total Fractions Prescribed: 5
Plan Total Prescribed Dose: 30 Gy
Reference Point Dosage Given to Date: 12 Gy
Reference Point Session Dosage Given: 6 Gy
Session Number: 2

## 2022-08-02 LAB — GLUCOSE, CAPILLARY
Glucose-Capillary: 151 mg/dL — ABNORMAL HIGH (ref 70–99)
Glucose-Capillary: 214 mg/dL — ABNORMAL HIGH (ref 70–99)

## 2022-08-02 MED ORDER — COLCHICINE 0.6 MG PO TABS: 0.6000 mg | ORAL_TABLET | ORAL | 1 refills | Status: DC | PRN

## 2022-08-02 MED ORDER — ONDANSETRON HCL 4 MG PO TABS: 4.0000 mg | ORAL_TABLET | Freq: Four times a day (QID) | ORAL | 0 refills | Status: DC | PRN

## 2022-08-02 NOTE — Discharge Summary (Signed)
Physician Discharge Summary   Patient: Tony Huffman MRN: [id number] DOB: 12/20/1945  Admit date:     07/29/2022  Discharge date: 08/02/22  Discharge Physician: Lorella Nimrod   PCP: Tonia Ghent, MD   Recommendations at discharge:  Continue with current planned palliative radiation Follow-up with radiation oncology and oncology for further recommendations.  Discharge Diagnoses: Principal Problem:   Intraparenchymal hemorrhage of brain ([place]) Active Problems:   Renal cell carcinoma, left (HCC)   Addisons disease (HCC)   Atrial fibrillation and flutter (HCC)   Hypothyroidism (acquired)   Diabetes mellitus without complication [facility])   Hospital Course: Taken from prior notes.  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 Patient now has recurrence of his disease with mets to the lungs, left nephrectomy site and pancreas. Patient declined repeat biopsy due to previous unsuccessful biopsies at outside facilities.  He has been on hospice but is currently on palliative care. His other PMH includes, hypothyroidism, hypertension, obstructive sleep apnea, adrenal insufficiency and diabetes mellitus. He presented to the ED for left upper extremity weakness which he noted when he woke up in the morning.  He fell on the morning of his admission while attempting to ambulate because he was unable to hold onto his rolling walker.  His symptoms improved after he arrived in the ED. CT head showed acute intraparenchymal hemorrhage in the superior medial left parietal lobe.  Suspect this to be hemorrhagic metastatic lesion given history of metastatic RCC/Pancreatic. Neurosurgery was consulted recommended MRI.  Oncology and radiation oncology consulted.  Patient is started on radiation therapy, not a surgical candidate.  Palliative care and hospice services were  also consulted.  Family would like to take him back to [facility] with hospice.  Hospice will resume care on 11/7 after completing current radiation therapy.  Medically stable to go back to his facility if they can provide transportation for daily radiation until 11/7.  Patient currently medically stable, very guarded prognosis with disease progression.  He will continue with palliative radiation until 11/7 and hospice will take over after that. Patient received todays radiation before discharge.  Will need transportation from tomorrow.  He will continue on current medications and follow-up with his providers for further recommendations.  Assessment and Plan: * Intraparenchymal hemorrhage of brain [facility]) Patient presents to the ER for evaluation of left upper extremity weakness noted upon awakening on the morning of his admission. CT scan of the head without contrast shows 2.7 x 1.4 x 2.1 cm focus of acute parenchymal hemorrhage in the superior, medial left parietal lobe. Suspect this to be a hemorrhagic metastatic lesion given the history of metastatic pancreatic carcinoma. MRI brain : 2.6 cm enhancing mass lesion in the medial left parietal lobe associated with the acute hemorrhage is compatible with a renal cell carcinoma metastasis. Neurosurgery consulted, patient is not interested in any neurosurgical intervention. Oncology and radiation oncology consulted. Patient was started on radiation therapy today. Patient will be going under hospice services at his facility starting from 11/7 after completing current course of radiation.  Renal cell carcinoma, left (HCC) Patient has a known history of metastatic left renal cell carcinoma and is status post left nephrectomy and bilateral adrenalectomy with new metastatic disease to the pancreas, lungs and nephrectomy bed. Oncology consult  Diabetes mellitus without complication (HCC) Hold metformin -SSI  Hypothyroidism (acquired) Continue  Synthroid  Atrial fibrillation  and flutter ([place]) History of paroxysmal atrial fibrillation. Rate well controlled.  Not on any anticoagulation  Addisons disease ([place]) Continue Florinef and hydrocortisone   Consultants: Oncology, radiation oncology, palliative care, neurosurgery Procedures performed: Palliative radiation to brain Disposition: Assisted living Diet recommendation:  Discharge Diet Orders (From admission, onward)     Start     Ordered   08/02/22 0000  Diet - low sodium heart healthy        08/02/22 1140           Regular diet DISCHARGE MEDICATION: Allergies as of 08/02/2022       Reactions   Other Rash   Blisters with bandaids Blisters with bandaids   Tegaderm Ag Mesh [silver]    Tape Rash   Blisters, rash. Paper tape OK. Blisters, rash. Paper tape OK.        Medication List     STOP taking these medications    cyclobenzaprine 10 MG tablet Commonly known as: FLEXERIL       TAKE these medications    Accu-Chek Aviva Plus test strip Generic drug: glucose blood Use to test blood sugar once daily. Dx: E11.49   Accu-Chek Softclix Lancets lancets Use to check blood sugar once daily. Dx: E11.49   Cholecalciferol 125 MCG (5000 UT) capsule Take 5,000 Units by mouth daily.   colchicine 0.6 MG tablet Take 1 tablet (0.6 mg total) by mouth as needed. First sign of Gout Flare. Take 2 tablets ( 1.'2mg'$ ) by mouth at first sign of gout flare followed by 1 tablet ( 0.'6mg'$ ) after 1 hour. ( max 1.'8mg'$  within 1 hours)   cyanocobalamin 1000 MCG tablet Take 1,000 mcg by mouth every other day.   fludrocortisone 0.1 MG tablet Commonly known as: FLORINEF Take 0.05-0.1 mg by mouth daily. (Alternate dose every other day)   gabapentin 600 MG tablet Commonly known as: NEURONTIN Take 900 mg by mouth at bedtime.   hydrocortisone 10 MG tablet Commonly known as: CORTEF Take 5-15 mg by mouth See admin instructions. Take 1 tablets ('15mg'$ ) by mouth every morning,   tablet ('5mg'$ ) by mouth daily at lunchtime and take  tablet ('5mg'$ ) by mouth every evening at dinner   levothyroxine 75 MCG tablet Commonly known as: SYNTHROID Take 75 mcg by mouth daily.   metFORMIN 500 MG tablet Commonly known as: GLUCOPHAGE Take 500 mg by mouth daily.   mirabegron ER 50 MG Tb24 tablet Commonly known as: MYRBETRIQ Take 1 tablet (50 mg total) by mouth daily.   ondansetron 4 MG tablet Commonly known as: ZOFRAN Take 1 tablet (4 mg total) by mouth every 6 (six) hours as needed for nausea.   PRESERVISION AREDS 2 PO Take 1 capsule by mouth in the morning and at bedtime.   PSYLLIUM HUSK PO Take 3.4 g by mouth every evening.        Contact information for follow-up providers     Tonia Ghent, MD. Schedule an appointment as soon as possible for a visit in 1 week(s).   Specialty: Family Medicine Contact information: [place] [address] [phone]              Contact information for after-discharge care     Destination     [facility] .   Service: Skilled Nursing Contact information: [place] [phone]                    Discharge  Exam: Filed Weights   07/29/22 0912  Weight: 90.7 kg   General.     In no acute distress. Pulmonary.  Lungs clear bilaterally, normal respiratory effort. CV.  Regular rate and rhythm, no JVD, rub or murmur. Abdomen.  Soft, nontender, nondistended, BS positive. CNS.  Alert and oriented .  No focal neurologic deficit. Extremities.  No edema, no cyanosis, pulses intact and symmetrical. Psychiatry.  Judgment and insight appears normal.   Condition at discharge: stable  The results of significant diagnostics from this hospitalization (including imaging, microbiology, ancillary and laboratory) are listed below for reference.   Imaging Studies: MR BRAIN W WO CONTRAST  Result Date: 07/29/2022 CLINICAL DATA:   Intracranial hemorrhage. Known renal cell carcinoma. Pancreatic mass. EXAM: MRI HEAD WITHOUT AND WITH CONTRAST TECHNIQUE: Multiplanar, multiecho pulse sequences of the brain and surrounding structures were obtained without and with intravenous contrast. CONTRAST:  12m GADAVIST GADOBUTROL 1 MMOL/ML IV SOLN COMPARISON:  CT head without contrast 07/29/2022. MR head without contrast six/eighteen/twenty two FINDINGS: Brain: A 2.6 x 1.5 x 1.5 cm enhancing mass lesion in the left medial left parietal lobe is associated with the area of acute hemorrhage. No other enhancing lesions or focal hemorrhage is evident. No other foci of susceptibility are present. Mild edema surrounds the acute hemorrhage. Advanced atrophy is again noted. The ventricles are proportionate to the degree of atrophy. No significant extraaxial fluid collection is present. Vascular: Flow is present in the major intracranial arteries. Skull and upper cervical spine: The craniocervical junction is normal. Upper cervical spine is within normal limits. Marrow signal is unremarkable. Sinuses/Orbits: The paranasal sinuses and mastoid air cells are clear. Bilateral lens replacements are noted. Globes and orbits are otherwise unremarkable. IMPRESSION: 1. 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 2. No additional metastases present. 3. Stable moderate generalized atrophy. Electronically Signed   By: CSan MorelleM.D.   On: 07/29/2022 15:27   CT HEAD WO CONTRAST  Result Date: 07/29/2022 CLINICAL DATA:  Left-sided weakness. History of pancreatic carcinoma. Patient awoke this morning with left-sided weakness and left arm drift. EXAM: CT HEAD WITHOUT CONTRAST TECHNIQUE: Contiguous axial images were obtained from the base of the skull through the vertex without intravenous contrast. RADIATION DOSE REDUCTION: This exam was performed according to  the departmental dose-optimization program which includes automated exposure control, adjustment of the mA and/or kV according to patient size and/or use of iterative reconstruction technique. COMPARISON:  Head CT, 01/20/2021. Brain MRI, 03/18/2021. PET-CT, 07/05/2022. FINDINGS: Brain: Acute focal parenchymal hemorrhage, superomedial left parietal lobe, measuring 2.7 x 1.4 x 2.1 cm, with mild surrounding vasogenic edema, but no significant mass effect. No other intracranial hemorrhage. No evidence of acute infarction. No defined mass or significant mass effect. No extra-axial masses or abnormal fluid collections. There is ventricular greater than sulcal enlargement consistent with atrophy. No hydrocephalus. Vascular: No hyperdense vessel or unexpected calcification. Skull: Normal. Negative for fracture or focal lesion. Sinuses/Orbits: Globes and orbits are unremarkable. Sinuses are essentially clear. Other: None. IMPRESSION: 1. 2.7 x 1.4 x 2.1 cm focus of acute parenchymal hemorrhage in the superior, medial left parietal lobe. Suspect this to be a hemorrhagic metastatic lesion given the history of metastatic pancreatic carcinoma. Consider follow-up brain MRI without and with contrast for further assessment. Critical Value/emergent results were called by telephone at the time of interpretation on 07/29/2022 at 10:01 am to [facility],  who verbally acknowledged these results. 2. No other acute abnormality. No other areas of intracranial hemorrhage. No acute infarction. Electronically Signed   By: Lajean Manes M.D.   On: 07/29/2022 10:01   NM PET Image Initial (PI) Skull Base To Thigh  Result Date: 07/06/2022 CLINICAL DATA:  Initial treatment strategy for pancreatic malignancy. EXAM: NUCLEAR MEDICINE PET SKULL BASE TO THIGH TECHNIQUE: 11.81 mCi F-18 FDG was injected intravenously. Full-ring PET imaging was performed from the skull base to thigh after the radiotracer. CT data was obtained and used for  attenuation correction and anatomic localization. Fasting blood glucose: 145 mg/dl COMPARISON:  CT abdomen dated June 11, 2022 FINDINGS: Mediastinal blood pool activity: SUV max 2.0 Liver activity: SUV max 2.7 NECK: No hypermetabolic lymph nodes in the neck. Incidental CT findings: None. CHEST: Enlarged and hypermetabolic left hilar lymph node measuring 2.6 cm in short axis on series 2, image 110 with SUV max of 4.9. bilateral hypermetabolic pulmonary nodules, reference nodule of the right lower lobe measuring 0.8 x 1.4 cm on series 2, image 115 with SUV max of 2.6, not included in the field of view on prior exam. Reference nodule of the left lower lobe measuring 2.0 x 1.6 cm on series 2, image 122 with SUV max of 3.6, unchanged in size when compared with prior. Incidental CT findings: Moderate atherosclerotic disease of the LAD. Normal caliber thoracic aorta with mild calcified plaque. ABDOMEN/PELVIS: Hypermetabolic uptake centered at the wall of the descending duodenum and pancreatic head with soft tissue lesion measuring approximately 2.9 x 1.6 cm on PET-CT, better seen on prior contrast-enhanced abdominal CT, SUV max of 3.6. Hypermetabolic soft tissue nodule at the left nephrectomy site measuring 2.6 x 2.7 cm on series 2, image 52 an SUV max of 3.3, unchanged in size when compared with prior CT of the abdomen. Incidental CT findings: Status post left nephrectomy and bilateral adrenalectomies. Normal caliber abdominal aorta with mild atherosclerotic disease. SKELETON: No focal hypermetabolic activity to suggest skeletal metastasis. Incidental CT findings: None. IMPRESSION: 1. Hypermetabolic soft tissue lesion centered at the wall of the descending duodenum and pancreatic head, differential considerations include primary malignancy such as duodenal/ampullary or pancreatic, metastatic renal cell carcinoma is also a consideration. 2. Hypermetabolic soft tissue nodule at the left nephrectomy site, concerning for  recurrent RCC. 3. Numerous bilateral hypermetabolic pulmonary nodules, consistent with metastatic disease. 4. Enlarged hypermetabolic left hilar lymph node, consistent with metastatic disease. 5. Status post left nephrectomy and bilateral adrenalectomies. 6. Aortic Atherosclerosis (ICD10-I70.0). Electronically Signed   By: Yetta Glassman M.D.   On: 07/06/2022 16:08    Microbiology: Results for orders placed or performed during the hospital encounter of 09/07/19  SARS CORONAVIRUS 2 (TAT 6-24 HRS) Nasopharyngeal Nasopharyngeal Swab     Status: None   Collection Time: 09/07/19 12:44 PM   Specimen: Nasopharyngeal Swab  Result Value Ref Range Status   SARS Coronavirus 2 NEGATIVE NEGATIVE Final    Comment: (NOTE) SARS-CoV-2 target nucleic acids are NOT DETECTED. The SARS-CoV-2 RNA is generally detectable in upper and lower respiratory specimens during the acute phase of infection. Negative results do not preclude SARS-CoV-2 infection, do not rule out co-infections with other pathogens, and should not be used as the sole basis for treatment or other patient management decisions. Negative results must be combined with clinical observations, patient history, and epidemiological information. The expected result is Negative. Fact Sheet for Patients: SugarRoll.be Fact Sheet for Healthcare Providers: https://www.woods-mathews.com/ This test is not yet approved or cleared  by the [place] and  has been authorized for detection and/or diagnosis of SARS-CoV-2 by FDA under an Emergency Use Authorization (EUA). This EUA will remain  in effect (meaning this test can be used) for the duration of the COVID-19 declaration under Section 56 4(b)(1) of the Act, 21 U.S.C. section 360bbb-3(b)(1), unless the authorization is terminated or revoked sooner. Performed at [facility], [place] [address] [place], [place] [address]     Labs: CBC: Recent Labs   Lab 07/29/22 0905  WBC 6.4  NEUTROABS 3.6  HGB 14.1  HCT 43.1  MCV 82.1  PLT 604   Basic Metabolic Panel: Recent Labs  Lab 07/29/22 0905  NA 137  K 4.2  CL 103  CO2 25  GLUCOSE 164*  BUN 24*  CREATININE 1.02  CALCIUM 9.3   Liver Function Tests: Recent Labs  Lab 07/29/22 0905  AST 21  ALT 23  ALKPHOS 86  BILITOT 0.9  PROT 7.2  ALBUMIN 3.9   CBG: Recent Labs  Lab 08/01/22 1207 08/01/22 1547 08/01/22 2107 08/02/22 0715 08/02/22 1139  GLUCAP 187* 325* 259* 151* 214*    Discharge time spent: greater than 30 minutes.  This record has been created using Systems analyst. Errors have been sought and corrected,but may not always be located. Such creation errors do not reflect on the standard of care.   Signed: Lorella Nimrod, MD [facility] 08/02/2022

## 2022-08-02 NOTE — TOC Transition Note (Signed)
Transition of Care [facility]) - CM/SW Discharge Note   Patient Details  Name: Antoney Biven MRN: [id number] Date of Birth: July 21, 1946  [facility] of [facility]) CM/SW Contact:  Candie Chroman, LCSW Phone Number: 08/02/2022, 12:03 PM   Clinical Narrative:   Patient has orders to discharge to [facility] today. RN will call report to [phone] (Room 107). Wife will transport. SNF is ready for him whenever he arrives. No further concerns. CSW signing off.  Final next level of care: [place] Barriers to Discharge: Barriers Resolved   Patient Goals and CMS Choice     Choice offered to / list presented to : Patient, Spouse  Discharge Placement PASRR number recieved: 07/30/22            Patient chooses bed at: [facility] Patient to be transferred to facility by: Wife Name of family member notified: Leonidas Boateng Patient and family notified of of transfer: 08/02/22  Discharge Plan and Services     Post Acute Care Choice: [place]                               Social Determinants of Health (SDOH) Interventions     Readmission Risk Interventions     No data to display

## 2022-08-02 NOTE — Progress Notes (Signed)
OT Cancellation Note  Patient Details Name: Tony Huffman MRN: [id number] DOB: Aug 02, 1946   Cancelled Treatment:    Reason Eval/Treat Not Completed: Patient at procedure or test/ unavailable. Pt currently off the unit for radiation. OT will re-attempt when pt is next available.   Darleen Crocker, MS, OTR/L , CBIS ascom [phone]  08/02/22, 10:15 AM

## 2022-08-02 NOTE — Progress Notes (Signed)
Speech Language Pathology Treatment: Cognitive-Linquistic  Patient Details Name: Tony Huffman MRN: [id number] DOB: [birth year] Today's Date: 08/02/2022 Time: 4718-5501 SLP Time Calculation (min) (ACUTE ONLY): 15 min  Assessment / Plan / Recommendation Clinical Impression  Pt seen for follow up speech therapy intervention targeting education/rationale for speech therapy in rehab and identification of potential goals/compensatory strategies. Pt and spouse deny new concerns in the setting of initial radiation treatments. Therapist reinforced use of external aids and spousal support for recall of upcoming appointments. Both reported understanding and willingness for SLP follow up at next level of care. Acute goals met and education completed- therefore, no further acute services indicated. Recommend follow up speech therapy at discharge.    HPI HPI: Tony Huffman is a 76 y.o. 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  Patient now has recurrence of his disease with mets to the lungs, left nephrectomy site and pancreas.  Patient declined repeat biopsy due to previous unsuccessful biopsies at outside facilities.  He has been on hospice but is currently on palliative care. He also has past medical history significant for hypothyroidism, hypertension, obstructive sleep apnea, adrenal insufficiency and diabetes mellitus. He presented to the ER by EMS for evaluation of left upper extremity weakness which he noted when he woke up at about 7:45 AM.  Last known well was about 10 PM last night and patient woke up with left upper extremity weakness.  He fell on the morning of his admission while attempting to ambulate because he was unable to hold onto his rolling walker.  At baseline he uses an assist device and has some left leg weakness. MRI 07/29/22: 2.6 cm enhancing mass lesion in  the medial left parietal lobe associated with the acute hemorrhage is compatible with a renal cell carcinoma metastasis. No additional metastases present. Stable moderate generalized atrophy.      SLP Plan  All goals met      Recommendations for follow up therapy are one component of a multi-disciplinary discharge planning process, led by the attending physician.  Recommendations may be updated based on patient status, additional functional criteria and insurance authorization.    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

## 2022-08-02 NOTE — Progress Notes (Signed)
PT Cancellation Note  Patient Details Name: Tony Huffman MRN: [id number] DOB: August 04, 1946   Cancelled Treatment:     Pt off floor for radiation. Per spouse, planning to DC back to [facility] later this date. Acute PT will continue to follow up until DC.   Willette Pa 08/02/2022, 9:18 AM

## 2022-08-02 NOTE — Progress Notes (Signed)
Daily Progress Note   Patient Name: Tony Huffman       Date: 08/02/2022 DOB: 1946/08/18  Age: 76 y.o. MRN#: [id number] Attending Physician: No att. providers found Primary Care Physician: Tonia Ghent, MD Admit Date: 07/29/2022 Length of Stay: 4 days  Reason for Consultation/Follow-up: Establishing goals of care  HPI/Patient Profile:  76 y.o. 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 Patient now has recurrence of his disease with mets to the lungs, left nephrectomy site and pancreas. Patient declined repeat biopsy due to previous unsuccessful biopsies at outside facilities.  He has been on hospice but is currently on palliative care. His other PMH includes, hypothyroidism, hypertension, obstructive sleep apnea, adrenal insufficiency and diabetes mellitus. He presented to the ED for left upper extremity weakness which he noted when he woke up in the morning.  He fell on the morning of his admission while attempting to ambulate because he was unable to hold onto his rolling walker.  His symptoms improved after he arrived in the ED. CT head showed acute intraparenchymal hemorrhage in the superior medial left parietal lobe.  Suspect this to be hemorrhagic metastatic lesion given history of metastatic RCC/Pancreatic. Neurosurgery was consulted recommended MRI.  Oncology and radiation oncology consulted.  Patient is started on radiation therapy, not a surgical candidate.  He was admitted on 07/29/2022 with cerebral parenchymal hemorrhage, RCC, and others.    PMT was consulted for goals of care conversations.  Subjective:   Subjective: Chart Reviewed. Updates received. Patient Assessed. Created space and opportunity for patient  and family to explore thoughts and feelings regarding current medical situation.  Today's Discussion: Today met with the patient and his  wife at the bedside.  His first radiation treatment was today and it went okay.  No obvious skin redness.  He is coping well thus far.  We had a lengthy discussion allowing him to explore his thoughts and feelings along with his wife.  We shared aspects of joy in his life and how he can continue these.  The goal of palliative care is to promote quality of life.  He will be following up with radiation oncology for ongoing palliative radiation.  Plans to enroll in hospice after radiation is complete, [facility] currently following.  I provided emotional and general support through therapeutic listening, empathy, sharing of stories, and other techniques. I answered all questions and addressed all concerns to the best of my ability.  Review of Systems  Constitutional:  Negative for fatigue.  Respiratory:  Negative for shortness of breath.   Gastrointestinal:  Negative for abdominal pain, nausea and vomiting.    Objective:   Vital Signs:  BP 122/74 (BP Location: Left Arm)   Pulse 80   Temp 98.1 F (36.7 C) (Oral)   Resp 16   Ht _0  (1.727 m)   Wt 90.7 kg   SpO2 95%   BMI 30.41 kg/m   Physical Exam: Physical Exam Vitals and nursing note reviewed.  Constitutional:      General: He is not in acute distress. HENT:     Head: Normocephalic and atraumatic.  Cardiovascular:     Rate and Rhythm: Normal rate.  Pulmonary:     Effort: Pulmonary effort is normal. No respiratory distress.  Abdominal:     General: Abdomen is flat.     Palpations: Abdomen is soft.  Skin:  General: Skin is warm and dry.  Neurological:     General: No focal deficit present.     Mental Status: He is alert.  Psychiatric:        Mood and Affect: Mood normal.        Behavior: Behavior normal.     Palliative Assessment/Data: 40%    Existing Vynca/ACP Documentation: MOST form dated 05/28/2018 DNR dated 05/28/2018 (no expiration)  Assessment & Plan:   Impression: Present on Admission:  Renal cell carcinoma,  left (HCC)  Hypothyroidism (acquired)  Addisons disease ([place])  Atrial fibrillation and flutter ([place])  76 year old male with a history of RCC metastasis to the pancreas now with a metastatic lesion in the brain which is hemorrhagic.  Not a surgical candidate, and is declining other treatment.  He has excepted palliative radiation plans to begin in the next day or 2.  It is anticipated he will complete a course of 5 total treatments.  I have spoken with hospice and they expressed ability to accept him for hospice services at his current [facility] facility after palliative radiation is complete.  Overall prognosis is poor.   SUMMARY OF RECOMMENDATIONS   Continue emotional support patient and family Plans for radiation treatments for palliative intent as planned [facility] hospice aware of the patient, will enroll after radiation PMT will continue to follow  Symptom Management:  Per primary team PMT is available to assist as needed  Code Status: DNR  Prognosis: < 6 months  Discharge Planning:  Back to [place]/AL[place] home with hospice in place  Discussed with: Patient, family, nursing team, medical team  Thank you for allowing [place] to participate in the care of [place] PMT will continue to support holistically.  Billing based on MDM: High  Problems Addressed: One acute or chronic illness or injury that poses a threat to life or bodily function  Amount and/or Complexity of Data: Category 3:Discussion of management or test interpretation with external physician/other qualified health care professional/appropriate source (not separately reported)  Risks: Decision not to resuscitate or to de-escalate care because of poor prognosis (Confirmed DNR and desire for hospice after palliative radiation)  Walden Field, NP Palliative Medicine Team  Team Phone # [phone] (Nights/Weekends)  05/30/2021, 8:17 AM

## 2022-08-02 NOTE — Care Management Important Message (Signed)
Important Message  Patient Details  Name: Bray Vickerman MRN: [id number] Date of Birth: 03-11-1946   Medicare Important Message Given:  Yes     Dannette Barbara 08/02/2022, 11:41 AM

## 2022-08-02 NOTE — Inpatient Diabetes Management (Signed)
Inpatient Diabetes Program Recommendations  AACE/ADA: New Consensus Statement on Inpatient Glycemic Control (2015)  Target Ranges:  Prepandial:   less than 140 mg/dL      Peak postprandial:   less than 180 mg/dL (1-2 hours)      Critically ill patients:  140 - 180 mg/dL   Lab Results  Component Value Date   GLUCAP 151 (H) 08/02/2022   HGBA1C 7.2 (H) 07/29/2022    Review of Glycemic Control  Latest Reference Range & Units 08/01/22 07:55 08/01/22 12:07 08/01/22 15:47 08/01/22 21:07 08/02/22 07:15  Glucose-Capillary 70 - 99 mg/dL 142 (H) 187 (H) 325 (H) 259 (H) 151 (H)  (H): Data is abnormally high  Diabetes history: DM2 Outpatient Diabetes medications: Metformin 500 mg QD Current orders for Inpatient glycemic control: Novolog 0-15 units TID Cortef 15 mg QAM & 5 mg with lunch and dinner   Inpatient Diabetes Program Recommendations:    Postprandial glucose elevated.  Please consider:  Novolog 3 units TID with meals if consumes at least 50%.  Will continue to follow while inpatient.  Thank you, Reche Dixon, MSN, [place] Diabetes Coordinator Inpatient Diabetes Program [phone] (team pager from 8a-5p)

## 2022-08-03 ENCOUNTER — Ambulatory Visit: Payer: Medicare Other

## 2022-08-03 ENCOUNTER — Non-Acute Institutional Stay (SKILLED_NURSING_FACILITY): Payer: Medicare Other | Admitting: Student

## 2022-08-03 ENCOUNTER — Encounter: Payer: Self-pay | Admitting: Student

## 2022-08-03 DIAGNOSIS — E271 Primary adrenocortical insufficiency: Secondary | ICD-10-CM

## 2022-08-03 DIAGNOSIS — I619 Nontraumatic intracerebral hemorrhage, unspecified: Secondary | ICD-10-CM | POA: Diagnosis not present

## 2022-08-03 DIAGNOSIS — I4891 Unspecified atrial fibrillation: Secondary | ICD-10-CM

## 2022-08-03 DIAGNOSIS — C7802 Secondary malignant neoplasm of left lung: Secondary | ICD-10-CM | POA: Diagnosis not present

## 2022-08-03 DIAGNOSIS — E039 Hypothyroidism, unspecified: Secondary | ICD-10-CM

## 2022-08-03 DIAGNOSIS — R350 Frequency of micturition: Secondary | ICD-10-CM

## 2022-08-03 DIAGNOSIS — E119 Type 2 diabetes mellitus without complications: Secondary | ICD-10-CM

## 2022-08-03 DIAGNOSIS — C642 Malignant neoplasm of left kidney, except renal pelvis: Secondary | ICD-10-CM | POA: Diagnosis not present

## 2022-08-03 DIAGNOSIS — K8689 Other specified diseases of pancreas: Secondary | ICD-10-CM | POA: Diagnosis not present

## 2022-08-03 DIAGNOSIS — I4892 Unspecified atrial flutter: Secondary | ICD-10-CM

## 2022-08-03 DIAGNOSIS — C7490 Malignant neoplasm of unspecified part of unspecified adrenal gland: Secondary | ICD-10-CM

## 2022-08-03 DIAGNOSIS — E1149 Type 2 diabetes mellitus with other diabetic neurological complication: Secondary | ICD-10-CM

## 2022-08-03 DIAGNOSIS — N401 Enlarged prostate with lower urinary tract symptoms: Secondary | ICD-10-CM | POA: Insufficient documentation

## 2022-08-03 DIAGNOSIS — M109 Gout, unspecified: Secondary | ICD-10-CM

## 2022-08-03 DIAGNOSIS — N1831 Chronic kidney disease, stage 3a: Secondary | ICD-10-CM

## 2022-08-03 NOTE — Progress Notes (Signed)
Provider:  Dr. Dewayne Shorter Location:  Other [place] Room Number: [place]:  SNF ([phone])  PCP: Tonia Ghent, MD Patient Care Team: Tonia Ghent, MD as PCP - General (Family Medicine) [name], MD as Physician Assistant (Endocrinology) [name], MD as Referring Physician (Neurology)  Extended Emergency Contact Information Primary Emergency Contact: Leshon, Armistead Mobile Phone: [phone] Relation: Spouse  Code Status: DNR Goals of Care: Advanced Directive information    07/30/2022    1:00 AM  Advanced Directives  Does patient want to make changes to medical advance directive? No - Patient declined      Chief Complaint  Patient presents with   Admission    Admission    HPI: Patient is a 76 y.o. male seen today for admission to [facility].  He is from [place] she is from [place] and travelled a lot in between. He spent a lot of time in [place]. He was  a Scientist, research (physical sciences) at [employer]. They have been in [place] since 2020.   He went to the hospital because he lost feeling in his left arm. He has had radiation and has three more treatments before he is done.   He is purely palliative. Cancer of pancreas is back and growing - duodenum, lungs, and brain.   Today he is not feeling so good. His stomach was upset.   He just wants to enjoy the remaining days he has. He did the chemo route for a while in 2011 and he felt terrible. Caused neurological damage - balance change.   Wife spoke with endocrinology - 5 days of blood sugars.   He had urinary urgency. PVR was normal.   Pain - left foot: he has cramps in his feet a lot pain in top of his foot.   Cancer is in his brain. He prefers palliative care. He was on Hospice 2 years before. He had a fall Sunday and after vaccine - 2 this year. In PT for 1 yr. Goal to return home. Ind Urinination. Bathes himself. Recent difficulty with socks and shoes. Feeds  himself. He manages his medications a well.   Past Medical History:  Diagnosis Date   Adrenal insufficiency (HCC)    Anxiety    Chronic kidney disease, stage 3 (HCC) 11/13/2011   Erectile dysfunction    GERD (gastroesophageal reflux disease)    Gout 09/04/2013   H/O atrial flutter 03/07/2017   Hyperlipidemia 06/16/2014   Hypertension    Hypothyroidism (acquired) 04/14/2014   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[place]) 11/10/2010   OSA (obstructive sleep apnea) 07/24/2016   Pancreatic mass 08/01/2018   Renal carcinoma ([place]) 06/16/2015   RLS (restless legs syndrome) 07/24/2016   Sleep apnea    Testicular hypofunction    Tussive syncopes 10/15/2013   Type 2 diabetes mellitus ([place])    Vitamin D deficiency disease    Past Surgical History:  Procedure Laterality Date   ADRENALECTOMY     CATARACT EXTRACTION  10/2014   CATARACT EXTRACTION EXTRACAPSULAR  11/05/2016   with Insertion Intraocular Prostheis.    COLONOSCOPY  05/06/2017   with removal lesions by snare.   CYSTECTOMY     Upper Neck/Chest Area, [facility]   NEPHRECTOMY     POPLITEAL SYNOVIAL CYST EXCISION     SPINE SURGERY  1989   THYROIDECTOMY     patient states they only removed half   TONSILLECTOMY      reports that he  quit smoking about 45 years ago. His smoking use included cigarettes. He has a 10.00 pack-year smoking history. He has been exposed to tobacco smoke. He has never used smokeless tobacco. He reports that he does not currently use alcohol. He reports that he does not use drugs. Social History   Socioeconomic History   Marital status: Married    Spouse name: Not on file   Number of children: Not on file   Years of education: Not on file   Highest education level: Not on file  Occupational History   Not on file  Tobacco Use   Smoking status: Former    Packs/day: 1.00    Years: 10.00    Total pack years: 10.00    Types: Cigarettes    Quit date: 39    Years since quitting: 45.8     Passive exposure: Past   Smokeless tobacco: Never  Vaping Use   Vaping Use: Never used  Substance and Sexual Activity   Alcohol use: Not Currently   Drug use: Never   Sexual activity: Not Currently  Other Topics Concern   Not on file  Social History Narrative   Married 2002.   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   Lives at [facility].   Social Determinants of Health   Financial Resource Strain: Low Risk  (07/30/2022)   Overall Financial Resource Strain (CARDIA)    Difficulty of Paying Living Expenses: Not hard at all  Food Insecurity: No Food Insecurity (07/30/2022)   Hunger Vital Sign    Worried About Running Out of Food in the Last Year: Never true    Ran Out of Food in the Last Year: Never true  Transportation Needs: No Transportation Needs (07/30/2022)   PRAPARE - Hydrologist (Medical): No    Lack of Transportation (Non-Medical): No  Physical Activity: Inactive (07/30/2022)   Exercise Vital Sign    Days of Exercise per Week: 0 days    Minutes of Exercise per Session: 0 min  Stress: Stress Concern Present (07/30/2022)   [place]    Feeling of Stress : To some extent  Social Connections: Socially Integrated (07/30/2022)   Social Connection and Isolation Panel [NHANES]    Frequency of Communication with Friends and Family: Twice a week    Frequency of Social Gatherings with Friends and Family: Twice a week    Attends Religious Services: 1 to 4 times per year    Active Member of [employer] or Organizations: Yes    Attends Archivist Meetings: 1 to 4 times per year    Marital Status: Married  Human resources officer Violence: Not At Risk (07/30/2022)   Humiliation, Afraid, Rape, and Kick questionnaire    Fear of Current or Ex-Partner: No    Emotionally Abused: No    Physically Abused: No    Sexually Abused: No    Functional Status Survey:    Family History  Problem  Relation Age of Onset   Ovarian cancer Mother    Cancer Mother    Cancer Father    Heart disease Father    Coronary artery disease Father    Hypertension Father    Macular degeneration Father    Heart disease Brother    Diabetes Brother    Coronary artery disease Brother    Diabetes type II Brother    Coronary artery disease Paternal Grandfather     Health Maintenance  Topic Date  Due   FOOT EXAM  Never done   OPHTHALMOLOGY EXAM  Never done   Diabetic kidney evaluation - Urine ACR  Never done   Hepatitis C Screening  Never done   Zoster Vaccines- Shingrix (1 of 2) Never done   COVID-19 Vaccine (7 - Moderna risk series) 04/03/2022   INFLUENZA VACCINE  05/01/2022   HEMOGLOBIN A1C  01/28/2023   TETANUS/TDAP  06/04/2023   Diabetic kidney evaluation - GFR measurement  07/30/2023   Pneumonia Vaccine 6+ Years old  Completed   HPV VACCINES  Aged Out    Allergies  Allergen Reactions   Other Rash    Blisters with bandaids Blisters with bandaids    Tegaderm Ag Mesh [Silver]    Tape Rash    Blisters, rash. Paper tape OK. Blisters, rash. Paper tape OK.     Outpatient Encounter Medications as of 08/03/2022  Medication Sig   Accu-Chek Softclix Lancets lancets Use to check blood sugar once daily. Dx: E11.49   colchicine 0.6 MG tablet Take 1 tablet (0.6 mg total) by mouth as needed. First sign of Gout Flare. Take 2 tablets ( 1.'2mg'$ ) by mouth at first sign of gout flare followed by 1 tablet ( 0.'6mg'$ ) after 1 hour. ( max 1.'8mg'$  within 1 hours)   cyanocobalamin 1000 MCG tablet Take 1,000 mcg by mouth every other day.   fludrocortisone (FLORINEF) 0.1 MG tablet Take 0.1 mg by mouth daily. (Alternate dose every other day)   gabapentin (NEURONTIN) 600 MG tablet Take 900 mg by mouth at bedtime.   glucose blood (ACCU-CHEK AVIVA PLUS) test strip Use to test blood sugar once daily. Dx: E11.49   hydrocortisone (CORTEF) 10 MG tablet Take 1 tablets ('15mg'$ ) by mouth every morning,  tablet ('5mg'$ ) by  mouth daily at lunchtime and take  tablet ('5mg'$ ) by mouth every evening at dinner   levothyroxine (SYNTHROID) 75 MCG tablet Take 75 mcg by mouth daily.   metFORMIN (GLUCOPHAGE) 500 MG tablet Take 500 mg by mouth daily.   mirabegron ER (MYRBETRIQ) 50 MG TB24 tablet Take 1 tablet (50 mg total) by mouth daily.   Multiple Vitamins-Minerals (PRESERVISION AREDS 2 PO) Take 1 capsule by mouth in the morning and at bedtime.   ondansetron (ZOFRAN) 4 MG tablet Take 1 tablet (4 mg total) by mouth every 6 (six) hours as needed for nausea.   PSYLLIUM HUSK PO Take 3.4 g by mouth every evening.   [DISCONTINUED] Cholecalciferol 125 MCG (5000 UT) capsule Take 5,000 Units by mouth daily.   No facility-administered encounter medications on file as of 08/03/2022.    Review of Systems  All other systems reviewed and are negative.   Vitals:   08/03/22 1018  BP: 121/71  Pulse: 77  Resp: 18  Temp: 97.9 F (36.6 C)  SpO2: 98%  Weight: 207 lb 8 oz (94.1 kg)  Height: '5\' 8"'$  (1.727 m)   Body mass index is 31.55 kg/m. Physical Exam Constitutional:      Appearance: He is obese.  Cardiovascular:     Rate and Rhythm: Normal rate and regular rhythm.     Pulses: Normal pulses.     Heart sounds: Normal heart sounds.  Pulmonary:     Effort: Pulmonary effort is normal.     Breath sounds: Normal breath sounds.  Abdominal:     General: Bowel sounds are normal.     Palpations: Abdomen is soft.  Musculoskeletal:     Comments: Left ankle with swelling, warm to the touch, and  tender to palpation.   Skin:    Capillary Refill: Capillary refill takes less than 2 seconds.  Neurological:     General: No focal deficit present.     Mental Status: He is alert.     Labs reviewed: Basic Metabolic Panel: Recent Labs    01/01/22 0000 06/11/22 1404 06/19/22 1045 07/29/22 0905  NA 136*  --  134* 137  K 5.0  --  4.9 4.2  CL 101  --  103 103  CO2 27*  --  25 25  GLUCOSE  --   --  181* 164*  BUN 21  --  24* 24*   CREATININE 1.1 1.10 0.94 1.02  CALCIUM 9.3  --  8.8* 9.3   Liver Function Tests: Recent Labs    05/07/22 0000 06/19/22 1045 07/29/22 0905  AST '16 25 21  '$ ALT '19 22 23  '$ ALKPHOS 83 82 86  BILITOT  --  0.6 0.9  PROT  --  7.0 7.2  ALBUMIN 4.3 3.9 3.9   No results for input(s): "LIPASE", "AMYLASE" in the last 8760 hours. No results for input(s): "AMMONIA" in the last 8760 hours. CBC: Recent Labs    05/07/22 0000 06/19/22 1045 07/29/22 0905  WBC 7.4 8.0 6.4  NEUTROABS 3,826.00 5.4 3.6  HGB 14.2 13.7 14.1  HCT 43 41.4 43.1  MCV  --  84.3 82.1  PLT 292 264 284   Cardiac Enzymes: No results for input(s): "CKTOTAL", "CKMB", "CKMBINDEX", "TROPONINI" in the last 8760 hours. BNP: Invalid input(s): "POCBNP" Lab Results  Component Value Date   HGBA1C 7.2 (H) 07/29/2022   Lab Results  Component Value Date   TSH 3.18 01/01/2022   Lab Results  Component Value Date   ZOXWRUEA54 098 04/07/2021   No results found for: "FOLATE" No results found for: "IRON", "TIBC", "FERRITIN"  Imaging and Procedures obtained prior to SNF admission: MR BRAIN W WO CONTRAST  Result Date: 07/29/2022 CLINICAL DATA:  Intracranial hemorrhage. Known renal cell carcinoma. Pancreatic mass. EXAM: MRI HEAD WITHOUT AND WITH CONTRAST TECHNIQUE: Multiplanar, multiecho pulse sequences of the brain and surrounding structures were obtained without and with intravenous contrast. CONTRAST:  34m GADAVIST GADOBUTROL 1 MMOL/ML IV SOLN COMPARISON:  CT head without contrast 07/29/2022. MR head without contrast six/eighteen/twenty two FINDINGS: Brain: A 2.6 x 1.5 x 1.5 cm enhancing mass lesion in the left medial left parietal lobe is associated with the area of acute hemorrhage. No other enhancing lesions or focal hemorrhage is evident. No other foci of susceptibility are present. Mild edema surrounds the acute hemorrhage. Advanced atrophy is again noted. The ventricles are proportionate to the degree of atrophy. No  significant extraaxial fluid collection is present. Vascular: Flow is present in the major intracranial arteries. Skull and upper cervical spine: The craniocervical junction is normal. Upper cervical spine is within normal limits. Marrow signal is unremarkable. Sinuses/Orbits: The paranasal sinuses and mastoid air cells are clear. Bilateral lens replacements are noted. Globes and orbits are otherwise unremarkable. IMPRESSION: 1. 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 2. No additional metastases present. 3. Stable moderate generalized atrophy. Electronically Signed   By: CSan MorelleM.D.   On: 07/29/2022 15:27   CT HEAD WO CONTRAST  Result Date: 07/29/2022 CLINICAL DATA:  Left-sided weakness. History of pancreatic carcinoma. Patient awoke this morning with left-sided weakness and left arm drift. EXAM: CT HEAD WITHOUT CONTRAST TECHNIQUE: Contiguous  axial images were obtained from the base of the skull through the vertex without intravenous contrast. RADIATION DOSE REDUCTION: This exam was performed according to the departmental dose-optimization program which includes automated exposure control, adjustment of the mA and/or kV according to patient size and/or use of iterative reconstruction technique. COMPARISON:  Head CT, 01/20/2021. Brain MRI, 03/18/2021. PET-CT, 07/05/2022. FINDINGS: Brain: Acute focal parenchymal hemorrhage, superomedial left parietal lobe, measuring 2.7 x 1.4 x 2.1 cm, with mild surrounding vasogenic edema, but no significant mass effect. No other intracranial hemorrhage. No evidence of acute infarction. No defined mass or significant mass effect. No extra-axial masses or abnormal fluid collections. There is ventricular greater than sulcal enlargement consistent with atrophy. No hydrocephalus. Vascular: No hyperdense vessel or unexpected calcification. Skull: Normal.  Negative for fracture or focal lesion. Sinuses/Orbits: Globes and orbits are unremarkable. Sinuses are essentially clear. Other: None. IMPRESSION: 1. 2.7 x 1.4 x 2.1 cm focus of acute parenchymal hemorrhage in the superior, medial left parietal lobe. Suspect this to be a hemorrhagic metastatic lesion given the history of metastatic pancreatic carcinoma. Consider follow-up brain MRI without and with contrast for further assessment. Critical Value/emergent results were called by telephone at the time of interpretation on 07/29/2022 at 10:01 am to provider [facility] , who verbally acknowledged these results. 2. No other acute abnormality. No other areas of intracranial hemorrhage. No acute infarction. Electronically Signed   By: Lajean Manes M.D.   On: 07/29/2022 10:01    Assessment/Plan 1. 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 Renal cell carcinoma, left (HCC) 3. Pancreatic mass 5. Intraparenchymal hemorrhage of brain ([place]) 7. Malignant neoplasm of adrenal gland, unspecified laterality, unspecified part ([place]) Patient with stage IV left kidney clear-cell carcinoma with metastasis to the adrenal status post adrenalectomy now with pancreatic lesions, mets to the lungs and recently found brain.  CT scan showed acute intraparenchymal hemorrhage in the superior left medial parietal lobe suspect due to metastatic lesion found on MRI.  They plan to continue with 3 additional treatments of radiation, patient is not a surgical candidate and will not resume with chemotherapy.  He states that he would like to proceed with palliative care and measures if he were to become ill.  Maintains that he is a DNR status.  4. Stage 3a chronic kidney disease ([place]) Patient with CKD 3 creatinine 1.02 at baseline most recent GFR greater than 60.  6. 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  On longstanding steroids and has had hyperglycemia  throughout hospitalization.  Endocrinologist requesting next 5 days of glucose results.  Today's fasting glucose was 134.  We will continue metformin at this time, will appreciate endocrinology recommendations regarding patient's glucose management.  Continue gabapentin 900 mg nightly   8. Addisons disease ([place]) Patient with history of Addison's disease on long-term fludrocortisone 0.1 mg daily and Cortef 10 mg daily  9. Hypothyroidism (acquired) Continue Synthroid 75 mcg daily  10. Atrial fibrillation and flutter (HCC) Heart rate well controlled at this time continue to monitor.  11. Gout of left ankle, unspecified cause, unspecified chronicity Patient's left ankle warm, swollen, tender to palpation.  Unclear if patient had a an injury with his most recent fall.  Patient's uric acid chronically elevated, will defer collecting uric acid at this time.  We will plan for x-ray to evaluate for fracture given long-term steroid use and potential weakening of bones.  We will empirically begin colchicine 1.2 mg now and 0.6  mg in 1 hour and then daily for the next 5 days for presumed gout.  14. BPH w/ freqency No signs of urinary retention continue mirabegron 50 mg daily.   Family/ staff Communication: Wife [name] is at bedside nursing updated2  Labs/tests ordered: none  Tomasa Rand, MD, [place] [phone]

## 2022-08-06 ENCOUNTER — Other Ambulatory Visit: Payer: Self-pay

## 2022-08-06 ENCOUNTER — Other Ambulatory Visit: Payer: Self-pay | Admitting: *Deleted

## 2022-08-06 ENCOUNTER — Ambulatory Visit
Admission: RE | Admit: 2022-08-06 | Discharge: 2022-08-06 | Disposition: A | Discharge status: Home / Self Care | Payer: Medicare Other | Source: Ambulatory Visit | Attending: Radiation Oncology | Admitting: Radiation Oncology

## 2022-08-06 ENCOUNTER — Inpatient Hospital Stay: Payer: Medicare Other | Attending: Oncology

## 2022-08-06 DIAGNOSIS — I619 Nontraumatic intracerebral hemorrhage, unspecified: Secondary | ICD-10-CM

## 2022-08-06 DIAGNOSIS — C642 Malignant neoplasm of left kidney, except renal pelvis: Secondary | ICD-10-CM | POA: Insufficient documentation

## 2022-08-06 LAB — CBC
HCT: 40.8 % (ref 39.0–52.0)
Hemoglobin: 13.6 g/dL (ref 13.0–17.0)
MCH: 27.5 pg (ref 26.0–34.0)
MCHC: 33.3 g/dL (ref 30.0–36.0)
MCV: 82.6 fL (ref 80.0–100.0)
Platelets: 296 10*3/uL (ref 150–400)
RBC: 4.94 MIL/uL (ref 4.22–5.81)
RDW: 14.6 % (ref 11.5–15.5)
WBC: 8.9 10*3/uL (ref 4.0–10.5)
nRBC: 0 % (ref 0.0–0.2)

## 2022-08-06 LAB — RAD ONC ARIA SESSION SUMMARY
Course Elapsed Days: 5
Plan Fractions Treated to Date: 3
Plan Prescribed Dose Per Fraction: 6 Gy
Plan Total Fractions Prescribed: 5
Plan Total Prescribed Dose: 30 Gy
Reference Point Dosage Given to Date: 18 Gy
Reference Point Session Dosage Given: 6 Gy
Session Number: 3

## 2022-08-07 ENCOUNTER — Telehealth: Payer: Self-pay | Admitting: Family Medicine

## 2022-08-07 ENCOUNTER — Ambulatory Visit: Payer: Medicare Other

## 2022-08-07 ENCOUNTER — Non-Acute Institutional Stay (SKILLED_NURSING_FACILITY): Payer: Medicare Other | Admitting: Nurse Practitioner

## 2022-08-07 ENCOUNTER — Encounter: Payer: Self-pay | Admitting: Nurse Practitioner

## 2022-08-07 ENCOUNTER — Other Ambulatory Visit: Payer: Self-pay

## 2022-08-07 ENCOUNTER — Ambulatory Visit
Admission: RE | Admit: 2022-08-07 | Discharge: 2022-08-07 | Disposition: A | Discharge status: Home / Self Care | Payer: Medicare Other | Source: Ambulatory Visit | Attending: Radiation Oncology | Admitting: Radiation Oncology

## 2022-08-07 DIAGNOSIS — C7931 Secondary malignant neoplasm of brain: Secondary | ICD-10-CM | POA: Diagnosis not present

## 2022-08-07 DIAGNOSIS — Z66 Do not resuscitate: Secondary | ICD-10-CM

## 2022-08-07 DIAGNOSIS — L089 Local infection of the skin and subcutaneous tissue, unspecified: Secondary | ICD-10-CM | POA: Diagnosis not present

## 2022-08-07 DIAGNOSIS — L729 Follicular cyst of the skin and subcutaneous tissue, unspecified: Secondary | ICD-10-CM

## 2022-08-07 DIAGNOSIS — Z510 Encounter for antineoplastic radiation therapy: Secondary | ICD-10-CM | POA: Diagnosis not present

## 2022-08-07 LAB — RAD ONC ARIA SESSION SUMMARY
Course Elapsed Days: 6
Plan Fractions Treated to Date: 4
Plan Prescribed Dose Per Fraction: 6 Gy
Plan Total Fractions Prescribed: 5
Plan Total Prescribed Dose: 30 Gy
Reference Point Dosage Given to Date: 24 Gy
Reference Point Session Dosage Given: 6 Gy
Session Number: 4

## 2022-08-07 NOTE — Progress Notes (Signed)
Location:  Other [facility]) Nursing Home Room Number: 108-A Place of Service:  SNF [phone]) Provider:  Janene Harvey. Janyth Contes, NP   Patient Care Team: Joaquim Nam, MD as PCP - General (Family Medicine) Tedd Sias, Marlana Salvage, MD as Physician Assistant (Endocrinology) Morene Crocker, MD as Referring Physician (Neurology)  Extended Emergency Contact Information Primary Emergency Contact: Eiljah, Heidenreich Mobile Phone: [phone] Relation: Spouse  Code Status:  DNR Goals of care: Advanced Directive information    08/07/2022    2:07 PM  Advanced Directives  Does Patient Have a Medical Advance Directive? Yes  Type of Advance Directive Out of facility DNR (pink MOST or yellow form)  Does patient want to make changes to medical advance directive? No - Patient declined  Pre-existing out of facility DNR order (yellow form or pink MOST form) Yellow form placed in chart (order not valid for inpatient use);Pink MOST form placed in chart (order not valid for inpatient use)     Chief Complaint  Patient presents with   Acute Visit    Cyst. Vitals and medications are a reflection of [facility]MR system, [employer] [facility]     HPI:  Pt is a 76 y.o. male seen today for an acute visit for painful red cyst to right upper back. Pt is at snf for rehab during radiation treatment. He is palliative care for metastatic cancer.  Patient noticed increase in pain to right upper back when being moved for radiation. Staff noted large cyst that was red and warm. Pt does not have fever but area very red and tender.  They have applied a warm compress over area today.  Small amount of purulent drainage noted.    Past Medical History:  Diagnosis Date   Adrenal insufficiency (HCC)    Anxiety    Chronic kidney disease, stage 3 (HCC) 11/13/2011   Erectile dysfunction    GERD (gastroesophageal reflux disease)    Gout 09/04/2013   H/O atrial flutter 03/07/2017   Hyperlipidemia 06/16/2014   Hypertension     Hypothyroidism (acquired) 04/14/2014   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HCC) 11/10/2010   OSA (obstructive sleep apnea) 07/24/2016   Pancreatic mass 08/01/2018   Renal carcinoma (HCC) 06/16/2015   RLS (restless legs syndrome) 07/24/2016   Sleep apnea    Testicular hypofunction    Tussive syncopes 10/15/2013   Type 2 diabetes mellitus (HCC)    Vitamin D deficiency disease    Past Surgical History:  Procedure Laterality Date   ADRENALECTOMY     CATARACT EXTRACTION  10/2014   CATARACT EXTRACTION EXTRACAPSULAR  11/05/2016   with Insertion Intraocular Prostheis.    COLONOSCOPY  05/06/2017   with removal lesions by snare.   CYSTECTOMY     Upper Neck/Chest Area, [facility]   NEPHRECTOMY     POPLITEAL SYNOVIAL CYST EXCISION     SPINE SURGERY  1989   THYROIDECTOMY     patient states they only removed half   TONSILLECTOMY      Allergies  Allergen Reactions   Other Rash    Blisters with bandaids Blisters with bandaids    Tegaderm Ag Mesh [Silver]    Tape Rash    Blisters, rash. Paper tape OK. Blisters, rash. Paper tape OK.     Outpatient Encounter Medications as of 08/07/2022  Medication Sig   Cholecalciferol (VITAMIN D3) 125 MCG (5000 UT) CAPS Take 1 capsule by mouth daily.   colchicine 0.6 MG tablet Take 1 tablet (0.6 mg total) by  mouth as needed. First sign of Gout Flare. Take 2 tablets ( 1.2mg ) by mouth at first sign of gout flare followed by 1 tablet ( 0.6mg ) after 1 hour. ( max 1.8mg  within 1 hours)   cyanocobalamin 1000 MCG tablet Take 1,000 mcg by mouth every other day.   cyclobenzaprine (FLEXERIL) 5 MG tablet Take 5 mg by mouth 3 (three) times daily.   fludrocortisone (FLORINEF) 0.1 MG tablet Take 0.1 mg by mouth daily. (Alternate dose every other day)   fludrocortisone (FLORINEF) 0.1 MG tablet Take 0.05 mg by mouth every other day. Alternating with other listing   gabapentin (NEURONTIN) 600 MG tablet Take 900 mg by mouth at bedtime.   hydrocortisone  (CORTEF) 10 MG tablet Take 1 tablets (15mg ) by mouth every morning,  tablet (5mg ) by mouth daily at lunchtime and take  tablet (5mg ) by mouth every evening at dinner   levothyroxine (SYNTHROID) 75 MCG tablet Take 75 mcg by mouth daily.   metFORMIN (GLUCOPHAGE) 500 MG tablet Take 500 mg by mouth daily.   mirabegron ER (MYRBETRIQ) 50 MG TB24 tablet Take 1 tablet (50 mg total) by mouth daily.   Multiple Vitamins-Minerals (PRESERVISION AREDS 2 PO) Take 1 capsule by mouth in the morning and at bedtime.   ondansetron (ZOFRAN) 4 MG tablet Take 1 tablet (4 mg total) by mouth every 6 (six) hours as needed for nausea.   PSYLLIUM HUSK PO Take 3.4 g by mouth every evening.   Accu-Chek Softclix Lancets lancets Use to check blood sugar once daily. Dx: E11.49   glucose blood (ACCU-CHEK AVIVA PLUS) test strip Use to test blood sugar once daily. Dx: E11.49   No facility-administered encounter medications on file as of 08/07/2022.    Review of Systems  Constitutional:  Negative for activity change and appetite change.  Skin:        Painful area to back    Immunization History  Administered Date(s) Administered   Hepatitis B, adult 06/18/2016, 07/19/2016, 06/18/2017   Influenza, High Dose Seasonal PF 06/18/2017, 06/18/2018   Influenza,inj,Quad PF,6+ Mos 06/09/2014, 06/15/2015, 06/18/2016   Influenza-Unspecified 06/09/2014, 06/15/2015, 06/18/2016, 06/18/2017, 06/16/2019   Moderna Covid-19 Vaccine Bivalent Booster 76yrs & up 06/12/2021, 02/06/2022   Moderna Sars-Covid-2 Vaccination 10/13/2019, 11/10/2019, 05/17/2020, 01/02/2021   Pneumococcal Conjugate-13 05/13/2014   Pneumococcal Polysaccharide-23 05/04/2011   Tdap 06/03/2013   Zoster, Live 11/30/2007   Pertinent  Health Maintenance Due  Topic Date Due   FOOT EXAM  Never done   OPHTHALMOLOGY EXAM  Never done   INFLUENZA VACCINE  05/01/2022   HEMOGLOBIN A1C  01/28/2023      07/31/2022   10:00 AM 07/31/2022    8:00 PM 08/01/2022    2:00 PM  08/01/2022    8:00 PM 08/02/2022    8:00 AM  Fall Risk  Patient Fall Risk Level High fall risk High fall risk High fall risk High fall risk High fall risk   Functional Status Survey:    Vitals:   08/07/22 1406  BP: 134/73  Pulse: 69  Resp: 16  Temp: (!) 95.9 F (35.5 C)  SpO2: 96%  Weight: 207 lb 8 oz (94.1 kg)  Height: 5\' 8"  (1.727 m)   Body mass index is 31.55 kg/m. Physical Exam Constitutional:      Appearance: Normal appearance.  Skin:    Comments: Redness, swelling and warmth noted to right upper back. Small amount of purulent drainage noted.    Neurological:     Mental Status: He is alert.  Labs reviewed: Recent Labs    01/01/22 0000 06/11/22 1404 06/19/22 1045 07/29/22 0905  NA 136*  --  134* 137  K 5.0  --  4.9 4.2  CL 101  --  103 103  CO2 27*  --  25 25  GLUCOSE  --   --  181* 164*  BUN 21  --  24* 24*  CREATININE 1.1 1.10 0.94 1.02  CALCIUM 9.3  --  8.8* 9.3   Recent Labs    05/07/22 0000 06/19/22 1045 07/29/22 0905  AST 16 25 21   ALT 19 22 23   ALKPHOS 83 82 86  BILITOT  --  0.6 0.9  PROT  --  7.0 7.2  ALBUMIN 4.3 3.9 3.9   Recent Labs    05/07/22 0000 06/19/22 1045 07/29/22 0905 08/06/22 1112  WBC 7.4 8.0 6.4 8.9  NEUTROABS 3,826.00 5.4 3.6  --   HGB 14.2 13.7 14.1 13.6  HCT 43 41.4 43.1 40.8  MCV  --  84.3 82.1 82.6  PLT 292 264 284 296   Lab Results  Component Value Date   TSH 3.18 01/01/2022   Lab Results  Component Value Date   HGBA1C 7.2 (H) 07/29/2022   Lab Results  Component Value Date   CHOL 173 07/30/2022   HDL 31 (L) 07/30/2022   LDLCALC 90 07/30/2022   TRIG 260 (H) 07/30/2022   CHOLHDL 5.6 07/30/2022    Significant Diagnostic Results in last 30 days:  MR BRAIN W WO CONTRAST  Result Date: 07/29/2022 CLINICAL DATA:  Intracranial hemorrhage. Known renal cell carcinoma. Pancreatic mass. EXAM: MRI HEAD WITHOUT AND WITH CONTRAST TECHNIQUE: Multiplanar, multiecho pulse sequences of the brain and surrounding  structures were obtained without and with intravenous contrast. CONTRAST:  10mL GADAVIST GADOBUTROL 1 MMOL/ML IV SOLN COMPARISON:  CT head without contrast 07/29/2022. MR head without contrast six/eighteen/twenty two FINDINGS: Brain: A 2.6 x 1.5 x 1.5 cm enhancing mass lesion in the left medial left parietal lobe is associated with the area of acute hemorrhage. No other enhancing lesions or focal hemorrhage is evident. No other foci of susceptibility are present. Mild edema surrounds the acute hemorrhage. Advanced atrophy is again noted. The ventricles are proportionate to the degree of atrophy. No significant extraaxial fluid collection is present. Vascular: Flow is present in the major intracranial arteries. Skull and upper cervical spine: The craniocervical junction is normal. Upper cervical spine is within normal limits. Marrow signal is unremarkable. Sinuses/Orbits: The paranasal sinuses and mastoid air cells are clear. Bilateral lens replacements are noted. Globes and orbits are otherwise unremarkable. IMPRESSION: 1. 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 2. No additional metastases present. 3. Stable moderate generalized atrophy. Electronically Signed   By: Marin Roberts M.D.   On: 07/29/2022 15:27   CT HEAD WO CONTRAST  Result Date: 07/29/2022 CLINICAL DATA:  Left-sided weakness. History of pancreatic carcinoma. Patient awoke this morning with left-sided weakness and left arm drift. EXAM: CT HEAD WITHOUT CONTRAST TECHNIQUE: Contiguous axial images were obtained from the base of the skull through the vertex without intravenous contrast. RADIATION DOSE REDUCTION: This exam was performed according to the departmental dose-optimization program which includes automated exposure control, adjustment of the mA and/or kV according to patient size and/or use of iterative reconstruction  technique. COMPARISON:  Head CT, 01/20/2021. Brain MRI, 03/18/2021. PET-CT, 07/05/2022. FINDINGS: Brain: Acute focal parenchymal hemorrhage, superomedial left parietal  lobe, measuring 2.7 x 1.4 x 2.1 cm, with mild surrounding vasogenic edema, but no significant mass effect. No other intracranial hemorrhage. No evidence of acute infarction. No defined mass or significant mass effect. No extra-axial masses or abnormal fluid collections. There is ventricular greater than sulcal enlargement consistent with atrophy. No hydrocephalus. Vascular: No hyperdense vessel or unexpected calcification. Skull: Normal. Negative for fracture or focal lesion. Sinuses/Orbits: Globes and orbits are unremarkable. Sinuses are essentially clear. Other: None. IMPRESSION: 1. 2.7 x 1.4 x 2.1 cm focus of acute parenchymal hemorrhage in the superior, medial left parietal lobe. Suspect this to be a hemorrhagic metastatic lesion given the history of metastatic pancreatic carcinoma. Consider follow-up brain MRI without and with contrast for further assessment. Critical Value/emergent results were called by telephone at the time of interpretation on 07/29/2022 at 10:01 am to provider [facility] , who verbally acknowledged these results. 2. No other acute abnormality. No other areas of intracranial hemorrhage. No acute infarction. Electronically Signed   By: Amie Portland M.D.   On: 07/29/2022 10:01    Assessment/Plan 1. Infected cyst of skin -discussed use of antibiotic but wife would like to hold off on this and use warm compress. -will do warm compresses 3-4 times daily, encouraged to massage around opening to encourage drainage.  If redness worsen to notify.   2. Do not resuscitate DNR and palliative care.    Janene Harvey. [facility] [phone]

## 2022-08-07 NOTE — Telephone Encounter (Signed)
This is pertaining to the appt that was cancelled for 08/09/22

## 2022-08-07 NOTE — Telephone Encounter (Signed)
Patient wife called and stated she want to update Damita Dunnings on patient is at [facility] and he undergoing radiation through the center. Can't come in until he is released from [facility]. Call back number [phone].

## 2022-08-08 ENCOUNTER — Other Ambulatory Visit: Payer: Self-pay

## 2022-08-08 ENCOUNTER — Ambulatory Visit
Admission: RE | Admit: 2022-08-08 | Discharge: 2022-08-08 | Disposition: A | Discharge status: Home / Self Care | Payer: Medicare Other | Source: Ambulatory Visit | Attending: Radiation Oncology | Admitting: Radiation Oncology

## 2022-08-08 DIAGNOSIS — Z510 Encounter for antineoplastic radiation therapy: Secondary | ICD-10-CM | POA: Diagnosis not present

## 2022-08-08 LAB — RAD ONC ARIA SESSION SUMMARY
Course Elapsed Days: 7
Plan Fractions Treated to Date: 5
Plan Prescribed Dose Per Fraction: 6 Gy
Plan Total Fractions Prescribed: 5
Plan Total Prescribed Dose: 30 Gy
Reference Point Dosage Given to Date: 30 Gy
Reference Point Session Dosage Given: 6 Gy
Session Number: 5

## 2022-08-08 NOTE — Telephone Encounter (Signed)
Noted.  Thanks.  I have been following along with the inpatient notes.  I appreciate the help of all involved.

## 2022-08-09 ENCOUNTER — Inpatient Hospital Stay: Payer: Medicare Other | Admitting: Family Medicine

## 2022-08-10 ENCOUNTER — Non-Acute Institutional Stay (SKILLED_NURSING_FACILITY): Payer: Medicare Other | Admitting: Student

## 2022-08-10 ENCOUNTER — Other Ambulatory Visit: Payer: Self-pay | Admitting: *Deleted

## 2022-08-10 ENCOUNTER — Encounter: Payer: Self-pay | Admitting: Student

## 2022-08-10 ENCOUNTER — Ambulatory Visit: Payer: Medicare Other

## 2022-08-10 DIAGNOSIS — L729 Follicular cyst of the skin and subcutaneous tissue, unspecified: Secondary | ICD-10-CM | POA: Diagnosis not present

## 2022-08-10 DIAGNOSIS — L089 Local infection of the skin and subcutaneous tissue, unspecified: Secondary | ICD-10-CM

## 2022-08-10 MED ORDER — DOXYCYCLINE HYCLATE 100 MG PO TABS: 100.0000 mg | ORAL_TABLET | Freq: Two times a day (BID) | ORAL | 0 refills | Status: DC

## 2022-08-10 NOTE — Progress Notes (Signed)
Location:  Other [place] Room Number: [address] of Service:  SNF [phone]) Provider:  Dr. Kearney Hard, MD  Patient Care Team: Tonia Ghent, MD as PCP - General (Family Medicine) Tony Huffman Tony Holiday, MD as Physician Assistant (Endocrinology) [name], MD as Referring Physician (Neurology)  Extended Emergency Contact Information Primary Emergency Contact: Tony, Huffman Mobile Phone: [phone] Relation: Spouse  Code Status:  DNR Goals of care: Advanced Directive information    08/10/2022   11:31 AM  Advanced Directives  Does Patient Have a Medical Advance Directive? Yes  Type of Advance Directive Out of facility DNR (pink MOST or yellow form)  Does patient want to make changes to medical advance directive? No - Patient declined     Chief Complaint  Patient presents with   Acute Visit    HPI:  Pt is a 76 y.o. male seen today for an acute visit for infected sebaceous cyst. Patient has a history of cysts per his wife. Earlier this week they expressed substantial amount of green fluid from the area with warm compresses. Wife has applied compresses BID when she is able to. Denies fevers, chills, other symptoms. He is going to start eating lunch with other residents. Wife has some concern that his mood has been a bit down, but hope to monitor it for a little while longer.    Past Medical History:  Diagnosis Date   Adrenal insufficiency (HCC)    Anxiety    Chronic kidney disease, stage 3 (HCC) 11/13/2011   Erectile dysfunction    GERD (gastroesophageal reflux disease)    Gout 09/04/2013   H/O atrial flutter 03/07/2017   Hyperlipidemia 06/16/2014   Hypertension    Hypothyroidism (acquired) 04/14/2014   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[place]) 11/10/2010   OSA (obstructive sleep apnea) 07/24/2016   Pancreatic mass 08/01/2018   Renal carcinoma ([place]) 06/16/2015   RLS (restless legs syndrome) 07/24/2016   Sleep apnea     Testicular hypofunction    Tussive syncopes 10/15/2013   Type 2 diabetes mellitus ([place])    Vitamin D deficiency disease    Past Surgical History:  Procedure Laterality Date   ADRENALECTOMY     CATARACT EXTRACTION  10/2014   CATARACT EXTRACTION EXTRACAPSULAR  11/05/2016   with Insertion Intraocular Prostheis.    COLONOSCOPY  05/06/2017   with removal lesions by snare.   CYSTECTOMY     Upper Neck/Chest Area, [facility]   NEPHRECTOMY     POPLITEAL SYNOVIAL CYST EXCISION     SPINE SURGERY  1989   THYROIDECTOMY     patient states they only removed half   TONSILLECTOMY      Allergies  Allergen Reactions   Other Rash    Blisters with bandaids Blisters with bandaids    Tegaderm Ag Mesh [Silver]    Tape Rash    Blisters, rash. Paper tape OK. Blisters, rash. Paper tape OK.     Outpatient Encounter Medications as of 08/10/2022  Medication Sig   acetaminophen (TYLENOL) 325 MG tablet Take 650 mg by mouth 3 (three) times daily.   Cholecalciferol (VITAMIN D3) 125 MCG (5000 UT) CAPS Take 1 capsule by mouth daily.   colchicine 0.6 MG tablet Take 1 tablet (0.6 mg total) by mouth as needed. First sign of Gout Flare. Take 2 tablets ( 1.'2mg'$ ) by mouth at first sign of gout flare followed by 1 tablet ( 0.'6mg'$ ) after 1 hour. ( max 1.'8mg'$  within 1  hours)   cyanocobalamin 1000 MCG tablet Take 1,000 mcg by mouth every other day.   cyclobenzaprine (FLEXERIL) 5 MG tablet Take 5 mg by mouth 3 (three) times daily.   fludrocortisone (FLORINEF) 0.1 MG tablet Take 0.1 mg by mouth every other day. (Alternate dose every other day)   fludrocortisone (FLORINEF) 0.1 MG tablet Take 0.5 mg by mouth every other day. Alternating with other listing   gabapentin (NEURONTIN) 600 MG tablet Take 900 mg by mouth at bedtime.   hydrocortisone (CORTEF) 10 MG tablet Take 1 tablets ('15mg'$ ) by mouth every morning,  tablet ('5mg'$ ) by mouth daily at lunchtime and take  tablet ('5mg'$ ) by mouth every evening at dinner    levothyroxine (SYNTHROID) 75 MCG tablet Take 75 mcg by mouth daily.   metFORMIN (GLUCOPHAGE) 500 MG tablet Take 500 mg by mouth 2 (two) times daily with a meal.   mirabegron ER (MYRBETRIQ) 50 MG TB24 tablet Take 1 tablet (50 mg total) by mouth daily.   Multiple Vitamins-Minerals (PRESERVISION AREDS 2 PO) Take 1 capsule by mouth in the morning and at bedtime.   ondansetron (ZOFRAN) 4 MG tablet Take 1 tablet (4 mg total) by mouth every 6 (six) hours as needed for nausea.   PSYLLIUM HUSK PO Take 3.4 g by mouth every evening.   [DISCONTINUED] Accu-Chek Softclix Lancets lancets Use to check blood sugar once daily. Dx: E11.49   [DISCONTINUED] glucose blood (ACCU-CHEK AVIVA PLUS) test strip Use to test blood sugar once daily. Dx: E11.49   No facility-administered encounter medications on file as of 08/10/2022.    Review of Systems  All other systems reviewed and are negative.   Immunization History  Administered Date(s) Administered   Hepatitis B, adult 06/18/2016, 07/19/2016, 06/18/2017   Influenza, High Dose Seasonal PF 06/18/2017, 06/18/2018   Influenza,inj,Quad PF,6+ Mos 06/09/2014, 06/15/2015, 06/18/2016   Influenza-Unspecified 06/09/2014, 06/15/2015, 06/18/2016, 06/18/2017, 06/16/2019   Moderna Covid-19 Vaccine Bivalent Booster 40yr & up 06/12/2021, 02/06/2022   Moderna Sars-Covid-2 Vaccination 10/13/2019, 11/10/2019, 05/17/2020, 01/02/2021   Pneumococcal Conjugate-13 05/13/2014   Pneumococcal Polysaccharide-23 05/04/2011   Tdap 06/03/2013   Zoster, Live 11/30/2007   Pertinent  Health Maintenance Due  Topic Date Due   FOOT EXAM  Never done   OPHTHALMOLOGY EXAM  Never done   INFLUENZA VACCINE  05/01/2022   HEMOGLOBIN A1C  01/28/2023      07/31/2022   10:00 AM 07/31/2022    8:00 PM 08/01/2022    2:00 PM 08/01/2022    8:00 PM 08/02/2022    8:00 AM  Fall Risk  Patient Fall Risk Level High fall risk High fall risk High fall risk High fall risk High fall risk   Functional Status  Survey:    Vitals:   08/10/22 1118  BP: 114/67  Pulse: 74  Resp: 16  Temp: (!) 96.3 F (35.7 C)  SpO2: 95%  Weight: 208 lb 3.2 oz (94.4 kg)  Height: '5\' 8"'$  (1.727 m)   Body mass index is 31.66 kg/m. Physical Exam Skin:    Comments: 1.5 cm red, indurated nodule of the upper back.   Neurological:     Mental Status: He is alert.     Labs reviewed: Recent Labs    01/01/22 0000 06/11/22 1404 06/19/22 1045 07/29/22 0905  NA 136*  --  134* 137  K 5.0  --  4.9 4.2  CL 101  --  103 103  CO2 27*  --  25 25  GLUCOSE  --   --  181* 164*  BUN 21  --  24* 24*  CREATININE 1.1 1.10 0.94 1.02  CALCIUM 9.3  --  8.8* 9.3   Recent Labs    05/07/22 0000 06/19/22 1045 07/29/22 0905  AST '16 25 21  '$ ALT '19 22 23  '$ ALKPHOS 83 82 86  BILITOT  --  0.6 0.9  PROT  --  7.0 7.2  ALBUMIN 4.3 3.9 3.9   Recent Labs    05/07/22 0000 06/19/22 1045 07/29/22 0905 08/06/22 1112  WBC 7.4 8.0 6.4 8.9  NEUTROABS 3,826.00 5.4 3.6  --   HGB 14.2 13.7 14.1 13.6  HCT 43 41.4 43.1 40.8  MCV  --  84.3 82.1 82.6  PLT 292 264 284 296   Lab Results  Component Value Date   TSH 3.18 01/01/2022   Lab Results  Component Value Date   HGBA1C 7.2 (H) 07/29/2022   Lab Results  Component Value Date   CHOL 173 07/30/2022   HDL 31 (L) 07/30/2022   LDLCALC 90 07/30/2022   TRIG 260 (H) 07/30/2022   CHOLHDL 5.6 07/30/2022    Significant Diagnostic Results in last 30 days:  MR BRAIN W WO CONTRAST  Result Date: 07/29/2022 CLINICAL DATA:  Intracranial hemorrhage. Known renal cell carcinoma. Pancreatic mass. EXAM: MRI HEAD WITHOUT AND WITH CONTRAST TECHNIQUE: Multiplanar, multiecho pulse sequences of the brain and surrounding structures were obtained without and with intravenous contrast. CONTRAST:  84m GADAVIST GADOBUTROL 1 MMOL/ML IV SOLN COMPARISON:  CT head without contrast 07/29/2022. MR head without contrast six/eighteen/twenty two FINDINGS: Brain: A 2.6 x 1.5 x 1.5 cm enhancing mass lesion in  the left medial left parietal lobe is associated with the area of acute hemorrhage. No other enhancing lesions or focal hemorrhage is evident. No other foci of susceptibility are present. Mild edema surrounds the acute hemorrhage. Advanced atrophy is again noted. The ventricles are proportionate to the degree of atrophy. No significant extraaxial fluid collection is present. Vascular: Flow is present in the major intracranial arteries. Skull and upper cervical spine: The craniocervical junction is normal. Upper cervical spine is within normal limits. Marrow signal is unremarkable. Sinuses/Orbits: The paranasal sinuses and mastoid air cells are clear. Bilateral lens replacements are noted. Globes and orbits are otherwise unremarkable. IMPRESSION: 1. 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 2. No additional metastases present. 3. Stable moderate generalized atrophy. Electronically Signed   By: CSan MorelleM.D.   On: 07/29/2022 15:27   CT HEAD WO CONTRAST  Result Date: 07/29/2022 CLINICAL DATA:  Left-sided weakness. History of pancreatic carcinoma. Patient awoke this morning with left-sided weakness and left arm drift. EXAM: CT HEAD WITHOUT CONTRAST TECHNIQUE: Contiguous axial images were obtained from the base of the skull through the vertex without intravenous contrast. RADIATION DOSE REDUCTION: This exam was performed according to the departmental dose-optimization program which includes automated exposure control, adjustment of the mA and/or kV according to patient size and/or use of iterative reconstruction technique. COMPARISON:  Head CT, 01/20/2021. Brain MRI, 03/18/2021. PET-CT, 07/05/2022. FINDINGS: Brain: Acute focal parenchymal hemorrhage, superomedial left parietal lobe, measuring 2.7 x 1.4 x 2.1 cm, with mild surrounding vasogenic edema, but no significant mass effect. No other  intracranial hemorrhage. No evidence of acute infarction. No defined mass or significant mass effect. No extra-axial masses or abnormal fluid collections. There is ventricular greater than sulcal enlargement consistent with atrophy. No hydrocephalus. Vascular: No  hyperdense vessel or unexpected calcification. Skull: Normal. Negative for fracture or focal lesion. Sinuses/Orbits: Globes and orbits are unremarkable. Sinuses are essentially clear. Other: None. IMPRESSION: 1. 2.7 x 1.4 x 2.1 cm focus of acute parenchymal hemorrhage in the superior, medial left parietal lobe. Suspect this to be a hemorrhagic metastatic lesion given the history of metastatic pancreatic carcinoma. Consider follow-up brain MRI without and with contrast for further assessment. Critical Value/emergent results were called by telephone at the time of interpretation on 07/29/2022 at 10:01 am to provider [facility] , who verbally acknowledged these results. 2. No other acute abnormality. No other areas of intracranial hemorrhage. No acute infarction. Electronically Signed   By: Lajean Manes M.D.   On: 07/29/2022 10:01    Assessment/Plan Infected cyst of skin Infected sebaceous cyst. Plan to start doxycycline '100mg'$  BID for 10 days. Discussed importance of continued warm compresses. Wife plans to call to schedule excision appointment ASAP with dermatology.    Family/ staff Communication: nursing  Labs/tests ordered:  none.  Tomasa Rand, MD, [facility] [phone]

## 2022-08-10 NOTE — Patient Outreach (Signed)
Mr. Tony Huffman resides in [place]. Utilized [facility] ACO 3-day SNF waiver. Screening for potential [facility] care coordination services as benefit of insurance plan and PCP.  Confirmed with [place] coverage for Tony Huffman in Admissions, Tony Huffman utilized the 3-day [facility] 2 ACO [place] [facility] waiver. Tony Huffman anticipated transition plan is to return to [facility] post SNF.   Will continue to follow while Tony Huffman resides in SNF.  Marthenia Rolling, MSN, RN,BSN [place] Acute Care Coordinator [phone] (Direct dial)

## 2022-08-17 ENCOUNTER — Ambulatory Visit: Payer: Medicare Other

## 2022-08-17 ENCOUNTER — Other Ambulatory Visit: Payer: Self-pay | Admitting: *Deleted

## 2022-08-17 NOTE — Patient Outreach (Signed)
Tony Huffman resides in [place] ([place]. Verified with Tony Huffman, Admissions Director, Tony Huffman did not utilize [facility] ACO [place] 3-day SNF waiver after all. He remained in hospital for 3 midnights. Hospitalized from 07/29/22 to 08/02/22.  Transition plan is to return home with spouse. Slowly progressing with therapy.   Will continue to follow for potential [facility] care coordination services as benefit of insurance plan and PCP.   Tony Rolling, MSN, RN,BSN [place] Acute Care Coordinator [phone] (Direct dial)

## 2022-08-31 ENCOUNTER — Ambulatory Visit: Payer: Medicare Other

## 2022-08-31 ENCOUNTER — Ambulatory Visit (INDEPENDENT_AMBULATORY_CARE_PROVIDER_SITE_OTHER): Payer: Medicare Other | Admitting: Family Medicine

## 2022-08-31 ENCOUNTER — Encounter: Payer: Self-pay | Admitting: Family Medicine

## 2022-08-31 VITALS — BP 110/58 | HR 85 | Temp 97.9°F | Wt 205.0 lb

## 2022-08-31 DIAGNOSIS — I619 Nontraumatic intracerebral hemorrhage, unspecified: Secondary | ICD-10-CM

## 2022-08-31 DIAGNOSIS — R059 Cough, unspecified: Secondary | ICD-10-CM

## 2022-08-31 DIAGNOSIS — R269 Unspecified abnormalities of gait and mobility: Secondary | ICD-10-CM | POA: Diagnosis not present

## 2022-08-31 MED ORDER — DOXYCYCLINE HYCLATE 100 MG PO TABS: 100.0000 mg | ORAL_TABLET | Freq: Two times a day (BID) | ORAL | 0 refills | Status: DC

## 2022-08-31 NOTE — Progress Notes (Signed)
L foot sores.  Falls when not in wheelchair.  Using a walker. He is back at [facility].  Pt is checking on HH.  Needs order for PT and OT at [facility].    IMPRESSION: 1. 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 2. No additional metastases present. 3. Stable moderate generalized atrophy.   S/p rady tx. Has f/u pending.   No pain.  Not SOB.  Still using CPAP.  Some cough.  Occ sputum, greenish, in the last week.

## 2022-08-31 NOTE — Patient Instructions (Addendum)
Let me know if you need to me work papers for home health.   I'll check on HH OT and PT.   Check with Dr. Baruch Gouty about follow up options.  If the cough is worse, then start doxy and update [place].   Take care.  Glad to see you.

## 2022-09-02 NOTE — Assessment & Plan Note (Signed)
History of, deconditioned and history of falls noted.  Discussed getting PT and OT set up at [facility].  Ordered.  Not anticoagulated.  Not on any antiplatelet agents.  He does not have any new focal deficits in the meantime.  He is on a follow-up with radiation oncology. 30 minutes were devoted to patient care in this encounter (this includes time spent reviewing the patient's file/history, interviewing and examining the patient, counseling/reviewing plan with patient).

## 2022-09-02 NOTE — Assessment & Plan Note (Signed)
Lungs are clear but if he has progressive symptoms I would start doxycycline.

## 2022-09-04 ENCOUNTER — Non-Acute Institutional Stay: Payer: Medicare Other | Admitting: Hospice

## 2022-09-04 DIAGNOSIS — E1165 Type 2 diabetes mellitus with hyperglycemia: Secondary | ICD-10-CM

## 2022-09-04 DIAGNOSIS — C649 Malignant neoplasm of unspecified kidney, except renal pelvis: Secondary | ICD-10-CM

## 2022-09-04 DIAGNOSIS — M109 Gout, unspecified: Secondary | ICD-10-CM

## 2022-09-04 DIAGNOSIS — G259 Extrapyramidal and movement disorder, unspecified: Secondary | ICD-10-CM

## 2022-09-04 DIAGNOSIS — Z515 Encounter for palliative care: Secondary | ICD-10-CM

## 2022-09-04 NOTE — Progress Notes (Signed)
  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    Contact Information     Name Relation Home Work Mobile   Huffman, Tony Spouse   [phone]        I met face to face with patient in the home. Palliative Care was asked to follow this patient by consultation request of  Duncan, Graham S, MD to address advance care planning and complex medical decision making. This is a follow up visit. Tony is with patient during visit. She requested an assessment of patient'Huffman ability to do ADLs to help with his LTC claim.   ASSESSMENT AND PLAN / RECOMMENDATIONS:    CODE STATUS: DNR  Symptom Management/Plan:  Metastatic renal carcinoma- no more chemo/radiation; comfort care is desired at this time. Previously on hospice, discharged d/t stability. Patient/family is interested in hospice service in the future.  F/u with Oncologist next week for surveillance.   Debility: Sequel to metastatic renal carcinoma, patient continues to decline in functional requiring extensive assistance in ADLS such bathing, dressing. He has significant difficulty with transfers and ambulation and needs assistance for safety; he is a high fall risk. He is able to feed self after set up.   Type 2 DM: Current A1c is 7.2 07/29/22. Continue Metformin. Diabetic diet, no concentrated sweets. Patient is a vegetarian and does mostly plant based diet. A1c in 3 - 6 months.   Urinary frequency:  Continue Myrbetriq 50 mg daily and follow up with urology as scheduled.   Gout flare up: No flare at this time. Colchicine on hand.   Movement disorder, abnormal gait, imbalance: PT/OT is in process.  Fall precautions; emphasized use of rolling walker to herlp prevent fall.  Follow up with neurology as scheduled.  Follow up Palliative Care Visit: Palliative care will continue to follow for complex medical decision making, advance care planning, and clarification of goals. Return in 8 weeks or prn.   This visit was coded based on medical decision making (MDM).  PPS: 50%  HOSPICE ELIGIBILITY/DIAGNOSIS: TBD  Chief Complaint: Palliative Medicine follow up visit.   HISTORY OF PRESENT ILLNESS:  Tony Huffman is a 76 y.o. year old male  with multiple morbidities requiring close monitoring/management with high risk of complications and morbidity: metastatic renal cell carcinoma, debility, Addison'Huffman disease, polyneuropathy, T2DM, hyperlipidemia, hypertension, atrial fibrillation, vitamin D deficiency, OSA, RLS, chronic gouty arthritis.   He continues to decline in functional status, requiring extensive assistance in ADLs. History obtained from review of EMR, discussion with primary team, and interview with family, facility staff/caregiver and/or Tony Huffman. All 10 point systems reviewed and are negative except as documented in history of present illness above  I reviewed available labs, medications, imaging, studies and related documents from the EMR.  Records reviewed and summarized above.   ROS   A 10 point review of systems is negative, except for the pertinent positives and negatives detailed in the HPI.  Physical Exam: Constitutional: NAD General: frail appearing EYES:   anicteric sclera, lids intact, no discharge, wears eye glasses ENMT: intact hearing, oral mucous membranes moist, dentition intact CV: S1S2, RRR, no LE edema Pulmonary: LCTA, no increased work of breathing, no cough, room  air Abdomen: normo-active BS + 4 quadrants, soft and non tender, no ascites MSK:  unsteady gait, requires one person assist and rolling walker to stand and ambulate short distance to the bathroom: 10 feet.  Skin: warm and dry, no rashes or wounds on visible skin Neuro: generalized weakness, A & O x 3, forgetful Psych: non-anxious affect, pleasant Hem/lymph/immuno: no widespread bruising   I spent 45 minutes providing this consultation; this includes time spent with patient/family, chart review and documentation. More than 50% of the time in this consultation was spent on counseling and coordinating communication. Thank you for the opportunity to participate in the care of Tony Huffman.  The palliative care team will continue to follow. Please call our office at [phone] if we can be of additional assistance.   Note: Portions of this note were generated with Dragon dictation software. Dictation errors may occur despite best attempts at proofreading.    Tony I [place], NP     

## 2022-09-04 NOTE — Addendum Note (Signed)
Addended by: Laverda Sorenson I on: 09/04/2022 04:54 PM   Modules accepted: Level of Service

## 2022-09-07 ENCOUNTER — Ambulatory Visit: Payer: Medicare Other

## 2022-09-11 ENCOUNTER — Ambulatory Visit
Admission: RE | Admit: 2022-09-11 | Discharge: 2022-09-11 | Disposition: A | Discharge status: Home / Self Care | Payer: Medicare Other | Source: Ambulatory Visit | Attending: Radiation Oncology | Admitting: Radiation Oncology

## 2022-09-11 VITALS — BP 129/78 | HR 81 | Resp 14

## 2022-09-11 DIAGNOSIS — I619 Nontraumatic intracerebral hemorrhage, unspecified: Secondary | ICD-10-CM

## 2022-09-11 DIAGNOSIS — Z923 Personal history of irradiation: Secondary | ICD-10-CM | POA: Diagnosis not present

## 2022-09-11 DIAGNOSIS — R918 Other nonspecific abnormal finding of lung field: Secondary | ICD-10-CM | POA: Diagnosis not present

## 2022-09-11 DIAGNOSIS — C649 Malignant neoplasm of unspecified kidney, except renal pelvis: Secondary | ICD-10-CM | POA: Insufficient documentation

## 2022-09-11 DIAGNOSIS — C7931 Secondary malignant neoplasm of brain: Secondary | ICD-10-CM | POA: Insufficient documentation

## 2022-09-11 NOTE — Progress Notes (Signed)
Radiation Oncology Follow up Note  Name: Tony Huffman   Date:   09/11/2022 MRN:  [id number] DOB: July 08, 1946    This 76 y.o. 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  REFERRING PROVIDER: Tonia Ghent, MD  HPI: Patient is a 76 year old male with history of stage IV renal cell carcinoma.  He is now 1 month out from partial brain irradiation for a solitary brain metastasis..  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  He also has bilateral hypermetabolic pulmonary nodules.  He is seen today in routine follow-up he is doing well is not having any headaches at this point time he continues to have left-sided weakness and is wheelchair-bound.  He is currently under palliative care at [facility].  Patient has refused any immunotherapy or workup with oncology at this time.  COMPLICATIONS OF TREATMENT: none  FOLLOW UP COMPLIANCE: keeps appointments   PHYSICAL EXAM:  BP 129/78   Pulse 81   Resp 62  Frail-appearing male wheelchair-bound in NAD.  No significant change in neurologic status still left-sided weakness.  Well-developed well-nourished patient in NAD. HEENT reveals PERLA, EOMI, discs not visualized.  Oral cavity is clear. No oral mucosal lesions are identified. Neck is clear without evidence of cervical or supraclavicular adenopathy. Lungs are clear to A&P. Cardiac examination is essentially unremarkable with regular rate and rhythm without murmur rub or thrill. Abdomen is benign with no organomegaly or masses noted. Motor sensory and DTR levels are equal and symmetric in the upper and lower extremities. Cranial nerves II through XII are grossly intact. Proprioception is intact. No peripheral adenopathy or edema is identified. No motor or sensory levels  are noted. Crude visual fields are within normal range.  RADIOLOGY RESULTS: No current films for review  PLAN: At this time I had a discussion with the wife patient is declining any further treatment or biopsy.  We discussed doing reimaging although I believe it is too early for that and certainly since patient is refusing any further treatment do not see the point in repeating imaging at this time.  I have asked him to contact Dr. Tasia Catchings should they change their mind and may be consider some type of systemic treatment.  Palliative care will also contact me should they feel any further radiation therapy may be advisable.  I would like to take this opportunity to thank you for allowing me to participate in the care of your patient.Noreene Filbert, MD

## 2022-09-14 ENCOUNTER — Telehealth: Payer: Self-pay | Admitting: Family Medicine

## 2022-09-14 ENCOUNTER — Ambulatory Visit: Payer: Medicare Other

## 2022-09-14 ENCOUNTER — Non-Acute Institutional Stay: Payer: Medicare Other | Admitting: Hospice

## 2022-09-14 DIAGNOSIS — C649 Malignant neoplasm of unspecified kidney, except renal pelvis: Secondary | ICD-10-CM

## 2022-09-14 DIAGNOSIS — E1165 Type 2 diabetes mellitus with hyperglycemia: Secondary | ICD-10-CM

## 2022-09-14 DIAGNOSIS — R519 Headache, unspecified: Secondary | ICD-10-CM

## 2022-09-14 DIAGNOSIS — R269 Unspecified abnormalities of gait and mobility: Secondary | ICD-10-CM

## 2022-09-14 DIAGNOSIS — Z515 Encounter for palliative care: Secondary | ICD-10-CM

## 2022-09-14 MED ORDER — DOXYCYCLINE HYCLATE 100 MG PO TABS: 100.0000 mg | ORAL_TABLET | Freq: Two times a day (BID) | ORAL | 0 refills | Status: DC

## 2022-09-14 NOTE — Addendum Note (Signed)
Addended by: Tonia Ghent on: 09/14/2022 01:46 PM   Modules accepted: Orders

## 2022-09-14 NOTE — Telephone Encounter (Signed)
Spoke with patient and he states it is not enlarging or draining yet; no fever but is warm to the touch

## 2022-09-14 NOTE — Progress Notes (Signed)
[facility] Consult Note Telephone: [phone]  Fax: [phone]    Date of encounter: 09/14/22 10:42 AM PATIENT NAME: Tony Huffman [address] [address]   [phone] (home)  DOB: 1946-09-04 MRN: [id number] PRIMARY CARE PROVIDER:    Tonia Ghent, MD,  [place] [address] [phone]  REFERRING PROVIDER:   Tonia Ghent, MD [address] 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    Contact Information     Name Relation Home Work [place], [place] Spouse   [phone]      TELEHEALTH VISIT STATEMENT Due to the COVID-19 crisis, this visit was done via telemedicine from my office and it was initiated and consent by this patient and or family.  I connected with patient OR PROXY by a telephone/video  and verified that I am speaking with the correct person. I discussed the limitations of evaluation and management by telemedicine. Patient/proxy expressed understanding and agreed to proceed.  Spouse requested telehealth visit due to patient's worsening decline in functional status as well as having headaches.  Visit consisted of counseling and education dealing with the complex and emotionally intense issues of symptom management and palliative care in the setting of serious and potentially life-threatening illness. Palliative care team will continue to support patient, patient's family, and medical team.  Patient reported not wanting more treatment for his cancer.  The need to respect his wish, de-escalate care and focus on comfort was discussed.  Patient interested in hospice services if condition continues to worsen.   NP confirmed to patient's that his assessment notes was faxed to [employer] as requested. I spent 16  minutes providing palliative consultation. More than 50% of the  time in this consultation was spent on counseling patient and coordinating communication. --------------------------------------------------------------------------------------------------------------------------------------   ASSESSMENT AND PLAN / RECOMMENDATIONS:    CODE STATUS: DNR  Symptom Management/Plan: Headaches: related to brian tumor. Radiation to head last month. Patient does not want to be treated anymore. Patient/family is interested in hospice service in the future.  Continue Tylenol 1000 mg twice daily.  Currently effective. Gait instability/frequent falls no injuries likely related the brain tumor. PT/OT in process for transfers. Fall precautions. Follow up with neurology as scheduled. Metastatic renal carcinoma- no more chemo/radiation; comfort care is desired at this time. Previously on hospice, discharged d/t stability.   Debility: Sequel to metastatic renal carcinoma, patient continues to decline in functional requiring extensive assistance in ADLS such bathing, dressing. He has significant difficulty with transfers and ambulation and needs assistance for safety; he is a high fall risk.  Decline in PPS from 50% 2 weeks ago to currently 40%. Type 2 DM: Current A1c is 7.2 07/29/22. Continue Metformin. Diabetic diet, no concentrated sweets. Patient is a vegetarian and does mostly plant based diet. A1c in 3 - 6 months.  Follow up Palliative Care Visit: Palliative care will continue to follow for complex medical decision making, advance care planning, and clarification of goals. Return in 2 weeks or prn.   This visit was coded based on medical decision making (MDM).  PPS: 40%  HOSPICE ELIGIBILITY/DIAGNOSIS: TBD  Chief Complaint: Palliative Medicine follow up visit.   HISTORY OF PRESENT ILLNESS:  Tony Huffman is a 76 y.o. year old male  with multiple morbidities requiring close monitoring/management with high risk of complications and morbidity: metastatic  renal cell carcinoma,  debility, Addison's disease, polyneuropathy, T2DM, hyperlipidemia, hypertension, atrial fibrillation, vitamin D deficiency, OSA, RLS, chronic gouty arthritis.  Complaining of throbbing headaches, well-managed with Tylenol.  He took 1000 mg Tylenol this morning, currently with no headache.  He continues to decline in functional status, requiring extensive assistance in ADLs. History obtained from review of EMR, discussion with primary team, and interview with family, facility staff/caregiver and/or Tony Huffman. All 10 point systems reviewed and are negative except as documented in history of present illness above  I reviewed available labs, medications, imaging, studies and related documents from the EMR.  Records reviewed and summarized above.    A 10 point review of systems is negative, except for the pertinent positives and negatives detailed in the HPI.  Thank you for the opportunity to participate in the care of Tony Huffman.  The palliative care team will continue to follow. Please call our office at [phone] if we can be of additional assistance.   Note: Portions of this note were generated with Lobbyist. Dictation errors may occur despite best attempts at proofreading.    Tony Spray, NP

## 2022-09-14 NOTE — Telephone Encounter (Signed)
I sent the rx for doxy.  Please see about how big the area is, if enlarging/draining, if having a fever.  Thanks.

## 2022-09-14 NOTE — Telephone Encounter (Signed)
Patient wife [name] called in and stated that Tony Huffman has a cyst on his chest. She was wondering if he could be put back on doxycycline or whatever you think is appropriate. She stated that it is painful and hot. Please advise. Thank you!

## 2022-09-17 ENCOUNTER — Encounter: Payer: Self-pay | Admitting: Family Medicine

## 2022-09-17 ENCOUNTER — Ambulatory Visit (INDEPENDENT_AMBULATORY_CARE_PROVIDER_SITE_OTHER): Payer: Medicare Other | Admitting: Family Medicine

## 2022-09-17 ENCOUNTER — Telehealth: Payer: Self-pay | Admitting: Family Medicine

## 2022-09-17 VITALS — BP 116/62 | HR 82 | Temp 97.6°F | Ht 68.0 in | Wt 205.0 lb

## 2022-09-17 DIAGNOSIS — R269 Unspecified abnormalities of gait and mobility: Secondary | ICD-10-CM

## 2022-09-17 DIAGNOSIS — L723 Sebaceous cyst: Secondary | ICD-10-CM | POA: Diagnosis not present

## 2022-09-17 DIAGNOSIS — L089 Local infection of the skin and subcutaneous tissue, unspecified: Secondary | ICD-10-CM | POA: Diagnosis not present

## 2022-09-17 NOTE — Patient Instructions (Signed)
Keep it covered, finish the doxy and use warm compresses a few times a day.  If any worse, then get rechecked.   Ask PT about a scooter and plan on recheck here in about 2 weeks, okay to do by video if needed.   Take care.  Glad to see you.

## 2022-09-17 NOTE — Progress Notes (Unsigned)
I&D  Meds, vitals, and allergies reviewed.   Indication: suspect abscess  Pt complaints of: erythema, pain, swelling  Location: chest wall  Size: 5cm  Informed consent obtained.  Pt aware of risks not limited to but including infection, bleeding, damage to near by organs.  Prep: etoh/betadine  Anesthesia: 1%lidocaine with epi, good effect  Incision made with #11 blade  Wound explored and loculations removed  Wound packed with iodoform gauze  Tolerated well  Routine postprocedure instructions d/w pt- remove packing in 24-48h, keep area clean and bandaged, follow up if concerns/spreading erythema/pain.   Balance is clearly worse, esp on testing w/o testing gait.

## 2022-09-17 NOTE — Telephone Encounter (Signed)
Patient sent mychart message with all information needed letter

## 2022-09-17 NOTE — Telephone Encounter (Signed)
Patient wife called and stated she will be receiving a message through my chart about Dr. Damita Dunnings writing a statement and what should in tell on the letter.

## 2022-09-18 ENCOUNTER — Observation Stay
Admission: EM | Admit: 2022-09-18 | Discharge: 2022-09-20 | Disposition: A | Discharge status: Skilled Nursing Facility | Payer: Medicare Other | Attending: Internal Medicine | Admitting: Internal Medicine

## 2022-09-18 ENCOUNTER — Emergency Department: Payer: Medicare Other

## 2022-09-18 ENCOUNTER — Other Ambulatory Visit: Payer: Self-pay

## 2022-09-18 DIAGNOSIS — Z515 Encounter for palliative care: Secondary | ICD-10-CM | POA: Insufficient documentation

## 2022-09-18 DIAGNOSIS — Z79899 Other long term (current) drug therapy: Secondary | ICD-10-CM | POA: Insufficient documentation

## 2022-09-18 DIAGNOSIS — Z87891 Personal history of nicotine dependence: Secondary | ICD-10-CM | POA: Diagnosis not present

## 2022-09-18 DIAGNOSIS — E039 Hypothyroidism, unspecified: Secondary | ICD-10-CM | POA: Diagnosis not present

## 2022-09-18 DIAGNOSIS — Z7722 Contact with and (suspected) exposure to environmental tobacco smoke (acute) (chronic): Secondary | ICD-10-CM | POA: Insufficient documentation

## 2022-09-18 DIAGNOSIS — M6281 Muscle weakness (generalized): Principal | ICD-10-CM | POA: Insufficient documentation

## 2022-09-18 DIAGNOSIS — E1122 Type 2 diabetes mellitus with diabetic chronic kidney disease: Secondary | ICD-10-CM | POA: Diagnosis not present

## 2022-09-18 DIAGNOSIS — Z1152 Encounter for screening for COVID-19: Secondary | ICD-10-CM | POA: Insufficient documentation

## 2022-09-18 DIAGNOSIS — R2681 Unsteadiness on feet: Secondary | ICD-10-CM | POA: Diagnosis not present

## 2022-09-18 DIAGNOSIS — L03313 Cellulitis of chest wall: Secondary | ICD-10-CM | POA: Diagnosis not present

## 2022-09-18 DIAGNOSIS — E119 Type 2 diabetes mellitus without complications: Secondary | ICD-10-CM

## 2022-09-18 DIAGNOSIS — Z9181 History of falling: Secondary | ICD-10-CM | POA: Diagnosis not present

## 2022-09-18 DIAGNOSIS — Z7984 Long term (current) use of oral hypoglycemic drugs: Secondary | ICD-10-CM | POA: Insufficient documentation

## 2022-09-18 DIAGNOSIS — R531 Weakness: Secondary | ICD-10-CM | POA: Diagnosis present

## 2022-09-18 DIAGNOSIS — Z66 Do not resuscitate: Secondary | ICD-10-CM | POA: Diagnosis not present

## 2022-09-18 DIAGNOSIS — N183 Chronic kidney disease, stage 3 unspecified: Secondary | ICD-10-CM | POA: Insufficient documentation

## 2022-09-18 DIAGNOSIS — R509 Fever, unspecified: Secondary | ICD-10-CM | POA: Diagnosis not present

## 2022-09-18 DIAGNOSIS — E271 Primary adrenocortical insufficiency: Secondary | ICD-10-CM | POA: Insufficient documentation

## 2022-09-18 DIAGNOSIS — C7400 Malignant neoplasm of cortex of unspecified adrenal gland: Secondary | ICD-10-CM | POA: Diagnosis not present

## 2022-09-18 DIAGNOSIS — R5383 Other fatigue: Secondary | ICD-10-CM | POA: Insufficient documentation

## 2022-09-18 DIAGNOSIS — C7931 Secondary malignant neoplasm of brain: Secondary | ICD-10-CM | POA: Insufficient documentation

## 2022-09-18 DIAGNOSIS — E2740 Unspecified adrenocortical insufficiency: Secondary | ICD-10-CM | POA: Diagnosis not present

## 2022-09-18 DIAGNOSIS — C649 Malignant neoplasm of unspecified kidney, except renal pelvis: Secondary | ICD-10-CM | POA: Diagnosis not present

## 2022-09-18 DIAGNOSIS — I129 Hypertensive chronic kidney disease with stage 1 through stage 4 chronic kidney disease, or unspecified chronic kidney disease: Secondary | ICD-10-CM | POA: Diagnosis not present

## 2022-09-18 DIAGNOSIS — L02213 Cutaneous abscess of chest wall: Secondary | ICD-10-CM | POA: Insufficient documentation

## 2022-09-18 DIAGNOSIS — Z7189 Other specified counseling: Secondary | ICD-10-CM

## 2022-09-18 DIAGNOSIS — I4891 Unspecified atrial fibrillation: Secondary | ICD-10-CM | POA: Insufficient documentation

## 2022-09-18 LAB — COMPREHENSIVE METABOLIC PANEL
ALT: 21 U/L (ref 0–44)
AST: 24 U/L (ref 15–41)
Albumin: 3.8 g/dL (ref 3.5–5.0)
Alkaline Phosphatase: 79 U/L (ref 38–126)
Anion gap: 11 (ref 5–15)
BUN: 22 mg/dL (ref 8–23)
CO2: 22 mmol/L (ref 22–32)
Calcium: 9.2 mg/dL (ref 8.9–10.3)
Chloride: 101 mmol/L (ref 98–111)
Creatinine, Ser: 1.05 mg/dL (ref 0.61–1.24)
GFR, Estimated: >60 mL/min (ref >60)
Glucose, Bld: 174 mg/dL — ABNORMAL HIGH (ref 70–99)
Potassium: 4.7 mmol/L (ref 3.5–5.1)
Sodium: 134 mmol/L — ABNORMAL LOW (ref 135–145)
Total Bilirubin: 0.9 mg/dL (ref 0.3–1.2)
Total Protein: 6.9 g/dL (ref 6.5–8.1)

## 2022-09-18 LAB — CBC WITH DIFFERENTIAL/PLATELET
Abs Immature Granulocytes: 0.05 10*3/uL (ref 0.00–0.07)
Basophils Absolute: 0.1 10*3/uL (ref 0.0–0.1)
Basophils Relative: 1 %
Eosinophils Absolute: 0.3 10*3/uL (ref 0.0–0.5)
Eosinophils Relative: 3 %
HCT: 41.1 % (ref 39.0–52.0)
Hemoglobin: 13.2 g/dL (ref 13.0–17.0)
Immature Granulocytes: 1 %
Lymphocytes Relative: 19 %
Lymphs Abs: 1.8 10*3/uL (ref 0.7–4.0)
MCH: 26.8 pg (ref 26.0–34.0)
MCHC: 32.1 g/dL (ref 30.0–36.0)
MCV: 83.4 fL (ref 80.0–100.0)
Monocytes Absolute: 0.8 10*3/uL (ref 0.1–1.0)
Monocytes Relative: 8 %
Neutro Abs: 6.5 10*3/uL (ref 1.7–7.7)
Neutrophils Relative %: 68 %
Platelets: 326 10*3/uL (ref 150–400)
RBC: 4.93 MIL/uL (ref 4.22–5.81)
RDW: 15.4 % (ref 11.5–15.5)
WBC: 9.4 10*3/uL (ref 4.0–10.5)
nRBC: 0 % (ref 0.0–0.2)

## 2022-09-18 LAB — URINALYSIS, ROUTINE W REFLEX MICROSCOPIC
Bilirubin Urine: NEGATIVE
Glucose, UA: NEGATIVE mg/dL
Hgb urine dipstick: NEGATIVE
Ketones, ur: NEGATIVE mg/dL
Leukocytes,Ua: NEGATIVE
Nitrite: NEGATIVE
Protein, ur: NEGATIVE mg/dL
Specific Gravity, Urine: 1.019 (ref 1.005–1.030)
pH: 5 (ref 5.0–8.0)

## 2022-09-18 LAB — BRAIN NATRIURETIC PEPTIDE: B Natriuretic Peptide: 21.3 pg/mL (ref 0.0–100.0)

## 2022-09-18 LAB — RESP PANEL BY RT-PCR (FLU A&B, COVID) ARPGX2
Influenza A by PCR: NEGATIVE
Influenza B by PCR: NEGATIVE
SARS Coronavirus 2 by RT PCR: NEGATIVE

## 2022-09-18 LAB — TSH: TSH: 2.332 u[IU]/mL (ref 0.350–4.500)

## 2022-09-18 LAB — CBG MONITORING, ED
Glucose-Capillary: 149 mg/dL — ABNORMAL HIGH (ref 70–99)
Glucose-Capillary: 278 mg/dL — ABNORMAL HIGH (ref 70–99)

## 2022-09-18 LAB — TROPONIN I (HIGH SENSITIVITY)
Troponin I (High Sensitivity): 3 ng/L (ref <18)
Troponin I (High Sensitivity): 3 ng/L (ref <18)

## 2022-09-18 MED ORDER — SODIUM CHLORIDE 0.9 % IV SOLN: 2.0000 g | INTRAVENOUS | Status: DC

## 2022-09-18 MED ORDER — SODIUM CHLORIDE 0.9 % IV BOLUS
500.0000 mL | Freq: Once | INTRAVENOUS | Status: AC
  Administered 2022-09-18: 500 mL via INTRAVENOUS

## 2022-09-18 MED ORDER — ONDANSETRON HCL 4 MG/2ML IJ SOLN: 4.0000 mg | Freq: Four times a day (QID) | INTRAMUSCULAR | Status: DC | PRN

## 2022-09-18 MED ORDER — VANCOMYCIN HCL 2000 MG/400ML IV SOLN
2000.0000 mg | Freq: Once | INTRAVENOUS | Status: AC
  Administered 2022-09-18: 2000 mg via INTRAVENOUS
  Filled 2022-09-18: qty 400

## 2022-09-18 MED ORDER — INSULIN ASPART 100 UNIT/ML IJ SOLN
0.0000 [IU] | Freq: Every day | INTRAMUSCULAR | Status: DC
  Administered 2022-09-18: 3 [IU] via SUBCUTANEOUS
  Administered 2022-09-19: 2 [IU] via SUBCUTANEOUS
  Filled 2022-09-18 (×2): qty 1

## 2022-09-18 MED ORDER — LACTATED RINGERS IV SOLN: INTRAVENOUS | Status: AC

## 2022-09-18 MED ORDER — SODIUM CHLORIDE 0.9 % IV SOLN
2.0000 g | Freq: Once | INTRAVENOUS | Status: AC
  Administered 2022-09-18: 2 g via INTRAVENOUS
  Filled 2022-09-18: qty 20

## 2022-09-18 MED ORDER — HYDROCORTISONE 5 MG PO TABS
15.0000 mg | ORAL_TABLET | Freq: Every day | ORAL | Status: DC
  Administered 2022-09-19 – 2022-09-20 (×2): 15 mg via ORAL
  Filled 2022-09-18 (×2): qty 1

## 2022-09-18 MED ORDER — HYDROCORTISONE 5 MG PO TABS
5.0000 mg | ORAL_TABLET | Freq: Two times a day (BID) | ORAL | Status: DC
  Administered 2022-09-19 – 2022-09-20 (×4): 5 mg via ORAL
  Filled 2022-09-18 (×4): qty 1

## 2022-09-18 MED ORDER — ACETAMINOPHEN 325 MG PO TABS
650.0000 mg | ORAL_TABLET | Freq: Four times a day (QID) | ORAL | Status: DC | PRN
  Administered 2022-09-19 – 2022-09-20 (×2): 650 mg via ORAL
  Filled 2022-09-18 (×3): qty 2

## 2022-09-18 MED ORDER — LEVOTHYROXINE SODIUM 50 MCG PO TABS
75.0000 ug | ORAL_TABLET | Freq: Every day | ORAL | Status: DC
  Administered 2022-09-19 – 2022-09-20 (×2): 75 ug via ORAL
  Filled 2022-09-18 (×2): qty 1

## 2022-09-18 MED ORDER — POLYETHYLENE GLYCOL 3350 17 G PO PACK: 17.0000 g | PACK | Freq: Every day | ORAL | Status: DC | PRN

## 2022-09-18 MED ORDER — INSULIN ASPART 100 UNIT/ML IJ SOLN
0.0000 [IU] | Freq: Three times a day (TID) | INTRAMUSCULAR | Status: DC
  Administered 2022-09-18 – 2022-09-19 (×4): 1 [IU] via SUBCUTANEOUS
  Administered 2022-09-20: 3 [IU] via SUBCUTANEOUS
  Filled 2022-09-18 (×5): qty 1

## 2022-09-18 MED ORDER — ACETAMINOPHEN 650 MG RE SUPP: 650.0000 mg | Freq: Four times a day (QID) | RECTAL | Status: DC | PRN

## 2022-09-18 MED ORDER — DOXYCYCLINE HYCLATE 100 MG PO TABS
100.0000 mg | ORAL_TABLET | Freq: Two times a day (BID) | ORAL | Status: DC
  Administered 2022-09-18 – 2022-09-20 (×4): 100 mg via ORAL
  Filled 2022-09-18 (×4): qty 1

## 2022-09-18 MED ORDER — HYDROCORTISONE SOD SUC (PF) 100 MG IJ SOLR
100.0000 mg | Freq: Once | INTRAMUSCULAR | Status: AC
  Administered 2022-09-18: 100 mg via INTRAVENOUS
  Filled 2022-09-18: qty 2

## 2022-09-18 MED ORDER — VITAMIN B-12 1000 MCG PO TABS
1000.0000 ug | ORAL_TABLET | ORAL | Status: DC
  Administered 2022-09-20: 1000 ug via ORAL
  Filled 2022-09-18: qty 2
  Filled 2022-09-18 (×2): qty 1

## 2022-09-18 MED ORDER — FLUDROCORTISONE ACETATE 0.1 MG PO TABS
0.1000 mg | ORAL_TABLET | ORAL | Status: DC
  Administered 2022-09-19: 0.1 mg via ORAL
  Filled 2022-09-18: qty 1

## 2022-09-18 MED ORDER — ONDANSETRON HCL 4 MG PO TABS: 4.0000 mg | ORAL_TABLET | Freq: Four times a day (QID) | ORAL | Status: DC | PRN

## 2022-09-18 MED ORDER — ENOXAPARIN SODIUM 40 MG/0.4ML IJ SOSY
40.0000 mg | PREFILLED_SYRINGE | INTRAMUSCULAR | Status: DC
  Administered 2022-09-18: 40 mg via SUBCUTANEOUS
  Filled 2022-09-18: qty 0.4

## 2022-09-18 MED ORDER — FLUDROCORTISONE ACETATE 0.1 MG PO TABS
0.0500 mg | ORAL_TABLET | ORAL | Status: DC
  Administered 2022-09-20: 0.05 mg via ORAL
  Filled 2022-09-18: qty 0.5

## 2022-09-18 MED ORDER — FLUDROCORTISONE ACETATE 0.1 MG PO TABS: 0.5000 mg | ORAL_TABLET | ORAL | Status: DC

## 2022-09-18 NOTE — Assessment & Plan Note (Signed)
Patient has declined further management and is considering moving on with hospice.

## 2022-09-18 NOTE — ED Notes (Signed)
Pt wife at bedside and states that pt is on the 3 round of doxycycline for a cough that pt has had 6 weeks. Pt wife also states that pt went to their GP yesterday to clean out a cyst on his chest but was unable to clean out and pt will need to go back in two weeks (10/02/22) to get that done.

## 2022-09-18 NOTE — ED Triage Notes (Signed)
See first RN note. Pt states he did not fall, but could not get out of bed this morning. Pt answering all questions appropriately. Pt denies current pain, Pt states headache earlier, which has resolved.

## 2022-09-18 NOTE — ED Provider Notes (Signed)
[facility] Provider Note   Event Date/Time   First MD Initiated Contact with Patient 12023/09/27 1511     (approximate)  History   Weakness  HPI  Tony Huffman is a 76 y.o. male followed for the history of a brain tumor also recently treated for a chest abscess doxycycline.  Further notes reveals a history of Addison's disease polyneuropathy hypertension atrial fibrillation and gout.   Workup this morning and was noted to have a fever 99.7.  He was too fatigued and felt weak in his muscles to get himself out of bed.  He did take his 15 mg hydrocortisone today.  He has been recently treated with doxycycline for cellulitis and an abscess over his breastbone, that does not seem to be improving.  He suffers from some intermittent chronic headaches due to his known brain metastases.  He is currently under care for metastatic cancer, and he and his wife reports that he has not pursued further chemotherapy and rather is taking a more palliative approach  No cough no chest pain or shortness of breath.  No numbness or weakness, no trouble breathing.  Reports he just feels extremely weak and fatigued to the point that he feels like he has no muscle strength get himself up from the bed which is quite atypical for him  Physical Exam   Triage Vital Signs: ED Triage Vitals  Enc Vitals Group     BP 12023/09/27 0954 117/76     Pulse Rate 12023/09/27 0954 83     Resp 12023/09/27 0954 18     Temp 12023/09/27 0954 98.3 F (36.8 C)     Temp Source 12023/09/27 1313 Oral     SpO2 12023/09/27 0954 95 %     Weight 12023/09/27 0954 205 lb (93 kg)     Height 12023/09/27 0954 '5\' 8"'$  (1.727 m)     Head Circumference --      Peak Flow --      Pain Score 12023/09/27 0954 0     Pain Loc --      Pain Edu? --      Excl. in [place]? --     Most recent vital signs: Vitals:   12023/09/27 1313 12023/09/27 1444  BP: 135/81 135/74  Pulse: 88 81  Resp: 18 18  Temp: 98.2 F (36.8 C) 98.2 F (36.8 C)  SpO2: 94% 95%      General: Awake, no distress.  He and his wife both very pleasant CV:  Good peripheral perfusion.  Normal tones Resp:  Normal effort.  Clear bilateral Approximately 1 x 2 cm region of erythema and some induration overlying the superior anterior sternum with surrounding erythema about the size of a palm, the lower border does appear to have previous incision and purulent drainage is present Abd:  No distention.  Other:  Moves all extremities, but appears generally fatigued.  No obvious motor deficits.   ED Results / Procedures / Treatments   Labs (all labs ordered are listed, but only abnormal results are displayed) Labs Reviewed  COMPREHENSIVE METABOLIC PANEL - Abnormal; Notable for the following components:      Result Value   Sodium 134 (*)    Glucose, Bld 174 (*)    All other components within normal limits  URINALYSIS, ROUTINE W REFLEX MICROSCOPIC - Abnormal; Notable for the following components:   Color, Urine YELLOW (*)    APPearance CLEAR (*)    All other components within normal limits  CBG MONITORING,  ED - Abnormal; Notable for the following components:   Glucose-Capillary 149 (*)    All other components within normal limits  RESP PANEL BY RT-PCR (FLU A&B, COVID) ARPGX2  CULTURE, BLOOD (ROUTINE X 2)  CULTURE, BLOOD (ROUTINE X 2)  AEROBIC CULTURE W GRAM STAIN (SUPERFICIAL SPECIMEN)  BRAIN NATRIURETIC PEPTIDE  CBC WITH DIFFERENTIAL/PLATELET  TSH  CBG MONITORING, ED  TROPONIN I (HIGH SENSITIVITY)  TROPONIN I (HIGH SENSITIVITY)     EKG  Interpreted me at 1010 heart rate 80 QRS 90 QTc 430 Normal sinus rhythm no evidence of acute ischemia, mild nonspecific T wave abnormality   RADIOLOGY   CT Head Wo Contrast  Result Date: 110-01-2022 CLINICAL DATA:  76 year old male with sudden severe headache. Metastatic renal cell carcinoma, known left parietal lobe metastasis. EXAM: CT HEAD WITHOUT CONTRAST TECHNIQUE: Contiguous axial images were obtained from the base of  the skull through the vertex without intravenous contrast. RADIATION DOSE REDUCTION: This exam was performed according to the departmental dose-optimization program which includes automated exposure control, adjustment of the mA and/or kV according to patient size and/or use of iterative reconstruction technique. COMPARISON:  Brain MRI and head CT 07/29/2022. FINDINGS: Brain: Lobulated and hyperdense medial and superior right parietal lobe brain metastasis, with internal hemorrhage demonstrated on MRI in October, now measures 28 x 17 by 15 mm (AP by transverse by CC) versus 26 x 14 x 16 mm and October. But regional vasogenic edema has substantially increased and is moderate. Still, there is only mild mass effect on the left lateral ventricle. No intraventricular hemorrhage.  No extra-axial hemorrhage. Underlying generalized cerebral volume loss. No midline shift. Basilar cisterns remain patent. Outside of the left parietal lobe gray-white differentiation appears stable. No cortically based acute infarct identified. Vascular: Calcified atherosclerosis at the skull base. No suspicious intracranial vascular hyperdensity. Skull: No acute or suspicious osseous lesion identified. Sinuses/Orbits: Visualized paranasal sinuses and mastoids are stable and well aerated. Other: No acute orbit or scalp soft tissue finding. IMPRESSION: 1. Essentially stable size of a lobulated 2.8 cm left parietal lobe Metastasis since October, but increased and moderate regional Vasogenic Edema since that time. Mild associated mass effect on the left lateral ventricle. 2. But no midline shift. No new metastatic disease evident by noncontrast CT (MRI without and with contrast would be most sensitive and specific). No new intracranial abnormality identified. Electronically Signed   By: Genevie Ann M.D.   On: 110-01-2022 10:52   DG Chest 2 View  Result Date: 110-01-2022 CLINICAL DATA:  Confusion. History of metastatic renal cell carcinoma. EXAM:  CHEST - 2 VIEW COMPARISON:  PET-CT 07/05/2022 FINDINGS: The cardiac silhouette, mediastinal and hilar contours are stable. Stable pulmonary nodules consistent with known metastatic disease. No superimposed infiltrates or effusions. The bony thorax is grossly intact. IMPRESSION: 1. No acute cardiopulmonary findings. 2. Stable pulmonary nodules consistent with known metastatic disease. Electronically Signed   By: Marijo Sanes M.D.   On: 110-01-2022 09:43    [place] interpreted by me as fluid collection that is potentially concerning for abscess. Radiology report:  IMPRESSION: 2.5 x 1.0 x 2.6 cm complex fluid collection in the superficial anterior chest wall near the sternum, at the site of patient concern. This is nonspecific by ultrasound but imaging features could be compatible with abscess.   Subcutaneous edema noted in the adjacent regional soft tissues of the anterior chest wall.      PROCEDURES:  Critical note performed: No  Procedures   MEDICATIONS ORDERED IN ED:  Medications  enoxaparin (LOVENOX) injection 40 mg (has no administration in time range)  lactated ringers infusion (has no administration in time range)  acetaminophen (TYLENOL) tablet 650 mg (has no administration in time range)    Or  acetaminophen (TYLENOL) suppository 650 mg (has no administration in time range)  polyethylene glycol (MIRALAX / GLYCOLAX) packet 17 g (has no administration in time range)  ondansetron (ZOFRAN) tablet 4 mg (has no administration in time range)    Or  ondansetron (ZOFRAN) injection 4 mg (has no administration in time range)  insulin aspart (novoLOG) injection 0-5 Units (has no administration in time range)  insulin aspart (novoLOG) injection 0-9 Units (1 Units Subcutaneous Given 12023/07/28 1818)  cyanocobalamin (VITAMIN B12) tablet 1,000 mcg (1,000 mcg Oral Not Given 12023/07/28 1752)  doxycycline (VIBRA-TABS) tablet 100 mg (has no administration in time range)  levothyroxine (SYNTHROID)  tablet 75 mcg (has no administration in time range)  vancomycin (VANCOREADY) IVPB 2000 mg/400 mL (2,000 mg Intravenous New Bag/Given 12023/07/28 1743)  cefTRIAXone (ROCEPHIN) 2 g in sodium chloride 0.9 % 100 mL IVPB (has no administration in time range)  fludrocortisone (FLORINEF) tablet 0.1 mg (has no administration in time range)  fludrocortisone (FLORINEF) tablet 0.05 mg (has no administration in time range)  hydrocortisone (CORTEF) tablet 15 mg (has no administration in time range)  hydrocortisone (CORTEF) tablet 5 mg (has no administration in time range)  hydrocortisone sodium succinate (SOLU-CORTEF) 100 MG injection 100 mg (100 mg Intravenous Given 12023/07/28 1735)  sodium chloride 0.9 % bolus 500 mL (500 mLs Intravenous New Bag/Given 12023/07/28 1732)  cefTRIAXone (ROCEPHIN) 2 g in sodium chloride 0.9 % 100 mL IVPB (2 g Intravenous New Bag/Given 12023/07/28 1738)     IMPRESSION / MDM / ASSESSMENT AND PLAN / ED COURSE  I reviewed the triage vital signs and the nursing notes.                              Differential diagnosis includes, but is not limited to, cellulitis and possible underlying ongoing abscess, bacteremia though this seems unlikely with no fever or elevated white count at this time, but again is not excluded, also minus differential would be potential adrenal crisis given that he has not had a fever this morning active infection on his chest wall and has known Addison's disease with severe adrenal deficiency, other underlying electrolyte abnormalities etc.  His workup here including CT of the head is reassuring though small amount of surrounding edema is noted around his tumor, he will be treated with steroids IV fluids, and given the fact that he is having fevers, has adrenal insufficiency and has a feeling of generalized weakness to the point he is unable to get himself out of bed I think it would be very reasonable for him to be admitted and treated with IV antibiotics steroids and  further workup under the hospitalist service.  I discussed with the patient and his wife, they understand and they are agreeable to plan for inpatient treatment.  Additionally I discussed the case and patient is excepted to hospitalist by Dr. Reesa Chew.   Patient's presentation is most consistent with acute illness / injury with system symptoms.  ----------------------------------------- 6:25 PM on 101/24/23 ----------------------------------------- Routine consult placed to general surgery regarding concerns of patient's abscess which has been previously incised by the patient's primary care doctor.  Notification sent to Dr. Lysle Pearl    FINAL CLINICAL IMPRESSION(S) / ED DIAGNOSES  Final diagnoses:  Adrenal insufficiency (Addison's disease) ([place])  Generalized weakness  Cellulitis of chest wall     Rx / DC Orders   ED Discharge Orders     None        Note:  This document was prepared using Dragon voice recognition software and may include unintentional dictation errors.   Delman Kitten, MD 101-15-23 [phone]

## 2022-09-18 NOTE — IPAL (Signed)
  Interdisciplinary Goals of Care Family Meeting   Date carried out: 105/30/23  Location of the meeting: Bedside  Member's involved: Physician, nurse, patient and wife  Durable Power of Attorney or acting medical decision maker: Wife  Discussion: We discussed goals of care for [employer] .  With wife and patient who is suffering from advanced malignancy, has stopped further treatment, currently under palliative care and considering moving to hospice.  Code status:   Code Status: DNR   Disposition: Continue current acute care  Time spent for the meeting: 30 minutes.    Lorella Nimrod, MD  105/30/23, 6:01 PM

## 2022-09-18 NOTE — Consult Note (Signed)
Subjective:   CC: Chest wall abscess  HPI:  Tony Huffman is a 76 y.o. male who was consulted by [facility] for evaluation of above.  First noted several days ago.  Attempted I&D in the PCPs office but they were unable to completed due to extravasation of the local anesthesia infused according to the patient.  Presented to the ED for increasing generalized weakness.  Surgery consulted for reevaluation of the abscess   Past Medical History:  has a past medical history of Adrenal insufficiency ([place]), Anxiety, Chronic kidney disease, stage 3 ([place]) (11/13/2011), Erectile dysfunction, GERD (gastroesophageal reflux disease), Gout (09/04/2013), H/O atrial flutter (03/07/2017), Hyperlipidemia (06/16/2014), Hypertension, Hypothyroidism (acquired) (04/14/2014), 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[place]) (11/10/2010), OSA (obstructive sleep apnea) (07/24/2016), Pancreatic mass (08/01/2018), Renal carcinoma ([place]) (06/16/2015), RLS (restless legs syndrome) (07/24/2016), Sleep apnea, Testicular hypofunction, Tussive syncopes (10/15/2013), Type 2 diabetes mellitus ([place]), and Vitamin D deficiency disease.  Past Surgical History:  has a past surgical history that includes Cataract extraction (10/2014); Tonsillectomy; Cataract extraction, extracapsular (11/05/2016); Thyroidectomy; Nephrectomy; Adrenalectomy; Popliteal synovial cyst excision; Spine surgery (1989); Colonoscopy (05/06/2017); and Cystectomy.  Family History: family history includes Cancer in his father and mother; Coronary artery disease in his brother, father, and paternal grandfather; Diabetes in his brother; Diabetes type II in his brother; Heart disease in his brother and father; Hypertension in his father; Macular degeneration in his father; Ovarian cancer in his mother.  Social History:  reports that he quit smoking about 45 years ago. His smoking use included cigarettes. He has a 10.00 pack-year smoking history. He has been exposed to tobacco smoke. He has  never used smokeless tobacco. He reports that he does not currently use alcohol. He reports that he does not use drugs.  Current Medications:  Prior to Admission medications   Medication Sig Start Date End Date Taking? Authorizing Provider  acetaminophen (TYLENOL) 325 MG tablet Take 650 mg by mouth 3 (three) times daily.   Yes [provider]  Cholecalciferol (VITAMIN D3) 125 MCG (5000 UT) CAPS Take 1 capsule by mouth daily.   Yes [provider]  cyanocobalamin 1000 MCG tablet Take 1,000 mcg by mouth every other day.   Yes [provider]  doxycycline (VIBRA-TABS) 100 MG tablet Take 1 tablet (100 mg total) by mouth 2 (two) times daily. 09/14/22  Yes Tonia Ghent, MD  fludrocortisone (FLORINEF) 0.1 MG tablet Take 0.05-0.1 mg by mouth every other day.   Yes [provider]  gabapentin (NEURONTIN) 600 MG tablet Take 900 mg by mouth at bedtime.   Yes [provider]  hydrocortisone (CORTEF) 10 MG tablet Take 1 tablets ('15mg'$ ) by mouth every morning,  tablet ('5mg'$ ) by mouth daily at lunchtime and take  tablet ('5mg'$ ) by mouth every evening at dinner   Yes [provider]  levothyroxine (SYNTHROID) 75 MCG tablet Take 75 mcg by mouth daily.   Yes [provider]  metFORMIN (GLUCOPHAGE) 500 MG tablet Take 500 mg by mouth 2 (two) times daily with a meal.   Yes [provider]  mirabegron ER (MYRBETRIQ) 50 MG TB24 tablet Take 1 tablet (50 mg total) by mouth daily. 05/29/22  Yes Billey Co, MD  Multiple Vitamins-Minerals (PRESERVISION AREDS 2 PO) Take 1 tablet by mouth in the morning and at bedtime.   Yes [provider]  PSYLLIUM HUSK PO Take 3.4 g by mouth every evening.   Yes [provider]  colchicine 0.6 MG tablet Take 1 tablet (0.6 mg total)  by mouth as needed. First sign of Gout Flare. Take 2 tablets ( 1.'2mg'$ ) by mouth at first sign of gout flare followed by 1 tablet ( 0.'6mg'$ ) after 1 hour. ( max 1.'8mg'$   within 1 hours) 08/02/22   Lorella Nimrod, MD  cyclobenzaprine (FLEXERIL) 5 MG tablet Take 5 mg by mouth 3 (three) times daily. Patient not taking: Reported on 109-10-2021    [provider]    Allergies:  Allergies  Allergen Reactions   Other Rash    Blisters with bandaids Blisters with bandaids    Tegaderm Ag Mesh [Silver]    Tape Rash    Blisters, rash. Paper tape OK. Blisters, rash. Paper tape OK.     ROS:  General: Denies weight loss, weight gain, fatigue, fevers, chills, and night sweats. Eyes: Denies blurry vision, double vision, eye pain, itchy eyes, and tearing. Ears: Denies hearing loss, earache, and ringing in ears. Nose: Denies sinus pain, congestion, infections, runny nose, and nosebleeds. Mouth/throat: Denies hoarseness, sore throat, bleeding gums, and difficulty swallowing. Heart: Denies chest pain, palpitations, racing heart, irregular heartbeat, leg pain or swelling, and decreased activity tolerance. Respiratory: Denies breathing difficulty, shortness of breath, wheezing, cough, and sputum. GI: Denies change in appetite, heartburn, nausea, vomiting, constipation, diarrhea, and blood in stool. GU: Denies difficulty urinating, pain with urinating, urgency, frequency, blood in urine. Musculoskeletal: Denies joint stiffness, pain, swelling, muscle weakness. Skin: Denies rash, itching, mass, tumors, sores, and boils Neurologic: Denies headache, fainting, dizziness, seizures, numbness, and tingling. Psychiatric: Denies depression, anxiety, difficulty sleeping, and memory loss. Endocrine: Denies heat or cold intolerance, and increased thirst or urination. Blood/lymph: Denies easy bruising, easy bruising, and swollen glands     Objective:     BP 135/74 (BP Location: Left Arm)   Pulse 81   Temp 98.2 F (36.8 C) (Oral)   Resp 18   Ht '5\' 8"'$  (1.727 m)   Wt 93 kg   SpO2 95%   BMI 31.17 kg/m   Constitutional :  alert, cooperative, appears stated age,  fatigued, and no distress  Lymphatics/Throat:  no asymmetry, masses, or scars  Respiratory:  clear to auscultation bilaterally  Cardiovascular:  regular rate and rhythm  Gastrointestinal: soft, non-tender; bowel sounds normal; no masses,  no organomegaly.  Musculoskeletal: Steady gait and movement  Skin: Cool and moist, upper sternum area with obvious cellulitis and raised area with an open wound draining purulent material consistent with chest wall abscess.  Psychiatric: Normal affect, non-agitated, not confused       LABS:     Latest Ref Rng & Units 109-10-2021    9:59 AM 07/29/2022    9:05 AM 06/19/2022   10:45 AM  CMP  Glucose 70 - 99 mg/dL 174  164  181   BUN 8 - 23 mg/dL '22  24  24   '$ Creatinine 0.61 - 1.24 mg/dL 1.05  1.02  0.94   Sodium 135 - 145 mmol/L 134  137  134   Potassium 3.5 - 5.1 mmol/L 4.7  4.2  4.9   Chloride 98 - 111 mmol/L 101  103  103   CO2 22 - 32 mmol/L '22  25  25   '$ Calcium 8.9 - 10.3 mg/dL 9.2  9.3  8.8   Total Protein 6.5 - 8.1 g/dL 6.9  7.2  7.0   Total Bilirubin 0.3 - 1.2 mg/dL 0.9  0.9  0.6   Alkaline Phos 38 - 126 U/L 79  86  82   AST 15 -  41 U/L '24  21  25   '$ ALT 0 - 44 U/L '21  23  22       '$ Latest Ref Rng & Units 105-07-2022    9:59 AM 08/06/2022   11:12 AM 07/29/2022    9:05 AM  CBC  WBC 4.0 - 10.5 K/uL 9.4  8.9  6.4   Hemoglobin 13.0 - 17.0 g/dL 13.2  13.6  14.1   Hematocrit 39.0 - 52.0 % 41.1  40.8  43.1   Platelets 150 - 400 K/uL 326  296  284     RADS: CLINICAL DATA:  Anterior superior soft tissue wound in the region of the sternum   EXAM: ULTRASOUND OF CHEST SOFT TISSUES   TECHNIQUE: Ultrasound examination of the chest wall soft tissues was performed in the area of clinical concern.   COMPARISON:  None Available.   FINDINGS: Focused ultrasound exam of the superficial anterior chest wall was performed in the region of patient concern, near the sternum. This reveals a 2.5 x 1.0 x 2.6 cm complex fluid collection in  the subcutaneous tissues that appears to track superficially to the skin along its inferior margin. There is surrounding subcutaneous edema evident.   IMPRESSION: 2.5 x 1.0 x 2.6 cm complex fluid collection in the superficial anterior chest wall near the sternum, at the site of patient concern. This is nonspecific by ultrasound but imaging features could be compatible with abscess.   Subcutaneous edema noted in the adjacent regional soft tissues of the anterior chest wall.     Electronically Signed   By: Misty Stanley M.D.   On: 105-07-2022 17:14  Assessment:      Chest wall abscess  Plan:     Area actively draining purulent material through approximately 3 mm opening at the apex of likely infected epidermal cyst.  Gentle pressure applied circumferentially area to express additional purulent material along with caseous material consistent with infected epidermal cyst.  Once all material was drained area was irrigated with saline until clear return was noted.  Wound then dressed 4 x 4 gauze and secured with tape.  Hopefully the cellulitis will resolve now that the underlying abscess has been drained completely.  If drainage continues or cellulitis worsens, will consider bedside I&D to extend the opening for further evacuation.  Case discussed with admitting provider and she is in agreement. labs/images/medications/previous chart entries reviewed personally and relevant changes/updates noted above.

## 2022-09-18 NOTE — Consult Note (Incomplete)
Pharmacy Antibiotic Note  Tony Huffman is a 77 y.o. male admitted on 101-01-24 with cellulitis.  Pharmacy has been consulted for vancomycin dosing.  Assessment: 76 yo M presents with AMS. Pt had a cyst on his chest that was drained yesterday and they were unable to clean out the cyst fully, planning to go back in early January to get that done. Wife says pt has been on 3 rounds of doxycycline for a cough, last dispensed on 12/15. Imaging negative for acute chest findings. Pt is afebrile, VSS, on RA, and WBC WNL.  Plan: Initiate vancomycin 2 g IV x 1 as a load, followed by 1750 mg IV q24H Follow up culture results to assess for antibiotic optimization Monitor renal function to assess for any necessary antibiotic dosing changes   Height: '5\' 8"'$  (172.7 cm) Weight: 93 kg (205 lb) IBW/kg (Calculated) : 68.4  Temp (24hrs), Avg:98.2 F (36.8 C), Min:98.2 F (36.8 C), Max:98.3 F (36.8 C)  Recent Labs  Lab 107-11-23 0959  WBC 9.4  CREATININE 1.05    Estimated Creatinine Clearance: 65.2 mL/min (by C-G formula based on SCr of 1.05 mg/dL).    Allergies  Allergen Reactions   Other Rash    Blisters with bandaids Blisters with bandaids    Tegaderm Ag Mesh [Silver]    Tape Rash    Blisters, rash. Paper tape OK. Blisters, rash. Paper tape OK.     Antimicrobials this admission: 12/19 Vancomycin >>  *** *** >> ***  Dose adjustments this admission: ***  Microbiology results: *** BCx: *** *** UCx: ***  *** Sputum: ***  *** MRSA PCR: ***  Thank you for allowing pharmacy to be a part of this patient's care.  Delena Bali 101-01-24 5:16 PM

## 2022-09-18 NOTE — Assessment & Plan Note (Signed)
Recent A1c of 6.6 and he was on metformin at home. -SSI

## 2022-09-18 NOTE — ED Triage Notes (Signed)
First Nurse Note;  Pt via EMS from [facility].  Pt is normally able to stand and pivot but this AM he was more confused and weak per the wife. Pt had a cyst that was drained yesterday. States urine was foul smelling. Pt is A&Ox4 and NAD per EMS. 18G L hand  145/79 80 HR  16 RR  94% on RA 102 CBG  97.8

## 2022-09-18 NOTE — Assessment & Plan Note (Signed)
Patient with stage IV malignancy who is currently under hospice care as he does not want further management.  CT head with slight increase in brain mets with some vasogenic edema but no midline shift.  Worsening weakness. -PT/OT evaluation-recommending SNF

## 2022-09-18 NOTE — Assessment & Plan Note (Addendum)
S/P incision and drainage by PCP and he was placed on doxycycline. Soft tissue ultrasound with concern of abscess, may be in the plate drainage by PCP. -General surgery concern to see if they can do another incision and drainage. Preliminary cultures with no organism.  Blood cultures remain negative. -Patient received ceftriaxone and vancomycin in ED followed by continuation of ceftriaxone which was discontinued today after discussing with general surgery -Continue with doxycycline

## 2022-09-18 NOTE — ED Provider Triage Note (Signed)
  Emergency Medicine Provider Triage Evaluation Note  Greene Diodato , a 76 y.o.male,  was evaluated in triage.  Pt complains of weakness.  Patient states that when he woke up this morning, he had a sudden onset of headache and weakness.  He states that he was unable to get out of bed by himself.  He states that he feels weak and "disorganized".   Review of Systems  Positive: Weakness, headache Negative: Denies fever, chest pain, vomiting  Physical Exam  There were no vitals filed for this visit. Gen:   Awake, no distress   Resp:  Normal effort  MSK:   Moves extremities without difficulty  Other:    Medical Decision Making  Given the patient's initial medical screening exam, the following diagnostic evaluation has been ordered. The patient will be placed in the appropriate treatment space, once one is available, to complete the evaluation and treatment. I have discussed the plan of care with the patient and I have advised the patient that an ED physician or mid-level practitioner will reevaluate their condition after the test results have been received, as the results may give them additional insight into the type of treatment they may need.    Diagnostics: Labs, EKG, head CT  Treatments: none immediately   Teodoro Spray, [place] 115-Mar-2023 [phone]

## 2022-09-18 NOTE — H&P (Addendum)
History and Physical    Patient: Tony Huffman XBD:[id number] DOB: [birth year] DOA: 104/01/23 DOS: the patient was seen and examined on 104/01/23 PCP: Tonia Ghent, MD  Patient coming from: ALF/ILF  Chief Complaint:  Chief Complaint  Patient presents with   Weakness   HPI: Tony Huffman is a 76 y.o. 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  Patient was seen by PCP yesterday with a complaint of chest wall cellulitis and cyst for about 5 days, incision and drainage was done and he was discharged on doxycycline. This morning wife noticed that he is feeling more weak and requiring more assistance than his baseline so she called the EMS.  Patient denies any recent illnesses, had chronic mild cough with no recent change.  No fever or chills.  No chest pain.  No other upper respiratory symptoms.  No nausea, vomiting or diarrhea.  Appetite at baseline.  No urinary symptoms.  Patient has stopped treatment for his advanced malignancy.  Currently under palliative care and considering moving to hospice.  ED course.  Vital stable with unremarkable labs.  He received stress dose of steroid and blood cultures were drawn.  Also received a dose of ceftriaxone and vancomycin CT head with some progression of his brain metastatic lesion, no midline shift, mild vasogenic edema. Chest wall ultrasound with 2.5 x 1.0 x 2.6 cm complex fluid collection in the superficial anterior chest wall near the sternum, at the site of patient concern. This is nonspecific by ultrasound but imaging features could be compatible with abscess.  General surgery was also consulted for incision and drainage.  Review of Systems: As mentioned in the history of present illness. All other systems reviewed and are negative. Past Medical History:  Diagnosis Date   Adrenal  insufficiency (HCC)    Anxiety    Chronic kidney disease, stage 3 (HCC) 11/13/2011   Erectile dysfunction    GERD (gastroesophageal reflux disease)    Gout 09/04/2013   H/O atrial flutter 03/07/2017   Hyperlipidemia 06/16/2014   Hypertension    Hypothyroidism (acquired) 04/14/2014   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[place]) 11/10/2010   OSA (obstructive sleep apnea) 07/24/2016   Pancreatic mass 08/01/2018   Renal carcinoma ([place]) 06/16/2015   RLS (restless legs syndrome) 07/24/2016   Sleep apnea    Testicular hypofunction    Tussive syncopes 10/15/2013   Type 2 diabetes mellitus ([address])    Vitamin D deficiency disease    Past Surgical History:  Procedure Laterality Date   ADRENALECTOMY     CATARACT EXTRACTION  10/2014   CATARACT EXTRACTION EXTRACAPSULAR  11/05/2016   with Insertion Intraocular Prostheis.    COLONOSCOPY  05/06/2017   with removal lesions by snare.   CYSTECTOMY     Upper Neck/Chest Area, [facility]   NEPHRECTOMY     POPLITEAL SYNOVIAL CYST EXCISION     SPINE SURGERY  1989   THYROIDECTOMY     patient states they only removed half   TONSILLECTOMY     Social History:  reports that he quit smoking about 45 years ago. His smoking use included cigarettes. He has a 10.00 pack-year smoking history. He has been exposed to tobacco smoke. He has never used smokeless tobacco. He reports that he does not currently use alcohol. He reports that he does not use drugs.  Allergies  Allergen Reactions   Other Rash  Blisters with bandaids Blisters with bandaids    Tegaderm Ag Mesh [Silver]    Tape Rash    Blisters, rash. Paper tape OK. Blisters, rash. Paper tape OK.     Family History  Problem Relation Age of Onset   Ovarian cancer Mother    Cancer Mother    Cancer Father    Heart disease Father    Coronary artery disease Father    Hypertension Father    Macular degeneration Father    Heart disease Brother    Diabetes Brother    Coronary artery  disease Brother    Diabetes type II Brother    Coronary artery disease Paternal Grandfather     Prior to Admission medications   Medication Sig Start Date End Date Taking? Authorizing Provider  acetaminophen (TYLENOL) 325 MG tablet Take 650 mg by mouth 3 (three) times daily.    [provider]  Cholecalciferol (VITAMIN D3) 125 MCG (5000 UT) CAPS Take 1 capsule by mouth daily.    [provider]  colchicine 0.6 MG tablet Take 1 tablet (0.6 mg total) by mouth as needed. First sign of Gout Flare. Take 2 tablets ( 1.'2mg'$ ) by mouth at first sign of gout flare followed by 1 tablet ( 0.'6mg'$ ) after 1 hour. ( max 1.'8mg'$  within 1 hours) 08/02/22   Lorella Nimrod, MD  cyanocobalamin 1000 MCG tablet Take 1,000 mcg by mouth every other day.    [provider]  cyclobenzaprine (FLEXERIL) 5 MG tablet Take 5 mg by mouth 3 (three) times daily.    [provider]  doxycycline (VIBRA-TABS) 100 MG tablet Take 1 tablet (100 mg total) by mouth 2 (two) times daily. 09/14/22   Tonia Ghent, MD  fludrocortisone (FLORINEF) 0.1 MG tablet Take 0.5 mg by mouth every other day. Alternating with other listing    [provider]  gabapentin (NEURONTIN) 600 MG tablet Take 900 mg by mouth at bedtime.    [provider]  hydrocortisone (CORTEF) 10 MG tablet Take 1 tablets ('15mg'$ ) by mouth every morning,  tablet ('5mg'$ ) by mouth daily at lunchtime and take  tablet ('5mg'$ ) by mouth every evening at dinner    [provider]  levothyroxine (SYNTHROID) 75 MCG tablet Take 75 mcg by mouth daily.    [provider]  metFORMIN (GLUCOPHAGE) 500 MG tablet Take 500 mg by mouth 2 (two) times daily with a meal.    [provider]  mirabegron ER (MYRBETRIQ) 50 MG TB24 tablet Take 1 tablet (50 mg total) by mouth daily. 05/29/22   Billey Co, MD  Multiple Vitamins-Minerals (PRESERVISION AREDS 2 PO) Take 1 tablet by mouth in the morning and at bedtime.    [provider]  PSYLLIUM HUSK PO Take 3.4 g by mouth every evening.    [provider]    Physical Exam: Vitals:   103/01/23 0954 103/01/23 1313 103/01/23 1444  BP: 117/76 135/81 135/74  Pulse: 83 88 81  Resp: '18 18 18  '$ Temp: 98.3 F (36.8 C) 98.2 F (36.8 C) 98.2 F (36.8 C)  TempSrc:  Oral Oral  SpO2: 95% 94% 95%  Weight: 93 kg    Height: '5\' 8"'$  (1.727 m)      General: Vital signs reviewed.  Patient is well-developed and well-nourished, in no acute distress and cooperative with exam.  Head: Normocephalic and atraumatic. Eyes: EOMI, conjunctivae normal, no scleral icterus.  Neck: Supple, trachea midline, normal ROM,  Cardiovascular: RRR, S1 normal, S2 normal,  no murmurs, gallops, or rubs. Pulmonary/Chest: Clear to auscultation bilaterally, no wheezes, rales, or rhonchi. Abdominal: Soft, non-tender, non-distended, BS +,  Extremities: No lower extremity edema bilaterally,  pulses symmetric and intact bilaterally. No cyanosis or clubbing. Neurological: A&O x3, Strength is normal and symmetric bilaterally, cranial nerve II-XII are grossly intact, no focal motor deficit, sensory intact to light touch bilaterally.  Skin: Anterior chest wall with a draining lesion, with surrounding erythema and edema. Psychiatric: Normal mood and affect.   Data Reviewed: Prior data reviewed as mentioned above  Assessment and Plan: * Generalized weakness Patient with stage IV malignancy who is currently under hospice care as he does not want further management.  CT head with slight increase in brain mets with some vasogenic edema but no midline shift.  Worsening weakness. -PT/OT evaluation  Cellulitis of chest wall 2 S/P incision and drainage by PCP and he was placed on doxycycline. Soft tissue ultrasound with concern of abscess, may be in the plate drainage by PCP. -General surgery concern to see if they can do another incision and drainage. -Patient received a dose of  ceftriaxone -Continue with ceftriaxone.  Addisons disease ([place]) -Continue home Florinef and Cortef   Goals of care, counseling/discussion Patient has declined further management and is considering moving on with hospice.   Hypothyroidism (acquired) -Continue home Synthroid  Diabetes mellitus without complication (HCC) Recent A1c of 6.6 and he was on metformin at home. -SSI    Advance Care Planning:   Code Status: DNR discussed with patient and wife  Consults: General surgery    Family Communication: Discussed with wife at bedside  Severity of Illness:  admitted as observation  This record has been created using Systems analyst. Errors have been sought and corrected,but may not always be located. Such creation errors do not reflect on the standard of care.    Author: Lorella Nimrod, MD 111-07-23 5:56 PM  For on call review [email or web address].

## 2022-09-18 NOTE — Consult Note (Signed)
PHARMACY -  BRIEF ANTIBIOTIC NOTE   Pharmacy has received consult(s) for vancomycin from an ED provider.  The patient's profile has been reviewed for ht/wt/allergies/indication/available labs.    One time order(s) placed for Vancomycin 2000 mg IV x 1  Further antibiotics/pharmacy consults should be ordered by admitting physician if indicated.                       Thank you,  Will M. Ouida Sills, PharmD PGY-1 Pharmacy Resident 119-Dec-2023 5:25 PM

## 2022-09-18 NOTE — Assessment & Plan Note (Signed)
-  Continue home Florinef and Cortef

## 2022-09-18 NOTE — Assessment & Plan Note (Signed)
-   Continue home Synthroid °

## 2022-09-18 NOTE — ED Notes (Signed)
Surgery at bedside, drained pt's open abscess manually.

## 2022-09-19 DIAGNOSIS — M6281 Muscle weakness (generalized): Secondary | ICD-10-CM | POA: Diagnosis not present

## 2022-09-19 DIAGNOSIS — L089 Local infection of the skin and subcutaneous tissue, unspecified: Secondary | ICD-10-CM | POA: Insufficient documentation

## 2022-09-19 DIAGNOSIS — R531 Weakness: Secondary | ICD-10-CM | POA: Diagnosis not present

## 2022-09-19 LAB — GLUCOSE, CAPILLARY
Glucose-Capillary: 144 mg/dL — ABNORMAL HIGH (ref 70–99)
Glucose-Capillary: 147 mg/dL — ABNORMAL HIGH (ref 70–99)
Glucose-Capillary: 216 mg/dL — ABNORMAL HIGH (ref 70–99)
Glucose-Capillary: 204 mg/dL — ABNORMAL HIGH (ref 70–99)

## 2022-09-19 MED ORDER — DIPHENHYDRAMINE HCL 12.5 MG/5ML PO ELIX
12.5000 mg | ORAL_SOLUTION | Freq: Once | ORAL | Status: AC
  Administered 2022-09-19: 12.5 mg via ORAL
  Filled 2022-09-19 (×2): qty 5

## 2022-09-19 MED ORDER — ENOXAPARIN SODIUM 60 MG/0.6ML IJ SOSY
0.5000 mg/kg | PREFILLED_SYRINGE | INTRAMUSCULAR | Status: DC
  Administered 2022-09-19: 47.5 mg via SUBCUTANEOUS
  Filled 2022-09-19: qty 0.6

## 2022-09-19 NOTE — NC FL2 (Signed)
[place] LEVEL OF CARE FORM     IDENTIFICATION  Patient Name: Tony Huffman Birthdate: 04-16-1946 Sex: male Admission Date (Current Location): 108-08-23  [facility] and [place] Number:  Engineering geologist and Address:  [facility], [address], [place], [place] [address]      Provider Number: [id number]  Attending Physician Name and Address:  Lorella Nimrod, MD  Relative Name and Phone Number:  Riccardo Holeman- spouse- [phone]    Current Level of Care: Hospital Recommended Level of Care: [place] Prior Approval Number:    Date Approved/Denied:   PASRR Number: [id number]  Discharge Plan: SNF    Current Diagnoses: Patient Active Problem List   Diagnosis Date Noted   Generalized weakness 108-08-23   Cellulitis of chest wall 108-08-23   Benign prostatic hyperplasia with urinary frequency 08/03/2022   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   Left-sided muscle weakness    Renal cell carcinoma, left ([place]) 06/19/2022   Other abnormal tumor markers 06/19/2022   Goals of care, counseling/discussion 06/19/2022   Advance care planning 05/27/2022   Gait abnormality 05/27/2022   Diabetes mellitus without complication ([place]) 25/36/6440   Addisons disease ([place]) 03/16/2021   Low HDL (under 40) 03/16/2021   Numbness and tingling 11/21/2020   Pancreatic mass 08/01/2018   H/O sebaceous cyst 01/06/2018   Edema leg 03/07/2017   OSA (obstructive sleep apnea) 07/24/2016   RLS (restless legs syndrome) 07/24/2016   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   Atrial fibrillation and flutter ([place]) 10/20/2014   Hyperlipidemia 06/16/2014   Cough 03/29/2014   Cough syncope 10/15/2013   Dyslipidemia 09/04/2013   Gout 09/04/2013   Chronic kidney disease, stage III (moderate) (HCC) 11/13/2011   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   Hypothyroidism (acquired) 11/30/2010   Malignant neoplasm of  adrenal gland ([place]) 11/10/2010    Orientation RESPIRATION BLADDER Height & Weight     Self, Time, Situation, Place  Normal External catheter Weight: 205 lb (93 kg) Height:  '5\' 8"'$  (172.7 cm)  BEHAVIORAL SYMPTOMS/MOOD NEUROLOGICAL BOWEL NUTRITION STATUS      Continent Diet (Carb Modified)  AMBULATORY STATUS COMMUNICATION OF NEEDS Skin   Extensive Assist Verbally Other (Comment) (dry/redness)                       Personal Care Assistance Level of Assistance  Dressing, Feeding, Bathing Bathing Assistance: Maximum assistance Feeding assistance: Maximum assistance Dressing Assistance: Maximum assistance     Functional Limitations Info  Sight, Hearing, Speech Sight Info: Impaired Hearing Info: Adequate Speech Info: Adequate    SPECIAL CARE FACTORS FREQUENCY  OT (By licensed OT), PT (By licensed PT)     PT Frequency: 5x a week OT Frequency: 5x a week            Contractures Contractures Info: Not present    Additional Factors Info  Code Status Code Status Info: DNR             Current Medications (09/19/2022):  This is the current hospital active medication list Current Facility-Administered Medications  Medication Dose Route Frequency Provider Last Rate Last Admin   acetaminophen (TYLENOL) tablet 650 mg  650 mg Oral Q6H PRN Lorella Nimrod, MD   650 mg at 09/19/22 3474   Or   acetaminophen (TYLENOL) suppository 650 mg  650 mg Rectal Q6H PRN Lorella Nimrod, MD       cyanocobalamin (VITAMIN B12) tablet 1,000 mcg  1,000 mcg  Oral Lanelle Bal, MD       doxycycline (VIBRA-TABS) tablet 100 mg  100 mg Oral BID Lorella Nimrod, MD   100 mg at 09/19/22 0920   enoxaparin (LOVENOX) injection 47.5 mg  0.5 mg/kg Subcutaneous Q24H Wynelle Cleveland, RPH       [START ON 09/20/2022] fludrocortisone (FLORINEF) tablet 0.05 mg  0.05 mg Oral Silvestre Gunner, Soundra Pilon, MD       fludrocortisone (FLORINEF) tablet 0.1 mg  0.1 mg Oral Silvestre Gunner, Soundra Pilon, MD   0.1 mg at 09/19/22 2263    hydrocortisone (CORTEF) tablet 15 mg  15 mg Oral QAC breakfast Lorella Nimrod, MD   15 mg at 09/19/22 3354   hydrocortisone (CORTEF) tablet 5 mg  5 mg Oral BID AC Amin, Soundra Pilon, MD   5 mg at 09/19/22 1238   insulin aspart (novoLOG) injection 0-5 Units  0-5 Units Subcutaneous QHS Lorella Nimrod, MD   3 Units at 12023/03/12 2141   insulin aspart (novoLOG) injection 0-9 Units  0-9 Units Subcutaneous TID WC Lorella Nimrod, MD   1 Units at 09/19/22 1239   levothyroxine (SYNTHROID) tablet 75 mcg  75 mcg Oral Daily Lorella Nimrod, MD   75 mcg at 09/19/22 0508   ondansetron (ZOFRAN) tablet 4 mg  4 mg Oral Q6H PRN Lorella Nimrod, MD       Or   ondansetron (ZOFRAN) injection 4 mg  4 mg Intravenous Q6H PRN Lorella Nimrod, MD       polyethylene glycol (MIRALAX / GLYCOLAX) packet 17 g  17 g Oral Daily PRN Lorella Nimrod, MD         Discharge Medications: Please see discharge summary for a list of discharge medications.  Relevant Imaging Results:  Relevant Lab Results:   Additional Information SSN: [id number]  Colen Darling, LCSWA

## 2022-09-19 NOTE — Assessment & Plan Note (Signed)
See above, continue with warm compresses and doxycycline and update me as needed.

## 2022-09-19 NOTE — Hospital Course (Addendum)
Tony Huffman is a 76 y.o. 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   Patient was seen by PCP yesterday with a complaint of chest wall cellulitis and cyst for about 5 days, incision and drainage was done and he was discharged on doxycycline. This morning wife noticed that he is feeling more weak and requiring more assistance than his baseline so she called the EMS.   Patient has stopped treatment for his advanced malignancy.  Currently under palliative care and considering moving to hospice.  ED course.  Vital stable with unremarkable labs.  He received stress dose of steroid and blood cultures were drawn.  Also received a dose of ceftriaxone and vancomycin CT head with some progression of his brain metastatic lesion, no midline shift, mild vasogenic edema. Chest wall ultrasound with 2.5 x 1.0 x 2.6 cm complex fluid collection in the superficial anterior chest wall near the sternum, at the site of patient concern. This is nonspecific by ultrasound but imaging features could be compatible with abscess.  General surgery was consulted and patient underwent another incision and drainage at bedside.  12/20: Vitals remained stable.  Preliminary wound cultures with no organisms seen. Preliminary blood cultures negative in 24 hours.  COVID-19, influenza and RSV PCR negative.  Surgery placed their recommendations for discharge. PT recommended SNF.  Patient is from [facility] [place] and can go to their skilled nursing facility for rehab.  By the time we obtained insurance authorization it was late for the facility to arrange his medication so they were requesting early morning discharge.  Patient will be high risk for deterioration and mortality based on life limiting underlying comorbidities.  Currently under outpatient palliative care and would like to go under  hospice care after getting some rehab.

## 2022-09-19 NOTE — Assessment & Plan Note (Signed)
With balance affected.  Discussed.  He is going to check with physical therapy and then update me.

## 2022-09-19 NOTE — Progress Notes (Signed)
PHARMACIST - PHYSICIAN COMMUNICATION  CONCERNING:  Enoxaparin (Lovenox) for DVT Prophylaxis    RECOMMENDATION: Patient was prescribed enoxaprin '40mg'$  q24 hours for VTE prophylaxis.   Filed Weights   12023-01-26 0954  Weight: 93 kg (205 lb)    Body mass index is 31.17 kg/m.  Estimated Creatinine Clearance: 65.2 mL/min (by C-G formula based on SCr of 1.05 mg/dL).   Based on [place] patient is candidate for enoxaparin 0.'5mg'$ /kg TBW SQ every 24 hours based on BMI being >30.   DESCRIPTION: Pharmacy has adjusted enoxaparin dose per [facility] policy.  Patient is now receiving enoxaparin 0.5 mg/kg every 24 hours    Glean Salvo, PharmD, BCPS Clinical Pharmacist  09/19/2022 12:41 PM

## 2022-09-19 NOTE — Progress Notes (Signed)
  Transition of Care [facility]) Screening Note   Patient Details  Name: Tony Huffman Date of Birth: [birth year]   Transition of Care [facility]) CM/SW Contact:    Quin Hoop, LCSW Phone Number: 09/19/2022, 9:27 AM    Transition of Care Department [facility]) has reviewed patient and no TOC needs have been identified at this time. We will continue to monitor patient advancement through interdisciplinary progression rounds. If new patient transition needs arise, please place a TOC consult.

## 2022-09-19 NOTE — Progress Notes (Signed)
Subjective:  CC: Tony Huffman is a 76 y.o. male  Hospital stay day 0,   chest wall abscess  HPI: No acute issues overnight  ROS:  General: Denies weight loss, weight gain, fatigue, fevers, chills, and night sweats. Heart: Denies chest pain, palpitations, racing heart, irregular heartbeat, leg pain or swelling, and decreased activity tolerance. Respiratory: Denies breathing difficulty, shortness of breath, wheezing, cough, and sputum. GI: Denies change in appetite, heartburn, nausea, vomiting, constipation, diarrhea, and blood in stool. GU: Denies difficulty urinating, pain with urinating, urgency, frequency, blood in urine.   Objective:   Temp:  [97.8 F (36.6 C)-98.5 F (36.9 C)] 97.8 F (36.6 C) (12/20 0644) Pulse Rate:  [75-88] 80 (12/20 0644) Resp:  [16-20] 20 (12/20 0644) BP: (117-135)/(72-81) 124/75 (12/20 0644) SpO2:  [94 %-96 %] 95 % (12/20 0644) Weight:  [93 kg] 93 kg (12/19 0954)     Height: '5\' 8"'$  (172.7 cm) Weight: 93 kg BMI (Calculated): 31.18   Intake/Output this shift:   Intake/Output Summary (Last 24 hours) at 09/19/2022 0835 Last data filed at 09/19/2022 6734 Gross per 24 hour  Intake 1000 ml  Output 1200 ml  Net -200 ml    Constitutional :  alert, cooperative, appears stated age, and no distress  Respiratory:  clear to auscultation bilaterally  Cardiovascular:  regular rate and rhythm  Gastrointestinal: soft, non-tender; bowel sounds normal; no masses,  no organomegaly.   Skin: Cool and moist.  Chest wall abscess with continued scant purulent drainage.  Erythema surrounding the opening remained stable, less tenderness to palpation  Psychiatric: Normal affect, non-agitated, not confused       LABS:     Latest Ref Rng & Units 110-29-23    9:59 AM 07/29/2022    9:05 AM 06/19/2022   10:45 AM  CMP  Glucose 70 - 99 mg/dL 174  164  181   BUN 8 - 23 mg/dL '22  24  24   '$ Creatinine 0.61 - 1.24 mg/dL 1.05  1.02  0.94   Sodium 135 - 145 mmol/L 134  137  134    Potassium 3.5 - 5.1 mmol/L 4.7  4.2  4.9   Chloride 98 - 111 mmol/L 101  103  103   CO2 22 - 32 mmol/L '22  25  25   '$ Calcium 8.9 - 10.3 mg/dL 9.2  9.3  8.8   Total Protein 6.5 - 8.1 g/dL 6.9  7.2  7.0   Total Bilirubin 0.3 - 1.2 mg/dL 0.9  0.9  0.6   Alkaline Phos 38 - 126 U/L 79  86  82   AST 15 - 41 U/L '24  21  25   '$ ALT 0 - 44 U/L '21  23  22       '$ Latest Ref Rng & Units 110-29-23    9:59 AM 08/06/2022   11:12 AM 07/29/2022    9:05 AM  CBC  WBC 4.0 - 10.5 K/uL 9.4  8.9  6.4   Hemoglobin 13.0 - 17.0 g/dL 13.2  13.6  14.1   Hematocrit 39.0 - 52.0 % 41.1  40.8  43.1   Platelets 150 - 400 K/uL 326  296  284     RADS: N/a Assessment:   Chest wall abscess spontaneously draining.  Expressed some purulent drainage again today at bedside there is a patent opening.  Wound then packed with corner of 4 x 4 gauze and secured with paper tape.  Recommend continuing daily wet-to-dry dressing changes.  No further  I&D needed at this point.  Continue antibiotics for now.  Okay to follow-up as outpatient regarding his abscess, if no other medical issues requiring inpatient treatment.  Finish antibiotic course with oral meds.   labs/images/medications/previous chart entries reviewed personally and relevant changes/updates noted above.

## 2022-09-19 NOTE — Progress Notes (Signed)
Progress Note   Patient: Tony Huffman UXY:[id number] DOB: 1946-04-20 DOA: 12023-08-24     0 DOS: the patient was seen and examined on 09/19/2022   Brief hospital course: Tony Huffman is a 76 y.o. 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   Patient was seen by PCP yesterday with a complaint of chest wall cellulitis and cyst for about 5 days, incision and drainage was done and he was discharged on doxycycline. This morning wife noticed that he is feeling more weak and requiring more assistance than his baseline so she called the EMS.   Patient has stopped treatment for his advanced malignancy.  Currently under palliative care and considering moving to hospice.  ED course.  Vital stable with unremarkable labs.  He received stress dose of steroid and blood cultures were drawn.  Also received a dose of ceftriaxone and vancomycin CT head with some progression of his brain metastatic lesion, no midline shift, mild vasogenic edema. Chest wall ultrasound with 2.5 x 1.0 x 2.6 cm complex fluid collection in the superficial anterior chest wall near the sternum, at the site of patient concern. This is nonspecific by ultrasound but imaging features could be compatible with abscess.  General surgery was consulted and patient underwent another incision and drainage at bedside.  12/20: Vitals remained stable.  Preliminary wound cultures with no organisms seen. Preliminary blood cultures negative in 24 hours.  COVID-19, influenza and RSV PCR negative.  Surgery placed their recommendations for discharge. PT recommended SNF.  Patient is from [facility] [place] and can go to their skilled nursing facility for rehab.  By the time we obtained insurance authorization it was late for the facility to arrange his medication so they were requesting early morning  discharge.  Patient will be high risk for deterioration and mortality based on life limiting underlying comorbidities.  Currently under outpatient palliative care and would like to go under hospice care after getting some rehab.   Assessment and Plan: * Generalized weakness Patient with stage IV malignancy who is currently under hospice care as he does not want further management.  CT head with slight increase in brain mets with some vasogenic edema but no midline shift.  Worsening weakness. -PT/OT evaluation-recommending SNF  Cellulitis of chest wall S/P incision and drainage by PCP and he was placed on doxycycline. Soft tissue ultrasound with concern of abscess, may be in the plate drainage by PCP. -General surgery concern to see if they can do another incision and drainage. Preliminary cultures with no organism.  Blood cultures remain negative. -Patient received ceftriaxone and vancomycin in ED followed by continuation of ceftriaxone which was discontinued today after discussing with general surgery -Continue with doxycycline  Addisons disease ([place]) -Continue home [facility] [place]   Goals of care, counseling/discussion Patient has declined further management and is considering moving on with hospice.   Hypothyroidism (acquired) -Continue home Synthroid  Diabetes mellitus without complication (HCC) Recent A1c of 6.6 and he was on metformin at home. -SSI   Subjective: Patient was seen and examined today.  No new concern.  Wife is concerned that he is becoming more weak each day.  She understand his disease trajectory but was concerned that she might not be able to take care of him without extra help.  Physical Exam: Vitals:   09/19/22 0120 09/19/22 0644 09/19/22 0840 09/19/22 1657  BP:  124/75 114/71 Marland Kitchen)  145/78  Pulse: 78 80 75 79  Resp:  '20 20 16  '$ Temp:  97.8 F (36.6 C) 97.6 F (36.4 C) 98.5 F (36.9 C)  TempSrc:   Oral   SpO2: 95% 95% 95% 98%  Weight:       Height:       General.  Frail elderly man, in no acute distress. Pulmonary.  Lungs clear bilaterally, normal respiratory effort. CV.  Regular rate and rhythm, no JVD, rub or murmur. Abdomen.  Soft, nontender, nondistended, BS positive. CNS.  Alert and oriented .  No focal neurologic deficit. Extremities.  No edema, no cyanosis, pulses intact and symmetrical. Psychiatry.  Judgment and insight appears normal.   Data Reviewed: Prior data reviewed  Family Communication: Discussed with wife at bedside  Disposition: Status is: Observation The patient remains OBS appropriate and will d/c before 2 midnights.  Planned Discharge Destination: Skilled nursing facility  Time spent: 45 minutes  This record has been created using Systems analyst. Errors have been sought and corrected,but may not always be located. Such creation errors do not reflect on the standard of care.   Author: Lorella Nimrod, MD 09/19/2022 5:05 PM  For on call review [email or web address].

## 2022-09-19 NOTE — Evaluation (Signed)
Physical Therapy Evaluation Patient Details Name: Tony Huffman MRN: [id number] DOB: Feb 11, 1946 Today's Date: 09/19/2022  History of Present Illness  76 y/o male presented to ED on 108-06-2022 for weakness and confusion. Recent cyst drainage in chest at PCP. Admitted for chest wall abscess and fluid collection in anterior chest wall. PMH: CKD, HTN, OSA, pancreatic mass, renal carcinoma with brain mets, T2DM  Clinical Impression  Patient admitted with the above. PTA, patient lives at [facility] with wife who has recently been providing increasing assistance for transfers and hx of numerous falls. Wife reports varying assistance dependent on day and fatigue level of patient. Patient presents with weakness, impaired balance, and decreased activity tolerance. Able to complete bed mobility with increased time, use of bed rails, and min guard. Able to stand from low bed surface and recliner with min guard +2 for safety and RW. Ambulated short distances in room with min guard +2 for safety and RW with one instance of mild L knee buckling but able to self correct. Wife understanding of current prognosis of diagnosis but wanting to discharge to rehab to see if able to gain or maintain current strength level. Patient will benefit from skilled PT services during acute stay to address listed deficits. Recommend SNF at discharge.      Recommendations for follow up therapy are one component of a multi-disciplinary discharge planning process, led by the attending physician.  Recommendations may be updated based on patient status, additional functional criteria and insurance authorization.  Follow Up Recommendations Skilled nursing-short term rehab (<3 hours/day) (at [facility]) Can patient physically be transported by private vehicle: No    Assistance Recommended at Discharge Frequent or constant Supervision/Assistance  Patient can return home with the following  A lot of help with walking and/or transfers;A lot  of help with bathing/dressing/bathroom;Assistance with cooking/housework;Direct supervision/assist for medications management;Direct supervision/assist for financial management;Assist for transportation;Help with stairs or ramp for entrance    Equipment Recommendations None recommended by PT  Recommendations for Other Services       Functional Status Assessment Patient has had a recent decline in their functional status and demonstrates the ability to make significant improvements in function in a reasonable and predictable amount of time.     Precautions / Restrictions Precautions Precautions: Fall Restrictions Weight Bearing Restrictions: No      Mobility  Bed Mobility Overal bed mobility: Needs Assistance Bed Mobility: Supine to Sit     Supine to sit: Min guard, +2 for safety/equipment     General bed mobility comments: + use of bed rails. Increased time to complete but no physical assistance required    Transfers Overall transfer level: Needs assistance Equipment used: Rolling Sarrah Fiorenza (2 wheels) Transfers: Sit to/from Stand Sit to Stand: Min guard, +2 safety/equipment           General transfer comment: able to come into standing from low bed surface and from recliner x 5 during session. Cues for hand placement    Ambulation/Gait Ambulation/Gait assistance: Min guard, +2 safety/equipment Gait Distance (Feet): 4 Feet (+6') Assistive device: Rolling Kareen Jefferys (2 wheels) Gait Pattern/deviations: Step-to pattern, Decreased stride length, Knees buckling Gait velocity: decreased     General Gait Details: Difficulty advancing L foot at times due to weakness. Able to take steps towards recliner then able to take fwd/bwd steps with cues for increasing step length. One instance of mild L knee buckling but able to self correct  Stairs  Wheelchair Mobility    Modified Rankin (Stroke Patients Only)       Balance Overall balance assessment: Needs  assistance, History of Falls Sitting-balance support: No upper extremity supported, Feet supported Sitting balance-Leahy Scale: Fair     Standing balance support: Bilateral upper extremity supported, Reliant on assistive device for balance Standing balance-Leahy Scale: Poor Standing balance comment: reliant on RW for support                             Pertinent Vitals/Pain Pain Assessment Pain Assessment: No/denies pain    Home Living Family/patient expects to be discharged to:: Private residence Living Arrangements: Spouse/significant other Available Help at Discharge: Family;Available 24 hours/day Type of Home: [place]: Rollator (4 wheels);Standard Javonnie Illescas;Cane - single point;Transport chair;Grab bars - toilet;Grab bars - tub/shower;Tub bench;Shower seat      Prior Function Prior Level of Function : Needs assist;History of Falls (last six months)             Mobility Comments: hx of falls. Uses rollator for very short distances. Recently has been using transport primarily with wife assistance to transfer. Wife reports varying assistance dependent on day and fatigue level ADLs Comments: wife assists with ADLs recently     Hand Dominance        Extremity/Trunk Assessment   Upper Extremity Assessment Upper Extremity Assessment: Defer to OT evaluation    Lower Extremity Assessment Lower Extremity Assessment: Generalized weakness (L > R)    Cervical / Trunk Assessment Cervical / Trunk Assessment: Kyphotic  Communication   Communication: No difficulties  Cognition Arousal/Alertness: Awake/alert Behavior During Therapy: WFL for tasks assessed/performed Overall Cognitive Status: History of cognitive impairments - at baseline                                 General Comments: hx of cognitive deficits 2/2 brain mets but otherwise WFL during mobility tasks        General Comments       Exercises     Assessment/Plan    PT Assessment Patient needs continued PT services  PT Problem List Decreased strength;Decreased activity tolerance;Decreased mobility;Decreased balance;Decreased coordination;Decreased cognition;Decreased knowledge of use of DME;Decreased safety awareness;Decreased knowledge of precautions       PT Treatment Interventions DME instruction;Gait training;Functional mobility training;Therapeutic activities;Therapeutic exercise;Balance training;Patient/family education    PT Goals (Current goals can be found in the Care Plan section)  Acute Rehab PT Goals Patient Stated Goal: did not state PT Goal Formulation: With patient/family Time For Goal Achievement: 10/03/22 Potential to Achieve Goals: Poor    Frequency Min 2X/week     Co-evaluation PT/OT/SLP Co-Evaluation/Treatment: Yes Reason for Co-Treatment: For patient/therapist safety;To address functional/ADL transfers PT goals addressed during session: Mobility/safety with mobility;Balance         AM-PAC PT "6 Clicks" Mobility  Outcome Measure Help needed turning from your back to your side while in a flat bed without using bedrails?: Total Help needed moving from lying on your back to sitting on the side of a flat bed without using bedrails?: Total Help needed moving to and from a bed to a chair (including a wheelchair)?: Total Help needed standing up from a chair using your arms (e.g., wheelchair or bedside chair)?: Total Help needed to walk in hospital room?: Total Help needed climbing 3-5  steps with a railing? : Total 6 Click Score: 6    End of Session Equipment Utilized During Treatment: Gait belt Activity Tolerance: Patient tolerated treatment well Patient left: in chair;with call bell/phone within reach;with family/visitor present Nurse Communication: Mobility status PT Visit Diagnosis: Unsteadiness on feet (R26.81);Muscle weakness (generalized) (M62.81);History of falling (Z91.81);Other  abnormalities of gait and mobility (R26.89)    Time: 4514-6047 PT Time Calculation (min) (ACUTE ONLY): 27 min   Charges:   PT Evaluation $PT Eval Moderate Complexity: 1 Mod          [facility]ez A. Gilford Rile PT, DPT [facility] - Acute Rehabilitation Services   Linna Hoff 09/19/2022, 12:47 PM

## 2022-09-19 NOTE — Progress Notes (Addendum)
CSW left message for Tony Huffman, about pt returning back home and receiving rehab.  Per Dr. Reesa Chew, he lives at [facility] and he has rec'd rehab from there.  CSW waiting on return call from [place].  12:52  Per Seth Bake, he can come back and admit for rehab, however, he would need a 3 day waiver because pt is in observation status. Pt would also need a new FL2.

## 2022-09-19 NOTE — Evaluation (Signed)
Occupational Therapy Evaluation Patient Details Name: Tony Huffman MRN: [id number] DOB: 09-23-1946 Today's Date: 09/19/2022   History of Present Illness 76 y/o male presented to ED on 106-07-23 for weakness and confusion. Recent cyst drainage in chest at PCP. Admitted for chest wall abscess and fluid collection in anterior chest wall. PMH: CKD, HTN, OSA, pancreatic mass, renal carcinoma with brain mets, T2DM   Clinical Impression   Pt agreeable to OT/PT co-treatment to maximize safety and participation. Wife present. Pt presenting with decreased independence in self care, balance, functional mobility/transfers, and endurance. Prior to admission, pt lived with wife at [place]. Wife provides assistance for ADLs PRN, all IADLs, and functional mobility (primarily uses transport chair, rollator for short household distances). Pt currently functioning at [place] guard +2 (safety) for all mobility including supine to sit, STS from EOB/recliner, and for taking steps toward recliner chair using RW. He required Min A for UB dressing. Anticipate increased assistance for standing ADL tasks 2/2 requiring BUE support from RW for balance. Pt will benefit from acute OT to increase overall independence in the areas of ADLs and functional mobility in order to safely discharge home. Upon hospital discharge, recommend STR to maximize pt safety and return to PLOF.    Recommendations for follow up therapy are one component of a multi-disciplinary discharge planning process, led by the attending physician.  Recommendations may be updated based on patient status, additional functional criteria and insurance authorization.   Follow Up Recommendations  Skilled nursing-short term rehab (<3 hours/day)     Assistance Recommended at Discharge Frequent or constant Supervision/Assistance  Patient can return home with the following A lot of help with walking and/or transfers;A lot of help with  bathing/dressing/bathroom;Assistance with cooking/housework;Assist for transportation;Help with stairs or ramp for entrance;Direct supervision/assist for medications management    Functional Status Assessment  Patient has had a recent decline in their functional status and demonstrates the ability to make significant improvements in function in a reasonable and predictable amount of time.  Equipment Recommendations  Other (comment) (defer to next venue of care)    Recommendations for Other Services       Precautions / Restrictions Precautions Precautions: Fall Restrictions Weight Bearing Restrictions: No      Mobility Bed Mobility Overal bed mobility: Needs Assistance Bed Mobility: Supine to Sit     Supine to sit: Min guard, +2 for safety/equipment     General bed mobility comments: use of bed rails, increased time/effort    Transfers Overall transfer level: Needs assistance Equipment used: Rolling walker (2 wheels) Transfers: Sit to/from Stand Sit to Stand: Min guard, +2 safety/equipment           General transfer comment: STS from EOB and recliner (5x)      Balance Overall balance assessment: Needs assistance, History of Falls Sitting-balance support: No upper extremity supported, Feet supported Sitting balance-Leahy Scale: Fair     Standing balance support: Bilateral upper extremity supported, Reliant on assistive device for balance Standing balance-Leahy Scale: Poor                             ADL either performed or assessed with clinical judgement   ADL Overall ADL's : Needs assistance/impaired                 Upper Body Dressing : Minimal assistance;Bed level Upper Body Dressing Details (indicate cue type and reason): to doff t-shirt & don hospital gown  Toilet Transfer: Min guard;+2 for safety/equipment;Rolling walker (2 wheels) Toilet Transfer Details (indicate cue type and reason): simulated with short ambulatory transfer  from EOB>recliner         Functional mobility during ADLs: Min guard;+2 for safety/equipment;Rolling walker (2 wheels) (to take ~4 steps toward recliner) General ADL Comments: anticipate increased assistance for standing ADL tasks 2/2 requiring BUE support from RW for balance     Vision Baseline Vision/History: 1 Wears glasses Patient Visual Report: No change from baseline       Perception     Praxis      Pertinent Vitals/Pain Pain Assessment Pain Assessment: No/denies pain     Hand Dominance Right   Extremity/Trunk Assessment Upper Extremity Assessment Upper Extremity Assessment: Generalized weakness   Lower Extremity Assessment Lower Extremity Assessment: Generalized weakness   Cervical / Trunk Assessment Cervical / Trunk Assessment: Kyphotic   Communication Communication Communication: No difficulties   Cognition Arousal/Alertness: Awake/alert Behavior During Therapy: WFL for tasks assessed/performed Overall Cognitive Status: History of cognitive impairments - at baseline                                 General Comments: hx of cognitive deficits 2/2 brain mets but otherwise [facility]      General Comments       Exercises Other Exercises Other Exercises: OT provided education re: role of OT, OT POC, post acute recs, sitting up for all meals, EOB/OOB mobility with assistance, home/fall safety.     Shoulder Instructions      Home Living Family/patient expects to be discharged to:: Private residence Living Arrangements: Spouse/significant other Available Help at Discharge: Family;Available 24 hours/day Type of Home: Independent living facility Home Access: Level entry     Home Layout: One level     Bathroom Shower/Tub: Teacher, early years/pre: Handicapped height     Home Equipment: Rollator (4 wheels);Standard Walker;Cane - single point;Transport chair;Grab bars - toilet;Grab bars - tub/shower;Tub bench;Shower seat   Additional  Comments: Wife reports aide comes on Monday mornings, but need more support than this at home.      Prior Functioning/Environment Prior Level of Function : Needs assist;History of Falls (last six months)             Mobility Comments: hx of falls. Uses rollator for very short distances. Recently has been using transport chair primarily with wife assistance to transfer. Wife reports varying assistance dependent on day and fatigue level ADLs Comments: wife assists with ADLs PRN (assist for shower transfer but then can bathe self per pt), all IADLs, & provides transportation        OT Problem List: Decreased strength;Decreased activity tolerance;Impaired balance (sitting and/or standing);Decreased cognition;Decreased safety awareness;Decreased knowledge of precautions;Decreased knowledge of use of DME or AE      OT Treatment/Interventions: Self-care/ADL training;Therapeutic exercise;Therapeutic activities;DME and/or AE instruction;Patient/family education;Balance training    OT Goals(Current goals can be found in the care plan section) Acute Rehab OT Goals Patient Stated Goal: get stronger OT Goal Formulation: With patient/family Time For Goal Achievement: 10/03/22 Potential to Achieve Goals: Poor   OT Frequency: Min 2X/week    Co-evaluation PT/OT/SLP Co-Evaluation/Treatment: Yes Reason for Co-Treatment: For patient/therapist safety;To address functional/ADL transfers PT goals addressed during session: Mobility/safety with mobility;Balance OT goals addressed during session: ADL's and self-care      AM-PAC OT "6 Clicks" Daily Activity     Outcome Measure Help from another  person eating meals?: None Help from another person taking care of personal grooming?: A Little Help from another person toileting, which includes using toliet, bedpan, or urinal?: A Lot Help from another person bathing (including washing, rinsing, drying)?: A Lot Help from another person to put on and taking  off regular upper body clothing?: A Little Help from another person to put on and taking off regular lower body clothing?: A Lot 6 Click Score: 16   End of Session Equipment Utilized During Treatment: Gait belt;Rolling walker (2 wheels) Nurse Communication: Mobility status  Activity Tolerance: Patient tolerated treatment well Patient left: in chair;with call bell/phone within reach;with family/visitor present  OT Visit Diagnosis: Unsteadiness on feet (R26.81);Muscle weakness (generalized) (M62.81);History of falling (Z91.81)                Time: 0233-4356 OT Time Calculation (min): 27 min Charges:  OT General Charges $OT Visit: 1 Visit OT Evaluation $OT Eval Moderate Complexity: 1 Mod  [facility] MS, OTR/L ascom [phone]  09/19/22, 1:40 PM

## 2022-09-20 DIAGNOSIS — E271 Primary adrenocortical insufficiency: Secondary | ICD-10-CM | POA: Diagnosis not present

## 2022-09-20 DIAGNOSIS — E039 Hypothyroidism, unspecified: Secondary | ICD-10-CM

## 2022-09-20 DIAGNOSIS — L03313 Cellulitis of chest wall: Secondary | ICD-10-CM | POA: Diagnosis not present

## 2022-09-20 DIAGNOSIS — E119 Type 2 diabetes mellitus without complications: Secondary | ICD-10-CM

## 2022-09-20 DIAGNOSIS — M6281 Muscle weakness (generalized): Secondary | ICD-10-CM | POA: Diagnosis not present

## 2022-09-20 DIAGNOSIS — R531 Weakness: Secondary | ICD-10-CM | POA: Diagnosis not present

## 2022-09-20 LAB — GLUCOSE, CAPILLARY
Glucose-Capillary: 130 mg/dL — ABNORMAL HIGH (ref 70–99)
Glucose-Capillary: 242 mg/dL — ABNORMAL HIGH (ref 70–99)

## 2022-09-20 MED ORDER — DOXYCYCLINE HYCLATE 100 MG PO TABS: 100.0000 mg | ORAL_TABLET | Freq: Two times a day (BID) | ORAL | 0 refills | Status: DC

## 2022-09-20 NOTE — Progress Notes (Signed)
   09/20/22 1423  Medical Necessity for Transport Certificate --- IF THIS TRANSPORT IS ROUND TRIP OR SCHEDULED AND REPEATED, A PHYSICIAN MUST COMPLETE THIS FORM  Transport to (Location) Other (Comment) [facility] [address], [place], [place] [address])  Did the patient arrive from a [place], [place] or Group Home? No  Is this the closest appropriate facility? Yes  Date of Transport Service 09/20/22  Name of [facility]  Round Trip Transport? No  Reason for Transport Discharge  Is this a hospice patient? No  Q1 Are ALL the following "true"? 1. Patient unable to get up from bed without assistance  AND  2. Unable to ambulate  AND  3. Unable to sit in a chair, including wheelchair. Yes  Q2 Could the patient be transported safely by other means of transportation (I.E., wheelchair van)? No  Q3 Please check any of the following conditions that apply at the time of transport: Risk of injury to self and/or others  Electronic Signature Colen Darling  Credentials DP  Date Signed 09/20/22

## 2022-09-20 NOTE — Progress Notes (Signed)
CSW sent request for 3 day waiver and received a reply back stating that the facility needs to complete this due to patient being ACO patient.  CSW reached out to Seth Bake [facility], to discuss this.  Lvm and waiting for return call before updating Dr. Reesa Chew.

## 2022-09-20 NOTE — TOC Transition Note (Signed)
Transition of Care [facility]) - CM/SW Discharge Note   Patient Details  Name: Tony Huffman MRN: [id number] Date of Birth: Nov 02, 1945  Transition of Care [facility]) CM/SW Contact:  Colen Darling, [place] Phone Number: 09/20/2022, 2:27 PM   Clinical Narrative:     Donella Stade is arranging transport to [facility]. TOC calling family. RN is calling report.  Final next level of care: [place] Barriers to Discharge: Barriers Resolved   Patient Goals and CMS Choice    SNF      Discharge Placement PASRR number recieved: 09/19/22 Existing PASRR number confirmed : 09/20/22          Patient chooses bed at: [facility] Patient to be transferred to facility by: [facility] Name of family member notified: Ravinder Lukehart Patient and family notified of of transfer: 09/20/22  Discharge Plan and Services                         SNF            Social Determinants of Health (SDOH) Interventions     Readmission Risk Interventions     No data to display

## 2022-09-20 NOTE — Progress Notes (Signed)
Subjective:  CC: Tony Huffman is a 76 y.o. male  Hospital stay day 0,   chest wall abscess  HPI: No acute issues overnight  ROS:  General: Denies weight loss, weight gain, fatigue, fevers, chills, and night sweats. Heart: Denies chest pain, palpitations, racing heart, irregular heartbeat, leg pain or swelling, and decreased activity tolerance. Respiratory: Denies breathing difficulty, shortness of breath, wheezing, cough, and sputum. GI: Denies change in appetite, heartburn, nausea, vomiting, constipation, diarrhea, and blood in stool. GU: Denies difficulty urinating, pain with urinating, urgency, frequency, blood in urine.   Objective:   Temp:  [97.6 F (36.4 C)-98.6 F (37 C)] 98.1 F (36.7 C) (12/21 0809) Pulse Rate:  [68-84] 76 (12/21 0809) Resp:  [16-20] 17 (12/21 0809) BP: (114-145)/(70-85) 135/78 (12/21 0809) SpO2:  [95 %-98 %] 98 % (12/21 0809)     Height: '5\' 8"'$  (172.7 cm) Weight: 93 kg BMI (Calculated): 31.18   Intake/Output this shift:   Intake/Output Summary (Last 24 hours) at 09/20/2022 9811 Last data filed at 09/20/2022 9147 Gross per 24 hour  Intake --  Output 850 ml  Net -850 ml    Constitutional :  alert, cooperative, appears stated age, and no distress  Respiratory:  clear to auscultation bilaterally  Cardiovascular:  regular rate and rhythm  Gastrointestinal: soft, non-tender; bowel sounds normal; no masses,  no organomegaly.   Skin: Cool and moist.  Chest wall abscess with continued scant purulent drainage.  Erythema surrounding the opening remained stable, less tenderness to palpation  Psychiatric: Normal affect, non-agitated, not confused       LABS:     Latest Ref Rng & Units 103-08-23    9:59 AM 07/29/2022    9:05 AM 06/19/2022   10:45 AM  CMP  Glucose 70 - 99 mg/dL 174  164  181   BUN 8 - 23 mg/dL '22  24  24   '$ Creatinine 0.61 - 1.24 mg/dL 1.05  1.02  0.94   Sodium 135 - 145 mmol/L 134  137  134   Potassium 3.5 - 5.1 mmol/L 4.7  4.2  4.9    Chloride 98 - 111 mmol/L 101  103  103   CO2 22 - 32 mmol/L '22  25  25   '$ Calcium 8.9 - 10.3 mg/dL 9.2  9.3  8.8   Total Protein 6.5 - 8.1 g/dL 6.9  7.2  7.0   Total Bilirubin 0.3 - 1.2 mg/dL 0.9  0.9  0.6   Alkaline Phos 38 - 126 U/L 79  86  82   AST 15 - 41 U/L '24  21  25   '$ ALT 0 - 44 U/L '21  23  22       '$ Latest Ref Rng & Units 103-08-23    9:59 AM 08/06/2022   11:12 AM 07/29/2022    9:05 AM  CBC  WBC 4.0 - 10.5 K/uL 9.4  8.9  6.4   Hemoglobin 13.0 - 17.0 g/dL 13.2  13.6  14.1   Hematocrit 39.0 - 52.0 % 41.1  40.8  43.1   Platelets 150 - 400 K/uL 326  296  284     RADS: N/a Assessment:   Chest wall abscess still spontaneously draining.  Expressed some purulent drainage again at bedside there is a patent opening.  Wound then packed with corner of 4 x 4 gauze and secured with paper tape.  Recommend continuing daily wet-to-dry dressing changes.  Still no further I&D needed at this point.  Continue antibiotics  for now.   ollow-up as outpatient regarding his abscess, if no other medical issues requiring inpatient treatment.  Will continue to monitor closely as an outpatient basis  Finish antibiotic course with oral meds.  labs/images/medications/previous chart entries reviewed personally and relevant changes/updates noted above.

## 2022-09-20 NOTE — Progress Notes (Signed)
Called report to Leata Mouse at [facility].

## 2022-09-20 NOTE — Discharge Summary (Signed)
Physician Discharge Summary   Patient: Tony Huffman MRN: [id number] DOB: 1945-11-11  Admit date:     1Mar 25, 2023  Discharge date: 09/20/22  Discharge Physician: Lorella Nimrod   PCP: Tonia Ghent, MD   Recommendations at discharge:  Follow-up with general surgery Follow-up final culture results Follow-up with primary care provider  Discharge Diagnoses: Principal Problem:   Generalized weakness Active Problems:   Cellulitis of chest wall   Adrenal insufficiency (Addison's disease) ([place])   Goals of care, counseling/discussion   Hypothyroidism (acquired)   Diabetes mellitus without complication [facility])   Hospital Course: Tony Huffman is a 76 y.o. 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   Patient was seen by PCP yesterday with a complaint of chest wall cellulitis and cyst for about 5 days, incision and drainage was done and he was discharged on doxycycline. This morning wife noticed that he is feeling more weak and requiring more assistance than his baseline so she called the EMS.   Patient has stopped treatment for his advanced malignancy.  Currently under palliative care and considering moving to hospice.  ED course.  Vital stable with unremarkable labs.  He received stress dose of steroid and blood cultures were drawn.  Also received a dose of ceftriaxone and vancomycin CT head with some progression of his brain metastatic lesion, no midline shift, mild vasogenic edema. Chest wall ultrasound with 2.5 x 1.0 x 2.6 cm complex fluid collection in the superficial anterior chest wall near the sternum, at the site of patient concern. This is nonspecific by ultrasound but imaging features could be compatible with abscess.  General surgery was consulted and patient underwent another incision and drainage at bedside.  12/20: Vitals  remained stable.  Preliminary wound cultures with no organisms seen. Preliminary blood cultures negative in 24 hours.  COVID-19, influenza and RSV PCR negative.  Surgery placed their recommendations for discharge. PT recommended SNF.  Patient is from [place] and can go to their skilled nursing facility for rehab.  By the time we obtained insurance authorization it was late for the facility to arrange his medication so they were requesting early morning discharge.  12/21: Patient remained stable.  Wound cultures were reincorporated, preliminary results with no organisms.  Surgery change his dressing.  Cellulitis seems improving and they are recommending continuation of doxycycline for 1 week.  Patient need to follow-up with general surgery and his primary care provider.  He will need daily dressing change with a sterile gauze.  He will continue on his current medications and need to have a follow-up with his providers for further recommendations.  Patient will be high risk for deterioration and mortality based on life limiting underlying comorbidities.  Currently under outpatient palliative care and would like to go under hospice care after getting some rehab.   Assessment and Plan: * Generalized weakness Patient with stage IV malignancy who is currently under hospice care as he does not want further management.  CT head with slight increase in brain mets with some vasogenic edema but no midline shift.  Worsening weakness. -PT/OT evaluation-recommending SNF  Cellulitis of chest wall S/P incision and drainage by PCP and he was placed on doxycycline. Soft tissue ultrasound with concern of abscess, may be in the plate drainage by PCP. -General surgery concern to see if they can do another incision and drainage. Preliminary cultures with no organism.  Blood cultures remain negative. -Patient received ceftriaxone and vancomycin in ED followed by continuation of ceftriaxone which was discontinued  today after discussing with general surgery -Continue with doxycycline  Adrenal insufficiency (Addison's disease) ([place]) -Continue home [facility] [place]   Goals of care, counseling/discussion Patient has declined further management and is considering moving on with hospice.   Hypothyroidism (acquired) -Continue home Synthroid  Diabetes mellitus without complication (HCC) Recent A1c of 6.6 and he was on metformin at home. -SSI   Consultants: General surgery Procedures performed: Incision and drainage Disposition: Skilled nursing facility Diet recommendation:  Discharge Diet Orders (From admission, onward)     Start     Ordered   09/20/22 0000  Diet - low sodium heart healthy        09/20/22 1055           Regular diet DISCHARGE MEDICATION: Allergies as of 09/20/2022       Reactions   Other Rash   Blisters with bandaids Blisters with bandaids   Tegaderm Ag Mesh [silver]    Tape Rash   Blisters, rash. Paper tape OK. Blisters, rash. Paper tape OK.        Medication List     STOP taking these medications    cyclobenzaprine 5 MG tablet Commonly known as: FLEXERIL       TAKE these medications    acetaminophen 325 MG tablet Commonly known as: TYLENOL Take 650 mg by mouth 3 (three) times daily.   colchicine 0.6 MG tablet Take 1 tablet (0.6 mg total) by mouth as needed. First sign of Gout Flare. Take 2 tablets ( 1.'2mg'$ ) by mouth at first sign of gout flare followed by 1 tablet ( 0.'6mg'$ ) after 1 hour. ( max 1.'8mg'$  within 1 hours)   cyanocobalamin 1000 MCG tablet Take 1,000 mcg by mouth every other day.   doxycycline 100 MG tablet Commonly known as: VIBRA-TABS Take 1 tablet (100 mg total) by mouth 2 (two) times daily.   fludrocortisone 0.1 MG tablet Commonly known as: FLORINEF Take 0.05-0.1 mg by mouth every other day.   gabapentin 600 MG tablet Commonly known as: NEURONTIN Take 900 mg by mouth at bedtime.   hydrocortisone 10 MG tablet Commonly  known as: CORTEF Take 1 tablets ('15mg'$ ) by mouth every morning,  tablet ('5mg'$ ) by mouth daily at lunchtime and take  tablet ('5mg'$ ) by mouth every evening at dinner   levothyroxine 75 MCG tablet Commonly known as: SYNTHROID Take 75 mcg by mouth daily.   metFORMIN 500 MG tablet Commonly known as: GLUCOPHAGE Take 500 mg by mouth 2 (two) times daily with a meal.   mirabegron ER 50 MG Tb24 tablet Commonly known as: MYRBETRIQ Take 1 tablet (50 mg total) by mouth daily.   PRESERVISION AREDS 2 PO Take 1 tablet by mouth in the morning and at bedtime.   PSYLLIUM HUSK PO Take 3.4 g by mouth every evening.   Vitamin D3 125 MCG (5000 UT) Caps Take 1 capsule by mouth daily.               Discharge Care Instructions  (From admission, onward)           Start     Ordered   09/20/22 0000  Leave dressing on - Keep it clean, dry, and intact until clinic visit        09/20/22 1055   09/20/22 0000  Change dressing (specify)       Comments: Dressing change: 1 times per day using  sterile gauze.   09/20/22 1055            Contact information for follow-up providers     Sakai, Isami, DO Follow up in 1 week(s).   Specialty: Surgery Why: Chest wall abscess Contact information: [address] [place] [address] [phone]         Tonia Ghent, MD. Schedule an appointment as soon as possible for a visit in 1 week(s).   Specialty: Family Medicine Contact information: [place] [address] [phone]              Contact information for after-discharge care     Destination     [facility] .   Service: Skilled Nursing Contact information: [place] [phone]                    Discharge Exam: Danley Danker Weights   1Mar 02, 2023 0954  Weight: 93 kg   General.  Frail elderly man, in no acute distress. Pulmonary.  Lungs clear bilaterally, normal respiratory  effort. CV.  Regular rate and rhythm, no JVD, rub or murmur. Abdomen.  Soft, nontender, nondistended, BS positive. CNS.  Alert and oriented .  No focal neurologic deficit. Extremities.  No edema, no cyanosis, pulses intact and symmetrical. Psychiatry.  Judgment and insight appears normal.   Condition at discharge: stable  The results of significant diagnostics from this hospitalization (including imaging, microbiology, ancillary and laboratory) are listed below for reference.   Imaging Studies: [place] CHEST SOFT TISSUE  Result Date: 107/13/23 CLINICAL DATA:  Anterior superior soft tissue wound in the region of the sternum EXAM: ULTRASOUND OF CHEST SOFT TISSUES TECHNIQUE: Ultrasound examination of the chest wall soft tissues was performed in the area of clinical concern. COMPARISON:  None Available. FINDINGS: Focused ultrasound exam of the superficial anterior chest wall was performed in the region of patient concern, near the sternum. This reveals a 2.5 x 1.0 x 2.6 cm complex fluid collection in the subcutaneous tissues that appears to track superficially to the skin along its inferior margin. There is surrounding subcutaneous edema evident. IMPRESSION: 2.5 x 1.0 x 2.6 cm complex fluid collection in the superficial anterior chest wall near the sternum, at the site of patient concern. This is nonspecific by ultrasound but imaging features could be compatible with abscess. Subcutaneous edema noted in the adjacent regional soft tissues of the anterior chest wall. Electronically Signed   By: Misty Stanley M.D.   On: 107/13/23 17:14   CT Head Wo Contrast  Result Date: 107/13/23 CLINICAL DATA:  76 year old male with sudden severe headache. Metastatic renal cell carcinoma, known left parietal lobe metastasis. EXAM: CT HEAD WITHOUT CONTRAST TECHNIQUE: Contiguous axial images were obtained from the base of the skull through the vertex without intravenous contrast. RADIATION DOSE REDUCTION: This exam was  performed according to the departmental dose-optimization program which includes automated exposure control, adjustment of the mA and/or kV according to patient size and/or use of iterative reconstruction technique. COMPARISON:  Brain MRI and head CT 07/29/2022. FINDINGS: Brain: Lobulated and hyperdense medial and superior right parietal lobe brain metastasis, with internal hemorrhage demonstrated on MRI in October, now measures 28 x 17 by 15 mm (AP by transverse by CC) versus 26 x 14 x 16 mm and October. But regional vasogenic edema has substantially increased and is moderate. Still, there is only mild mass effect on the left lateral ventricle. No intraventricular hemorrhage.  No extra-axial hemorrhage. Underlying generalized cerebral volume loss. No midline shift. Basilar cisterns remain patent. Outside of the left parietal lobe gray-white differentiation appears stable. No cortically based acute infarct identified. Vascular: Calcified atherosclerosis at the skull base. No suspicious intracranial vascular hyperdensity. Skull: No acute or suspicious osseous lesion identified. Sinuses/Orbits: Visualized paranasal sinuses and mastoids are stable and well aerated. Other: No acute orbit or scalp soft tissue finding. IMPRESSION: 1. Essentially stable size of a lobulated 2.8 cm left parietal lobe Metastasis since October, but increased and moderate regional Vasogenic Edema since that time. Mild associated mass effect on the left lateral ventricle. 2. But no midline shift. No new metastatic disease evident by noncontrast CT (MRI without and with contrast would be most sensitive and specific). No new intracranial abnormality identified. Electronically Signed   By: Genevie Ann M.D.   On: 1Apr 21, 2023 10:52   DG Chest 2 View  Result Date: 1Apr 21, 2023 CLINICAL DATA:  Confusion. History of metastatic renal cell carcinoma. EXAM: CHEST - 2 VIEW COMPARISON:  PET-CT 07/05/2022 FINDINGS: The cardiac silhouette, mediastinal and hilar  contours are stable. Stable pulmonary nodules consistent with known metastatic disease. No superimposed infiltrates or effusions. The bony thorax is grossly intact. IMPRESSION: 1. No acute cardiopulmonary findings. 2. Stable pulmonary nodules consistent with known metastatic disease. Electronically Signed   By: Marijo Sanes M.D.   On: 1Apr 21, 2023 09:43    Microbiology: Results for orders placed or performed during the hospital encounter of 1March 21, 2023  Blood culture (routine x 2)     Status: None (Preliminary result)   Collection Time: 1March 21, 2023  5:12 PM   Specimen: BLOOD  Result Value Ref Range Status   Specimen Description BLOOD BLOOD RIGHT FOREARM  Final   Special Requests   Final    BOTTLES DRAWN AEROBIC AND ANAEROBIC Blood Culture results may not be optimal due to an excessive volume of blood received in culture bottles   Culture   Final    NO GROWTH 2 DAYS Performed at [facility], [address] [place], [place] [address]    Report Status PENDING  Incomplete  Blood culture (routine x 2)     Status: None (Preliminary result)   Collection Time: 1March 21, 2023  5:12 PM   Specimen: BLOOD  Result Value Ref Range Status   Specimen Description BLOOD LEFT ANTECUBITAL  Final   Special Requests   Final    BOTTLES DRAWN AEROBIC AND ANAEROBIC Blood Culture adequate volume   Culture   Final    NO GROWTH 2 DAYS Performed at [facility], [address] [place], [place] [address]    Report Status PENDING  Incomplete  Resp Panel by RT-PCR (Flu A&B, Covid) Anterior Nasal Swab     Status: None   Collection Time: 1March 21, 2023  5:12 PM   Specimen: Anterior Nasal Swab  Result Value Ref Range Status   SARS Coronavirus 2 by RT PCR NEGATIVE NEGATIVE Final    Comment: (NOTE) SARS-CoV-2 target nucleic acids are NOT DETECTED.  The SARS-CoV-2 RNA is generally detectable in upper respiratory specimens during the acute phase of infection. The lowest concentration of SARS-CoV-2 viral copies  this assay can detect is 138 copies/mL. A negative result does not preclude SARS-Cov-2 infection and should not be used as the sole basis for treatment or other patient management decisions. A negative result may occur with  improper specimen collection/handling, submission of specimen other than nasopharyngeal swab, presence of viral mutation(s) within the areas targeted by this assay, and inadequate  number of viral copies(<138 copies/mL). A negative result must be combined with clinical observations, patient history, and epidemiological information. The expected result is Negative.  Fact Sheet for Patients:  EntrepreneurPulse.com.au  Fact Sheet for Healthcare Providers:  IncredibleEmployment.be  This test is no t yet approved or cleared by the [place] FDA and  has been authorized for detection and/or diagnosis of SARS-CoV-2 by FDA under an Emergency Use Authorization (EUA). This EUA will remain  in effect (meaning this test can be used) for the duration of the COVID-19 declaration under Section 564(b)(1) of the Act, 21 U.S.C.section 360bbb-3(b)(1), unless the authorization is terminated  or revoked sooner.       Influenza A by PCR NEGATIVE NEGATIVE Final   Influenza B by PCR NEGATIVE NEGATIVE Final    Comment: (NOTE) The Xpert Xpress SARS-CoV-2/FLU/RSV plus assay is intended as an aid in the diagnosis of influenza from Nasopharyngeal swab specimens and should not be used as a sole basis for treatment. Nasal washings and aspirates are unacceptable for Xpert Xpress SARS-CoV-2/FLU/RSV testing.  Fact Sheet for Patients: EntrepreneurPulse.com.au  Fact Sheet for Healthcare Providers: IncredibleEmployment.be  This test is not yet approved or cleared by the [place] FDA and has been authorized for detection and/or diagnosis of SARS-CoV-2 by FDA under an Emergency Use Authorization (EUA). This EUA  will remain in effect (meaning this test can be used) for the duration of the COVID-19 declaration under Section 564(b)(1) of the Act, 21 U.S.C. section 360bbb-3(b)(1), unless the authorization is terminated or revoked.  Performed at [facility], [address], [place], [place] [address]   Aerobic Culture w Gram Stain (superficial specimen)     Status: None (Preliminary result)   Collection Time: 101-25-2023  5:12 PM   Specimen: Chest; Wound  Result Value Ref Range Status   Specimen Description   Final    CHEST Performed at [facility], [address], [place], [place] [address]    Special Requests   Final    Normal Performed at [facility], [address] [place], [place] [address]    Gram Stain   Final    RARE WBC PRESENT, PREDOMINANTLY PMN NO ORGANISMS SEEN    Culture   Final    CULTURE REINCUBATED FOR BETTER GROWTH Performed at [facility], [place] [address], [place], [place] [address]    Report Status PENDING  Incomplete    Labs: CBC: Recent Labs  Lab 101-25-2023 0959  WBC 9.4  NEUTROABS 6.5  HGB 13.2  HCT 41.1  MCV 83.4  PLT 638   Basic Metabolic Panel: Recent Labs  Lab 101-25-2023 0959  NA 134*  K 4.7  CL 101  CO2 22  GLUCOSE 174*  BUN 22  CREATININE 1.05  CALCIUM 9.2   Liver Function Tests: Recent Labs  Lab 101-25-2023 0959  AST 24  ALT 21  ALKPHOS 79  BILITOT 0.9  PROT 6.9  ALBUMIN 3.8   CBG: Recent Labs  Lab 09/19/22 0842 09/19/22 1212 09/19/22 1650 09/19/22 2101 09/20/22 0809  GLUCAP 144* 147* 216* 204* 130*    Discharge time spent: greater than 30 minutes.  This record has been created using Systems analyst. Errors have been sought and corrected,but may not always be located. Such creation errors do not reflect on the standard of care.   Signed: Lorella Nimrod, MD [facility] 09/20/2022

## 2022-09-21 ENCOUNTER — Non-Acute Institutional Stay (SKILLED_NURSING_FACILITY): Payer: Medicare Other | Admitting: Student

## 2022-09-21 ENCOUNTER — Ambulatory Visit: Payer: Medicare Other

## 2022-09-21 ENCOUNTER — Encounter: Payer: Self-pay | Admitting: Student

## 2022-09-21 DIAGNOSIS — Z66 Do not resuscitate: Secondary | ICD-10-CM

## 2022-09-21 DIAGNOSIS — I619 Nontraumatic intracerebral hemorrhage, unspecified: Secondary | ICD-10-CM

## 2022-09-21 DIAGNOSIS — E271 Primary adrenocortical insufficiency: Secondary | ICD-10-CM

## 2022-09-21 DIAGNOSIS — I4892 Unspecified atrial flutter: Secondary | ICD-10-CM

## 2022-09-21 DIAGNOSIS — E039 Hypothyroidism, unspecified: Secondary | ICD-10-CM

## 2022-09-21 DIAGNOSIS — I4891 Unspecified atrial fibrillation: Secondary | ICD-10-CM

## 2022-09-21 DIAGNOSIS — L729 Follicular cyst of the skin and subcutaneous tissue, unspecified: Secondary | ICD-10-CM

## 2022-09-21 DIAGNOSIS — E119 Type 2 diabetes mellitus without complications: Secondary | ICD-10-CM

## 2022-09-21 DIAGNOSIS — C7490 Malignant neoplasm of unspecified part of unspecified adrenal gland: Secondary | ICD-10-CM

## 2022-09-21 DIAGNOSIS — C642 Malignant neoplasm of left kidney, except renal pelvis: Secondary | ICD-10-CM

## 2022-09-21 DIAGNOSIS — N1831 Chronic kidney disease, stage 3a: Secondary | ICD-10-CM

## 2022-09-21 DIAGNOSIS — R531 Weakness: Secondary | ICD-10-CM

## 2022-09-21 DIAGNOSIS — L089 Local infection of the skin and subcutaneous tissue, unspecified: Secondary | ICD-10-CM

## 2022-09-21 DIAGNOSIS — C7802 Secondary malignant neoplasm of left lung: Secondary | ICD-10-CM

## 2022-09-21 LAB — BLOOD CULTURE ID PANEL (REFLEXED) - BCID2

## 2022-09-21 LAB — AEROBIC CULTURE W GRAM STAIN (SUPERFICIAL SPECIMEN): Special Requests: NORMAL

## 2022-09-21 NOTE — ED Provider Notes (Signed)
Procedures     ----------------------------------------- 3:59 AM on 09/21/2022 -----------------------------------------  Informed of blood cx positive for staph epi in 1/4 bottles - aerobic.  C/w contaminant, no further management needed.    Carrie Mew, MD 09/21/22 0400

## 2022-09-21 NOTE — Progress Notes (Unsigned)
Provider:  Dewayne Shorter, MD Location:   [facility] Room Number: [id number] A Place of Service:  SNF ([id number])  PCP: Tonia Ghent, MD Patient Care Team: Tonia Ghent, MD as PCP - General (Family Medicine) [name], MD as Physician Assistant (Endocrinology) [name], MD as Referring Physician (Neurology)  Extended Emergency Contact Information Primary Emergency Contact: Tucker, Minter Mobile Phone: [phone] Relation: Spouse  Code Status: DNR Goals of Care: Advanced Directive information    09/21/2022    8:24 AM  Advanced Directives  Does Patient Have a Medical Advance Directive? Yes  Type of Advance Directive Out of facility DNR (pink MOST or yellow form)  Does patient want to make changes to medical advance directive? No - Patient declined  Pre-existing out of facility DNR order (yellow form or pink MOST form) Pink MOST form placed in chart (order not valid for inpatient use);Yellow form placed in chart (order not valid for inpatient use)      Chief Complaint  Patient presents with   New Admit To SNF    Admission to SNF   Immunizations    Shingrix vaccine due   Quality Metric Gaps    Foot exam, eye exam, diabetic kidney evaluation and Hep C screening are due    HPI: Patient is a 76 y.o. male seen today for admission to  He was very confused on Tuesday night.  Name, DOB, days of the week backwards. Unable to do the months of the year backwards. He felt confused throughout the hospitalization. Says it is Thursday in December. He was given benadryl Wednesday night for itching. Discontinued his gabapentin.   Some itching on the chest. No pain in the area.   His tumor is the same size, but has more swelling around the tumor. He is complaining of headaches. Wife says she was unaware fo this information. She was told on 12/12 that he should transition to hospice, and they are on palliative care at this time.   Patient states  he doesn't want any more procedures. He has had painful, unsuccessful procedure. Chemo was a negative experience, but it did buy him some time. Lots of side effects-- difficulty walking and dragging his leg.  Past Medical History:  Diagnosis Date   Adrenal insufficiency (HCC)    Anxiety    Chronic kidney disease, stage 3 (HCC) 11/13/2011   Erectile dysfunction    GERD (gastroesophageal reflux disease)    Gout 09/04/2013   H/O atrial flutter 03/07/2017   Hyperlipidemia 06/16/2014   Hypertension    Hypothyroidism (acquired) 04/14/2014   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[place]) 11/10/2010   OSA (obstructive sleep apnea) 07/24/2016   Pancreatic mass 08/01/2018   Renal carcinoma ([place]) 06/16/2015   RLS (restless legs syndrome) 07/24/2016   Sleep apnea    Testicular hypofunction    Tussive syncopes 10/15/2013   Type 2 diabetes mellitus ([place])    Vitamin D deficiency disease    Past Surgical History:  Procedure Laterality Date   ADRENALECTOMY     CATARACT EXTRACTION  10/2014   CATARACT EXTRACTION EXTRACAPSULAR  11/05/2016   with Insertion Intraocular Prostheis.    COLONOSCOPY  05/06/2017   with removal lesions by snare.   CYSTECTOMY     Upper Neck/Chest Area, [facility]   NEPHRECTOMY     POPLITEAL SYNOVIAL CYST EXCISION     SPINE SURGERY  1989   THYROIDECTOMY     patient states they only removed half  TONSILLECTOMY      reports that he quit smoking about 46 years ago. His smoking use included cigarettes. He has a 10.00 pack-year smoking history. He has been exposed to tobacco smoke. He has never used smokeless tobacco. He reports that he does not currently use alcohol. He reports that he does not use drugs. Social History   Socioeconomic History   Marital status: Married    Spouse name: Not on file   Number of children: Not on file   Years of education: Not on file   Highest education level: Not on file  Occupational History   Not on file  Tobacco Use   Smoking  status: Former    Packs/day: 1.00    Years: 10.00    Total pack years: 10.00    Types: Cigarettes    Quit date: 13    Years since quitting: 46.0    Passive exposure: Past   Smokeless tobacco: Never  Vaping Use   Vaping Use: Never used  Substance and Sexual Activity   Alcohol use: Not Currently   Drug use: Never   Sexual activity: Not Currently  Other Topics Concern   Not on file  Social History Narrative   Married 2002.   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   Lives at [facility].   Social Determinants of Health   Financial Resource Strain: Low Risk  (07/30/2022)   Overall Financial Resource Strain (CARDIA)    Difficulty of Paying Living Expenses: Not hard at all  Food Insecurity: No Food Insecurity (09/19/2022)   Hunger Vital Sign    Worried About Running Out of Food in the Last Year: Never true    Ran Out of Food in the Last Year: Never true  Transportation Needs: No Transportation Needs (09/19/2022)   PRAPARE - Hydrologist (Medical): No    Lack of Transportation (Non-Medical): No  Physical Activity: Inactive (07/30/2022)   Exercise Vital Sign    Days of Exercise per Week: 0 days    Minutes of Exercise per Session: 0 min  Stress: Stress Concern Present (07/30/2022)   [place]    Feeling of Stress : To some extent  Social Connections: Socially Integrated (07/30/2022)   Social Connection and Isolation Panel [NHANES]    Frequency of Communication with Friends and Family: Twice a week    Frequency of Social Gatherings with Friends and Family: Twice a week    Attends Religious Services: 1 to 4 times per year    Active Member of [employer] or Organizations: Yes    Attends Archivist Meetings: 1 to 4 times per year    Marital Status: Married  Human resources officer Violence: Not At Risk (09/19/2022)   Humiliation, Afraid, Rape, and Kick questionnaire    Fear of Current  or Ex-Partner: No    Emotionally Abused: No    Physically Abused: No    Sexually Abused: No    Functional Status Survey:    Family History  Problem Relation Age of Onset   Ovarian cancer Mother    Cancer Mother    Cancer Father    Heart disease Father    Coronary artery disease Father    Hypertension Father    Macular degeneration Father    Heart disease Brother    Diabetes Brother    Coronary artery disease Brother    Diabetes type II Brother    Coronary artery disease Paternal Grandfather  Health Maintenance  Topic Date Due   FOOT EXAM  Never done   OPHTHALMOLOGY EXAM  Never done   Diabetic kidney evaluation - Urine ACR  Never done   Hepatitis C Screening  Never done   Zoster Vaccines- Shingrix (1 of 2) Never done   COVID-19 Vaccine (7 - 2023-24 season) 10/03/2022 (Originally 06/01/2022)   HEMOGLOBIN A1C  01/28/2023   DTaP/Tdap/Td (2 - Td or Tdap) 06/04/2023   Diabetic kidney evaluation - eGFR measurement  09/19/2023   Pneumonia Vaccine 26+ Years old  Completed   INFLUENZA VACCINE  Completed   HPV VACCINES  Aged Out    Allergies  Allergen Reactions   Other Rash    Blisters with bandaids Blisters with bandaids    Tegaderm Ag Mesh [Silver]    Tape Rash    Blisters, rash. Paper tape OK. Blisters, rash. Paper tape OK.     Allergies as of 09/21/2022       Reactions   Other Rash   Blisters with bandaids Blisters with bandaids   Tegaderm Ag Mesh [silver]    Tape Rash   Blisters, rash. Paper tape OK. Blisters, rash. Paper tape OK.        Medication List        Accurate as of September 21, 2022  8:48 AM. If you have any questions, ask your nurse or doctor.          acetaminophen 325 MG tablet Commonly known as: TYLENOL Take 650 mg by mouth 3 (three) times daily.   colchicine 0.6 MG tablet Take 1 tablet (0.6 mg total) by mouth as needed. First sign of Gout Flare. Take 2 tablets ( 1.79m) by mouth at first sign of gout flare followed by 1  tablet ( 0.677m after 1 hour. ( max 1.50m59mithin 1 hours)   cyanocobalamin 1000 MCG tablet Take 1,000 mcg by mouth every other day.   doxycycline 100 MG tablet Commonly known as: VIBRA-TABS Take 1 tablet (100 mg total) by mouth 2 (two) times daily.   fludrocortisone 0.1 MG tablet Commonly known as: FLORINEF Take 0.05-0.1 mg by mouth every other day.   gabapentin 300 MG capsule Commonly known as: NEURONTIN Take 900 mg by mouth 3 (three) times daily.   hydrocortisone 10 MG tablet Commonly known as: CORTEF Take 1 tablets (23m44my mouth every morning,  tablet (5mg)75m mouth daily at lunchtime and take  tablet (5mg) 75mmouth every evening at dinner   levothyroxine 75 MCG tablet Commonly known as: SYNTHROID Take 75 mcg by mouth daily.   metFORMIN 500 MG tablet Commonly known as: GLUCOPHAGE Take 500 mg by mouth 2 (two) times daily with a meal.   mirabegron ER 50 MG Tb24 tablet Commonly known as: MYRBETRIQ Take 1 tablet (50 mg total) by mouth daily.   PRESERVISION AREDS 2 PO Take 1 tablet by mouth in the morning and at bedtime.   PSYLLIUM HUSK PO Take 3.4 g by mouth every evening.   Vitamin D3 125 MCG (5000 UT) Caps Take 1 capsule by mouth daily.        Review of Systems  Vitals:   09/21/22 0822  BP: (!) 137/90  Pulse: 85  Resp: 18  Temp: 98.5 F (36.9 C)  SpO2: 96%  Weight: 205 lb 12.8 oz (93.4 kg)  Height: _0  (1.727 m)   Body mass index is 31.29 kg/m. Physical Exam  Labs reviewed: Basic Metabolic Panel: Recent Labs    06/19/22 1045 07/29/22 0905  09/18/22 0959  NA 134* 137 134*  K 4.9 4.2 4.7  CL 103 103 101  CO2 _0 GLUCOSE 181* 164* 174*  BUN 24* 24* 22  CREATININE 0.94 1.02 1.05  CALCIUM 8.8* 9.3 9.2   Liver Function Tests: Recent Labs    06/19/22 1045 07/29/22 0905 09/18/22 0959  AST _1 ALT _2 ALKPHOS 82 86 79  BILITOT 0.6 0.9 0.9  PROT 7.0 7.2 6.9  ALBUMIN 3.9 3.9 3.8   No results for input(s):  "LIPASE", "AMYLASE" in the last 8760 hours. No results for input(s): "AMMONIA" in the last 8760 hours. CBC: Recent Labs    06/19/22 1045 07/29/22 0905 08/06/22 1112 09/18/22 0959  WBC 8.0 6.4 8.9 9.4  NEUTROABS 5.4 3.6  --  6.5  HGB 13.7 14.1 13.6 13.2  HCT 41.4 43.1 40.8 41.1  MCV 84.3 82.1 82.6 83.4  PLT 264 284 296 326   Cardiac Enzymes: No results for input(s): "CKTOTAL", "CKMB", "CKMBINDEX", "TROPONINI" in the last 8760 hours. BNP: Invalid input(s): "POCBNP" Lab Results  Component Value Date   HGBA1C 7.2 (H) 07/29/2022   Lab Results  Component Value Date   TSH 2.332 09/18/2022   Lab Results  Component Value Date   [id number] 564 04/07/2021   No results found for: "FOLATE" No results found for: "IRON", "TIBC", "FERRITIN"  Imaging and Procedures obtained prior to SNF admission: [place] CHEST SOFT TISSUE  Result Date: 09/18/2022 CLINICAL DATA:  Anterior superior soft tissue wound in the region of the sternum EXAM: ULTRASOUND OF CHEST SOFT TISSUES TECHNIQUE: Ultrasound examination of the chest wall soft tissues was performed in the area of clinical concern. COMPARISON:  None Available. FINDINGS: Focused ultrasound exam of the superficial anterior chest wall was performed in the region of patient concern, near the sternum. This reveals a 2.5 x 1.0 x 2.6 cm complex fluid collection in the subcutaneous tissues that appears to track superficially to the skin along its inferior margin. There is surrounding subcutaneous edema evident. IMPRESSION: 2.5 x 1.0 x 2.6 cm complex fluid collection in the superficial anterior chest wall near the sternum, at the site of patient concern. This is nonspecific by ultrasound but imaging features could be compatible with abscess. Subcutaneous edema noted in the adjacent regional soft tissues of the anterior chest wall. Electronically Signed   By: Misty Stanley M.D.   On: 09/18/2022 17:14   CT Head Wo Contrast  Result Date: 09/18/2022 CLINICAL DATA:   76 year old male with sudden severe headache. Metastatic renal cell carcinoma, known left parietal lobe metastasis. EXAM: CT HEAD WITHOUT CONTRAST TECHNIQUE: Contiguous axial images were obtained from the base of the skull through the vertex without intravenous contrast. RADIATION DOSE REDUCTION: This exam was performed according to the departmental dose-optimization program which includes automated exposure control, adjustment of the mA and/or kV according to patient size and/or use of iterative reconstruction technique. COMPARISON:  Brain MRI and head CT 07/29/2022. FINDINGS: Brain: Lobulated and hyperdense medial and superior right parietal lobe brain metastasis, with internal hemorrhage demonstrated on MRI in October, now measures 28 x 17 by 15 mm (AP by transverse by CC) versus 26 x 14 x 16 mm and October. But regional vasogenic edema has substantially increased and is moderate. Still, there is only mild mass effect on the left lateral ventricle. No intraventricular hemorrhage.  No extra-axial hemorrhage. Underlying generalized cerebral volume loss. No midline shift. Basilar cisterns remain patent. Outside of the left parietal lobe  gray-white differentiation appears stable. No cortically based acute infarct identified. Vascular: Calcified atherosclerosis at the skull base. No suspicious intracranial vascular hyperdensity. Skull: No acute or suspicious osseous lesion identified. Sinuses/Orbits: Visualized paranasal sinuses and mastoids are stable and well aerated. Other: No acute orbit or scalp soft tissue finding. IMPRESSION: 1. Essentially stable size of a lobulated 2.8 cm left parietal lobe Metastasis since October, but increased and moderate regional Vasogenic Edema since that time. Mild associated mass effect on the left lateral ventricle. 2. But no midline shift. No new metastatic disease evident by noncontrast CT (MRI without and with contrast would be most sensitive and specific). No new intracranial  abnormality identified. Electronically Signed   By: Genevie Ann M.D.   On: 09/18/2022 10:52   DG Chest 2 View  Result Date: 09/18/2022 CLINICAL DATA:  Confusion. History of metastatic renal cell carcinoma. EXAM: CHEST - 2 VIEW COMPARISON:  PET-CT 07/05/2022 FINDINGS: The cardiac silhouette, mediastinal and hilar contours are stable. Stable pulmonary nodules consistent with known metastatic disease. No superimposed infiltrates or effusions. The bony thorax is grossly intact. IMPRESSION: 1. No acute cardiopulmonary findings. 2. Stable pulmonary nodules consistent with known metastatic disease. Electronically Signed   By: Marijo Sanes M.D.   On: 09/18/2022 09:43    Assessment/Plan There are no diagnoses linked to this encounter.   Family/ staff Communication:   Labs/tests ordered:

## 2022-09-22 LAB — CULTURE, BLOOD (ROUTINE X 2): Special Requests: ADEQUATE

## 2022-09-23 LAB — CULTURE, BLOOD (ROUTINE X 2): Culture: NO GROWTH

## 2022-09-26 ENCOUNTER — Encounter: Payer: Self-pay | Admitting: Student

## 2022-09-26 ENCOUNTER — Non-Acute Institutional Stay (SKILLED_NURSING_FACILITY): Payer: Medicare Other | Admitting: Student

## 2022-09-26 DIAGNOSIS — C7802 Secondary malignant neoplasm of left lung: Secondary | ICD-10-CM | POA: Diagnosis not present

## 2022-09-26 DIAGNOSIS — C7490 Malignant neoplasm of unspecified part of unspecified adrenal gland: Secondary | ICD-10-CM

## 2022-09-26 DIAGNOSIS — Z515 Encounter for palliative care: Secondary | ICD-10-CM

## 2022-09-26 DIAGNOSIS — R42 Dizziness and giddiness: Secondary | ICD-10-CM

## 2022-09-26 DIAGNOSIS — C7931 Secondary malignant neoplasm of brain: Secondary | ICD-10-CM

## 2022-09-26 DIAGNOSIS — R531 Weakness: Secondary | ICD-10-CM

## 2022-09-26 DIAGNOSIS — L089 Local infection of the skin and subcutaneous tissue, unspecified: Secondary | ICD-10-CM

## 2022-09-26 DIAGNOSIS — Z66 Do not resuscitate: Secondary | ICD-10-CM

## 2022-09-26 DIAGNOSIS — L729 Follicular cyst of the skin and subcutaneous tissue, unspecified: Secondary | ICD-10-CM

## 2022-09-26 DIAGNOSIS — Z9189 Other specified personal risk factors, not elsewhere classified: Secondary | ICD-10-CM

## 2022-09-26 NOTE — Progress Notes (Signed)
Location:  Other [place].  Nursing Home Room Number: [place] of Service:  SNF [phone]) Provider:  Dr Kearney Hard, MD  Patient Care Team: Tonia Ghent, MD as PCP - General (Family Medicine) [name], MD as Physician Assistant (Endocrinology) [name], MD as Referring Physician (Neurology)  Extended Emergency Contact Information Primary Emergency Contact: Rohnan, Bartleson Mobile Phone: [phone] Relation: Spouse  Code Status:  DNR Goals of care: Advanced Directive information    09/21/2022    8:24 AM  Advanced Directives  Does Patient Have a Medical Advance Directive? Yes  Type of Advance Directive Out of facility DNR (pink MOST or yellow form)  Does patient want to make changes to medical advance directive? No - Patient declined  Pre-existing out of facility DNR order (yellow form or pink MOST form) Pink MOST form placed in chart (order not valid for inpatient use);Yellow form placed in chart (order not valid for inpatient use)     Chief Complaint  Patient presents with   Acute Visit    Dizziness    HPI:  Pt is a 76 y.o. male seen today for an acute visit for concern for dizziness.  Patient had an episode of full body shaking earlier this morning that lasted 20 minutes per his wife.  Patient continues to have weakness making it difficult to work with physical therapy.  Patient has had episodes of double vision.  He continues to have confusion.  Advance Care Planning  Discussed with his wife who is at bedside concern that this is overall signs of progression of his underlying tumor and the edema that surrounds the tumor.  Since symptoms have continued to progress concerned that it is time for patient to transition to hospice and wife agrees.  Discussed concern there are no great ways to prevent secondary complications such as nausea, vomiting, headaches, seizures based off of the expansion of the swelling.  Discussed how  swelling can impact alertness as well as breathing and recommend transition to hospice to make sure patient has appropriate medication to prevent discomfort.  Wife agrees at this time.  Patient has DNR CODE STATUS.  Patient has do not hospitalize status at this time.      Past Medical History:  Diagnosis Date   Adrenal insufficiency (HCC)    Anxiety    Chronic kidney disease, stage 3 (HCC) 11/13/2011   Erectile dysfunction    GERD (gastroesophageal reflux disease)    Gout 09/04/2013   H/O atrial flutter 03/07/2017   Hyperlipidemia 06/16/2014   Hypertension    Hypothyroidism (acquired) 04/14/2014   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[place]) 11/10/2010   OSA (obstructive sleep apnea) 07/24/2016   Pancreatic mass 08/01/2018   Renal carcinoma ([place]) 06/16/2015   RLS (restless legs syndrome) 07/24/2016   Sleep apnea    Testicular hypofunction    Tussive syncopes 10/15/2013   Type 2 diabetes mellitus ([place])    Vitamin D deficiency disease    Past Surgical History:  Procedure Laterality Date   ADRENALECTOMY     CATARACT EXTRACTION  10/2014   CATARACT EXTRACTION EXTRACAPSULAR  11/05/2016   with Insertion Intraocular Prostheis.    COLONOSCOPY  05/06/2017   with removal lesions by snare.   CYSTECTOMY     Upper Neck/Chest Area, [facility]   NEPHRECTOMY     POPLITEAL SYNOVIAL CYST EXCISION     SPINE SURGERY  1989   THYROIDECTOMY     patient states they  only removed half   TONSILLECTOMY      Allergies  Allergen Reactions   Other Rash    Blisters with bandaids Blisters with bandaids    Tegaderm Ag Mesh [Silver]    Tape Rash    Blisters, rash. Paper tape OK. Blisters, rash. Paper tape OK.     Outpatient Encounter Medications as of 09/26/2022  Medication Sig   acetaminophen (TYLENOL) 325 MG tablet Take 650 mg by mouth 3 (three) times daily.   Cholecalciferol (VITAMIN D3) 125 MCG (5000 UT) CAPS Take 1 capsule by mouth daily.   colchicine 0.6 MG tablet Take 1  tablet (0.6 mg total) by mouth as needed. First sign of Gout Flare. Take 2 tablets ( 1.'2mg'$ ) by mouth at first sign of gout flare followed by 1 tablet ( 0.'6mg'$ ) after 1 hour. ( max 1.'8mg'$  within 1 hours)   cyanocobalamin 1000 MCG tablet Take 1,000 mcg by mouth every other day.   doxycycline (VIBRA-TABS) 100 MG tablet Take 1 tablet (100 mg total) by mouth 2 (two) times daily.   fludrocortisone (FLORINEF) 0.1 MG tablet Give 0.5 tablet by mouth one time a day on Tue, Thurs. 'Sunday and Give One tablet by mouth one time a day on Mon,Wed,Fri and Sat.   gabapentin (NEURONTIN) 300 MG capsule Take 900 mg by mouth at bedtime.   hydrocortisone (CORTEF) 10 MG tablet Take 1 tablets (15mg) by mouth every morning,  tablet (5mg) by mouth daily at lunchtime and take  tablet (5mg) by mouth every evening at dinner   levothyroxine (SYNTHROID) 75 MCG tablet Take 75 mcg by mouth daily.   metFORMIN (GLUCOPHAGE) 500 MG tablet Take 500 mg by mouth 2 (two) times daily with a meal.   mirabegron ER (MYRBETRIQ) 50 MG TB24 tablet Take 1 tablet (50 mg total) by mouth daily.   Multiple Vitamins-Minerals (PRESERVISION AREDS 2 PO) Take 1 tablet by mouth in the morning and at bedtime.   PSYLLIUM HUSK PO Take 3.4 g by mouth every evening.   No facility-administered encounter medications on file as of 09/26/2022.    Review of Systems  All other systems reviewed and are negative.   Immunization History  Administered Date(s) Administered   Hepatitis B, adult 06/18/2016, 07/19/2016, 06/18/2017   Influenza, High Dose Seasonal PF 06/18/2017, 06/18/2018, 07/09/2022   Influenza,inj,Quad PF,6+ Mos 06/09/2014, 06/15/2015, 06/18/2016   Influenza-Unspecified 06/09/2014, 06/15/2015, 06/18/2016, 06/18/2017, 06/16/2019   Moderna Covid-19 Vaccine Bivalent Booster 18yrs & up 06/12/2021, 02/06/2022   Moderna Sars-Covid-2 Vaccination 10/13/2019, 11/10/2019, 05/17/2020, 01/02/2021   Pneumococcal Conjugate-13 05/13/2014, 05/31/2019    Pneumococcal Polysaccharide-23 05/04/2011   Tdap 06/03/2013   Zoster, Live 11/30/2007   Pertinent  Health Maintenance Due  Topic Date Due   FOOT EXAM  Never done   OPHTHALMOLOGY EXAM  Never done   HEMOGLOBIN A1C  01/28/2023   INFLUENZA VACCINE  Completed      12'$ /19/2023    9:55 AM 09/19/2022   12:45 AM 09/19/2022   11:00 AM 09/19/2022    8:00 PM 09/20/2022    8:30 AM  Fall Risk  Patient Fall Risk Level High fall risk High fall risk High fall risk High fall risk High fall risk   Functional Status Survey:    Vitals:   09/26/22 1137  BP: 121/73  Pulse: 72  Resp: 20  Temp: (!) 97.2 F (36.2 C)  SpO2: 96%  Weight: 203 lb 4.8 oz (92.2 kg)  Height: '5\' 8"'$  (1.727 m)   Body mass index is 30.91 kg/m.  Physical Exam Eyes:     Comments: Visual fields- double vision   Cardiovascular:     Rate and Rhythm: Normal rate.     Pulses: Normal pulses.  Pulmonary:     Effort: Pulmonary effort is normal.  Abdominal:     General: Abdomen is flat. Bowel sounds are normal.     Palpations: Abdomen is soft.  Skin:    Comments: Wound with no drainage and persistent surrounding erythema.   Neurological:     Mental Status: He is alert. He is disoriented.     Labs reviewed: Recent Labs    06/19/22 1045 07/29/22 0905 124-Apr-2023 0959  NA 134* 137 134*  K 4.9 4.2 4.7  CL 103 103 101  CO2 '25 25 22  '$ GLUCOSE 181* 164* 174*  BUN 24* 24* 22  CREATININE 0.94 1.02 1.05  CALCIUM 8.8* 9.3 9.2   Recent Labs    06/19/22 1045 07/29/22 0905 124-Apr-2023 0959  AST '25 21 24  '$ ALT '22 23 21  '$ ALKPHOS 82 86 79  BILITOT 0.6 0.9 0.9  PROT 7.0 7.2 6.9  ALBUMIN 3.9 3.9 3.8   Recent Labs    06/19/22 1045 07/29/22 0905 08/06/22 1112 124-Apr-2023 0959  WBC 8.0 6.4 8.9 9.4  NEUTROABS 5.4 3.6  --  6.5  HGB 13.7 14.1 13.6 13.2  HCT 41.4 43.1 40.8 41.1  MCV 84.3 82.1 82.6 83.4  PLT 264 284 296 326   Lab Results  Component Value Date   TSH 2.332 110-08-2022   Lab Results  Component Value Date    HGBA1C 7.2 (H) 07/29/2022   Lab Results  Component Value Date   CHOL 173 07/30/2022   HDL 31 (L) 07/30/2022   LDLCALC 90 07/30/2022   TRIG 260 (H) 07/30/2022   CHOLHDL 5.6 07/30/2022    Significant Diagnostic Results in last 30 days:  [place] CHEST SOFT TISSUE  Result Date: 110-08-2022 CLINICAL DATA:  Anterior superior soft tissue wound in the region of the sternum EXAM: ULTRASOUND OF CHEST SOFT TISSUES TECHNIQUE: Ultrasound examination of the chest wall soft tissues was performed in the area of clinical concern. COMPARISON:  None Available. FINDINGS: Focused ultrasound exam of the superficial anterior chest wall was performed in the region of patient concern, near the sternum. This reveals a 2.5 x 1.0 x 2.6 cm complex fluid collection in the subcutaneous tissues that appears to track superficially to the skin along its inferior margin. There is surrounding subcutaneous edema evident. IMPRESSION: 2.5 x 1.0 x 2.6 cm complex fluid collection in the superficial anterior chest wall near the sternum, at the site of patient concern. This is nonspecific by ultrasound but imaging features could be compatible with abscess. Subcutaneous edema noted in the adjacent regional soft tissues of the anterior chest wall. Electronically Signed   By: Misty Stanley M.D.   On: 110-08-2022 17:14   CT Head Wo Contrast  Result Date: 110-08-2022 CLINICAL DATA:  76 year old male with sudden severe headache. Metastatic renal cell carcinoma, known left parietal lobe metastasis. EXAM: CT HEAD WITHOUT CONTRAST TECHNIQUE: Contiguous axial images were obtained from the base of the skull through the vertex without intravenous contrast. RADIATION DOSE REDUCTION: This exam was performed according to the departmental dose-optimization program which includes automated exposure control, adjustment of the mA and/or kV according to patient size and/or use of iterative reconstruction technique. COMPARISON:  Brain MRI and head CT 07/29/2022.  FINDINGS: Brain: Lobulated and hyperdense medial and superior right parietal lobe brain metastasis, with internal hemorrhage  demonstrated on MRI in October, now measures 28 x 17 by 15 mm (AP by transverse by CC) versus 26 x 14 x 16 mm and October. But regional vasogenic edema has substantially increased and is moderate. Still, there is only mild mass effect on the left lateral ventricle. No intraventricular hemorrhage.  No extra-axial hemorrhage. Underlying generalized cerebral volume loss. No midline shift. Basilar cisterns remain patent. Outside of the left parietal lobe gray-white differentiation appears stable. No cortically based acute infarct identified. Vascular: Calcified atherosclerosis at the skull base. No suspicious intracranial vascular hyperdensity. Skull: No acute or suspicious osseous lesion identified. Sinuses/Orbits: Visualized paranasal sinuses and mastoids are stable and well aerated. Other: No acute orbit or scalp soft tissue finding. IMPRESSION: 1. Essentially stable size of a lobulated 2.8 cm left parietal lobe Metastasis since October, but increased and moderate regional Vasogenic Edema since that time. Mild associated mass effect on the left lateral ventricle. 2. But no midline shift. No new metastatic disease evident by noncontrast CT (MRI without and with contrast would be most sensitive and specific). No new intracranial abnormality identified. Electronically Signed   By: Genevie Ann M.D.   On: 128-Mar-2023 10:52   DG Chest 2 View  Result Date: 128-Mar-2023 CLINICAL DATA:  Confusion. History of metastatic renal cell carcinoma. EXAM: CHEST - 2 VIEW COMPARISON:  PET-CT 07/05/2022 FINDINGS: The cardiac silhouette, mediastinal and hilar contours are stable. Stable pulmonary nodules consistent with known metastatic disease. No superimposed infiltrates or effusions. The bony thorax is grossly intact. IMPRESSION: 1. No acute cardiopulmonary findings. 2. Stable pulmonary nodules consistent with  known metastatic disease. Electronically Signed   By: Marijo Sanes M.D.   On: 128-Mar-2023 09:43    Assessment/Plan Malignant neoplasm of adrenal gland, unspecified laterality, unspecified part ([place])  Malignant neoplasm metastatic to brain [facility])  Malignant neoplasm metastatic to left lung [facility])  Hospice care - Plan: LORazepam (ATIVAN) 0.5 MG tablet, LORazepam (ATIVAN) 1 MG tablet, Do not attempt resuscitation (DNR)  Generalized weakness  Infected cyst of skin  Do not resuscitate  Dizziness  At risk of seizures Patient admitted to skilled nursing facility as [place] for therapy, however his symptoms have progressed including increased weakness, dizziness, double vision, episodes of shaking, and confusion as result of his underlying malignancy.  Discussed plans of care outlined above in ACP note with wife.  At this time patient does not have capacity to make decisions as he has disorganized thinking.  Offered emotional support for wife given the difficulty with this transition to hospice.  Will discontinue physical therapy at this time.  Will order comfort meds with morphine 5 mg every 4 hours as needed Ativan 0.5 mg every 8 as needed for anxiety or seizures, an additional dose of 1 mg Ativan every 8 hours as needed for seizure that has not abated with 0.5 mg of Ativan.  Hospice consult reordered today.  Patient has a DNR, do not hospitalize, will remain with comfort care status.  Family/ staff Communication: wife, nursing, DON  Labs/tests ordered:  none  I spent greater than 45 minutes for the care of this patient with face-to-face time, goals of care conversation, coordination of care.  Greater than 15 minutes of time was spent discussing goals of care.  Tomasa Rand, MD, [facility] [phone]

## 2022-09-27 ENCOUNTER — Non-Acute Institutional Stay: Payer: Medicare Other | Admitting: Hospice

## 2022-09-27 DIAGNOSIS — C649 Malignant neoplasm of unspecified kidney, except renal pelvis: Secondary | ICD-10-CM

## 2022-09-27 DIAGNOSIS — R531 Weakness: Secondary | ICD-10-CM

## 2022-09-27 DIAGNOSIS — R519 Headache, unspecified: Secondary | ICD-10-CM

## 2022-09-27 DIAGNOSIS — Z515 Encounter for palliative care: Secondary | ICD-10-CM

## 2022-09-27 DIAGNOSIS — R5381 Other malaise: Secondary | ICD-10-CM

## 2022-09-27 NOTE — Progress Notes (Signed)
[facility] Consult Note Telephone: [phone]  Fax: [phone]    Date of encounter: 09/27/22 10:49 AM PATIENT NAME: Tony Huffman [address] [id number]   [phone] (home)  DOB: 17-Oct-1945 MRN: [id number] PRIMARY CARE PROVIDER:    Dr. Dewayne Shorter  REFERRING PROVIDER:   Tonia Ghent, MD [address] 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    Contact Information     Name Relation Home Work [place], [place] Spouse   [phone]      TELEHEALTH VISIT STATEMENT Due to the COVID-19 crisis, this visit was done via telemedicine from my office and it was initiated and consent by this patient and or family.  I connected with patient OR PROXY by a telephone/video  and verified that I am speaking with the correct person. I discussed the limitations of evaluation and management by telemedicine. Patient/proxy expressed understanding and agreed to proceed.   Palliative Care was asked to follow this patient to address advance care planning, complex medical decision making and goals of care clarification.   Visit consisted of counseling and education dealing with the complex and emotionally intense issues of symptom management and palliative care in the setting of serious and potentially life-threatening illness. Palliative care team will continue to support patient, patient's family, and medical team.  Patient reported not wanting more treatment for his cancer.  The need to respect his wish, de-escalate care and focus on comfort was discussed.  Patient interested in hospice services if condition continues to worsen.  Spouse reports PCP gave hospice referral yesterday; she is not sure when admission visit is scheduled.  NP called [facility] social worker [employer] as she is suggested, for clarification.   NP left Tony Huffman a voicemail with callback number.  NP assured Tony Huffman that she will coordinate with [facility] hospice to ensure admission visit by tomorrow 10 AM if possible.  I spent 16  minutes providing palliative consultation. More than 50% of the time in this consultation was spent on counseling patient and coordinating communication. --------------------------------------------------------------------------------------------------------------------------------------   ASSESSMENT AND PLAN / RECOMMENDATIONS:    CODE STATUS: DNR  Symptom Management/Plan:  Weakness: Generalized. Worsening, not able to walk. Was seen in ED last week. Ongoing PT/OT not helpful.  Continue ongoing supportive care. Debility: Sequel to metastatic renal carcinoma, patient continues to decline in functional requiring extensive assistance in ADLS such bathing, dressing.  He reports not able to stand this morning.  Fall precautions reiterated. Headaches: related to brian tumor. Patient does not want to be treated anymore. Patient/family is interested in hospice service in the future.  Continue Tylenol 1000 mg twice daily.  Currently effective. Metastatic renal carcinoma- no more chemo/radiation; comfort care is desired at this time.   Follow up Palliative Care Visit: Palliative care will continue to follow for complex medical decision making, advance care planning, and clarification of goals. Return in 2 weeks or prn.   PPS: weak 40%  HOSPICE ELIGIBILITY/DIAGNOSIS: TBD  Chief Complaint: Palliative Medicine follow up visit.   HISTORY OF PRESENT ILLNESS:  Tony Huffman is a 76 y.o. year old male  with multiple morbidities requiring close monitoring/management with high risk of complications and morbidity: Worsening generalized weakness secondary to metastatic renal cell carcinoma, debility, Addison's disease, polyneuropathy, T2DM, hyperlipidemia, hypertension, atrial fibrillation, vitamin D deficiency, OSA, RLS, chronic gouty  arthritis.  History obtained from review of EMR, discussion with primary team, and interview with family, facility staff/caregiver and/or Tony Huffman. All 10 point systems reviewed and are negative except as documented in history of present illness above  I reviewed available labs, medications, imaging, studies and related documents from the EMR.  Records reviewed and summarized above.    A 10 point review of systems is negative, except for the pertinent positives and negatives detailed in the HPI.  Thank you for the opportunity to participate in the care of [name].  The palliative care team will continue to follow. Please call our office at [phone] if we can be of additional assistance.   Note: Portions of this note were generated with [employer]. Dictation errors may occur despite best attempts at proofreading.    Teodoro Spray, NP

## 2022-09-27 NOTE — Addendum Note (Signed)
Addended by: Laverda Sorenson I on: 09/27/2022 04:31 PM   Modules accepted: Level of Service

## 2022-09-27 NOTE — Addendum Note (Signed)
Addended by: Laverda Sorenson I on: 09/27/2022 12:03 PM   Modules accepted: Level of Service

## 2022-09-28 ENCOUNTER — Ambulatory Visit: Payer: Medicare Other

## 2022-09-28 NOTE — Telephone Encounter (Signed)
See MyChart message from patient's wife and please send a copy of the letter for submission to the insurance company.  Thanks.

## 2022-09-28 NOTE — Telephone Encounter (Signed)
Letter has been printed and faxed.

## 2022-09-29 MED ORDER — LORAZEPAM 1 MG PO TABS: 1.0000 mg | ORAL_TABLET | Freq: Three times a day (TID) | ORAL | 0 refills | Status: DC | PRN

## 2022-09-29 MED ORDER — LORAZEPAM 0.5 MG PO TABS: 0.5000 mg | ORAL_TABLET | Freq: Three times a day (TID) | ORAL | 0 refills | Status: DC | PRN

## 2022-10-02 ENCOUNTER — Telehealth (INDEPENDENT_AMBULATORY_CARE_PROVIDER_SITE_OTHER): Payer: Medicare Other | Admitting: Family Medicine

## 2022-10-02 VITALS — Ht 68.0 in

## 2022-10-02 DIAGNOSIS — C642 Malignant neoplasm of left kidney, except renal pelvis: Secondary | ICD-10-CM | POA: Diagnosis not present

## 2022-10-02 MED ORDER — MORPHINE SULFATE 20 MG/5ML PO SOLN: 5.0000 mg | ORAL | 0 refills | Status: DC | PRN

## 2022-10-02 NOTE — Progress Notes (Unsigned)
Virtual visit completed through WebEx or similar program Patient location: home  Provider location:  at [facility], office  Participants: Patient and me (unless stated otherwise below)  Limitations and rationale for visit method d/w patient.  Patient agreed to proceed.   CC: follow up  HPI: Discussed with patient about his cancer status and most recent CT. He has transition to hospice in the meantime.  Med list updated.  No lorazepam use.  No morphine use.  No pain today.  Had some HA prev.    Recently dx'd with BCC on scalp.  He had eval for chest lesion this AM and "it looked fine."    On hospice currently.  He is in [facility].  His dizziness limited his PT.  He is using wheelchair now.  Sleeping in bed.  Transfers with assistance.  No presyncope.  He tends to drift to the left when trying to bear weight.  Meds and allergies reviewed.   ROS: Per HPI unless specifically indicated in ROS section   NAD Speech wnl  A/P: History of renal cell carcinoma, on hospice care, with CT results noted below.  1. Essentially stable size of a lobulated 2.8 cm left parietal lobe Metastasis since October, but increased and moderate regional Vasogenic Edema since that time. Mild associated mass effect on the left lateral ventricle.  With occasional headaches and gait instability/dizziness.  It is unclear to me if increasing his baseline steroid replacement with both hydrocortisone and fludrocortisone would have any effect on his symptoms.  Will ask for input from Dr. Unk Lightning, Dr. Gabriel Carina, and hospice staff about in inc baseline fludrocortisone and oral hydrocortisone.  I would like to have all parties on the same page.  Patient and wife agree with me checking on that.  See following phone note.

## 2022-10-03 ENCOUNTER — Telehealth: Payer: Self-pay | Admitting: Family Medicine

## 2022-10-03 NOTE — Assessment & Plan Note (Signed)
History of renal cell carcinoma, on hospice care, with CT results noted below.  1. Essentially stable size of a lobulated 2.8 cm left parietal lobe Metastasis since October, but increased and moderate regional Vasogenic Edema since that time. Mild associated mass effect on the left lateral ventricle.  With occasional headaches and gait instability/dizziness.  It is unclear to me if increasing his baseline steroid replacement with both hydrocortisone and fludrocortisone would have any effect on his symptoms.  Will ask for input from Dr. Unk Lightning, Dr. Gabriel Carina, and hospice staff about in inc baseline fludrocortisone and oral hydrocortisone.  I would like to have all parties on the same page.  Patient and wife agree with me checking on that.  See following phone note.

## 2022-10-03 NOTE — Telephone Encounter (Signed)
I need input from Dr. Unk Lightning, Dr. Gabriel Carina, and hospice MDs about this patient.    He has intermittent/occasional headaches and gait instability/dizziness.  It is unclear to me if increasing his baseline steroid replacement with both hydrocortisone and fludrocortisone would have any effect on his symptoms.  I would like to have all parties on the same page.   Please let me know their thoughts.  I appreciate the help of all involved.  Thanks.

## 2022-10-03 NOTE — Telephone Encounter (Signed)
Appreciate help from Dr. Unk Lightning.  See below.  Please get update/input from Dr. Gabriel Carina and hospice.  Thanks.  ============================== Dewayne Shorter, MD  Tonia Ghent, MD; [facility] Pool Caller: Unspecified (Today,  3:20 PM) I agree, Dr. Damita Dunnings. It seems like increasing the dose of steroids may help with some of the swelling surrounding the tumor, but I'm not sure. He received stress-dosing in this hospital and I think it contributed to the delirium he experienced, and continues to recover from. I'm not certain a higher dose of steroids would be helpful, but it could be worth a try. I'm happy to adjust the steroid dose in the facility, perhaps we could double his oral dosing and see how he responds.  I would appreciate Dr. Joycie Peek input as well.  Best,  VB

## 2022-10-04 ENCOUNTER — Other Ambulatory Visit: Payer: Self-pay | Admitting: *Deleted

## 2022-10-04 NOTE — Patient Outreach (Signed)
[place] Coordinator follow up. Verified with Seth Bake in Admissions at [facility], Mr. Gacek transitioned to hospice on 10/03/22. He will remain at [facility] for long term care.   No identifiable [facility] care coordination needs.    Marthenia Rolling, MSN, RN,BSN [place] Acute Care Coordinator [phone] (Direct dial)

## 2022-10-05 ENCOUNTER — Ambulatory Visit: Payer: Medicare Other

## 2022-10-05 NOTE — Telephone Encounter (Signed)
Called over to Dr. Valda Favia office and left her the requested message.

## 2022-10-08 ENCOUNTER — Non-Acute Institutional Stay (SKILLED_NURSING_FACILITY): Payer: Medicare Other | Admitting: Student

## 2022-10-08 ENCOUNTER — Encounter: Payer: Self-pay | Admitting: Student

## 2022-10-08 ENCOUNTER — Ambulatory Visit: Payer: Self-pay | Admitting: Surgery

## 2022-10-08 DIAGNOSIS — I4892 Unspecified atrial flutter: Secondary | ICD-10-CM

## 2022-10-08 DIAGNOSIS — C642 Malignant neoplasm of left kidney, except renal pelvis: Secondary | ICD-10-CM

## 2022-10-08 DIAGNOSIS — Z515 Encounter for palliative care: Secondary | ICD-10-CM | POA: Insufficient documentation

## 2022-10-08 DIAGNOSIS — E039 Hypothyroidism, unspecified: Secondary | ICD-10-CM

## 2022-10-08 DIAGNOSIS — C4441 Basal cell carcinoma of skin of scalp and neck: Secondary | ICD-10-CM

## 2022-10-08 DIAGNOSIS — I619 Nontraumatic intracerebral hemorrhage, unspecified: Secondary | ICD-10-CM | POA: Diagnosis not present

## 2022-10-08 DIAGNOSIS — N401 Enlarged prostate with lower urinary tract symptoms: Secondary | ICD-10-CM

## 2022-10-08 DIAGNOSIS — K8689 Other specified diseases of pancreas: Secondary | ICD-10-CM

## 2022-10-08 DIAGNOSIS — C7802 Secondary malignant neoplasm of left lung: Secondary | ICD-10-CM | POA: Diagnosis not present

## 2022-10-08 DIAGNOSIS — I4891 Unspecified atrial fibrillation: Secondary | ICD-10-CM

## 2022-10-08 DIAGNOSIS — E1149 Type 2 diabetes mellitus with other diabetic neurological complication: Secondary | ICD-10-CM | POA: Insufficient documentation

## 2022-10-08 DIAGNOSIS — E271 Primary adrenocortical insufficiency: Secondary | ICD-10-CM

## 2022-10-08 DIAGNOSIS — C7490 Malignant neoplasm of unspecified part of unspecified adrenal gland: Secondary | ICD-10-CM

## 2022-10-08 DIAGNOSIS — C7931 Secondary malignant neoplasm of brain: Secondary | ICD-10-CM | POA: Insufficient documentation

## 2022-10-08 DIAGNOSIS — R350 Frequency of micturition: Secondary | ICD-10-CM

## 2022-10-08 NOTE — Progress Notes (Signed)
Location:  Other [place] Room Number: [place] of Service:  SNF [phone]) Provider:  Dr. Dewayne Shorter  Patient Care Team: Tonia Ghent, MD as PCP - General (Family Medicine) [name], MD as Referring Physician (Neurology) [name], MD as Physician Assistant (Endocrinology)  Extended Emergency Contact Information Primary Emergency Contact: Tony Huffman, Tony Huffman Mobile Phone: [phone] Relation: Spouse  Code Status:  DNR Goals of care: Advanced Directive information    10/08/2022   10:41 AM  Advanced Directives  Does Patient Have a Medical Advance Directive? Yes  Type of Advance Directive Out of facility DNR (pink MOST or yellow form)  Does patient want to make changes to medical advance directive? No - Patient declined     Chief Complaint  Patient presents with   Medical Management of Chronic Issues    Medical Management of Chronic Issues.     HPI:  Pt is a 77 y.o. male seen today for medical management of chronic diseases.  Patient states he is doing well today. Earlier had a visit from a fellow resident.   He says it is currently Thursday, November 2023. He is eating well. Sleeping fine. And is ready for his procedure later this week.   Spoke with his patient's wife who is wondering if patient should have stress dose steroids given his underlying cancer and upcoming procedure.   Received message from PCP with concern patient could benefit from stress dose steroids and would like the input of his endocinologist. Called Dr. Joycie Peek [facility]) office and unable to discuss with physician, however, left a message and call back number.    Past Medical History:  Diagnosis Date   Adrenal insufficiency (HCC)    Anxiety    Chronic kidney disease, stage 3 (HCC) 11/13/2011   Erectile dysfunction    GERD (gastroesophageal reflux disease)    Gout 09/04/2013   H/O atrial flutter 03/07/2017   Hyperlipidemia 06/16/2014    Hypertension    Hypothyroidism (acquired) 04/14/2014   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[place]) 11/10/2010   OSA (obstructive sleep apnea) 07/24/2016   Pancreatic mass 08/01/2018   Renal carcinoma ([place]) 06/16/2015   RLS (restless legs syndrome) 07/24/2016   Sleep apnea    Testicular hypofunction    Tussive syncopes 10/15/2013   Type 2 diabetes mellitus ([place])    Vitamin D deficiency disease    Past Surgical History:  Procedure Laterality Date   ADRENALECTOMY     CATARACT EXTRACTION  10/2014   CATARACT EXTRACTION EXTRACAPSULAR  11/05/2016   with Insertion Intraocular Prostheis.    COLONOSCOPY  05/06/2017   with removal lesions by snare.   CYSTECTOMY     Upper Neck/Chest Area, [facility]   NEPHRECTOMY     POPLITEAL SYNOVIAL CYST EXCISION     SPINE SURGERY  1989   THYROIDECTOMY     patient states they only removed half   TONSILLECTOMY      Allergies  Allergen Reactions   Other Rash    Blisters with bandaids Blisters with bandaids    Tegaderm Ag Mesh [Silver]    Tape Rash    Blisters, rash. Paper tape OK. Blisters, rash. Paper tape OK.     Outpatient Encounter Medications as of 10/08/2022  Medication Sig   acetaminophen (TYLENOL) 325 MG tablet Take 650 mg by mouth 3 (three) times daily.   Cholecalciferol (VITAMIN D3) 125 MCG (5000 UT) CAPS Take 1 capsule by mouth daily.   colchicine 0.6 MG tablet  Take 1 tablet (0.6 mg total) by mouth as needed. First sign of Gout Flare. Take 2 tablets ( 1.'2mg'$ ) by mouth at first sign of gout flare followed by 1 tablet ( 0.'6mg'$ ) after 1 hour. ( max 1.'8mg'$  within 1 hours)   cyanocobalamin 1000 MCG tablet Take 1,000 mcg by mouth every other day.   fludrocortisone (FLORINEF) 0.1 MG tablet Give 0.5 tablet by mouth one time a day on Tue, Thurs. 'Sunday and Give One tablet by mouth one time a day on Mon,Wed,Fri and Sat.   gabapentin (NEURONTIN) 300 MG capsule Take 900 mg by mouth at bedtime.   hydrocortisone (CORTEF) 10 MG tablet Take 1  tablets (15mg) by mouth every morning,  tablet (5mg) by mouth daily at lunchtime and take  tablet (5mg) by mouth every evening at dinner   levothyroxine (SYNTHROID) 75 MCG tablet Take 75 mcg by mouth daily.   LORazepam (ATIVAN) 0.5 MG tablet Take 1 tablet (0.5 mg total) by mouth every 8 (eight) hours as needed for anxiety or seizure.   LORazepam (ATIVAN) 1 MG tablet Take 1 tablet (1 mg total) by mouth every 8 (eight) hours as needed for seizure (if 0.5 mg is insufficient).   metFORMIN (GLUCOPHAGE) 500 MG tablet Take 500 mg by mouth 2 (two) times daily with a meal.   mirabegron ER (MYRBETRIQ) 50 MG TB24 tablet Take 1 tablet (50 mg total) by mouth daily.   morphine 20 MG/5ML solution Take 1.3 mLs (5.2 mg total) by mouth every 4 (four) hours as needed for pain (or shortness of breath.).   Multiple Vitamins-Minerals (PRESERVISION AREDS 2 PO) Take 1 tablet by mouth in the morning and at bedtime.   PSYLLIUM HUSK PO Take 3.4 g by mouth every evening.   No facility-administered encounter medications on file as of 10/08/2022.    Review of Systems  All other systems reviewed and are negative.   Immunization History  Administered Date(s) Administered   Hepatitis B, adult 06/18/2016, 07/19/2016, 06/18/2017   Influenza, High Dose Seasonal PF 06/18/2017, 06/18/2018, 07/09/2022   Influenza,inj,Quad PF,6+ Mos 06/09/2014, 06/15/2015, 06/18/2016   Influenza-Unspecified 06/09/2014, 06/15/2015, 06/18/2016, 06/18/2017, 06/16/2019   Moderna Covid-19 Vaccine Bivalent Booster 18yrs & up 06/12/2021, 02/06/2022   Moderna Sars-Covid-2 Vaccination 10/13/2019, 11/10/2019, 05/17/2020, 01/02/2021   Pneumococcal Conjugate-13 05/13/2014, 05/31/2019   Pneumococcal Polysaccharide-23 05/04/2011   Tdap 06/03/2013   Zoster, Live 11/30/2007   Pertinent  Health Maintenance Due  Topic Date Due   FOOT EXAM  Never done   OPHTHALMOLOGY EXAM  Never done   HEMOGLOBIN A1C  01/28/2023   INFLUENZA VACCINE  Completed       12'$ /19/2023    9:55 AM 09/19/2022   12:45 AM 09/19/2022   11:00 AM 09/19/2022    8:00 PM 09/20/2022    8:30 AM  Fall Risk  Patient Fall Risk Level High fall risk High fall risk High fall risk High fall risk High fall risk   Functional Status Survey:    Vitals:   10/08/22 1024  BP: 121/73  Pulse: 72  Resp: 20  Temp: 97.6 F (36.4 C)  SpO2: 96%  Weight: 207 lb 12.8 oz (94.3 kg)  Height: '5\' 8"'$  (1.727 m)   Body mass index is 31.6 kg/m. Physical Exam Vitals reviewed.  Constitutional:      Appearance: Normal appearance.  Cardiovascular:     Rate and Rhythm: Normal rate and regular rhythm.  Pulmonary:     Effort: Pulmonary effort is normal.     Breath sounds:  Normal breath sounds.  Abdominal:     General: Bowel sounds are normal.     Palpations: Abdomen is soft.  Skin:    General: Skin is warm and dry.     Comments: 1cm nodule on occipital scalp with dried, crusted blood on top.   Neurological:     Mental Status: He is alert. Mental status is at baseline. He is disoriented.     Labs reviewed: Recent Labs    06/19/22 1045 07/29/22 0905 102/18/2023 0959  NA 134* 137 134*  K 4.9 4.2 4.7  CL 103 103 101  CO2 '25 25 22  '$ GLUCOSE 181* 164* 174*  BUN 24* 24* 22  CREATININE 0.94 1.02 1.05  CALCIUM 8.8* 9.3 9.2   Recent Labs    06/19/22 1045 07/29/22 0905 102/18/2023 0959  AST '25 21 24  '$ ALT '22 23 21  '$ ALKPHOS 82 86 79  BILITOT 0.6 0.9 0.9  PROT 7.0 7.2 6.9  ALBUMIN 3.9 3.9 3.8   Recent Labs    06/19/22 1045 07/29/22 0905 08/06/22 1112 102/18/2023 0959  WBC 8.0 6.4 8.9 9.4  NEUTROABS 5.4 3.6  --  6.5  HGB 13.7 14.1 13.6 13.2  HCT 41.4 43.1 40.8 41.1  MCV 84.3 82.1 82.6 83.4  PLT 264 284 296 326   Lab Results  Component Value Date   TSH 2.332 1August 04, 2023   Lab Results  Component Value Date   HGBA1C 7.2 (H) 07/29/2022   Lab Results  Component Value Date   CHOL 173 07/30/2022   HDL 31 (L) 07/30/2022   LDLCALC 90 07/30/2022   TRIG 260 (H) 07/30/2022    CHOLHDL 5.6 07/30/2022    Significant Diagnostic Results in last 30 days:  [place] CHEST SOFT TISSUE  Result Date: 1August 04, 2023 CLINICAL DATA:  Anterior superior soft tissue wound in the region of the sternum EXAM: ULTRASOUND OF CHEST SOFT TISSUES TECHNIQUE: Ultrasound examination of the chest wall soft tissues was performed in the area of clinical concern. COMPARISON:  None Available. FINDINGS: Focused ultrasound exam of the superficial anterior chest wall was performed in the region of patient concern, near the sternum. This reveals a 2.5 x 1.0 x 2.6 cm complex fluid collection in the subcutaneous tissues that appears to track superficially to the skin along its inferior margin. There is surrounding subcutaneous edema evident. IMPRESSION: 2.5 x 1.0 x 2.6 cm complex fluid collection in the superficial anterior chest wall near the sternum, at the site of patient concern. This is nonspecific by ultrasound but imaging features could be compatible with abscess. Subcutaneous edema noted in the adjacent regional soft tissues of the anterior chest wall. Electronically Signed   By: Misty Stanley M.D.   On: 1August 04, 2023 17:14   CT Head Wo Contrast  Result Date: 1August 04, 2023 CLINICAL DATA:  77 year old male with sudden severe headache. Metastatic renal cell carcinoma, known left parietal lobe metastasis. EXAM: CT HEAD WITHOUT CONTRAST TECHNIQUE: Contiguous axial images were obtained from the base of the skull through the vertex without intravenous contrast. RADIATION DOSE REDUCTION: This exam was performed according to the departmental dose-optimization program which includes automated exposure control, adjustment of the mA and/or kV according to patient size and/or use of iterative reconstruction technique. COMPARISON:  Brain MRI and head CT 07/29/2022. FINDINGS: Brain: Lobulated and hyperdense medial and superior right parietal lobe brain metastasis, with internal hemorrhage demonstrated on MRI in October, now measures 28  x 17 by 15 mm (AP by transverse by CC) versus 26 x 14 x 16  mm and October. But regional vasogenic edema has substantially increased and is moderate. Still, there is only mild mass effect on the left lateral ventricle. No intraventricular hemorrhage.  No extra-axial hemorrhage. Underlying generalized cerebral volume loss. No midline shift. Basilar cisterns remain patent. Outside of the left parietal lobe gray-white differentiation appears stable. No cortically based acute infarct identified. Vascular: Calcified atherosclerosis at the skull base. No suspicious intracranial vascular hyperdensity. Skull: No acute or suspicious osseous lesion identified. Sinuses/Orbits: Visualized paranasal sinuses and mastoids are stable and well aerated. Other: No acute orbit or scalp soft tissue finding. IMPRESSION: 1. Essentially stable size of a lobulated 2.8 cm left parietal lobe Metastasis since October, but increased and moderate regional Vasogenic Edema since that time. Mild associated mass effect on the left lateral ventricle. 2. But no midline shift. No new metastatic disease evident by noncontrast CT (MRI without and with contrast would be most sensitive and specific). No new intracranial abnormality identified. Electronically Signed   By: Genevie Ann M.D.   On: 109/14/23 10:52   DG Chest 2 View  Result Date: 109/14/23 CLINICAL DATA:  Confusion. History of metastatic renal cell carcinoma. EXAM: CHEST - 2 VIEW COMPARISON:  PET-CT 07/05/2022 FINDINGS: The cardiac silhouette, mediastinal and hilar contours are stable. Stable pulmonary nodules consistent with known metastatic disease. No superimposed infiltrates or effusions. The bony thorax is grossly intact. IMPRESSION: 1. No acute cardiopulmonary findings. 2. Stable pulmonary nodules consistent with known metastatic disease. Electronically Signed   By: Marijo Sanes M.D.   On: 109/14/23 09:43    Assessment/Plan 1. Adrenal insufficiency (Addison's disease) ([place]) 2.  Renal cell carcinoma, left (HCC) 3. 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 Intraparenchymal hemorrhage of brain ([place]) 5. Malignant neoplasm of adrenal gland, unspecified laterality, unspecified part ([place]) 6. 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 Pancreatic mass Patient with some continued symptoms, however, they wax and wain. Discussed with PCP concern that patient's dizziness, and confusion could improve with stress-dose steroids. Discussed with patient and wife, and agree, at this time it is best to give stress dose steroids. Will double dose of fludrocortisone and hydrocortisone until post procedure. If needed, will triple dose of steroids. Will follow up with endocrinologists to see whether or not triple dosing is indicated at this time for removal of basal cell carcinoma. Continue PRN morphine and ativan for comfort measures.   8. Atrial fibrillation and flutter (HCC) Rate well-controlled. No AC due to underlying mets.   9. Type 2 diabetes mellitus with neurological complications (HCC) Continue Metformin 500 mg BID. Continue gabapentin 900 mg nightly.   10. Hospice care Continue comfort meds as above. No additional PT at this time. Requires SNF level of care.   11. Hypothyroidism  Continue levothyroxine 75 mcg daily  12. BPH w/ frequency Continue myrbetriq 50 mg nightly.    Family/ staff Communication: wife, nursing  Labs/tests ordered:  none  I spent >30 minutes for the care of this patient with time spent face to face with this patient, chart review and care coordination.   Tony Rand, MD, [facility] [phone]

## 2022-10-08 NOTE — H&P (View-Only) (Signed)
Subjective:   CC: Basal cell carcinoma (BCC) of scalp [C44.41], chest wall abscess  HPI: Tony Huffman is a 77 y.o. male who Returns for issues above. No further concerns with the chest wall abscess noted during recent hospitalization. He does have a new complaint of a lump on the back of his head that occasionally bleeds. Not painful but increasing in size as well. Past Medical History: has a past medical history of A-fib (CMS-HCC), Adrenal insufficiency (CMS-HCC), Anxiety, Atrial flutter (CMS-HCC), Cancer (CMS-HCC), Cataract cortical, senile, Chronic gouty arthritis, CKD (chronic kidney disease) stage 3, GFR 30-59 ml/min (CMS-HCC), ED (erectile dysfunction), GERD (gastroesophageal reflux disease), Gout, Hearing loss, Hyperlipidemia, Hypertriglyceridemia, Hypothyroidism, postsurgical, Malignant neoplasm of unspecified kidney, except renal pelvis (CMS-HCC) (06/11/2016), Neuropathy, OSA (obstructive sleep apnea), RLS (restless legs syndrome) (07/24/2016), Sleep apnea, Testicular hypofunction (06/11/2016), Type 2 diabetes mellitus (CMS-HCC), and Vitamin D deficiency disease.  Past Surgical History: has a past surgical history that includes Laminectomy Lumbar Spine; Cataract extraction (Left, 10/2014); Tonsillectomy; lens eye surgery; extraction cataract extracapsular w/insertion intraocular prosthesis (Right, 11/05/2016); partial thyroidectomy; Thyroidectomy; Nephrectomy (Left); Adrenalectomy (Bilateral); Popliteal synovial cyst excision; colonoscopy w/removal lesions by snare (N/A, 05/06/2017); colonoscopy w/biopsy (N/A, 05/06/2017); Spine surgery (1989); Blepharoplasty (2018); Upper gastrointestinal endoscopy (N/A, 06/06/2018); Cardiac surgery (2014); Back surgery; Eye surgery; and pr exc skin malig 2.1-3 cm face,facial (Right, 10/08/2018).  Family History: family history includes Cancer in his father, mother, and sister; Coronary Artery Disease (Blocked arteries around heart) in his brother, father, and paternal  grandfather; Diabetes in his brother; Diabetes type II in his brother; Heart disease in his brother and father; High blood pressure (Hypertension) in his father; Macular degeneration in his father; Myocardial Infarction (Heart attack) in his brother, maternal grandmother, and paternal uncle; Ovarian cancer in his mother.  Social History: reports that he quit smoking about 46 years ago. His smoking use included cigarettes. He started smoking about 56 years ago. He has a 10 pack-year smoking history. He has never used smokeless tobacco. He reports that he does not currently use alcohol. He reports that he does not use drugs.  Current Medications: has a current medication list which includes the following prescription(s): accu-chek softclix lancets, acetaminophen, accu-chek aviva plus test strp, cholecalciferol, colchicine, cyanocobalamin, cyclobenzaprine, fludrocortisone, gabapentin, gabapentin, hydrocortisone, solu-cortef, levothyroxine, metformin, mirabegron, ondansetron, psyllium husk, and vit c/e/zn/coppr/lutein/zeaxan.  Allergies:  Allergies  Allergen Reactions  Adhesive Tape-Silicones Rash  Blisters with bandaids  Adhesive Rash  Blisters, rash. Paper tape OK.   ROS:  A 15 point review of systems was performed and pertinent positives and negatives noted in HPI  Objective:    BP 128/79  Pulse 77  Ht 172.7 cm ('5\' 8"'$ )  Wt 94.3 kg (208 lb)  BMI 31.63 kg/m   Constitutional : No distress, cooperative, alert  Lymphatics/Throat: Supple with no lymphadenopathy  Respiratory: Clear to auscultation bilaterally  Cardiovascular: Regular rate and rhythm  Gastrointestinal: Soft, non-tender, non-distended, no organomegaly.  Musculoskeletal: Steady movement  Skin: Cool and moist. Chest abscess heal with no indication of continuing infection. No obvious recurrence of cyst either. 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 Chest wall abscess- healed  Plan:    1. Basal  cell carcinoma (BCC) of scalp [C44.41] Discussed surgical excision As long as hospice services okay to continue.. Alternatives include continued observation. Benefits include possible symptom relief, pathologic evaluation, improved cosmesis. Discussed the risk of surgery including recurrence, chronic pain, post-op infxn, poor cosmesis, poor/delayed wound healing, and possible re-operation to address said risks. The risks of general anesthetic, if used, includes MI, CVA, sudden death or even reaction to anesthetic medications also discussed.  Typical post-op recovery time of 3-5 days with possible activity restrictions were also discussed.  The patient verbalized understanding and all questions were answered to the patient's satisfaction.  2. Patient has elected to proceed with surgical treatment. Procedure will be scheduled. Left lateral positioning. Will schedule once hospice approves to proceed.  labs/images/medications/previous chart entries reviewed personally and relevant changes/updates noted above.

## 2022-10-08 NOTE — H&P (Signed)
Subjective:   CC: Basal cell carcinoma (BCC) of scalp [C44.41], chest wall abscess  HPI: Tony Huffman is a 77 y.o. male who Returns for issues above. No further concerns with the chest wall abscess noted during recent hospitalization. He does have a new complaint of a lump on the back of his head that occasionally bleeds. Not painful but increasing in size as well. Past Medical History: has a past medical history of A-fib (CMS-HCC), Adrenal insufficiency (CMS-HCC), Anxiety, Atrial flutter (CMS-HCC), Cancer (CMS-HCC), Cataract cortical, senile, Chronic gouty arthritis, CKD (chronic kidney disease) stage 3, GFR 30-59 ml/min (CMS-HCC), ED (erectile dysfunction), GERD (gastroesophageal reflux disease), Gout, Hearing loss, Hyperlipidemia, Hypertriglyceridemia, Hypothyroidism, postsurgical, Malignant neoplasm of unspecified kidney, except renal pelvis (CMS-HCC) (06/11/2016), Neuropathy, OSA (obstructive sleep apnea), RLS (restless legs syndrome) (07/24/2016), Sleep apnea, Testicular hypofunction (06/11/2016), Type 2 diabetes mellitus (CMS-HCC), and Vitamin D deficiency disease.  Past Surgical History: has a past surgical history that includes Laminectomy Lumbar Spine; Cataract extraction (Left, 10/2014); Tonsillectomy; lens eye surgery; extraction cataract extracapsular w/insertion intraocular prosthesis (Right, 11/05/2016); partial thyroidectomy; Thyroidectomy; Nephrectomy (Left); Adrenalectomy (Bilateral); Popliteal synovial cyst excision; colonoscopy w/removal lesions by snare (N/A, 05/06/2017); colonoscopy w/biopsy (N/A, 05/06/2017); Spine surgery (1989); Blepharoplasty (2018); Upper gastrointestinal endoscopy (N/A, 06/06/2018); Cardiac surgery (2014); Back surgery; Eye surgery; and pr exc skin malig 2.1-3 cm face,facial (Right, 10/08/2018).  Family History: family history includes Cancer in his father, mother, and sister; Coronary Artery Disease (Blocked arteries around heart) in his brother, father, and paternal  grandfather; Diabetes in his brother; Diabetes type II in his brother; Heart disease in his brother and father; High blood pressure (Hypertension) in his father; Macular degeneration in his father; Myocardial Infarction (Heart attack) in his brother, maternal grandmother, and paternal uncle; Ovarian cancer in his mother.  Social History: reports that he quit smoking about 46 years ago. His smoking use included cigarettes. He started smoking about 56 years ago. He has a 10 pack-year smoking history. He has never used smokeless tobacco. He reports that he does not currently use alcohol. He reports that he does not use drugs.  Current Medications: has a current medication list which includes the following prescription(s): accu-chek softclix lancets, acetaminophen, accu-chek aviva plus test strp, cholecalciferol, colchicine, cyanocobalamin, cyclobenzaprine, fludrocortisone, gabapentin, gabapentin, hydrocortisone, solu-cortef, levothyroxine, metformin, mirabegron, ondansetron, psyllium husk, and vit c/e/zn/coppr/lutein/zeaxan.  Allergies:  Allergies  Allergen Reactions  Adhesive Tape-Silicones Rash  Blisters with bandaids  Adhesive Rash  Blisters, rash. Paper tape OK.   ROS:  A 15 point review of systems was performed and pertinent positives and negatives noted in HPI  Objective:    BP 128/79  Pulse 77  Ht 172.7 cm ('5\' 8"'$ )  Wt 94.3 kg (208 lb)  BMI 31.63 kg/m   Constitutional : No distress, cooperative, alert  Lymphatics/Throat: Supple with no lymphadenopathy  Respiratory: Clear to auscultation bilaterally  Cardiovascular: Regular rate and rhythm  Gastrointestinal: Soft, non-tender, non-distended, no organomegaly.  Musculoskeletal: Steady movement  Skin: Cool and moist. Chest abscess heal with no indication of continuing infection. No obvious recurrence of cyst either. 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 Chest wall abscess- healed  Plan:    1. Basal  cell carcinoma (BCC) of scalp [C44.41] Discussed surgical excision As long as hospice services okay to continue.. Alternatives include continued observation. Benefits include possible symptom relief, pathologic evaluation, improved cosmesis. Discussed the risk of surgery including recurrence, chronic pain, post-op infxn, poor cosmesis, poor/delayed wound healing, and possible re-operation to address said risks. The risks of general anesthetic, if used, includes MI, CVA, sudden death or even reaction to anesthetic medications also discussed.  Typical post-op recovery time of 3-5 days with possible activity restrictions were also discussed.  The patient verbalized understanding and all questions were answered to the patient's satisfaction.  2. Patient has elected to proceed with surgical treatment. Procedure will be scheduled. Left lateral positioning. Will schedule once hospice approves to proceed.  labs/images/medications/previous chart entries reviewed personally and relevant changes/updates noted above.

## 2022-10-08 NOTE — Telephone Encounter (Signed)
Dr. Gabriel Carina returned my call. I asked her again what Dr. Damita Dunnings was requesting. She stated that she hasn't seen patient since nov 2023 but stated that his BP needs to be checked when he gets dizzy. Dr. Gabriel Carina thinks he could be hypotensive during these episodes and increasing the fludrocortisone could be beneficial but may cause more swelling. She would be interested in having orthostatic vitals done on patient to see if his BP is dropping or staying the same when done. She said all of this could also be related to the brain tumor as well.

## 2022-10-09 ENCOUNTER — Encounter
Admission: RE | Admit: 2022-10-09 | Discharge: 2022-10-09 | Disposition: A | Discharge status: Home / Self Care | Payer: Medicare Other | Source: Ambulatory Visit | Attending: Surgery | Admitting: Surgery

## 2022-10-09 ENCOUNTER — Other Ambulatory Visit: Payer: Self-pay

## 2022-10-09 HISTORY — DX: Headache, unspecified: R51.9

## 2022-10-09 NOTE — Patient Instructions (Addendum)
Your procedure is scheduled on: 10/11/22 - Thursday Report to the Registration Desk on the 1st floor of the [facility]. To find out your arrival time, please call [phone] between 1PM - 3PM on: 10/10/22 - Wednesday If your arrival time is 6:00 am, do not arrive prior to that time as the [facility] entrance doors do not open until 6:00 am.  REMEMBER: Instructions that are not followed completely may result in serious medical risk, up to and including death; or upon the discretion of your surgeon and anesthesiologist your surgery may need to be rescheduled.  Do not eat food after midnight the night before surgery.  No gum chewing, lozengers or hard candies.  You may however, drink CLEAR liquids up to 2 hours before you are scheduled to arrive for your surgery. Do not drink anything within 2 hours of your scheduled arrival time.  Type 1 and Type 2 diabetics should only drink water.  TAKE THESE MEDICATIONS THE MORNING OF SURGERY WITH A SIP OF WATER:  - fludrocortisone (FLORINEF)  - hydrocortisone (CORTEF)  - mirabegron ER (MYRBETRIQ)  - acetaminophen (TYLENOL)   HOLD metFORMIN (GLUCOPHAGE) beginning 10/09/22   One week prior to surgery: Stop Anti-inflammatories (NSAIDS) such as Advil, Aleve, Ibuprofen, Motrin, Naproxen, Naprosyn and Aspirin based products such as Excedrin, Goodys Powder, BC Powder.  Stop ANY OVER THE COUNTER supplements until after surgery: Cholecalciferol (VITAMIN D3) , Multiple Vitamins-Minerals (PRESERVISION AREDS 2 PO) .  You may however, continue to take Tylenol if needed for pain up until the day of surgery.  No Alcohol for 24 hours before or after surgery.  No Smoking including e-cigarettes for 24 hours prior to surgery.  No chewable tobacco products for at least 6 hours prior to surgery.  No nicotine patches on the day of surgery.  Do not use any "recreational" drugs for at least a week prior to your surgery.  Please be advised that the  combination of cocaine and anesthesia may have negative outcomes, up to and including death. If you test positive for cocaine, your surgery will be cancelled.  On the morning of surgery brush your teeth with toothpaste and water, you may rinse your mouth with mouthwash if you wish.. Do not swallow any toothpaste or mouthwash.  Use CHG Soap or wipes as directed on instruction sheet.  Do not wear jewelry, make-up, hairpins, clips or nail polish.  Do not wear lotions, powders, or perfumes.   Do not shave body from the neck down 48 hours prior to surgery just in case you cut yourself which could leave a site for infection.  Also, freshly shaved skin may become irritated if using the CHG soap.  Contact lenses, hearing aids and dentures may not be worn into surgery.  Do not bring valuables to the hospital. [facility] is not responsible for any missing/lost belongings or valuables.   Notify your doctor if there is any change in your medical condition (cold, fever, infection).  Wear comfortable clothing (specific to your surgery type) to the hospital.  After surgery, you can help prevent lung complications by doing breathing exercises.  Take deep breaths and cough every 1-2 hours. Your doctor may order a device called an Incentive Spirometer to help you take deep breaths. When coughing or sneezing, hold a pillow firmly against your incision with both hands. This is called "splinting." Doing this helps protect your incision. It also decreases belly discomfort.  If you are being admitted to the hospital overnight, leave your suitcase  in the car. After surgery it may be brought to your room.  If you are being discharged the day of surgery, you will not be allowed to drive home. You will need a responsible adult (18 years or older) to drive you home and stay with you that night.   If you are taking public transportation, you will need to have a responsible adult (18 years or older) with  you. Please confirm with your physician that it is acceptable to use public transportation.   Please call the [facility] at [phone] if you have any questions about these instructions.  Surgery Visitation Policy:  Patients undergoing a surgery or procedure may have two family members or support persons with them as long as the person is not COVID-19 positive or experiencing its symptoms.   Inpatient Visitation:    Visiting hours are 7 a.m. to 8 p.m. Up to four visitors are allowed at one time in a patient room. The visitors may rotate out with other people during the day. One designated support person (adult) may remain overnight.  Due to an increase in RSV and influenza rates and associated hospitalizations, children ages 41 and under will not be able to visit patients in [facility]. Masks continue to be strongly recommended.

## 2022-10-09 NOTE — Pre-Procedure Instructions (Signed)
Pre op interview completed with patients wife Dequandre, [place] (Spouse) , Designated Party Release active.[place] patient as of 09/27/2022.

## 2022-10-09 NOTE — Telephone Encounter (Signed)
I am routing this to Dr Unk Lightning.  I didn't change the orders yet.    Dr. Unk Lightning- please let me know if I need to direct this change from clinic or if you can address this.  I really appreciate your help.    I think it is reasonable to check orthostatic BPs.  Assuming he doesn't have hypertensive readings, I would try increasing his fludrocortisone to 0.'1mg'$  tab a day and adjust his hydrocortisone to '15mg'$  in the AM, '10mg'$  at lunch, and '10mg'$  at dinner.    I think that a modest increase in replacement would be reasonable, if he isn't hypertensive.    Thanks.

## 2022-10-09 NOTE — Pre-Procedure Instructions (Signed)
Patient resides at [facility], Medications , Code status, and allergies verified with Nurse Roxy Cedar, pre op instructions reviewed with nurse and a copy has been faxed to the facility.

## 2022-10-11 ENCOUNTER — Encounter
Admission: RE | Disposition: A | Discharge status: Home / Self Care | Payer: Self-pay | Source: Home / Self Care | Attending: Surgery

## 2022-10-11 ENCOUNTER — Ambulatory Visit
Admission: RE | Admit: 2022-10-11 | Discharge: 2022-10-11 | Disposition: A | Discharge status: Home / Self Care | Attending: Surgery | Admitting: Surgery

## 2022-10-11 ENCOUNTER — Encounter: Payer: Self-pay | Admitting: Surgery

## 2022-10-11 ENCOUNTER — Other Ambulatory Visit: Payer: Self-pay

## 2022-10-11 ENCOUNTER — Ambulatory Visit: Admitting: Anesthesiology

## 2022-10-11 DIAGNOSIS — M109 Gout, unspecified: Secondary | ICD-10-CM | POA: Diagnosis not present

## 2022-10-11 DIAGNOSIS — E1122 Type 2 diabetes mellitus with diabetic chronic kidney disease: Secondary | ICD-10-CM | POA: Diagnosis not present

## 2022-10-11 DIAGNOSIS — K869 Disease of pancreas, unspecified: Secondary | ICD-10-CM | POA: Diagnosis not present

## 2022-10-11 DIAGNOSIS — R350 Frequency of micturition: Secondary | ICD-10-CM | POA: Diagnosis not present

## 2022-10-11 DIAGNOSIS — E271 Primary adrenocortical insufficiency: Secondary | ICD-10-CM | POA: Diagnosis not present

## 2022-10-11 DIAGNOSIS — G4730 Sleep apnea, unspecified: Secondary | ICD-10-CM | POA: Insufficient documentation

## 2022-10-11 DIAGNOSIS — C792 Secondary malignant neoplasm of skin: Secondary | ICD-10-CM | POA: Diagnosis not present

## 2022-10-11 DIAGNOSIS — C649 Malignant neoplasm of unspecified kidney, except renal pelvis: Secondary | ICD-10-CM | POA: Insufficient documentation

## 2022-10-11 DIAGNOSIS — N183 Chronic kidney disease, stage 3 unspecified: Secondary | ICD-10-CM | POA: Diagnosis not present

## 2022-10-11 DIAGNOSIS — E669 Obesity, unspecified: Secondary | ICD-10-CM | POA: Diagnosis not present

## 2022-10-11 DIAGNOSIS — N401 Enlarged prostate with lower urinary tract symptoms: Secondary | ICD-10-CM | POA: Diagnosis not present

## 2022-10-11 DIAGNOSIS — Z87891 Personal history of nicotine dependence: Secondary | ICD-10-CM | POA: Insufficient documentation

## 2022-10-11 DIAGNOSIS — C7970 Secondary malignant neoplasm of unspecified adrenal gland: Secondary | ICD-10-CM | POA: Insufficient documentation

## 2022-10-11 DIAGNOSIS — E039 Hypothyroidism, unspecified: Secondary | ICD-10-CM | POA: Diagnosis not present

## 2022-10-11 DIAGNOSIS — C7802 Secondary malignant neoplasm of left lung: Secondary | ICD-10-CM | POA: Diagnosis not present

## 2022-10-11 DIAGNOSIS — Z993 Dependence on wheelchair: Secondary | ICD-10-CM | POA: Diagnosis not present

## 2022-10-11 DIAGNOSIS — I4892 Unspecified atrial flutter: Secondary | ICD-10-CM | POA: Diagnosis not present

## 2022-10-11 DIAGNOSIS — K219 Gastro-esophageal reflux disease without esophagitis: Secondary | ICD-10-CM | POA: Insufficient documentation

## 2022-10-11 DIAGNOSIS — I4891 Unspecified atrial fibrillation: Secondary | ICD-10-CM | POA: Diagnosis not present

## 2022-10-11 DIAGNOSIS — I120 Hypertensive chronic kidney disease with stage 5 chronic kidney disease or end stage renal disease: Secondary | ICD-10-CM | POA: Insufficient documentation

## 2022-10-11 DIAGNOSIS — C4441 Basal cell carcinoma of skin of scalp and neck: Secondary | ICD-10-CM | POA: Diagnosis present

## 2022-10-11 DIAGNOSIS — Z6831 Body mass index (BMI) 31.0-31.9, adult: Secondary | ICD-10-CM | POA: Insufficient documentation

## 2022-10-11 DIAGNOSIS — C7931 Secondary malignant neoplasm of brain: Secondary | ICD-10-CM | POA: Diagnosis not present

## 2022-10-11 HISTORY — PX: LESION EXCISION: SHX5167

## 2022-10-11 LAB — GLUCOSE, CAPILLARY
Glucose-Capillary: 153 mg/dL — ABNORMAL HIGH (ref 70–99)
Glucose-Capillary: 147 mg/dL — ABNORMAL HIGH (ref 70–99)

## 2022-10-11 SURGERY — EXCISION, LESION, SCALP
Anesthesia: General | Site: Scalp

## 2022-10-11 MED ORDER — DOCUSATE SODIUM 100 MG PO CAPS: 100.0000 mg | ORAL_CAPSULE | Freq: Two times a day (BID) | ORAL | 0 refills | Status: AC | PRN

## 2022-10-11 MED ORDER — CEFAZOLIN SODIUM-DEXTROSE 2-4 GM/100ML-% IV SOLN
INTRAVENOUS | Status: AC
  Filled 2022-10-11: qty 100

## 2022-10-11 MED ORDER — HYDROCORTISONE SOD SUC (PF) 1000 MG IJ SOLR
INTRAMUSCULAR | Status: DC | PRN
  Administered 2022-10-11: 125 mg via INTRAVENOUS

## 2022-10-11 MED ORDER — ORAL CARE MOUTH RINSE: 15.0000 mL | Freq: Once | OROMUCOSAL | Status: AC

## 2022-10-11 MED ORDER — OXYCODONE HCL 5 MG PO TABS: 5.0000 mg | ORAL_TABLET | Freq: Once | ORAL | Status: DC | PRN

## 2022-10-11 MED ORDER — CHLORHEXIDINE GLUCONATE 0.12 % MT SOLN
15.0000 mL | Freq: Once | OROMUCOSAL | Status: AC
  Administered 2022-10-11: 15 mL via OROMUCOSAL

## 2022-10-11 MED ORDER — PROPOFOL 10 MG/ML IV BOLUS
INTRAVENOUS | Status: DC | PRN
  Administered 2022-10-11: 10 mg via INTRAVENOUS

## 2022-10-11 MED ORDER — LACTATED RINGERS IV SOLN: INTRAVENOUS | Status: DC

## 2022-10-11 MED ORDER — ONDANSETRON HCL 4 MG/2ML IJ SOLN: 4.0000 mg | Freq: Once | INTRAMUSCULAR | Status: DC | PRN

## 2022-10-11 MED ORDER — FENTANYL CITRATE (PF) 100 MCG/2ML IJ SOLN
INTRAMUSCULAR | Status: AC
  Filled 2022-10-11: qty 2

## 2022-10-11 MED ORDER — MIDAZOLAM HCL 2 MG/2ML IJ SOLN
INTRAMUSCULAR | Status: DC | PRN
  Administered 2022-10-11 (×2): 1 mg via INTRAVENOUS

## 2022-10-11 MED ORDER — OXYCODONE HCL 5 MG/5ML PO SOLN: 5.0000 mg | Freq: Once | ORAL | Status: DC | PRN

## 2022-10-11 MED ORDER — PROPOFOL 10 MG/ML IV BOLUS
INTRAVENOUS | Status: AC
  Filled 2022-10-11: qty 60

## 2022-10-11 MED ORDER — BUPIVACAINE-EPINEPHRINE (PF) 0.5% -1:200000 IJ SOLN
INTRAMUSCULAR | Status: AC
  Filled 2022-10-11: qty 30

## 2022-10-11 MED ORDER — CEFAZOLIN SODIUM-DEXTROSE 2-4 GM/100ML-% IV SOLN
2.0000 g | INTRAVENOUS | Status: AC
  Administered 2022-10-11: 2 g via INTRAVENOUS

## 2022-10-11 MED ORDER — CHLORHEXIDINE GLUCONATE CLOTH 2 % EX PADS: 6.0000 | MEDICATED_PAD | Freq: Once | CUTANEOUS | Status: DC

## 2022-10-11 MED ORDER — PROPOFOL 500 MG/50ML IV EMUL
INTRAVENOUS | Status: DC | PRN
  Administered 2022-10-11: 100 ug/kg/min via INTRAVENOUS

## 2022-10-11 MED ORDER — CHLORHEXIDINE GLUCONATE 0.12 % MT SOLN
OROMUCOSAL | Status: AC
  Filled 2022-10-11: qty 15

## 2022-10-11 MED ORDER — PHENYLEPHRINE 80 MCG/ML (10ML) SYRINGE FOR IV PUSH (FOR BLOOD PRESSURE SUPPORT)
PREFILLED_SYRINGE | INTRAVENOUS | Status: DC | PRN
  Administered 2022-10-11 (×10): 80 ug via INTRAVENOUS

## 2022-10-11 MED ORDER — PHENYLEPHRINE HCL (PRESSORS) 10 MG/ML IV SOLN
INTRAVENOUS | Status: AC
  Filled 2022-10-11: qty 1

## 2022-10-11 MED ORDER — FENTANYL CITRATE (PF) 100 MCG/2ML IJ SOLN: 25.0000 ug | INTRAMUSCULAR | Status: DC | PRN

## 2022-10-11 MED ORDER — OXYCODONE HCL 10 MG PO TABS: 10.0000 mg | ORAL_TABLET | Freq: Four times a day (QID) | ORAL | 0 refills | Status: AC | PRN

## 2022-10-11 MED ORDER — ACETAMINOPHEN 10 MG/ML IV SOLN: 1000.0000 mg | Freq: Once | INTRAVENOUS | Status: DC | PRN

## 2022-10-11 MED ORDER — FENTANYL CITRATE (PF) 100 MCG/2ML IJ SOLN
INTRAMUSCULAR | Status: DC | PRN
  Administered 2022-10-11 (×2): 25 ug via INTRAVENOUS

## 2022-10-11 MED ORDER — 0.9 % SODIUM CHLORIDE (POUR BTL) OPTIME
TOPICAL | Status: DC | PRN
  Administered 2022-10-11: 200 mL

## 2022-10-11 MED ORDER — LIDOCAINE HCL (PF) 1 % IJ SOLN
INTRAMUSCULAR | Status: AC
  Filled 2022-10-11: qty 30

## 2022-10-11 MED ORDER — BUPIVACAINE-EPINEPHRINE 0.5% -1:200000 IJ SOLN
INTRAMUSCULAR | Status: DC | PRN
  Administered 2022-10-11: 10 mL

## 2022-10-11 MED ORDER — LIDOCAINE HCL (PF) 2 % IJ SOLN
INTRAMUSCULAR | Status: AC
  Filled 2022-10-11: qty 15

## 2022-10-11 MED ORDER — MIDAZOLAM HCL 2 MG/2ML IJ SOLN
INTRAMUSCULAR | Status: AC
  Filled 2022-10-11: qty 2

## 2022-10-11 MED ORDER — PROPOFOL 1000 MG/100ML IV EMUL
INTRAVENOUS | Status: AC
  Filled 2022-10-11: qty 300

## 2022-10-11 SURGICAL SUPPLY — 32 items
BLADE CLIPPER SPEC (BLADE) IMPLANT
BLADE SURG 15 STRL LF DISP TIS (BLADE) ×1 IMPLANT
BLADE SURG 15 STRL SS (BLADE) ×1
DERMABOND ADVANCED .7 DNX12 (GAUZE/BANDAGES/DRESSINGS) ×1 IMPLANT
DRAPE LAPAROTOMY 77X122 PED (DRAPES) ×1 IMPLANT
ELECT CAUTERY BLADE 6.4 (BLADE) ×1 IMPLANT
ELECT REM PT RETURN 9FT ADLT (ELECTROSURGICAL) ×1
ELECTRODE REM PT RTRN 9FT ADLT (ELECTROSURGICAL) ×1 IMPLANT
GAUZE SPONGE 4X4 12PLY STRL (GAUZE/BANDAGES/DRESSINGS) IMPLANT
GLOVE BIOGEL PI IND STRL 7.0 (GLOVE) ×1 IMPLANT
GLOVE SURG SYN 6.5 ES PF (GLOVE) ×1 IMPLANT
GLOVE SURG SYN 6.5 PF PI (GLOVE) ×1 IMPLANT
GOWN STRL REUS W/ TWL LRG LVL3 (GOWN DISPOSABLE) ×2 IMPLANT
GOWN STRL REUS W/TWL LRG LVL3 (GOWN DISPOSABLE) ×4
KIT TURNOVER KIT A (KITS) ×1 IMPLANT
LABEL OR SOLS (LABEL) ×1 IMPLANT
MANIFOLD NEPTUNE II (INSTRUMENTS) ×1 IMPLANT
NEEDLE HYPO 22GX1.5 SAFETY (NEEDLE) ×1 IMPLANT
NS IRRIG 500ML POUR BTL (IV SOLUTION) ×1 IMPLANT
PACK BASIN MINOR ARMC (MISCELLANEOUS) ×1 IMPLANT
SOL PREP PVP 2OZ (MISCELLANEOUS) ×2
SOLUTION PREP PVP 2OZ (MISCELLANEOUS) ×1 IMPLANT
STAPLER SKIN PROX 35W (STAPLE) IMPLANT
SUT MNCRL 4-0 (SUTURE) ×1
SUT MNCRL 4-0 27XMFL (SUTURE) ×1
SUT VIC AB 3-0 SH 27 (SUTURE) ×1
SUT VIC AB 3-0 SH 27X BRD (SUTURE) ×1 IMPLANT
SUTURE MNCRL 4-0 27XMF (SUTURE) ×1 IMPLANT
SYR 30ML LL (SYRINGE) ×1 IMPLANT
SYR BULB IRRIG 60ML STRL (SYRINGE) ×1 IMPLANT
TRAP FLUID SMOKE EVACUATOR (MISCELLANEOUS) ×1 IMPLANT
WATER STERILE IRR 500ML POUR (IV SOLUTION) ×1 IMPLANT

## 2022-10-11 NOTE — Anesthesia Preprocedure Evaluation (Addendum)
Anesthesia Evaluation  Patient identified by MRN, date of birth, ID band Patient awake    Reviewed: Allergy & Precautions, NPO status , Patient's Chart, lab work & pertinent test results  History of Anesthesia Complications Negative for: history of anesthetic complications  Airway Mallampati: IV   Neck ROM: Full    Dental no notable dental hx.    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 Adrenal insufficiency (Addison's disease) ([place]) 2. Renal cell carcinoma, left (HCC) 3. 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 Intraparenchymal hemorrhage of brain ([employer]) 5. Malignant neoplasm of adrenal gland, unspecified laterality, unspecified part ([place]) 6. 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) 7. Pancreatic mass Patient with some continued symptoms, however, they wax and wain. Discussed with PCP concern that patient's dizziness, and confusion could improve with stress-dose steroids. Discussed with patient and wife, and agree, at this time it is best to give stress dose steroids. Will double dose of fludrocortisone and hydrocortisone until post procedure. If needed, will triple dose of steroids. Will follow up with  endocrinologists to see whether or not triple dosing is indicated at this time for removal of basal cell carcinoma. Continue PRN morphine and ativan for comfort measures.    8. Atrial fibrillation and flutter (HCC) Rate well-controlled. No AC due to underlying mets.    9. Type 2 diabetes mellitus with neurological complications (HCC) Continue Metformin 500 mg BID. Continue gabapentin 900 mg nightly.    10. Hospice care Continue comfort meds as above. No additional PT at this time. Requires SNF level of care.    11. Hypothyroidism  Continue levothyroxine 75 mcg daily   12. BPH w/ frequency Continue myrbetriq 50 mg nightly.    Reproductive/Obstetrics                             Anesthesia Physical Anesthesia Plan  ASA: 3  Anesthesia Plan: General   Post-op Pain Management:    Induction: Intravenous  PONV Risk Score and Plan: 2 and Propofol infusion, TIVA and Treatment may vary due to age or medical condition  Airway Management Planned: Natural Airway  Additional Equipment:   Intra-op Plan:   Post-operative Plan:   Informed Consent: I have reviewed the patients History and Physical, chart, labs and discussed the procedure including the risks, benefits and alternatives for the proposed anesthesia with the patient or authorized representative who has indicated his/her understanding and acceptance.   Patient has DNR.  Discussed DNR with patient and Suspend DNR.     Plan Discussed with: CRNA  Anesthesia Plan Comments: (LMA/GETA backup discussed.  Patient consented for risks of anesthesia including but not limited to:  - adverse reactions to medications - damage to eyes, teeth, lips or other oral mucosa - nerve damage  due to positioning  - sore throat or hoarseness - damage to heart, brain, nerves, lungs, other parts of body or loss of life  Informed patient about role of CRNA in peri- and intra-operative care.  Patient voiced understanding.)         Anesthesia Quick Evaluation

## 2022-10-11 NOTE — Transfer of Care (Signed)
Immediate Anesthesia Transfer of Care Note  Patient: Tony Huffman  Procedure(s) Performed: EXCISION SCALP LESION (Scalp)  Patient Location: PACU  Anesthesia Type:General  Level of Consciousness: awake and oriented  Airway & Oxygen Therapy: Patient Spontanous Breathing and Patient connected to face mask oxygen  Post-op Assessment: Report given to RN, Post -op Vital signs reviewed and stable, and Patient moving all extremities  Post vital signs: Reviewed and stable  Last Vitals:  Vitals Value Taken Time  BP 116/72 10/11/22 0835  Temp    Pulse 76 10/11/22 0840  Resp 25 10/11/22 0840  SpO2 100 % 10/11/22 0840  Vitals shown include unvalidated device data.  Last Pain:  Vitals:   10/11/22 0626  TempSrc: Oral  PainSc: 0-No pain         Complications: No notable events documented.

## 2022-10-11 NOTE — Discharge Instructions (Addendum)
Removal, Care After This sheet gives you information about how to care for yourself after your procedure. Your health care provider may also give you more specific instructions. If you have problems or questions, contact your health care provider. What can I expect after the procedure? After the procedure, it is common to have: Soreness. Bruising. Itching. Follow these instructions at home: site care Follow instructions from your health care provider about how to take care of your site. Make sure you: Wash your hands with soap and water before and after you change your bandage (dressing). If soap and water are not available, use hand sanitizer. Leave stitches (sutures), skin glue, or adhesive strips in place. These skin closures may need to stay in place for 2 weeks or longer. If adhesive strip edges start to loosen and curl up, you may trim the loose edges. Do not remove adhesive strips completely unless your health care provider tells you to do that. If the area bleeds or bruises, apply gentle pressure for 10 minutes. OK TO SHOWER IN 48HRS AFTER REMOVING OUTER DRESSING.  KEEP STAPLES COVERED AND CHANGE GAUZE DAILY UNTIL FOLLOWUP APPT  Check your site every day for signs of infection. Check for: Redness, swelling, or pain. Fluid or blood. Warmth. Pus or a bad smell.  General instructions Rest and then return to your normal activities as told by your health care provider.  tylenol and advil as needed for discomfort.  Please alternate between the two every four hours as needed for pain.    Use narcotics, if prescribed, only when tylenol and motrin is not enough to control pain.  325-'650mg'$  every 8hrs to max of '3000mg'$ /24hrs (including the '325mg'$  in every norco dose) for the tylenol.    Advil up to '800mg'$  per dose every 8hrs as needed for pain.   Keep all follow-up visits as told by your health care provider. This is important. Contact a health care provider if: You have redness, swelling, or  pain around your site. You have fluid or blood coming from your site. Your site feels warm to the touch. You have pus or a bad smell coming from your site. You have a fever. Your sutures, skin glue, or adhesive strips loosen or come off sooner than expected. Get help right away if: You have bleeding that does not stop with pressure or a dressing. Summary After the procedure, it is common to have some soreness, bruising, and itching at the site. Follow instructions from your health care provider about how to take care of your site. Check your site every day for signs of infection. Contact a health care provider if you have redness, swelling, or pain around your site, or your site feels warm to the touch. Keep all follow-up visits as told by your health care provider. This is important. This information is not intended to replace advice given to you by your health care provider. Make sure you discuss any questions you have with your health care provider. Document Released: 10/14/2015 Document Revised: 03/17/2018 Document Reviewed: 03/17/2018 Elsevier Interactive Patient Education  2019 [place]   The drugs that you were given will stay in your system until tomorrow so for the next 24 hours you should not:  Drive an automobile Make any legal decisions Drink any alcoholic beverage   You may resume regular meals tomorrow.  Today it is better to start with liquids and gradually work up to solid foods.  You may eat anything you  prefer, but it is better to start with liquids, then soup and crackers, and gradually work up to solid foods.   Please notify your doctor immediately if you have any unusual bleeding, trouble breathing, redness and pain at the surgery site, drainage, fever, or pain not relieved by medication.     Additional Instructions:

## 2022-10-11 NOTE — Anesthesia Postprocedure Evaluation (Signed)
Anesthesia Post Note  Patient: Masin Shatto  Procedure(s) Performed: EXCISION SCALP LESION (Scalp)  Patient location during evaluation: PACU Anesthesia Type: General Level of consciousness: awake and alert, oriented and patient cooperative Pain management: pain level controlled Vital Signs Assessment: post-procedure vital signs reviewed and stable Respiratory status: spontaneous breathing, nonlabored ventilation and respiratory function stable Cardiovascular status: blood pressure returned to baseline and stable Postop Assessment: adequate PO intake Anesthetic complications: no   No notable events documented.   Last Vitals:  Vitals:   10/11/22 0900 10/11/22 0918  BP: 114/69 118/68  Pulse: 80 77  Resp: 11 16  Temp: 36.7 C 36.5 C  SpO2: 98% 98%    Last Pain:  Vitals:   10/11/22 0918  TempSrc: Temporal  PainSc: 0-No pain                 Darrin Nipper

## 2022-10-11 NOTE — Op Note (Addendum)
Pre-Op Dx: BCC of scalp Post-Op Dx: same Anesthesia: MAC EBL: 53YY Complications:  none apparent Specimen: scalp mass Procedure: excisional biopsy of BCC, scalp Surgeon: Lysle Pearl  Indications for procedure: See H&P  Description of Procedure:  Consent obtained, time out performed.  Patient placed in right lateral position.  Area sterilized and draped in usual position.  Local infused to area previously marked.  4cm incision made through dermis with 15blade around the mass, likley BCC. The  3.2cm x 4cm x 2cm mass then removed from surrounding tissue completely using electrocautery, passed off field pending pathology.  Wound hemostasis noted, then closed in two layer fashion with 3-0 vicryl in interrupted fashion for deep dermal layer, then staples. Wound then dressed with 4x4 gauze, paper tape.  Pt tolerated procedure well, and transferred to PACU in stable condition. Sponge and instrument count correct at end of procedure.

## 2022-10-11 NOTE — Interval H&P Note (Signed)
No change getting a proceeded. Right side down for positioning.

## 2022-10-12 ENCOUNTER — Ambulatory Visit: Payer: Medicare Other

## 2022-10-12 LAB — SURGICAL PATHOLOGY

## 2022-10-14 NOTE — Telephone Encounter (Signed)
Please see message below and update patient/wife.  I appreciate help from Dr. Unk Lightning. ================  Unk Lightning, Jordan Hawks, MD  You1 hour ago (3:13 PM)   Thank you for forwarding this information. I increased his steroids to stress dose to prepare for his procedure this week. We can check orthostatic blood pressure as well.

## 2022-10-15 NOTE — Telephone Encounter (Signed)
Called to inform pt wife of this and she stated that he has the procedure last week.

## 2022-10-19 ENCOUNTER — Ambulatory Visit: Payer: Medicare Other

## 2022-10-19 ENCOUNTER — Encounter: Payer: Self-pay | Admitting: Student

## 2022-10-19 ENCOUNTER — Non-Acute Institutional Stay (SKILLED_NURSING_FACILITY): Payer: Medicare Other | Admitting: Student

## 2022-10-19 DIAGNOSIS — C4441 Basal cell carcinoma of skin of scalp and neck: Secondary | ICD-10-CM

## 2022-10-19 NOTE — Progress Notes (Signed)
Location:  Other [place].  Nursing Home Room Number: [place] of Service:  SNF [phone]) Provider:  Dr. Dewayne Shorter.   EAV:[id number], Elveria Rising, MD  Patient Care Team: Tonia Ghent, MD as PCP - General (Family Medicine) [name], MD as Referring Physician (Neurology) [name], MD as Physician Assistant (Endocrinology)  Extended Emergency Contact Information Primary Emergency Contact: [place], [place] Mobile Phone: [phone] Relation: Spouse  Code Status:  DNR Goals of care: Advanced Directive information    10/19/2022   11:28 AM  Advanced Directives  Does Patient Have a Medical Advance Directive? Yes  Type of Paramedic of [place];Out of facility DNR (pink MOST or yellow form)  Does patient want to make changes to medical advance directive? No - Patient declined  Copy of [place] in Chart? Yes - validated most recent copy scanned in chart (See row information)     Chief Complaint  Patient presents with   Acute Visit    Wound Care    HPI:  Pt is a 77 y.o. male seen today for an acute visit for wound check. Patient had removal of basal cell carcinoma removed by dermatology ~1 week ago. Yesterday was oozing serosanguinous fluid. No drainage today.    Past Medical History:  Diagnosis Date   Adrenal insufficiency (HCC)    Anxiety    Chronic kidney disease, stage 3 (HCC) 11/13/2011   Erectile dysfunction    GERD (gastroesophageal reflux disease)    Gout 09/04/2013   H/O atrial flutter 03/07/2017   Headache    Hyperlipidemia 06/16/2014   Hypertension    Hypothyroidism (acquired) 04/14/2014   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[place]) 11/10/2010   OSA (obstructive sleep apnea) 07/24/2016   Pancreatic mass 08/01/2018   Renal carcinoma ([place]) 06/16/2015   RLS (restless legs syndrome) 07/24/2016   Sleep apnea    Testicular hypofunction    Tussive syncopes 10/15/2013   Type 2 diabetes mellitus  ([place])    Vitamin D deficiency disease    Past Surgical History:  Procedure Laterality Date   ADRENALECTOMY     CATARACT EXTRACTION  10/2014   CATARACT EXTRACTION EXTRACAPSULAR  11/05/2016   with Insertion Intraocular Prostheis.    COLONOSCOPY  05/06/2017   with removal lesions by snare.   CYSTECTOMY     Upper Neck/Chest Area, [facility]   LESION EXCISION N/A 10/11/2022   Procedure: EXCISION SCALP LESION;  Surgeon: Benjamine Sprague, DO;  Location: [facility] ORS;  Service: General;  Laterality: N/A;   NEPHRECTOMY     POPLITEAL SYNOVIAL CYST [place]     patient states they only removed half   TONSILLECTOMY      Allergies  Allergen Reactions   Other Rash    Blisters with bandaids Blisters with bandaids    Tegaderm Ag Mesh [Silver]    Tape Rash    Blisters, rash. Paper tape OK. Blisters, rash. Paper tape OK.     Outpatient Encounter Medications as of 10/19/2022  Medication Sig   acetaminophen (TYLENOL) 325 MG tablet Take 650 mg by mouth 3 (three) times daily.   Cholecalciferol (VITAMIN D3) 125 MCG (5000 UT) CAPS Take 1 capsule by mouth daily.   colchicine 0.6 MG tablet Give one tablet by mouth every 1 hour as needed for gout flare first sign of gout, take two tablets followed by one tablet within 1 hour total 1.'8mg'$  within 1  hour. Give two tablets by mouth every 24 hours as needed for gout, give at first sign of gout followed one tablet within 1 hours.   cyanocobalamin 1000 MCG tablet Take 1,000 mcg by mouth every other day.   docusate sodium (COLACE) 100 MG capsule Take 1 capsule (100 mg total) by mouth 2 (two) times daily as needed for up to 10 days for mild constipation.   fludrocortisone (FLORINEF) 0.1 MG tablet Give one tablet by mouth one time a day on Tue, Thurs. 'Sunday and Give Two tablets by mouth one time a day on Mon,Wed,Fri and Sat.   gabapentin (NEURONTIN) 300 MG capsule Take 900 mg by mouth at bedtime.   hydrocortisone  (CORTEF) 10 MG tablet Take 10 mg by mouth daily. In the afternoon. Give one tablet by mouth in the evening. Give Three tablets by mouth in the morning.   levothyroxine (SYNTHROID) 75 MCG tablet Take 75 mcg by mouth daily.   LORazepam (ATIVAN) 0.5 MG tablet Take 0.5 mg by mouth every 8 (eight) hours as needed for anxiety. For Anxiety/SOB. Give one tablet by mouth every 8 hours as needed for seizure like activity/shaking   metFORMIN (GLUCOPHAGE) 500 MG tablet Take 500 mg by mouth 2 (two) times daily with a meal.   mirabegron ER (MYRBETRIQ) 50 MG TB24 tablet Take 1 tablet (50 mg total) by mouth daily.   Morphine Sulfate (MORPHINE CONCENTRATE) 10 mg / 0.5 ml concentrated solution Take 0.25 mLs by mouth every 4 (four) hours as needed for severe pain.   Multiple Vitamins-Minerals (PRESERVISION AREDS 2 PO) Take 1 tablet by mouth in the morning and at bedtime.   PSYLLIUM HUSK PO Take 3.4 g by mouth every evening.   No facility-administered encounter medications on file as of 10/19/2022.    Review of Systems  All other systems reviewed and are negative.   Immunization History  Administered Date(s) Administered   Hepatitis B, adult 06/18/2016, 07/19/2016, 06/18/2017   Influenza, High Dose Seasonal PF 06/18/2017, 06/18/2018, 07/09/2022   Influenza,inj,Quad PF,6+ Mos 06/09/2014, 06/15/2015, 06/18/2016   Influenza-Unspecified 06/09/2014, 06/15/2015, 06/18/2016, 06/18/2017, 06/16/2019   Moderna Covid-19 Vaccine Bivalent Booster 18yrs & up 06/12/2021, 02/06/2022   Moderna Sars-Covid-2 Vaccination 10/13/2019, 11/10/2019, 05/17/2020, 01/02/2021   Pneumococcal Conjugate-13 05/13/2014, 05/31/2019   Pneumococcal Polysaccharide-23 05/04/2011   Tdap 06/03/2013   Zoster, Live 11/30/2007   Pertinent  Health Maintenance Due  Topic Date Due   FOOT EXAM  Never done   OPHTHALMOLOGY EXAM  Never done   HEMOGLOBIN A1C  01/28/2023   INFLUENZA VACCINE  Completed      12'$ /20/2023   12:45 AM 09/19/2022   11:00 AM  09/19/2022    8:00 PM 09/20/2022    8:30 AM 10/11/2022    6:29 AM  Fall Risk  (RETIRED) Patient Fall Risk Level High fall risk High fall risk High fall risk High fall risk High fall risk   Functional Status Survey:    Vitals:   10/19/22 1114  BP: 121/73  Pulse: 72  Resp: 20  Temp: 97.6 F (36.4 C)  SpO2: 96%  Weight: 210 lb (95.3 kg)  Height: '5\' 8"'$  (1.727 m)   Body mass index is 31.93 kg/m. Physical Exam Vitals reviewed.  Cardiovascular:     Rate and Rhythm: Normal rate.  Pulmonary:     Effort: Pulmonary effort is normal.  Skin:    Comments: 2cm laceration with 6 staples in place. No drainage. Erythema at the point of insertion of the staples.  Neurological:     Mental Status: He is alert and oriented to person, place, and time.     Labs reviewed: Recent Labs    06/19/22 1045 07/29/22 0905 1April 16, 2023 0959  NA 134* 137 134*  K 4.9 4.2 4.7  CL 103 103 101  CO2 '25 25 22  '$ GLUCOSE 181* 164* 174*  BUN 24* 24* 22  CREATININE 0.94 1.02 1.05  CALCIUM 8.8* 9.3 9.2   Recent Labs    06/19/22 1045 07/29/22 0905 1April 16, 2023 0959  AST '25 21 24  '$ ALT '22 23 21  '$ ALKPHOS 82 86 79  BILITOT 0.6 0.9 0.9  PROT 7.0 7.2 6.9  ALBUMIN 3.9 3.9 3.8   Recent Labs    06/19/22 1045 07/29/22 0905 08/06/22 1112 1April 16, 2023 0959  WBC 8.0 6.4 8.9 9.4  NEUTROABS 5.4 3.6  --  6.5  HGB 13.7 14.1 13.6 13.2  HCT 41.4 43.1 40.8 41.1  MCV 84.3 82.1 82.6 83.4  PLT 264 284 296 326   Lab Results  Component Value Date   TSH 2.332 12023-12-15   Lab Results  Component Value Date   HGBA1C 7.2 (H) 07/29/2022   Lab Results  Component Value Date   CHOL 173 07/30/2022   HDL 31 (L) 07/30/2022   LDLCALC 90 07/30/2022   TRIG 260 (H) 07/30/2022   CHOLHDL 5.6 07/30/2022    Significant Diagnostic Results in last 30 days:  No results found.  Assessment/Plan Basal cell carcinoma (BCC) of scalp S/p removal of basal cell lesion. No drainage today. Discussed signs of infection with patient and  spouse. Will continue to monitor. Request nursing contact dermatology to determine whether staples can be removed in the facility or if he should wait until appointment in 5 days. Received notice from derm they could get him in sooner, Monday 1/22. Will have nursin call and schedule.    Family/ staff Communication: nursing, spouse  Labs/tests ordered:  none.

## 2022-10-26 ENCOUNTER — Ambulatory Visit: Payer: Medicare Other

## 2022-11-02 ENCOUNTER — Ambulatory Visit: Payer: Medicare Other

## 2022-11-06 ENCOUNTER — Encounter: Payer: Self-pay | Admitting: Student

## 2022-11-06 ENCOUNTER — Other Ambulatory Visit: Payer: Self-pay | Admitting: Nurse Practitioner

## 2022-11-06 MED ORDER — TRAMADOL HCL 50 MG PO TABS: 50.0000 mg | ORAL_TABLET | Freq: Four times a day (QID) | ORAL | 0 refills | Status: DC | PRN

## 2022-11-06 NOTE — Progress Notes (Signed)
Patient is having worsening headaches and not being managed with tylenol. Hospice spoke with family and requesting tramadol. Rx sent to pharmacy.

## 2022-11-09 ENCOUNTER — Ambulatory Visit: Payer: Medicare Other

## 2022-11-14 ENCOUNTER — Non-Acute Institutional Stay (SKILLED_NURSING_FACILITY): Payer: Medicare Other | Admitting: Student

## 2022-11-14 ENCOUNTER — Encounter: Payer: Self-pay | Admitting: Student

## 2022-11-14 DIAGNOSIS — C7931 Secondary malignant neoplasm of brain: Secondary | ICD-10-CM

## 2022-11-14 DIAGNOSIS — N1831 Chronic kidney disease, stage 3a: Secondary | ICD-10-CM | POA: Diagnosis not present

## 2022-11-14 DIAGNOSIS — C7802 Secondary malignant neoplasm of left lung: Secondary | ICD-10-CM | POA: Diagnosis not present

## 2022-11-14 DIAGNOSIS — R519 Headache, unspecified: Secondary | ICD-10-CM

## 2022-11-14 DIAGNOSIS — E039 Hypothyroidism, unspecified: Secondary | ICD-10-CM

## 2022-11-14 DIAGNOSIS — E1149 Type 2 diabetes mellitus with other diabetic neurological complication: Secondary | ICD-10-CM

## 2022-11-14 DIAGNOSIS — E271 Primary adrenocortical insufficiency: Secondary | ICD-10-CM

## 2022-11-14 NOTE — Progress Notes (Signed)
Location:  Other [place].  Nursing Home Room Number: [place] of Service:  SNF [phone]) Provider:  Dr. Dewayne Shorter  PCP: Tonia Ghent, MD  Patient Care Team: Tonia Ghent, MD as PCP - General (Family Medicine) [name], MD as Referring Physician (Neurology) [name], MD as Physician Assistant (Endocrinology)  Extended Emergency Contact Information Primary Emergency Contact: Tony Huffman, Tony Huffman Mobile Phone: [phone] Relation: Spouse  Code Status:  DNR Goals of care: Advanced Directive information    11/14/2022   10:34 AM  Advanced Directives  Does Patient Have a Medical Advance Directive? Yes  Type of Paramedic of [place];Out of facility DNR (pink MOST or yellow form);Living will  Does patient want to make changes to medical advance directive? No - Patient declined  Copy of [place] in Chart? Yes - validated most recent copy scanned in chart (See row information)     Chief Complaint  Patient presents with   Acute Visit    Hospice Care    HPI:  Pt is a 77 y.o. male seen today for an acute and routine visit for concern for numbness/weakness in his right arm. Patient has also had an increase in headaches in the last few weeks. Discussed with patient and spouse concern that his symptoms are related to underlying tumor. Patient is currently on higher dose steroids and continues to have progression of symptoms. Overnight he had a lot of anxiety and called his wife at [facility] [place], no medications were given.   Per Nursing, his symptoms are transient and he almost immediately regains strength and scratches his nose.   Past Medical History:  Diagnosis Date   Adrenal insufficiency (HCC)    Anxiety    Chronic kidney disease, stage 3 (HCC) 11/13/2011   Erectile dysfunction    GERD (gastroesophageal reflux disease)    Gout 09/04/2013   H/O atrial flutter 03/07/2017   Headache    Hyperlipidemia 06/16/2014    Hypertension    Hypothyroidism (acquired) 04/14/2014   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[place]) 11/10/2010   OSA (obstructive sleep apnea) 07/24/2016   Pancreatic mass 08/01/2018   Renal carcinoma ([place]) 06/16/2015   RLS (restless legs syndrome) 07/24/2016   Sleep apnea    Testicular hypofunction    Tussive syncopes 10/15/2013   Type 2 diabetes mellitus ([place])    Vitamin D deficiency disease    Past Surgical History:  Procedure Laterality Date   ADRENALECTOMY     CATARACT EXTRACTION  10/2014   CATARACT EXTRACTION EXTRACAPSULAR  11/05/2016   with Insertion Intraocular Prostheis.    COLONOSCOPY  05/06/2017   with removal lesions by snare.   CYSTECTOMY     Upper Neck/Chest Area, [facility]   LESION EXCISION N/A 10/11/2022   Procedure: EXCISION SCALP LESION;  Surgeon: Benjamine Sprague, DO;  Location: [facility] ORS;  Service: General;  Laterality: N/A;   NEPHRECTOMY     POPLITEAL SYNOVIAL CYST [place]     patient states they only removed half   TONSILLECTOMY      Allergies  Allergen Reactions   Other Rash    Blisters with bandaids Blisters with bandaids    Tegaderm Ag Mesh [Silver]    Tape Rash    Blisters, rash. Paper tape OK. Blisters, rash. Paper tape OK.     Outpatient Encounter Medications as of 11/14/2022  Medication Sig   acetaminophen (TYLENOL) 325 MG tablet  Take 650 mg by mouth 3 (three) times daily.   Cholecalciferol (VITAMIN D3) 125 MCG (5000 UT) CAPS Take 1 capsule by mouth daily.   colchicine 0.6 MG tablet Give one tablet by mouth every 1 hour as needed for gout flare first sign of gout, take two tablets followed by one tablet within 1 hour total 1.3m within 1 hour. Give two tablets by mouth every 24 hours as needed for gout, give at first sign of gout followed one tablet within 1 hours.   cyanocobalamin 1000 MCG tablet Take 1,000 mcg by mouth every other day.   fludrocortisone (FLORINEF) 0.1 MG tablet Give one  tablet by mouth one time a day on Tue, Thurs. Sunday and Give Two tablets by mouth one time a day on Mon,Wed,Fri and Sat.   gabapentin (NEURONTIN) 300 MG capsule Take 900 mg by mouth at bedtime.   hydrocortisone (CORTEF) 10 MG tablet Take 10 mg by mouth daily. In the afternoon. Give one tablet by mouth in the evening. Give Three tablets by mouth in the morning.   levothyroxine (SYNTHROID) 75 MCG tablet Take 75 mcg by mouth daily.   LORazepam (ATIVAN) 0.5 MG tablet Take 0.5 mg by mouth every 8 (eight) hours as needed for anxiety. For Anxiety/SOB. Give one tablet by mouth every 8 hours as needed for seizure like activity/shaking   metFORMIN (GLUCOPHAGE) 500 MG tablet Take 500 mg by mouth 2 (two) times daily with a meal.   mirabegron ER (MYRBETRIQ) 50 MG TB24 tablet Take 1 tablet (50 mg total) by mouth daily.   Morphine Sulfate (MORPHINE CONCENTRATE) 10 mg / 0.5 ml concentrated solution Take 0.25 mLs by mouth every 4 (four) hours as needed for severe pain.   Multiple Vitamins-Minerals (PRESERVISION AREDS 2 PO) Take 1 tablet by mouth in the morning and at bedtime.   PSYLLIUM HUSK PO Take 3.4 g by mouth every evening.   traMADol (ULTRAM) 50 MG tablet Take 1 tablet (50 mg total) by mouth every 6 (six) hours as needed for moderate pain.   No facility-administered encounter medications on file as of 11/14/2022.    Review of Systems  Immunization History  Administered Date(s) Administered   Hepatitis B, ADULT 06/18/2016, 07/19/2016, 06/18/2017   Influenza, High Dose Seasonal PF 06/18/2017, 06/18/2018, 07/09/2022   Influenza,inj,Quad PF,6+ Mos 06/09/2014, 06/15/2015, 06/18/2016   Influenza-Unspecified 06/09/2014, 06/15/2015, 06/18/2016, 06/18/2017, 06/16/2019   Moderna Covid-19 Vaccine Bivalent Booster 18yrs & up 06/12/2021, 02/06/2022   Moderna Sars-Covid-2 Vaccination 10/13/2019, 11/10/2019, 05/17/2020, 01/02/2021   Pneumococcal Conjugate-13 05/13/2014, 05/31/2019   Pneumococcal Polysaccharide-23  05/04/2011   Tdap 06/03/2013   Zoster, Live 11/30/2007   Pertinent  Health Maintenance Due  Topic Date Due   FOOT EXAM  Never done   OPHTHALMOLOGY EXAM  Never done   HEMOGLOBIN A1C  01/28/2023   INFLUENZA VACCINE  Completed      12$ /20/2023   12:45 AM 09/19/2022   11:00 AM 09/19/2022    8:00 PM 09/20/2022    8:30 AM 10/11/2022    6:29 AM  Fall Risk  (RETIRED) Patient Fall Risk Level High fall risk High fall risk High fall risk High fall risk High fall risk   Functional Status Survey:    Vitals:   11/14/22 1026  BP: 130/80  Pulse: 73  Resp: 18  Temp: (!) 97 F (36.1 C)  SpO2: 96%  Weight: 224 lb 9.6 oz (101.9 kg)  Height: 5' 8"$  (1.727 m)   Body mass index is 34.15 kg/m. Physical Exam  Vitals reviewed.  Constitutional:      Comments: Patient resting peacefully in bed  Cardiovascular:     Rate and Rhythm: Normal rate.  Pulmonary:     Effort: Pulmonary effort is normal.  Skin:    General: Skin is warm and dry.     Findings: No erythema or rash.     Labs reviewed: Recent Labs    06/19/22 1045 07/29/22 0905 12023-08-02 0959  NA 134* 137 134*  K 4.9 4.2 4.7  CL 103 103 101  CO2 25 25 22  $ GLUCOSE 181* 164* 174*  BUN 24* 24* 22  CREATININE 0.94 1.02 1.05  CALCIUM 8.8* 9.3 9.2   Recent Labs    06/19/22 1045 07/29/22 0905 12023-08-02 0959  AST 25 21 24  $ ALT 22 23 21  $ ALKPHOS 82 86 79  BILITOT 0.6 0.9 0.9  PROT 7.0 7.2 6.9  ALBUMIN 3.9 3.9 3.8   Recent Labs    06/19/22 1045 07/29/22 0905 08/06/22 1112 12023-08-02 0959  WBC 8.0 6.4 8.9 9.4  NEUTROABS 5.4 3.6  --  6.5  HGB 13.7 14.1 13.6 13.2  HCT 41.4 43.1 40.8 41.1  MCV 84.3 82.1 82.6 83.4  PLT 264 284 296 326   Lab Results  Component Value Date   TSH 2.332 12023-05-25   Lab Results  Component Value Date   HGBA1C 7.2 (H) 07/29/2022   Lab Results  Component Value Date   CHOL 173 07/30/2022   HDL 31 (L) 07/30/2022   LDLCALC 90 07/30/2022   TRIG 260 (H) 07/30/2022   CHOLHDL 5.6 07/30/2022     Significant Diagnostic Results in last 30 days:  No results found.  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 Discussed concern for progression. Educated nursing regarding adequate treatment of symptoms with medications. Defer increasing steroids at this time. Continue efforts to maintain comfort in the setting of hospice. Patient continues to eat and drink well, will continue scheduled medications. Stress dose steroids at 2x baseline dosing. Continue Ativan and morphine for comfort measures. Tramadol 50 mg now scheduled q6 hours. Continue Cortef 10 mg and florinef 0.10m 3x/wk and 0.2 4x/wk. Continue Metformin for DM. Continue Myrbetriq for urinary symptoms. Appreciate hospice support.   Family/ staff Communication: nursing, spouse  Labs/tests ordered:  none

## 2022-11-16 ENCOUNTER — Encounter: Payer: Self-pay | Admitting: Student

## 2022-11-16 ENCOUNTER — Ambulatory Visit: Payer: Medicare Other

## 2022-11-16 ENCOUNTER — Non-Acute Institutional Stay (SKILLED_NURSING_FACILITY): Payer: Medicare Other | Admitting: Student

## 2022-11-16 DIAGNOSIS — C7931 Secondary malignant neoplasm of brain: Secondary | ICD-10-CM | POA: Diagnosis not present

## 2022-11-16 DIAGNOSIS — Z515 Encounter for palliative care: Secondary | ICD-10-CM

## 2022-11-16 NOTE — Progress Notes (Unsigned)
Location:  Other [place].  Nursing Home Room Number: [place] of Service:  [id number] [phone]) Provider:  Dr. Kearney Hard, MD  Patient Care Team: Tonia Ghent, MD as PCP - General (Family Medicine) [name], MD as Referring Physician (Neurology) [name], MD as Physician Assistant (Endocrinology)  Extended Emergency Contact Information Primary Emergency Contact: Tag, [place] Mobile Phone: [phone] Relation: Spouse  Code Status:  DNR Goals of care: Advanced Directive information    11/16/2022   10:37 AM  Advanced Directives  Does Patient Have a Medical Advance Directive? Yes  Type of Paramedic of [place];Out of facility DNR (pink MOST or yellow form);Living will  Does patient want to make changes to medical advance directive? No - Patient declined  Copy of [place] in Chart? Yes - validated most recent copy scanned in chart (See row information)     Chief Complaint  Patient presents with  . Acute Visit    Hospice Care    HPI:  Pt is a 77 y.o. male seen today for an acute visit for hospice care. Patient has had progression of symptoms in the last 48 hours. He is now having difficulty swallowing, changes in vision, as well as protrusion of the left eye. He constantly has a headache which improves with morphine. He has been increasingly confused. His wife has questions regarding the prognosis and discussed if patient continues to have changes like has in the last 48 hours it could be sooner than later. Unclear if he has had more bleeding in the brain due to the tumor.    Past Medical History:  Diagnosis Date  . Adrenal insufficiency ([place])   . Anxiety   . Chronic kidney disease, stage 3 ([place]) 11/13/2011  . Erectile dysfunction   . GERD (gastroesophageal reflux disease)   . Gout 09/04/2013  . H/O atrial flutter 03/07/2017  . Headache   . Hyperlipidemia 06/16/2014  .  Hypertension   . Hypothyroidism (acquired) 04/14/2014  . 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[place]) 11/10/2010  . OSA (obstructive sleep apnea) 07/24/2016  . Pancreatic mass 08/01/2018  . Renal carcinoma ([place]) 06/16/2015  . RLS (restless legs syndrome) 07/24/2016  . Sleep apnea   . Testicular hypofunction   . Tussive syncopes 10/15/2013  . Type 2 diabetes mellitus ([place])   . Vitamin D deficiency disease    Past Surgical History:  Procedure Laterality Date  . ADRENALECTOMY    . CATARACT EXTRACTION  10/2014  . CATARACT EXTRACTION EXTRACAPSULAR  11/05/2016   with Insertion Intraocular Prostheis.   . COLONOSCOPY  05/06/2017   with removal lesions by snare.  . CYSTECTOMY     Upper Neck/Chest Area, [facility]  . LESION EXCISION N/A 10/11/2022   Procedure: EXCISION SCALP LESION;  Surgeon: Benjamine Sprague, DO;  Location: [facility] ORS;  Service: General;  Laterality: N/A;  . NEPHRECTOMY    . POPLITEAL SYNOVIAL CYST EXCISION    . [place]  . THYROIDECTOMY     patient states they only removed half  . TONSILLECTOMY      Allergies  Allergen Reactions  . Other Rash    Blisters with bandaids Blisters with bandaids   . Tegaderm Ag Mesh [Silver]   . Tape Rash    Blisters, rash. Paper tape OK. Blisters, rash. Paper tape OK.     Outpatient Encounter Medications as of 11/16/2022  Medication Sig  . acetaminophen (TYLENOL) 325 MG  tablet Take 650 mg by mouth 3 (three) times daily.  . Cholecalciferol (VITAMIN D3) 125 MCG (5000 UT) CAPS Take 1 capsule by mouth daily.  . colchicine 0.6 MG tablet Give one tablet by mouth every 1 hour as needed for gout flare first sign of gout, take two tablets followed by one tablet within 1 hour total 1.68m within 1 hour. Give two tablets by mouth every 24 hours as needed for gout, give at first sign of gout followed one tablet within 1 hours.  . cyanocobalamin 1000 MCG tablet Take 1,000 mcg by mouth every other day.  . fludrocortisone (FLORINEF)  0.1 MG tablet Give one tablet by mouth one time a day on Tue, Thurs. Sunday and Give Two tablets by mouth one time a day on Mon,Wed,Fri and Sat.  . gabapentin (NEURONTIN) 300 MG capsule Take 900 mg by mouth at bedtime.  . hydrocortisone (CORTEF) 10 MG tablet Take 10 mg by mouth daily. In the afternoon. Give one tablet by mouth in the evening. Give Three tablets by mouth in the morning.  . levothyroxine (SYNTHROID) 75 MCG tablet Take 75 mcg by mouth daily.  . LORazepam (ATIVAN) 0.5 MG tablet Take 0.5 mg by mouth every 8 (eight) hours as needed for anxiety. For Anxiety/SOB. Give one tablet by mouth every 8 hours as needed for seizure like activity/shaking  . metFORMIN (GLUCOPHAGE) 500 MG tablet Take 500 mg by mouth 2 (two) times daily with a meal.  . mirabegron ER (MYRBETRIQ) 50 MG TB24 tablet Take 1 tablet (50 mg total) by mouth daily.  . Morphine Sulfate (MORPHINE CONCENTRATE) 10 mg / 0.5 ml concentrated solution Take 0.25 mLs by mouth every 4 (four) hours as needed for severe pain.  . Multiple Vitamins-Minerals (PRESERVISION AREDS 2 PO) Take 1 tablet by mouth in the morning and at bedtime.  . PSYLLIUM HUSK PO Take 3.4 g by mouth every evening.  . traMADol (ULTRAM) 50 MG tablet Take 1 tablet (50 mg total) by mouth every 6 (six) hours as needed for moderate pain.   No facility-administered encounter medications on file as of 11/16/2022.    Review of Systems  Immunization History  Administered Date(s) Administered  . Hepatitis B, ADULT 06/18/2016, 07/19/2016, 06/18/2017  . Influenza, High Dose Seasonal PF 06/18/2017, 06/18/2018, 07/09/2022  . Influenza,inj,Quad PF,6+ Mos 06/09/2014, 06/15/2015, 06/18/2016  . Influenza-Unspecified 06/09/2014, 06/15/2015, 06/18/2016, 06/18/2017, 06/16/2019  . Moderna Covid-19 Vaccine Bivalent Booster 18yrs & up 06/12/2021, 02/06/2022  . Moderna Sars-Covid-2 Vaccination 10/13/2019, 11/10/2019, 05/17/2020, 01/02/2021  . Pneumococcal Conjugate-13 05/13/2014,  05/31/2019  . Pneumococcal Polysaccharide-23 05/04/2011  . Tdap 06/03/2013  . Zoster, Live 11/30/2007   Pertinent  Health Maintenance Due  Topic Date Due  . FOOT EXAM  Never done  . OPHTHALMOLOGY EXAM  Never done  . HEMOGLOBIN A1C  01/28/2023  . INFLUENZA VACCINE  Completed      12$ /20/2023   12:45 AM 09/19/2022   11:00 AM 09/19/2022    8:00 PM 09/20/2022    8:30 AM 10/11/2022    6:29 AM  Fall Risk  (RETIRED) Patient Fall Risk Level High fall risk High fall risk High fall risk High fall risk High fall risk   Functional Status Survey:    Vitals:   11/16/22 1029  BP: 130/80  Pulse: 73  Resp: 18  Temp: (!) 97 F (36.1 C)  SpO2: 96%  Weight: 224 lb 9.6 oz (101.9 kg)  Height: 5' 8"$  (1.727 m)   Body mass index is 34.15 kg/m. Physical  Exam Vitals reviewed.  Neurological:     Mental Status: He is alert.    Labs reviewed: Recent Labs    06/19/22 1045 07/29/22 0905 1July 01, 2023 0959  NA 134* 137 134*  K 4.9 4.2 4.7  CL 103 103 101  CO2 25 25 22  $ GLUCOSE 181* 164* 174*  BUN 24* 24* 22  CREATININE 0.94 1.02 1.05  CALCIUM 8.8* 9.3 9.2   Recent Labs    06/19/22 1045 07/29/22 0905 1July 01, 2023 0959  AST 25 21 24  $ ALT 22 23 21  $ ALKPHOS 82 86 79  BILITOT 0.6 0.9 0.9  PROT 7.0 7.2 6.9  ALBUMIN 3.9 3.9 3.8   Recent Labs    06/19/22 1045 07/29/22 0905 08/06/22 1112 1July 01, 2023 0959  WBC 8.0 6.4 8.9 9.4  NEUTROABS 5.4 3.6  --  6.5  HGB 13.7 14.1 13.6 13.2  HCT 41.4 43.1 40.8 41.1  MCV 84.3 82.1 82.6 83.4  PLT 264 284 296 326   Lab Results  Component Value Date   TSH 2.332 107-16-23   Lab Results  Component Value Date   HGBA1C 7.2 (H) 07/29/2022   Lab Results  Component Value Date   CHOL 173 07/30/2022   HDL 31 (L) 07/30/2022   LDLCALC 90 07/30/2022   TRIG 260 (H) 07/30/2022   CHOLHDL 5.6 07/30/2022    Significant Diagnostic Results in last 30 days:  No results found.  Assessment/Plan There are no diagnoses linked to this encounter.   Family/  staff Communication: ***  Labs/tests ordered:  ***

## 2022-11-18 MED ORDER — TRAMADOL HCL 50 MG PO TABS: 50.0000 mg | ORAL_TABLET | Freq: Four times a day (QID) | ORAL | 0 refills | Status: AC

## 2022-11-20 ENCOUNTER — Telehealth: Payer: Self-pay | Admitting: Family Medicine

## 2022-11-20 ENCOUNTER — Encounter: Payer: Self-pay | Admitting: Family Medicine

## 2022-11-20 NOTE — Telephone Encounter (Signed)
I was glad to see this kind gentleman in clinic and he will be missed.

## 2022-11-21 NOTE — Telephone Encounter (Signed)
I called Tony Huffman back and thanked her for her effort and for her note.  Tony Huffman was always kind to me in clinic, even when he wasn't feeling well.  Support offered.  She thanked me for the call.

## 2022-11-23 ENCOUNTER — Ambulatory Visit: Payer: Medicare Other

## 2022-11-30 ENCOUNTER — Ambulatory Visit: Payer: Medicare Other

## 2022-11-30 DEATH — deceased

## 2022-12-07 ENCOUNTER — Ambulatory Visit: Payer: Medicare Other

## 2022-12-14 ENCOUNTER — Ambulatory Visit: Payer: Medicare Other

## 2023-05-28 ENCOUNTER — Ambulatory Visit: Payer: Medicare Other | Admitting: Urology
